# Patient Record
Sex: Male | Born: 1945
Health system: Southern US, Community
[De-identification: ages and names within clinical notes are randomized; demographics above are authoritative.]

## PROBLEM LIST (undated history)

## (undated) DIAGNOSIS — L0232 Furuncle of buttock: Secondary | ICD-10-CM

## (undated) DIAGNOSIS — I10 Essential (primary) hypertension: Secondary | ICD-10-CM

## (undated) DIAGNOSIS — Z22322 Carrier or suspected carrier of Methicillin resistant Staphylococcus aureus: Secondary | ICD-10-CM

## (undated) DIAGNOSIS — E538 Deficiency of other specified B group vitamins: Secondary | ICD-10-CM

## (undated) DIAGNOSIS — G609 Hereditary and idiopathic neuropathy, unspecified: Secondary | ICD-10-CM

## (undated) DIAGNOSIS — M1990 Unspecified osteoarthritis, unspecified site: Secondary | ICD-10-CM

## (undated) DIAGNOSIS — M549 Dorsalgia, unspecified: Secondary | ICD-10-CM

## (undated) DIAGNOSIS — Z0000 Encounter for general adult medical examination without abnormal findings: Secondary | ICD-10-CM

## (undated) DIAGNOSIS — R002 Palpitations: Secondary | ICD-10-CM

## (undated) DIAGNOSIS — G4700 Insomnia, unspecified: Secondary | ICD-10-CM

## (undated) DIAGNOSIS — G709 Myoneural disorder, unspecified: Secondary | ICD-10-CM

## (undated) DIAGNOSIS — H269 Unspecified cataract: Secondary | ICD-10-CM

## (undated) DIAGNOSIS — N400 Enlarged prostate without lower urinary tract symptoms: Secondary | ICD-10-CM

## (undated) DIAGNOSIS — Z86010 Personal history of colonic polyps: Secondary | ICD-10-CM

## (undated) DIAGNOSIS — G4730 Sleep apnea, unspecified: Secondary | ICD-10-CM

## (undated) DIAGNOSIS — I4891 Unspecified atrial fibrillation: Secondary | ICD-10-CM

## (undated) DIAGNOSIS — R109 Unspecified abdominal pain: Secondary | ICD-10-CM

## (undated) DIAGNOSIS — Q851 Tuberous sclerosis: Secondary | ICD-10-CM

## (undated) DIAGNOSIS — R079 Chest pain, unspecified: Secondary | ICD-10-CM

## (undated) DIAGNOSIS — R319 Hematuria, unspecified: Secondary | ICD-10-CM

## (undated) DIAGNOSIS — G4733 Obstructive sleep apnea (adult) (pediatric): Secondary | ICD-10-CM

## (undated) DIAGNOSIS — J309 Allergic rhinitis, unspecified: Secondary | ICD-10-CM

## (undated) DIAGNOSIS — R011 Cardiac murmur, unspecified: Secondary | ICD-10-CM

## (undated) DIAGNOSIS — I341 Nonrheumatic mitral (valve) prolapse: Secondary | ICD-10-CM

## (undated) DIAGNOSIS — E785 Hyperlipidemia, unspecified: Secondary | ICD-10-CM

## (undated) DIAGNOSIS — K222 Esophageal obstruction: Secondary | ICD-10-CM

## (undated) HISTORY — DX: Dorsalgia, unspecified: M54.9

## (undated) HISTORY — DX: Myoneural disorder, unspecified: G70.9

## (undated) HISTORY — DX: Insomnia, unspecified: G47.00

## (undated) HISTORY — PX: TONSILLECTOMY AND ADENOIDECTOMY: SUR1326

## (undated) HISTORY — DX: Unspecified cataract: H26.9

## (undated) HISTORY — DX: Nonrheumatic mitral (valve) prolapse: I34.1

## (undated) HISTORY — DX: Essential (primary) hypertension: I10

## (undated) HISTORY — DX: Furuncle of buttock: L02.32

## (undated) HISTORY — DX: Carrier or suspected carrier of methicillin resistant Staphylococcus aureus: Z22.322

## (undated) HISTORY — PX: POLYPECTOMY: SHX149

## (undated) HISTORY — PX: ATRIAL FIBRILLATION ABLATION: SHX5732

## (undated) HISTORY — PX: COLONOSCOPY: SHX174

## (undated) HISTORY — DX: Hyperlipidemia, unspecified: E78.5

## (undated) HISTORY — DX: Personal history of colonic polyps: Z86.010

## (undated) HISTORY — DX: Unspecified osteoarthritis, unspecified site: M19.90

## (undated) HISTORY — DX: Palpitations: R00.2

## (undated) HISTORY — DX: Cardiac murmur, unspecified: R01.1

## (undated) HISTORY — PX: HAND SURGERY: SHX662

## (undated) HISTORY — DX: Benign prostatic hyperplasia without lower urinary tract symptoms: N40.0

## (undated) HISTORY — DX: Esophageal obstruction: K22.2

## (undated) HISTORY — PX: UPPER GASTROINTESTINAL ENDOSCOPY: SHX188

## (undated) HISTORY — DX: Hereditary and idiopathic neuropathy, unspecified: G60.9

## (undated) HISTORY — DX: Sleep apnea, unspecified: G47.30

## (undated) HISTORY — DX: Allergic rhinitis, unspecified: J30.9

## (undated) HISTORY — DX: Encounter for general adult medical examination without abnormal findings: Z00.00

## (undated) HISTORY — DX: Obstructive sleep apnea (adult) (pediatric): G47.33

## (undated) HISTORY — DX: Unspecified atrial fibrillation: I48.91

## (undated) HISTORY — DX: Tuberous sclerosis: Q85.1

---
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
## 2004-07-30 ENCOUNTER — Encounter (INDEPENDENT_AMBULATORY_CARE_PROVIDER_SITE_OTHER): Payer: Self-pay | Admitting: Specialist

## 2004-07-30 ENCOUNTER — Ambulatory Visit (HOSPITAL_COMMUNITY): Admission: RE | Admit: 2004-07-30 | Discharge: 2004-07-30 | Payer: Self-pay | Admitting: Gastroenterology

## 2004-07-30 ENCOUNTER — Encounter: Payer: Self-pay | Admitting: Gastroenterology

## 2005-04-23 ENCOUNTER — Ambulatory Visit: Payer: Self-pay | Admitting: Family Medicine

## 2005-04-27 ENCOUNTER — Ambulatory Visit: Payer: Self-pay | Admitting: Internal Medicine

## 2006-02-15 ENCOUNTER — Ambulatory Visit: Payer: Self-pay | Admitting: Gastroenterology

## 2006-02-22 ENCOUNTER — Ambulatory Visit: Payer: Self-pay | Admitting: Gastroenterology

## 2006-07-10 ENCOUNTER — Ambulatory Visit: Payer: Self-pay | Admitting: Internal Medicine

## 2006-07-10 LAB — CONVERTED CEMR LAB: PSA: 0.82 ng/mL

## 2006-07-18 ENCOUNTER — Ambulatory Visit: Payer: Self-pay | Admitting: Internal Medicine

## 2006-12-15 ENCOUNTER — Ambulatory Visit: Payer: Self-pay | Admitting: Internal Medicine

## 2007-03-06 DIAGNOSIS — R002 Palpitations: Secondary | ICD-10-CM

## 2007-03-06 HISTORY — DX: Palpitations: R00.2

## 2007-08-17 ENCOUNTER — Ambulatory Visit: Payer: Self-pay | Admitting: Internal Medicine

## 2007-08-18 ENCOUNTER — Encounter: Payer: Self-pay | Admitting: Internal Medicine

## 2007-08-18 DIAGNOSIS — E785 Hyperlipidemia, unspecified: Secondary | ICD-10-CM

## 2007-08-18 DIAGNOSIS — I4891 Unspecified atrial fibrillation: Secondary | ICD-10-CM | POA: Insufficient documentation

## 2007-08-18 HISTORY — DX: Hyperlipidemia, unspecified: E78.5

## 2008-01-17 ENCOUNTER — Encounter: Payer: Self-pay | Admitting: Internal Medicine

## 2008-03-31 ENCOUNTER — Encounter: Payer: Self-pay | Admitting: Internal Medicine

## 2008-05-05 ENCOUNTER — Ambulatory Visit: Payer: Self-pay | Admitting: Internal Medicine

## 2008-05-05 LAB — CONVERTED CEMR LAB
ALT: 37 units/L (ref 0–53)
AST: 33 units/L (ref 0–37)
Albumin: 3.7 g/dL (ref 3.5–5.2)
Alkaline Phosphatase: 56 units/L (ref 39–117)
BUN: 16 mg/dL (ref 6–23)
Basophils Absolute: 0 10*3/uL (ref 0.0–0.1)
Bilirubin, Direct: 0.1 mg/dL (ref 0.0–0.3)
CO2: 30 meq/L (ref 19–32)
Chloride: 109 meq/L (ref 96–112)
Cholesterol: 164 mg/dL (ref 0–200)
GFR calc Af Amer: 88 mL/min
Glucose, Bld: 94 mg/dL (ref 70–99)
HDL: 32.8 mg/dL — ABNORMAL LOW (ref >39.0)
Hemoglobin: 14.5 g/dL (ref 13.0–17.0)
LDL Cholesterol: 115 mg/dL — ABNORMAL HIGH (ref 0–99)
Leukocytes, UA: NEGATIVE
MCV: 92.4 fL (ref 78.0–100.0)
Nitrite: NEGATIVE
Potassium: 4.1 meq/L (ref 3.5–5.1)
RDW: 12.6 % (ref 11.5–14.6)
Sodium: 142 meq/L (ref 135–145)
TSH: 2.11 microintl units/mL (ref 0.35–5.50)
Total Bilirubin: 0.9 mg/dL (ref 0.3–1.2)
Total CHOL/HDL Ratio: 5
Total Protein, Urine: NEGATIVE mg/dL
Total Protein: 6.7 g/dL (ref 6.0–8.3)
Urobilinogen, UA: 0.2 (ref 0.0–1.0)
VLDL: 17 mg/dL (ref 0–40)

## 2008-05-12 ENCOUNTER — Ambulatory Visit: Payer: Self-pay | Admitting: Internal Medicine

## 2008-05-12 DIAGNOSIS — G609 Hereditary and idiopathic neuropathy, unspecified: Secondary | ICD-10-CM

## 2008-05-12 DIAGNOSIS — Z860100 Personal history of colon polyps, unspecified: Secondary | ICD-10-CM | POA: Insufficient documentation

## 2008-05-12 DIAGNOSIS — J309 Allergic rhinitis, unspecified: Secondary | ICD-10-CM

## 2008-05-12 HISTORY — DX: Allergic rhinitis, unspecified: J30.9

## 2008-05-12 HISTORY — DX: Personal history of colonic polyps: Z86.010

## 2008-05-12 HISTORY — DX: Personal history of colon polyps, unspecified: Z86.0100

## 2008-05-12 HISTORY — DX: Hereditary and idiopathic neuropathy, unspecified: G60.9

## 2008-06-17 ENCOUNTER — Ambulatory Visit: Payer: Self-pay | Admitting: Internal Medicine

## 2008-06-17 LAB — CONVERTED CEMR LAB
AST: 33 units/L (ref 0–37)
Bilirubin, Direct: 0.1 mg/dL (ref 0.0–0.3)
Total Bilirubin: 0.9 mg/dL (ref 0.3–1.2)
Total CHOL/HDL Ratio: 4
Total Protein: 6.7 g/dL (ref 6.0–8.3)
Triglycerides: 60 mg/dL (ref 0–149)

## 2008-07-28 ENCOUNTER — Encounter: Payer: Self-pay | Admitting: Internal Medicine

## 2008-07-29 ENCOUNTER — Encounter: Payer: Self-pay | Admitting: Internal Medicine

## 2008-08-18 ENCOUNTER — Encounter: Payer: Self-pay | Admitting: Internal Medicine

## 2008-08-22 ENCOUNTER — Encounter: Payer: Self-pay | Admitting: Internal Medicine

## 2008-09-08 ENCOUNTER — Encounter: Payer: Self-pay | Admitting: Internal Medicine

## 2009-03-24 ENCOUNTER — Ambulatory Visit: Payer: Self-pay | Admitting: Gastroenterology

## 2009-03-24 DIAGNOSIS — K222 Esophageal obstruction: Secondary | ICD-10-CM

## 2009-03-24 DIAGNOSIS — K219 Gastro-esophageal reflux disease without esophagitis: Secondary | ICD-10-CM

## 2009-03-24 HISTORY — DX: Gastro-esophageal reflux disease without esophagitis: K21.9

## 2009-03-24 HISTORY — DX: Esophageal obstruction: K22.2

## 2009-05-12 ENCOUNTER — Ambulatory Visit: Payer: Self-pay | Admitting: Internal Medicine

## 2009-05-12 LAB — CONVERTED CEMR LAB
Albumin: 4 g/dL (ref 3.5–5.2)
Alkaline Phosphatase: 52 units/L (ref 39–117)
Basophils Relative: 0.4 % (ref 0.0–3.0)
Bilirubin Urine: NEGATIVE
Chloride: 112 meq/L (ref 96–112)
Cholesterol: 186 mg/dL (ref 0–200)
Eosinophils Absolute: 0.2 10*3/uL (ref 0.0–0.7)
GFR calc non Af Amer: 72.09 mL/min (ref >60)
HCT: 45.1 % (ref 39.0–52.0)
HDL: 41.1 mg/dL (ref >39.00)
LDL Cholesterol: 132 mg/dL — ABNORMAL HIGH (ref 0–99)
Leukocytes, UA: NEGATIVE
Lymphocytes Relative: 36 % (ref 12.0–46.0)
Lymphs Abs: 1.4 10*3/uL (ref 0.7–4.0)
MCV: 90 fL (ref 78.0–100.0)
Monocytes Absolute: 0.5 10*3/uL (ref 0.1–1.0)
Neutrophils Relative %: 44.8 % (ref 43.0–77.0)
PSA: 0.61 ng/mL (ref 0.10–4.00)
RDW: 13 % (ref 11.5–14.6)
Specific Gravity, Urine: 1.015 (ref 1.000–1.030)
TSH: 1.73 microintl units/mL (ref 0.35–5.50)
Total Bilirubin: 0.9 mg/dL (ref 0.3–1.2)
Total CHOL/HDL Ratio: 5
Total Protein, Urine: NEGATIVE mg/dL
Urine Glucose: NEGATIVE mg/dL
Urobilinogen, UA: 0.2 (ref 0.0–1.0)
pH: 6 (ref 5.0–8.0)

## 2009-05-14 ENCOUNTER — Ambulatory Visit: Payer: Self-pay | Admitting: Internal Medicine

## 2009-05-14 DIAGNOSIS — G4700 Insomnia, unspecified: Secondary | ICD-10-CM | POA: Insufficient documentation

## 2009-05-14 HISTORY — DX: Insomnia, unspecified: G47.00

## 2009-06-01 ENCOUNTER — Encounter: Payer: Self-pay | Admitting: Internal Medicine

## 2009-06-15 ENCOUNTER — Ambulatory Visit: Payer: Self-pay | Admitting: Internal Medicine

## 2009-06-15 LAB — CONVERTED CEMR LAB: Triglycerides: 73 mg/dL (ref 0.0–149.0)

## 2009-07-10 ENCOUNTER — Ambulatory Visit: Payer: Self-pay | Admitting: Internal Medicine

## 2009-08-24 ENCOUNTER — Encounter: Payer: Self-pay | Admitting: Internal Medicine

## 2009-09-01 ENCOUNTER — Encounter: Payer: Self-pay | Admitting: Internal Medicine

## 2009-10-15 ENCOUNTER — Encounter: Payer: Self-pay | Admitting: Internal Medicine

## 2010-02-10 ENCOUNTER — Encounter: Payer: Self-pay | Admitting: Internal Medicine

## 2010-03-10 ENCOUNTER — Encounter: Payer: Self-pay | Admitting: Internal Medicine

## 2010-03-13 ENCOUNTER — Emergency Department (HOSPITAL_COMMUNITY): Admission: EM | Admit: 2010-03-13 | Discharge: 2010-03-13 | Payer: Self-pay | Admitting: Emergency Medicine

## 2010-03-15 ENCOUNTER — Encounter: Payer: Self-pay | Admitting: Internal Medicine

## 2010-03-15 DIAGNOSIS — I4891 Unspecified atrial fibrillation: Secondary | ICD-10-CM

## 2010-03-15 HISTORY — DX: Unspecified atrial fibrillation: I48.91

## 2010-04-12 ENCOUNTER — Encounter: Payer: Self-pay | Admitting: Cardiovascular Disease

## 2010-05-05 ENCOUNTER — Ambulatory Visit: Payer: Self-pay | Admitting: Internal Medicine

## 2010-05-05 LAB — CONVERTED CEMR LAB
ALT: 27 units/L (ref 0–53)
BUN: 19 mg/dL (ref 6–23)
Bilirubin, Direct: 0.1 mg/dL (ref 0.0–0.3)
CO2: 30 meq/L (ref 19–32)
Calcium: 9.4 mg/dL (ref 8.4–10.5)
Chloride: 108 meq/L (ref 96–112)
Creatinine, Ser: 1.1 mg/dL (ref 0.4–1.5)
Eosinophils Absolute: 0.2 10*3/uL (ref 0.0–0.7)
GFR calc non Af Amer: 74.19 mL/min (ref >60)
HCT: 43.7 % (ref 39.0–52.0)
HDL: 39.2 mg/dL (ref >39.00)
LDL Cholesterol: 103 mg/dL — ABNORMAL HIGH (ref 0–99)
Lymphocytes Relative: 39.8 % (ref 12.0–46.0)
Lymphs Abs: 1.7 10*3/uL (ref 0.7–4.0)
MCHC: 34.7 g/dL (ref 30.0–36.0)
MCV: 89.9 fL (ref 78.0–100.0)
Monocytes Relative: 12.8 % — ABNORMAL HIGH (ref 3.0–12.0)
Neutrophils Relative %: 42.8 % — ABNORMAL LOW (ref 43.0–77.0)
PSA: 1.05 ng/mL (ref 0.10–4.00)
Platelets: 240 10*3/uL (ref 150.0–400.0)
Potassium: 4.8 meq/L (ref 3.5–5.1)
Specific Gravity, Urine: 1.015 (ref 1.000–1.030)
Total Bilirubin: 0.8 mg/dL (ref 0.3–1.2)
Total CHOL/HDL Ratio: 4
Total Protein, Urine: NEGATIVE mg/dL

## 2010-05-06 LAB — CONVERTED CEMR LAB: Vit D, 25-Hydroxy: 58 ng/mL (ref 30–89)

## 2010-05-24 ENCOUNTER — Encounter: Payer: Self-pay | Admitting: Cardiovascular Disease

## 2010-06-02 ENCOUNTER — Encounter: Payer: Self-pay | Admitting: Internal Medicine

## 2010-08-13 ENCOUNTER — Telehealth (INDEPENDENT_AMBULATORY_CARE_PROVIDER_SITE_OTHER): Payer: Self-pay | Admitting: *Deleted

## 2011-01-05 NOTE — Letter (Signed)
Summary: [facility]   Imported By: Sherian Rein 02/15/2010 08:35:36  _____________________________________________________________________  External Attachment:    Type:   Image     Comment:   External Document

## 2011-01-05 NOTE — Letter (Signed)
Summary: [facility]  [facility]   Imported By: Lanelle Bal 03/19/2010 11:36:53  _____________________________________________________________________  External Attachment:    Type:   Image     Comment:   External Document

## 2011-01-05 NOTE — Letter (Signed)
Summary: [facility]   Imported By: Sherian Rein 06/16/2010 14:16:13  _____________________________________________________________________  External Attachment:    Type:   Image     Comment:   External Document

## 2011-01-05 NOTE — Progress Notes (Signed)
  Phone Note Other Incoming   Request: Send information Summary of Call: Request for records received from [facility]. Request forwarded to [facility].     

## 2011-01-05 NOTE — Assessment & Plan Note (Signed)
Summary: 1 YR FU  $50   STC   Vital Signs:  Patient profile:   65 year old male Height:      77.5 inches Weight:      242.75 pounds BMI:     28.52 O2 Sat:      95 % on Room air Temp:     96.9 degrees F oral Pulse rate:   48 / minute BP sitting:   124 / 82  (left arm) Cuff size:   large  Vitals Entered ByZella Ball Ewing (May 05, 2010 9:59 AM)  O2 Flow:  Room air  CC: Adult Physical/RE   Primary Care Provider:  Bayard Males  CC:  Adult Physical/RE.  History of Present Illness: here for yearly exam, stopped the lovastatin in favor of the red yeast rice;  overall wt stable;  Pt denies CP, sob, doe, wheezing, orthopnea, pnd, worsening LE edema, palps, dizziness or syncope  Pt denies new neuro symptoms such as headache, facial or extremity weakness    Problems Prior to Update: 1)  Preventive Health Care  (ICD-V70.0) 2)  Insomnia-sleep Disorder-unspec  (ICD-780.52) 3)  Esophageal Stricture  (ICD-530.3) 4)  Gerd  (ICD-530.81) 5)  Personal Hx Colonic Polyps  (ICD-V12.72) 6)  Colonic Polyps, Hx of  (ICD-V12.72) 7)  Allergic Rhinitis  (ICD-477.9) 8)  Peripheral Neuropathy  (ICD-356.9) 9)  Preventive Health Care  (ICD-V70.0) 10)  Hyperlipidemia  (ICD-272.4) 11)  Fibrillation, Atrial  (ICD-427.31)  Medications Prior to Update: 1)  Zolpidem Tartrate 10 Mg Tabs (Zolpidem Tartrate) .Marland Kitchen.. 1 By Mouth At Bedtime As Needed 2)  Ecotrin 325 Mg  Tbec (Aspirin) .Marland Kitchen.. 1 By Mouth Qd 3)  Naprelan 500 Mg Xr24h-Tab (Naproxen Sodium) .Marland Kitchen.. 1 Tablet By Mouth Once Daily 4)  Aciphex 20 Mg Tbec (Rabeprazole Sodium) .... One Tablet By Mouth Once Daily 5)  Lovastatin 20 Mg Tabs (Lovastatin) .Marland Kitchen.. 1po Once Daily  Current Medications (verified): 1)  Zolpidem Tartrate 10 Mg Tabs (Zolpidem Tartrate) .Marland Kitchen.. 1 By Mouth At Bedtime As Needed 2)  Ecotrin 325 Mg  Tbec (Aspirin) .Marland Kitchen.. 1 By Mouth Qd 3)  Naprelan 500 Mg Xr24h-Tab (Naproxen Sodium) .Marland Kitchen.. 1 Tablet By Mouth Once Daily 4)  Aciphex 20 Mg Tbec (Rabeprazole  Sodium) .... One Tablet By Mouth Once Daily 5)  Metoprolol Succinate 50 Mg Xr24h-Tab (Metoprolol Succinate) .Marland Kitchen.. 1 and 1/2 By Mouth Once Daily 6)  Red Yeast Rice 600 Mg Tabs (Red Yeast Rice Extract)  Allergies (verified): 1)  * Pravastatin  Past History:  Past Medical History: Last updated: 05/14/2009 Atrial Fibrillation - paroxysmal - asa only per [facility] EP Hyperlipidemia c-spine disc disease with cervical radicultiis/DJD - ramos Peripheral neuropathy Allergic rhinitis esophageal stricture s/p dilation 2005 symptomatic PAC's mitral valve prolapse with mild MR diastoic dysfunction Adenomatous Colon Polyps 2002 lumbar spondylolisthesis/re recurrent pain - dr gross  Past Surgical History: Last updated: 05/14/2009 Tonsillectomy s/p lasik eye surgury [facility] joint repair Nov 10th 2009 - left hand  Family History: Last updated: 03/24/2009 father with heart disease sister with cancer( Melanoma) grandfather with stroke No FH of Colon Cancer:  Social History: Last updated: 03/24/2009 semi-retired - [employer], former self employed [employer] Married 3 children plus one adopted Never Smoked Alcohol use-yes occ Illicit Drug Use - no  Risk Factors: Smoking Status: never (05/12/2008)  Family History: Reviewed history from 03/24/2009 and no changes required. father with heart disease sister with cancer( Melanoma) grandfather with stroke No FH of Colon Cancer:  Social History: Reviewed  history from 03/24/2009 and no changes required. semi-retired - [employer], former self employed [employer] Married 3 children plus one adopted Never Smoked Alcohol use-yes occ Illicit Drug Use - no  Review of Systems  The patient denies anorexia, fever, weight gain, vision loss, decreased hearing, hoarseness, chest pain, syncope, dyspnea on exertion, peripheral edema, prolonged cough, headaches, hemoptysis, abdominal pain, melena, hematochezia, severe indigestion/heartburn,  hematuria, muscle weakness, suspicious skin lesions, transient blindness, difficulty walking, depression, unusual weight change, abnormal bleeding, enlarged lymph nodes, and angioedema.         all otherwise negative per pt -    Physical Exam  General:  alert and overweight-appearing.   Head:  normocephalic and atraumatic.   Eyes:  vision grossly intact, pupils equal, and pupils round.   Ears:  R ear normal and L ear normal.   Nose:  no external deformity and no nasal discharge.   Mouth:  no gingival abnormalities and pharynx pink and moist.   Neck:  supple and no masses.   Lungs:  normal respiratory effort and normal breath sounds.   Heart:  normal rate and regular rhythm.   Abdomen:  soft, non-tender, and normal bowel sounds.   Msk:  no joint tenderness and no joint swelling.   Extremities:  no edema, no erythema  Neurologic:  cranial nerves II-XII intact and strength normal in all extremities.     Impression & Recommendations:  Problem # 1:  Preventive Health Care (ICD-V70.0)  Overall doing well, age appropriate education and counseling updated and referral for appropriate preventive services done unless declined, immunizations up to date or declined, diet counseling done if overweight, urged to quit smoking if smokes , most recent labs reviewed and current ordered if appropriate, ecg reviewed or declined (interpretation per ECG scanned in the EMR if done); information regarding Medicare Prevention requirements given if appropriate; speciality referrals updated as appropriate   Orders: EKG w/ Interpretation (93000) T-Vitamin D (25-Hydroxy) (60454-09811) TLB-BMP (Basic Metabolic Panel-BMET) (80048-METABOL) TLB-CBC Platelet - w/Differential (85025-CBCD) TLB-Hepatic/Liver Function Pnl (80076-HEPATIC) TLB-Lipid Panel (80061-LIPID) TLB-TSH (Thyroid Stimulating Hormone) (84443-TSH) TLB-PSA (Prostate Specific Antigen) (84153-PSA) TLB-Udip ONLY (81003-UDIP)  Complete Medication  List: 1)  Zolpidem Tartrate 10 Mg Tabs (Zolpidem tartrate) .Marland Kitchen.. 1 by mouth at bedtime as needed 2)  Ecotrin 325 Mg Tbec (Aspirin) .Marland Kitchen.. 1 by mouth qd 3)  Naprelan 500 Mg Xr24h-tab (Naproxen sodium) .Marland Kitchen.. 1 tablet by mouth once daily 4)  Aciphex 20 Mg Tbec (Rabeprazole sodium) .... One tablet by mouth once daily 5)  Metoprolol Succinate 50 Mg Xr24h-tab (Metoprolol succinate) .Marland Kitchen.. 1 and 1/2 by mouth once daily 6)  Red Yeast Rice 600 Mg Tabs (Red yeast rice extract)  Patient Instructions: 1)  please fax your copy of the most recent LIPIDS to [phone] 2)  Please go to the Lab in the basement for your blood and/or urine tests today 3)  Continue all previous medications as before this visit  4)  Please schedule a follow-up appointment in 1 year or sooner if needed Prescriptions: ACIPHEX 20 MG TBEC (RABEPRAZOLE SODIUM) one tablet by mouth once daily  #30 x 11   Entered and Authorized by:   Corwin Levins MD   Signed by:   Corwin Levins MD on 05/05/2010   Method used:   Print then Give to Patient   RxID:   [id number] ZOLPIDEM TARTRATE 10 MG TABS (ZOLPIDEM TARTRATE) 1 by mouth at bedtime as needed  #30 x 5   Entered and Authorized  by:   Corwin Levins MD   Signed by:   Corwin Levins MD on 05/05/2010   Method used:   Print then Give to Patient   RxID:   [id number]

## 2011-01-10 ENCOUNTER — Encounter: Payer: Self-pay | Admitting: Internal Medicine

## 2011-01-17 ENCOUNTER — Other Ambulatory Visit: Payer: Self-pay | Admitting: Internal Medicine

## 2011-01-17 ENCOUNTER — Encounter (INDEPENDENT_AMBULATORY_CARE_PROVIDER_SITE_OTHER): Payer: Self-pay | Admitting: *Deleted

## 2011-01-17 ENCOUNTER — Other Ambulatory Visit: Payer: Self-pay

## 2011-01-17 DIAGNOSIS — I4891 Unspecified atrial fibrillation: Secondary | ICD-10-CM

## 2011-01-17 DIAGNOSIS — Z8249 Family history of ischemic heart disease and other diseases of the circulatory system: Secondary | ICD-10-CM

## 2011-01-17 LAB — COMPREHENSIVE METABOLIC PANEL
ALT: 24 U/L (ref 0–53)
Albumin: 3.9 g/dL (ref 3.5–5.2)
BUN: 21 mg/dL (ref 6–23)
CO2: 27 mEq/L (ref 19–32)
Calcium: 9.1 mg/dL (ref 8.4–10.5)
Creatinine, Ser: 1.1 mg/dL (ref 0.4–1.5)
Glucose, Bld: 87 mg/dL (ref 70–99)
Potassium: 4.3 mEq/L (ref 3.5–5.1)
Total Protein: 6.6 g/dL (ref 6.0–8.3)

## 2011-01-17 LAB — TSH: TSH: 1.67 u[IU]/mL (ref 0.35–5.50)

## 2011-01-17 LAB — CBC WITH DIFFERENTIAL/PLATELET
Basophils Absolute: 0 10*3/uL (ref 0.0–0.1)
Eosinophils Absolute: 0.2 10*3/uL (ref 0.0–0.7)
Eosinophils Relative: 5.1 % — ABNORMAL HIGH (ref 0.0–5.0)
HCT: 43.5 % (ref 39.0–52.0)
Lymphs Abs: 1.6 10*3/uL (ref 0.7–4.0)
MCHC: 34.3 g/dL (ref 30.0–36.0)
MCV: 90.3 fl (ref 78.0–100.0)
Neutro Abs: 1.5 10*3/uL (ref 1.4–7.7)
Neutrophils Relative %: 38.5 % — ABNORMAL LOW (ref 43.0–77.0)
RDW: 14.2 % (ref 11.5–14.6)

## 2011-01-17 LAB — LIPID PANEL
Cholesterol: 130 mg/dL (ref 0–200)
LDL Cholesterol: 77 mg/dL (ref 0–99)
VLDL: 18.4 mg/dL (ref 0.0–40.0)

## 2011-02-01 NOTE — Letter (Signed)
Summary: [facility]  [facility]   Imported By: Lennie Odor 01/26/2011 12:53:14  _____________________________________________________________________  External Attachment:    Type:   Image     Comment:   External Document

## 2011-02-23 LAB — POCT I-STAT, CHEM 8
BUN: 22 mg/dL (ref 6–23)
HCT: 51 % (ref 39.0–52.0)
Potassium: 4.4 mEq/L (ref 3.5–5.1)
Sodium: 141 mEq/L (ref 135–145)
TCO2: 27 mmol/L (ref 0–100)

## 2011-02-24 ENCOUNTER — Encounter: Payer: Self-pay | Admitting: Gastroenterology

## 2011-03-03 NOTE — Letter (Signed)
Summary: Colonoscopy Letter  [facility]  [address] [place], [place] [address]   Phone: [phone]  Fax: [phone]      February 24, 2011 MRN: [id number]   Dustin Berry [address] [place], [place]  [address]   Dear Mr. Ruehl,   According to your medical record, it is time for you to schedule a Colonoscopy. The American Cancer Society recommends this procedure as a method to detect early colon cancer. Patients with a family history of colon cancer, or a personal history of colon polyps or inflammatory bowel disease are at increased risk.  This letter has been generated based on the recommendations made at the time of your procedure. If you feel that in your particular situation this may no longer apply, please contact our office.  Please call our office at [phone] to schedule this appointment or to update your records at your earliest convenience.  Thank you for cooperating with [place] to provide you with the very best care possible.   Sincerely,  Judie Petit T. Russella Dar, M.D.  [facility] [phone]

## 2011-06-16 ENCOUNTER — Telehealth: Payer: Self-pay | Admitting: *Deleted

## 2011-06-16 NOTE — Telephone Encounter (Signed)
[facility] previsit for 06/17/11

## 2011-06-17 ENCOUNTER — Ambulatory Visit (AMBULATORY_SURGERY_CENTER): Payer: Private Health Insurance - Indemnity | Admitting: *Deleted

## 2011-06-17 VITALS — Ht 78.0 in | Wt 245.0 lb

## 2011-06-17 DIAGNOSIS — Z86010 Personal history of colonic polyps: Secondary | ICD-10-CM

## 2011-06-17 MED ORDER — PEG-KCL-NACL-NASULF-NA ASC-C 100 G PO SOLR: ORAL | Status: DC

## 2011-06-24 ENCOUNTER — Encounter: Payer: Self-pay | Admitting: Internal Medicine

## 2011-06-24 ENCOUNTER — Ambulatory Visit (INDEPENDENT_AMBULATORY_CARE_PROVIDER_SITE_OTHER): Payer: Private Health Insurance - Indemnity | Admitting: Internal Medicine

## 2011-06-24 VITALS — BP 98/64 | HR 78 | Temp 98.2°F | Ht 78.0 in | Wt 249.5 lb

## 2011-06-24 DIAGNOSIS — K219 Gastro-esophageal reflux disease without esophagitis: Secondary | ICD-10-CM

## 2011-06-24 DIAGNOSIS — Z0001 Encounter for general adult medical examination with abnormal findings: Secondary | ICD-10-CM | POA: Insufficient documentation

## 2011-06-24 DIAGNOSIS — L0231 Cutaneous abscess of buttock: Secondary | ICD-10-CM | POA: Insufficient documentation

## 2011-06-24 DIAGNOSIS — Z0000 Encounter for general adult medical examination without abnormal findings: Secondary | ICD-10-CM

## 2011-06-24 DIAGNOSIS — L0232 Furuncle of buttock: Secondary | ICD-10-CM

## 2011-06-24 HISTORY — DX: Encounter for general adult medical examination without abnormal findings: Z00.00

## 2011-06-24 HISTORY — DX: Furuncle of buttock: L02.32

## 2011-06-24 MED ORDER — DOXYCYCLINE HYCLATE 100 MG PO TABS: 100.0000 mg | ORAL_TABLET | Freq: Two times a day (BID) | ORAL | Status: AC

## 2011-06-24 NOTE — Patient Instructions (Addendum)
Take all new medications as prescribed Continue all other medications as before Please call if worsens for referral to general surgury on Monday (more pain, red, swelling, drainage, fever)

## 2011-06-24 NOTE — Progress Notes (Signed)
Subjective:    Patient ID: Dustin Berry, male    DOB: 09-Dec-1945, 65 y.o.   MRN: [id number]  HPI  Here for acute visit - c/o 3-4 day grad and radily worse pain, red, sweling to left buttock, with drainage yesterday with manual expression, none today,  No f/c, some improved today overall but still marked discomfort to sit.  Pt denies chest pain, increased sob or doe, wheezing, orthopnea, PND, increased LE swelling, palpitations, dizziness or syncope. Pt denies polydipsia, polyuria,  Past Medical History  Diagnosis Date  . Neuromuscular disorder     peripheral neuropathy  . Atrial fibrillation   . ALLERGIC RHINITIS 05/12/2008  . ESOPHAGEAL STRICTURE 03/24/2009  . HYPERLIPIDEMIA 08/18/2007  . INSOMNIA-SLEEP DISORDER-UNSPEC 05/14/2009  . PERIPHERAL NEUROPATHY 05/12/2008  . Personal history of colonic polyps 05/12/2008  . Preventative health care 06/24/2011   Past Surgical History  Procedure Date  . Hand surgery     Thumb joint repair  . Tonsillectomy and adenoidectomy   . Upper gastrointestinal endoscopy     dilation  . Colonoscopy   . Polypectomy     reports that he has never smoked. He has never used smokeless tobacco. He reports that he drinks about .6 ounces of alcohol per week. His drug history not on file. family history includes Cancer in his sister; Heart disease in his father; and Stroke in his maternal grandfather. No Known Allergies Current Outpatient Prescriptions on File Prior to Visit  Medication Sig Dispense Refill  . Ascorbic Acid (VITAMIN C WITH ROSE HIPS) 500 MG tablet Take 500 mg by mouth daily.        Marland Kitchen aspirin 325 MG tablet Take 325 mg by mouth daily.        . cholecalciferol (VITAMIN D) 1000 UNITS tablet Take 1,000 Units by mouth daily.        Marland Kitchen co-enzyme Q-10 50 MG capsule Take 50 mg by mouth daily.        . metoprolol succinate (TOPROL-XL) 25 MG 24 hr tablet Take 25 mg by mouth daily.        . peg 3350 powder (MOVIPREP) 100 G SOLR MOVI PREP take as directed  1 kit   0  . predniSONE (DELTASONE) 10 MG tablet Take 1 tablet by mouth Daily. As directed for rash tapering dose      . Probiotic Product (PROBIOTIC & ACIDOPHILUS EX ST PO) Take 1 tablet by mouth daily.        . vitamin B-12 (CYANOCOBALAMIN) 100 MCG tablet Take 50 mcg by mouth daily.        . fish oil-omega-3 fatty acids 1000 MG capsule Take 1 g by mouth daily.         Review of Systems Review of Systems  Constitutional: Negative for diaphoresis and unexpected weight change.  HENT: Negative for drooling and tinnitus.   Eyes: Negative for photophobia and visual disturbance.  Respiratory: Negative for choking and stridor.     Objective:   Physical Exam BP 98/64  Pulse 78  Temp(Src) 98.2 F (36.8 C) (Oral)  Ht 6\' 6"  (1.981 m)  Wt 249 lb 8 oz (113.172 kg)  BMI 28.83 kg/m2  SpO2 94% Physical Exam  VS noted Constitutional: Pt appears well-developed and well-nourished.  HENT: Head: Normocephalic.  Right Ear: External ear normal.  Left Ear: External ear normal.  Eyes: Conjunctivae and EOM are normal. Pupils are equal, round, and reactive to light.  Neck: Normal range of motion. Neck supple.  Cardiovascular: Normal  rate and regular rhythm.   Pulmonary/Chest: Effort normal and breath sounds normal.  Abd:  Soft, NT, non-distended, + BS Neurological: Pt is alert. No cranial nerve deficit.  Skin: Skin is warm. No erythema. except left buttock 1 cm area induration, red, tedner, swelling without fluctuance or drainage Psychiatric: Pt behavior is normal. Thought content normal.         Assessment & Plan:

## 2011-06-24 NOTE — Assessment & Plan Note (Signed)
Mild to mod, for antibx course,  to f/u any worsening symptoms or concerns 

## 2011-06-27 ENCOUNTER — Other Ambulatory Visit: Payer: Private Health Insurance - Indemnity

## 2011-06-27 ENCOUNTER — Other Ambulatory Visit: Payer: Self-pay | Admitting: Internal Medicine

## 2011-06-27 DIAGNOSIS — Z1289 Encounter for screening for malignant neoplasm of other sites: Secondary | ICD-10-CM

## 2011-06-27 DIAGNOSIS — Z0000 Encounter for general adult medical examination without abnormal findings: Secondary | ICD-10-CM

## 2011-06-30 ENCOUNTER — Ambulatory Visit (AMBULATORY_SURGERY_CENTER): Payer: Private Health Insurance - Indemnity | Admitting: Gastroenterology

## 2011-06-30 ENCOUNTER — Encounter: Payer: Self-pay | Admitting: Gastroenterology

## 2011-06-30 DIAGNOSIS — Z1211 Encounter for screening for malignant neoplasm of colon: Secondary | ICD-10-CM

## 2011-06-30 DIAGNOSIS — K648 Other hemorrhoids: Secondary | ICD-10-CM

## 2011-06-30 DIAGNOSIS — Z86010 Personal history of colonic polyps: Secondary | ICD-10-CM

## 2011-06-30 MED ORDER — SODIUM CHLORIDE 0.9 % IV SOLN: 500.0000 mL | INTRAVENOUS | Status: DC

## 2011-06-30 NOTE — Patient Instructions (Signed)
NORMAL COLONOSCOPY- REPEAT EXAM IN 5 YEARS- WE WILL MAIL YOU A LETTER TO REMIND YOU OF THIS  INTERNAL HEMORRHOID HAND OUT GIVEN  DISCHARGE INSTRUCTIONS GIVEN PER BLUE AND GREEN SHEETS PER DR Russella Dar

## 2011-07-01 ENCOUNTER — Telehealth: Payer: Self-pay

## 2011-07-01 NOTE — Telephone Encounter (Signed)
Left message on answering machine. 

## 2011-07-04 ENCOUNTER — Other Ambulatory Visit (INDEPENDENT_AMBULATORY_CARE_PROVIDER_SITE_OTHER): Payer: Private Health Insurance - Indemnity

## 2011-07-04 ENCOUNTER — Encounter: Payer: Self-pay | Admitting: Internal Medicine

## 2011-07-04 ENCOUNTER — Ambulatory Visit (INDEPENDENT_AMBULATORY_CARE_PROVIDER_SITE_OTHER): Payer: Private Health Insurance - Indemnity | Admitting: Internal Medicine

## 2011-07-04 VITALS — BP 100/70 | HR 56 | Temp 97.8°F | Ht 78.0 in | Wt 243.5 lb

## 2011-07-04 DIAGNOSIS — Z1289 Encounter for screening for malignant neoplasm of other sites: Secondary | ICD-10-CM

## 2011-07-04 DIAGNOSIS — R5383 Other fatigue: Secondary | ICD-10-CM

## 2011-07-04 DIAGNOSIS — G4733 Obstructive sleep apnea (adult) (pediatric): Secondary | ICD-10-CM

## 2011-07-04 DIAGNOSIS — L0231 Cutaneous abscess of buttock: Secondary | ICD-10-CM

## 2011-07-04 DIAGNOSIS — Z0000 Encounter for general adult medical examination without abnormal findings: Secondary | ICD-10-CM

## 2011-07-04 DIAGNOSIS — I341 Nonrheumatic mitral (valve) prolapse: Secondary | ICD-10-CM | POA: Insufficient documentation

## 2011-07-04 HISTORY — DX: Nonrheumatic mitral (valve) prolapse: I34.1

## 2011-07-04 HISTORY — DX: Obstructive sleep apnea (adult) (pediatric): G47.33

## 2011-07-04 HISTORY — DX: Other fatigue: R53.83

## 2011-07-04 LAB — HEPATIC FUNCTION PANEL
AST: 26 U/L (ref 0–37)
Alkaline Phosphatase: 53 U/L (ref 39–117)
Bilirubin, Direct: 0.1 mg/dL (ref 0.0–0.3)
Total Protein: 6.8 g/dL (ref 6.0–8.3)

## 2011-07-04 LAB — CBC WITH DIFFERENTIAL/PLATELET
Basophils Absolute: 0 10*3/uL (ref 0.0–0.1)
Basophils Relative: 0.7 % (ref 0.0–3.0)
Eosinophils Absolute: 0.3 10*3/uL (ref 0.0–0.7)
MCHC: 33.2 g/dL (ref 30.0–36.0)
MCV: 91.7 fl (ref 78.0–100.0)
Monocytes Absolute: 0.5 10*3/uL (ref 0.1–1.0)
Neutro Abs: 1.6 10*3/uL (ref 1.4–7.7)
Neutrophils Relative %: 42 % — ABNORMAL LOW (ref 43.0–77.0)
RBC: 4.86 Mil/uL (ref 4.22–5.81)
RDW: 14.1 % (ref 11.5–14.6)

## 2011-07-04 LAB — URINALYSIS
Bilirubin Urine: NEGATIVE
Ketones, ur: NEGATIVE
Leukocytes, UA: NEGATIVE
Specific Gravity, Urine: 1.02 (ref 1.000–1.030)
Total Protein, Urine: NEGATIVE
Urine Glucose: NEGATIVE
pH: 6 (ref 5.0–8.0)

## 2011-07-04 LAB — BASIC METABOLIC PANEL
Calcium: 9.2 mg/dL (ref 8.4–10.5)
Creatinine, Ser: 1.1 mg/dL (ref 0.4–1.5)
GFR: 73.13 mL/min (ref >60.00)
Sodium: 143 mEq/L (ref 135–145)

## 2011-07-04 LAB — LIPID PANEL
Cholesterol: 147 mg/dL (ref 0–200)
HDL: 36.9 mg/dL — ABNORMAL LOW (ref >39.00)
Total CHOL/HDL Ratio: 4
Triglycerides: 88 mg/dL (ref 0.0–149.0)

## 2011-07-04 MED ORDER — DOXYCYCLINE HYCLATE 100 MG PO TABS: 100.0000 mg | ORAL_TABLET | Freq: Two times a day (BID) | ORAL | Status: DC

## 2011-07-04 NOTE — Assessment & Plan Note (Signed)
Etiology unclear, Exam otherwise benign, to check labs as documented, follow with expectant management  To check testosterone level

## 2011-07-04 NOTE — Progress Notes (Signed)
Subjective:    Patient ID: Dustin Berry, male    DOB: 1946-03-04, 65 y.o.   MRN: [id number]  HPI  Here for wellness and f/u;  Overall doing ok;  Pt denies CP, worsening SOB, DOE, wheezing, orthopnea, PND, worsening LE edema, palpitations, dizziness or syncope.  Pt denies neurological change such as new Headache, facial or extremity weakness.  Pt denies polydipsia, polyuria, or low sugar symptoms. Pt states overall good compliance with treatment and medications, good tolerability, and trying to follow lower cholesterol diet.  Pt denies worsening depressive symptoms, suicidal ideation or panic. No fever, wt loss, night sweats, loss of appetite, or other constitutional symptoms.  Pt states good ability with ADL's, low fall risk, home safety reviewed and adequate, no significant changes in hearing or vision, and occasionally active with exercise.  Did have predpack for rash per Dr Tim Lair [place] the [place].  Left buttock abscess much improved. Past Medical History  Diagnosis Date  . Neuromuscular disorder     peripheral neuropathy  . Atrial fibrillation   . ALLERGIC RHINITIS 05/12/2008  . ESOPHAGEAL STRICTURE 03/24/2009  . HYPERLIPIDEMIA 08/18/2007  . INSOMNIA-SLEEP DISORDER-UNSPEC 05/14/2009  . PERIPHERAL NEUROPATHY 05/12/2008  . Personal history of colonic polyps 05/12/2008  . Preventative health care 06/24/2011  . Boil of buttock 06/24/11  . OSA (obstructive sleep apnea) 07/04/2011  . Mitral valve prolapse 07/04/2011   Past Surgical History  Procedure Date  . Hand surgery     Thumb joint repair  . Tonsillectomy and adenoidectomy   . Upper gastrointestinal endoscopy     dilation  . Colonoscopy   . Polypectomy     reports that he has never smoked. He has never used smokeless tobacco. He reports that he drinks about .6 ounces of alcohol per week. His drug history not on file. family history includes Cancer in his sister; Heart disease in his father; and Stroke in his maternal  grandfather. No Known Allergies Current Outpatient Prescriptions on File Prior to Visit  Medication Sig Dispense Refill  . Ascorbic Acid (VITAMIN C WITH ROSE HIPS) 500 MG tablet Take 500 mg by mouth daily.        Marland Kitchen aspirin 325 MG tablet Take 325 mg by mouth daily.        . cholecalciferol (VITAMIN D) 1000 UNITS tablet Take 1,000 Units by mouth daily.        Marland Kitchen co-enzyme Q-10 50 MG capsule Take 50 mg by mouth daily.        Marland Kitchen doxycycline (VIBRA-TABS) 100 MG tablet Take 1 tablet (100 mg total) by mouth 2 (two) times daily.  20 tablet  0  . fish oil-omega-3 fatty acids 1000 MG capsule Take 1 g by mouth daily.        . metoprolol succinate (TOPROL-XL) 25 MG 24 hr tablet Take 25 mg by mouth daily.        . Probiotic Product (PROBIOTIC & ACIDOPHILUS EX ST PO) Take 1 tablet by mouth daily.        . vitamin B-12 (CYANOCOBALAMIN) 100 MCG tablet Take 50 mcg by mouth daily.         Current Facility-Administered Medications on File Prior to Visit  Medication Dose Route Frequency Provider Last Rate Last Dose  . 0.9 %  sodium chloride infusion  500 mL Intravenous Continuous Meryl Dare, MD,FACG       Review of Systems Review of Systems  Constitutional: Negative for diaphoresis, activity change, appetite change and unexpected  weight change.  HENT: Negative for hearing loss, ear pain, facial swelling, mouth sores and neck stiffness.   Eyes: Negative for pain, redness and visual disturbance.  Respiratory: Negative for shortness of breath and wheezing.   Cardiovascular: Negative for chest pain and palpitations.  Gastrointestinal: Negative for diarrhea, blood in stool, abdominal distention and rectal pain.  Genitourinary: Negative for hematuria, flank pain and decreased urine volume.  Musculoskeletal: Negative for myalgias and joint swelling.  Skin: Negative for color change and wound.  Neurological: Negative for syncope and numbness.  Hematological: Negative for adenopathy.  Psychiatric/Behavioral:  Negative for hallucinations, self-injury, decreased concentration and agitation.      Objective:   Physical Exam BP 100/70  Pulse 56  Temp(Src) 97.8 F (36.6 C) (Oral)  Ht 6\' 6"  (1.981 m)  Wt 243 lb 8 oz (110.451 kg)  BMI 28.14 kg/m2  SpO2 97% Physical Exam  VS noted Constitutional: Pt is oriented to person, place, and time. Appears well-developed and well-nourished.  HENT:  Head: Normocephalic and atraumatic.  Right Ear: External ear normal.  Left Ear: External ear normal.  Nose: Nose normal.  Mouth/Throat: Oropharynx is clear and moist.  Eyes: Conjunctivae and EOM are normal. Pupils are equal, round, and reactive to light.  Neck: Normal range of motion. Neck supple. No JVD present. No tracheal deviation present.  Cardiovascular: Normal rate, regular rhythm, normal heart sounds and intact distal pulses.   Pulmonary/Chest: Effort normal and breath sounds normal.  Abdominal: Soft. Bowel sounds are normal. There is no tenderness.  Musculoskeletal: Normal range of motion. Exhibits no edema.  Lymphadenopathy:  Has no cervical adenopathy.  Neurological: Pt is alert and oriented to person, place, and time. Pt has normal reflexes. No cranial nerve deficit.  Skin: Skin is warm and dry. No rash noted. left buttock abscess much improved but still 1 cm induration and mild tender,red; no drainage Psychiatric:  Has  normal mood and affect. Behavior is normal.         Assessment & Plan:

## 2011-07-04 NOTE — Assessment & Plan Note (Signed)
PAF, two "attacks" in the past 10 yrs, followed per Dr Tresa Endo, on ASA only

## 2011-07-04 NOTE — Assessment & Plan Note (Signed)

## 2011-07-04 NOTE — Progress Notes (Signed)
Quick Note:  Voice message left on [employer] system - lab is negative, normal or otherwise stable, pt to continue same tx ______ 

## 2011-07-04 NOTE — Patient Instructions (Addendum)
Please go to LAB in the Basement for the blood and/or urine tests to be done today (including the testosterone) Please call the phone number [phone] (the [employer] System) for results of testing in 2-3 days;  When calling, simply dial the number, and when prompted enter the MRN number above (the Medical Record Number) and the # key, then the message should start. Take all new medications as prescribed - the repeat antibiotic Continue all other medications as before Please return in 1 year for your yearly visit, or sooner if needed, with Lab testing done 3-5 days before

## 2011-07-04 NOTE — Assessment & Plan Note (Signed)
Much improved but still with signicant induaration - for repeat antibx course,  to f/u any worsening symptoms or concerns

## 2011-07-05 LAB — TESTOSTERONE, FREE, TOTAL, SHBG: Testosterone: 351.18 ng/dL (ref 250–890)

## 2011-11-15 ENCOUNTER — Other Ambulatory Visit: Payer: Self-pay

## 2011-11-15 MED ORDER — DOXYCYCLINE HYCLATE 100 MG PO TABS: 100.0000 mg | ORAL_TABLET | Freq: Two times a day (BID) | ORAL | Status: AC

## 2011-11-15 NOTE — Telephone Encounter (Signed)
Patient is requesting a refill on antibiotic as has infection on his temple as before.

## 2011-11-15 NOTE — Telephone Encounter (Signed)
Done per emr 

## 2011-11-16 NOTE — Telephone Encounter (Signed)
Called left message prescription requested has been sent to pharmacy.

## 2012-03-09 ENCOUNTER — Telehealth: Payer: Self-pay

## 2012-03-09 DIAGNOSIS — Z0000 Encounter for general adult medical examination without abnormal findings: Secondary | ICD-10-CM

## 2012-03-09 NOTE — Telephone Encounter (Signed)
Put lab order in. 

## 2012-07-02 ENCOUNTER — Other Ambulatory Visit (INDEPENDENT_AMBULATORY_CARE_PROVIDER_SITE_OTHER): Payer: Private Health Insurance - Indemnity

## 2012-07-02 DIAGNOSIS — Z0000 Encounter for general adult medical examination without abnormal findings: Secondary | ICD-10-CM

## 2012-07-02 LAB — URINALYSIS, ROUTINE W REFLEX MICROSCOPIC
Ketones, ur: NEGATIVE
Leukocytes, UA: NEGATIVE
Nitrite: NEGATIVE
Specific Gravity, Urine: 1.015 (ref 1.000–1.030)
pH: 7.5 (ref 5.0–8.0)

## 2012-07-02 LAB — CBC WITH DIFFERENTIAL/PLATELET
Basophils Relative: 0.5 % (ref 0.0–3.0)
Eosinophils Absolute: 0.2 10*3/uL (ref 0.0–0.7)
HCT: 46.2 % (ref 39.0–52.0)
Hemoglobin: 15.4 g/dL (ref 13.0–17.0)
Monocytes Absolute: 0.6 10*3/uL (ref 0.1–1.0)
WBC: 5.3 10*3/uL (ref 4.5–10.5)

## 2012-07-02 LAB — BASIC METABOLIC PANEL
BUN: 17 mg/dL (ref 6–23)
GFR: 71.37 mL/min (ref >60.00)
Potassium: 4.2 mEq/L (ref 3.5–5.1)

## 2012-07-02 LAB — TSH: TSH: 2.27 u[IU]/mL (ref 0.35–5.50)

## 2012-07-02 LAB — HEPATIC FUNCTION PANEL
AST: 24 U/L (ref 0–37)
Total Bilirubin: 0.7 mg/dL (ref 0.3–1.2)

## 2012-07-02 LAB — LIPID PANEL: Cholesterol: 168 mg/dL (ref 0–200)

## 2012-07-09 ENCOUNTER — Encounter: Payer: Private Health Insurance - Indemnity | Admitting: Internal Medicine

## 2012-07-09 DIAGNOSIS — Z0289 Encounter for other administrative examinations: Secondary | ICD-10-CM

## 2012-09-04 ENCOUNTER — Other Ambulatory Visit: Payer: Self-pay | Admitting: Internal Medicine

## 2012-09-05 ENCOUNTER — Ambulatory Visit (INDEPENDENT_AMBULATORY_CARE_PROVIDER_SITE_OTHER): Payer: Managed Care, Other (non HMO) | Admitting: Internal Medicine

## 2012-09-05 ENCOUNTER — Encounter: Payer: Self-pay | Admitting: Internal Medicine

## 2012-09-05 VITALS — BP 108/78 | HR 60 | Temp 97.6°F | Ht 78.0 in | Wt 246.0 lb

## 2012-09-05 DIAGNOSIS — I4891 Unspecified atrial fibrillation: Secondary | ICD-10-CM

## 2012-09-05 DIAGNOSIS — L0390 Cellulitis, unspecified: Secondary | ICD-10-CM

## 2012-09-05 DIAGNOSIS — H9319 Tinnitus, unspecified ear: Secondary | ICD-10-CM

## 2012-09-05 DIAGNOSIS — N400 Enlarged prostate without lower urinary tract symptoms: Secondary | ICD-10-CM

## 2012-09-05 DIAGNOSIS — Z0000 Encounter for general adult medical examination without abnormal findings: Secondary | ICD-10-CM

## 2012-09-05 DIAGNOSIS — Z23 Encounter for immunization: Secondary | ICD-10-CM

## 2012-09-05 DIAGNOSIS — L0291 Cutaneous abscess, unspecified: Secondary | ICD-10-CM | POA: Insufficient documentation

## 2012-09-05 HISTORY — DX: Tinnitus, unspecified ear: H93.19

## 2012-09-05 HISTORY — DX: Benign prostatic hyperplasia without lower urinary tract symptoms: N40.0

## 2012-09-05 MED ORDER — TAMSULOSIN HCL 0.4 MG PO CAPS: 0.4000 mg | ORAL_CAPSULE | Freq: Every day | ORAL | Status: DC

## 2012-09-05 MED ORDER — PREDNISONE 10 MG PO TABS: 10.0000 mg | ORAL_TABLET | Freq: Every day | ORAL | Status: DC

## 2012-09-05 MED ORDER — MUPIROCIN 2 % EX OINT: TOPICAL_OINTMENT | CUTANEOUS | Status: DC

## 2012-09-05 MED ORDER — APIXABAN 5 MG PO TABS: 5.0000 mg | ORAL_TABLET | Freq: Two times a day (BID) | ORAL | Status: DC

## 2012-09-05 MED ORDER — DOXYCYCLINE HYCLATE 100 MG PO TABS: 100.0000 mg | ORAL_TABLET | Freq: Two times a day (BID) | ORAL | Status: DC

## 2012-09-05 MED ORDER — SODIUM CHLORIDE 0.9 % IJ SOLN: 3.0000 mL | INTRAMUSCULAR | Status: DC | PRN

## 2012-09-05 NOTE — Progress Notes (Signed)
Subjective:    Patient ID: Dustin Berry, male    DOB: [birth year], 66 y.o.   MRN: [id number]  HPI  Here for wellness and f/u;  Overall doing ok;  Pt denies CP, worsening SOB, DOE, wheezing, orthopnea, PND, worsening LE edema, palpitations, dizziness or syncope.  Pt denies neurological change such as new Headache, facial or extremity weakness.  Pt denies polydipsia, polyuria, or low sugar symptoms. Pt states overall good compliance with treatment and medications, good tolerability, and trying to follow lower cholesterol diet.  Pt denies worsening depressive symptoms, suicidal ideation or panic. No fever, wt loss, night sweats, loss of appetite, or other constitutional symptoms.  Pt states good ability with ADL's, low fall risk, home safety reviewed and adequate, no significant changes in hearing or vision, and occasionally active with exercise and No longer plyaing bball very much.  Has cervical and lumbar disease s/p several ESI.  Does also have recurrent skin abscess, presumed MRSA, today with small left axillary abscess today without fever or drainage.  Also with gradually worsening urinary slower flow and freq related to unable to urinate completely. Past Medical History  Diagnosis Date  . Neuromuscular disorder     peripheral neuropathy  . Atrial fibrillation   . ALLERGIC RHINITIS 05/12/2008  . ESOPHAGEAL STRICTURE 03/24/2009  . HYPERLIPIDEMIA 08/18/2007  . INSOMNIA-SLEEP DISORDER-UNSPEC 05/14/2009  . PERIPHERAL NEUROPATHY 05/12/2008  . Personal history of colonic polyps 05/12/2008  . Preventative health care 06/24/2011  . Boil of buttock 06/24/11  . OSA (obstructive sleep apnea) 07/04/2011  . Mitral valve prolapse 07/04/2011  . BPH (benign prostatic hypertrophy) 09/05/2012   Past Surgical History  Procedure Date  . Hand surgery     Thumb joint repair  . Tonsillectomy and adenoidectomy   . Upper gastrointestinal endoscopy     dilation  . Colonoscopy   . Polypectomy     reports that he has  never smoked. He has never used smokeless tobacco. He reports that he drinks about .6 ounces of alcohol per week. His drug history not on file. family history includes Cancer in his sister; Heart disease in his father; and Stroke in his maternal grandfather. No Known Allergies Current Outpatient Prescriptions on File Prior to Visit  Medication Sig Dispense Refill  . Ascorbic Acid (VITAMIN C WITH ROSE HIPS) 500 MG tablet Take 500 mg by mouth daily.        . cholecalciferol (VITAMIN D) 1000 UNITS tablet Take 1,000 Units by mouth daily.        Marland Kitchen co-enzyme Q-10 50 MG capsule Take 50 mg by mouth daily.        . fish oil-omega-3 fatty acids 1000 MG capsule Take 1 g by mouth daily.        . metoprolol succinate (TOPROL-XL) 25 MG 24 hr tablet Take 25 mg by mouth daily.        . Probiotic Product (PROBIOTIC & ACIDOPHILUS EX ST PO) Take 1 tablet by mouth daily.        . vitamin B-12 (CYANOCOBALAMIN) 100 MCG tablet Take 50 mcg by mouth daily.        Marland Kitchen apixaban (ELIQUIS) 5 MG TABS tablet Take 1 tablet (5 mg total) by mouth 2 (two) times daily.  180 tablet  3   Current Facility-Administered Medications on File Prior to Visit  Medication Dose Route Frequency Provider Last Rate Last Dose  . DISCONTD: 0.9 %  sodium chloride infusion  500 mL Intravenous Continuous Meryl Dare, MD,FACG      .  DISCONTD: sodium chloride 0.9 % injection 3 mL  3 mL Intravenous PRN Chrystie Nose, MD       Review of Systems Review of Systems  Constitutional: Negative for diaphoresis, activity change, appetite change and unexpected weight change.  HENT: Negative for hearing loss, ear pain, facial swelling, mouth sores and neck stiffness.  but has ongoing tinnitus for several months Eyes: Negative for pain, redness and visual disturbance.  Respiratory: Negative for shortness of breath and wheezing.   Cardiovascular: Negative for chest pain and palpitations.  Gastrointestinal: Negative for diarrhea, blood in stool, abdominal  distention and rectal pain.  Genitourinary: Negative for hematuria, flank pain and decreased urine volume.  Musculoskeletal: Negative for myalgias and joint swelling.  Skin: Negative for color change and wound.  Neurological: Negative for syncope and numbness.  Hematological: Negative for adenopathy.  Psychiatric/Behavioral: Negative for hallucinations, self-injury, decreased concentration and agitation.     Objective:   Physical Exam BP 108/78  Pulse 60  Temp 97.6 F (36.4 C) (Oral)  Ht 6\' 6"  (1.981 m)  Wt 246 lb (111.585 kg)  BMI 28.43 kg/m2  SpO2 93% Physical Exam  VS noted Constitutional: Pt is oriented to person, place, and time. Appears well-developed and well-nourished.  HENT:  Head: Normocephalic and atraumatic.  Right Ear: External ear normal.  Left Ear: External ear normal.  Nose: Nose normal.  Mouth/Throat: Oropharynx is clear and moist.  Eyes: Conjunctivae and EOM are normal. Pupils are equal, round, and reactive to light.  Neck: Normal range of motion. Neck supple. No JVD present. No tracheal deviation present.  Cardiovascular: Normal rate, regular rhythm, normal heart sounds and intact distal pulses.   Pulmonary/Chest: Effort normal and breath sounds normal.  Abdominal: Soft. Bowel sounds are normal. There is no tenderness.  Musculoskeletal: Normal range of motion. Exhibits no edema.  Lymphadenopathy:  Has no cervical adenopathy.  Neurological: Pt is alert and oriented to person, place, and time. Pt has normal reflexes. No cranial nerve deficit.  Skin: Skin is warm and dry. No rash noted. small tender < 1 cm superficial abscess noted, nondrainage left axillary Psychiatric:  Has  normal mood and affect. Behavior is normal.     Assessment & Plan:

## 2012-09-05 NOTE — Patient Instructions (Addendum)
You had flu shot today No B12 shots today! You had the pneumonia shot today Take all new medications as prescribed - the mupirocin for nasal treatment, and the course of doxycycline, as well as the generic flomax to help with the prostate Continue all other medications as before Please have the pharmacy call with any refills you may need Please continue your efforts at being more active, low cholesterol diet, and weight control. You are otherwise up to date with prevention measures Please keep your appointments with your specialists as you have planned - Dr Tresa Endo, your dermatologist, and surgeon You will be contacted regarding the referral for: ENT to look into the Tinnitus Please return in 1 year for your yearly visit, or sooner if needed, with Lab testing done 3-5 days before

## 2012-09-05 NOTE — Assessment & Plan Note (Signed)

## 2012-09-05 NOTE — Assessment & Plan Note (Signed)
Now on eliquis for 30 days per Dr Kelly/card with recent episode over a weekend,  to f/u any worsening symptoms or concerns

## 2012-09-05 NOTE — Assessment & Plan Note (Signed)
With mild outflow/retention symptoms - for flomax trial

## 2012-09-05 NOTE — Assessment & Plan Note (Signed)
?   Etiology - for ENT referral

## 2012-09-05 NOTE — Assessment & Plan Note (Signed)
Recurrant, now small to left axilla, presumed mrsa - for mupirocin bid x 5 days, and doxy course

## 2012-09-13 ENCOUNTER — Ambulatory Visit (HOSPITAL_COMMUNITY)
Admission: RE | Admit: 2012-09-13 | Payer: Managed Care, Other (non HMO) | Source: Ambulatory Visit | Admitting: Internal Medicine

## 2012-09-13 ENCOUNTER — Encounter (HOSPITAL_COMMUNITY): Admission: RE | Payer: Self-pay | Source: Ambulatory Visit

## 2012-09-13 SURGERY — CARDIOVERSION
Anesthesia: Monitor Anesthesia Care

## 2012-09-27 ENCOUNTER — Other Ambulatory Visit: Payer: Self-pay | Admitting: Dermatology

## 2012-10-01 ENCOUNTER — Ambulatory Visit: Payer: Managed Care, Other (non HMO) | Attending: Otolaryngology | Admitting: Audiology

## 2012-10-01 DIAGNOSIS — H903 Sensorineural hearing loss, bilateral: Secondary | ICD-10-CM | POA: Insufficient documentation

## 2012-10-15 ENCOUNTER — Encounter: Payer: Self-pay | Admitting: Internal Medicine

## 2012-10-21 DIAGNOSIS — I4891 Unspecified atrial fibrillation: Secondary | ICD-10-CM

## 2012-10-21 HISTORY — DX: Unspecified atrial fibrillation: I48.91

## 2012-10-22 ENCOUNTER — Other Ambulatory Visit: Payer: Self-pay | Admitting: Internal Medicine

## 2012-10-23 ENCOUNTER — Other Ambulatory Visit: Payer: Self-pay | Admitting: Internal Medicine

## 2013-02-12 ENCOUNTER — Ambulatory Visit (INDEPENDENT_AMBULATORY_CARE_PROVIDER_SITE_OTHER): Payer: Managed Care, Other (non HMO) | Admitting: Internal Medicine

## 2013-02-12 ENCOUNTER — Encounter: Payer: Self-pay | Admitting: Internal Medicine

## 2013-02-12 VITALS — BP 138/86 | HR 85 | Temp 97.9°F | Ht 78.0 in | Wt 249.0 lb

## 2013-02-12 DIAGNOSIS — L0291 Cutaneous abscess, unspecified: Secondary | ICD-10-CM

## 2013-02-12 DIAGNOSIS — N400 Enlarged prostate without lower urinary tract symptoms: Secondary | ICD-10-CM

## 2013-02-12 MED ORDER — CEFTRIAXONE SODIUM 1 G IJ SOLR
1.0000 g | Freq: Once | INTRAMUSCULAR | Status: AC
  Administered 2013-02-12: 1 g via INTRAMUSCULAR

## 2013-02-12 MED ORDER — METHYLPREDNISOLONE ACETATE 80 MG/ML IJ SUSP
80.0000 mg | Freq: Once | INTRAMUSCULAR | Status: AC
  Administered 2013-02-12: 80 mg via INTRAMUSCULAR

## 2013-02-12 MED ORDER — DOXYCYCLINE HYCLATE 100 MG PO TABS: 100.0000 mg | ORAL_TABLET | Freq: Two times a day (BID) | ORAL | Status: DC

## 2013-02-12 MED ORDER — TAMSULOSIN HCL 0.4 MG PO CAPS: 0.4000 mg | ORAL_CAPSULE | Freq: Every day | ORAL | Status: DC

## 2013-02-12 NOTE — Patient Instructions (Signed)
Cellulitis Cellulitis is an infection of the skin and the tissue beneath it. The infected area is usually red and tender. Cellulitis occurs most often in the arms and lower legs.   CAUSES   Cellulitis is caused by bacteria that enter the skin through cracks or cuts in the skin. The most common types of bacteria that cause cellulitis are Staphylococcus and Streptococcus. SYMPTOMS    Redness and warmth.   Swelling.   Tenderness or pain.   Fever.  DIAGNOSIS  Your caregiver can usually determine what is wrong based on a physical exam. Blood tests may also be done. TREATMENT   Treatment usually involves taking an antibiotic medicine. HOME CARE INSTRUCTIONS    Take your antibiotics as directed. Finish them even if you start to feel better.   Keep the infected arm or leg elevated to reduce swelling.   Apply a warm cloth to the affected area up to 4 times per day to relieve pain.   Only take over-the-counter or prescription medicines for pain, discomfort, or fever as directed by your caregiver.   Keep all follow-up appointments as directed by your caregiver.  SEEK MEDICAL CARE IF:    You notice red streaks coming from the infected area.   Your red area gets larger or turns dark in color.   Your bone or joint underneath the infected area becomes painful after the skin has healed.   Your infection returns in the same area or another area.   You notice a swollen bump in the infected area.   You develop new symptoms.  SEEK IMMEDIATE MEDICAL CARE IF:    You have a fever.   You feel very sleepy.   You develop vomiting or diarrhea.   You have a general ill feeling (malaise) with muscle aches and pains.  MAKE SURE YOU:    Understand these instructions.   Will watch your condition.   Will get help right away if you are not doing well or get worse.  Document Released: 08/31/2005 Document Revised: 05/22/2012 Document Reviewed: 02/06/2012 [employer] Patient Information 2013  [employer]    

## 2013-02-12 NOTE — Progress Notes (Signed)
Subjective:    Patient ID: Dustin Berry, male    DOB: [birth year], 67 y.o.   MRN: [id number]  HPI  Pt presents to the clinic today with c/o of redness and swelling of the right pointer finger. He was camping out this past weekend for a boy scouts retreat. He killed quit a few spiders in his tent. He is unsure of wether or not he may have been bit by a spider in the middle of the night. On Saturday, he noticed the swelling and redness start to increase. He went to an Urgent Care clinic on Sunday where they diagnosed him with cellulitis and started him on doxycycline. He has noticed now that the redness is starting to move up his arm. The area is tender. He has not taken anything for the pain. He has never had anything like this before. Additionally today, he would like a refill of his flomax. He has been doing well on the medication and tolerating it well without side effects.  Review of Systems      Past Medical History  Diagnosis Date  . Neuromuscular disorder     peripheral neuropathy  . Atrial fibrillation   . ALLERGIC RHINITIS 05/12/2008  . ESOPHAGEAL STRICTURE 03/24/2009  . HYPERLIPIDEMIA 08/18/2007  . INSOMNIA-SLEEP DISORDER-UNSPEC 05/14/2009  . PERIPHERAL NEUROPATHY 05/12/2008  . Personal history of colonic polyps 05/12/2008  . Preventative health care 06/24/2011  . Boil of buttock 06/24/11  . OSA (obstructive sleep apnea) 07/04/2011  . Mitral valve prolapse 07/04/2011  . BPH (benign prostatic hypertrophy) 09/05/2012    Current Outpatient Prescriptions  Medication Sig Dispense Refill  . doxycycline (VIBRA-TABS) 100 MG tablet Take 1 tablet (100 mg total) by mouth 2 (two) times daily.  20 tablet  0  . tamsulosin (FLOMAX) 0.4 MG CAPS Take 1 capsule (0.4 mg total) by mouth daily.  90 capsule  3  . Ascorbic Acid (VITAMIN C WITH ROSE HIPS) 500 MG tablet Take 500 mg by mouth daily.        . cholecalciferol (VITAMIN D) 1000 UNITS tablet Take 1,000 Units by mouth daily.        Marland Kitchen co-enzyme  Q-10 50 MG capsule Take 50 mg by mouth daily.        . fish oil-omega-3 fatty acids 1000 MG capsule Take 1 g by mouth daily.        . metoprolol succinate (TOPROL-XL) 25 MG 24 hr tablet Take 25 mg by mouth daily.        . mupirocin ointment (BACTROBAN) 2 % Use twice per day to both nares for 5 days  22 g  0  . Probiotic Product (PROBIOTIC & ACIDOPHILUS EX ST PO) Take 1 tablet by mouth daily.        . vitamin B-12 (CYANOCOBALAMIN) 100 MCG tablet Take 50 mcg by mouth daily.         No current facility-administered medications for this visit.    No Known Allergies  Family History  Problem Relation Age of Onset  . Heart disease Father   . Cancer Sister     Melanoma  . Stroke Maternal Grandfather     History   Social History  . Marital Status: Married    Spouse Name: N/A    Number of Children: N/A  . Years of Education: N/A   Occupational History  . semi-retired realestate, former self employed tech co support    Social History Main Topics  . Smoking status: Never Smoker   .  Smokeless tobacco: Never Used  . Alcohol Use: 0.6 oz/week    1 Glasses of wine per week  . Drug Use: Not on file  . Sexually Active: Not on file   Other Topics Concern  . Not on file   Social History Narrative  . No narrative on file     Constitutional: Denies fever, malaise, fatigue, headache or abrupt weight changes. . Musculoskeletal: Pt reports pain in right pointer finger. Denies decrease in range of motion, difficulty with gait, muscle pain or joint swelling.  Skin:  Area of redness and swelling on right pointer finger. Denies rashes, lesions or ulcercations.     No other specific complaints in a complete review of systems (except as listed in HPI above).  Objective:   Physical Exam  BP 138/86  Pulse 85  Temp(Src) 97.9 F (36.6 C) (Oral)  Ht 6\' 6"  (1.981 m)  Wt 249 lb (112.946 kg)  BMI 28.78 kg/m2  SpO2 95% Wt Readings from Last 3 Encounters:  02/12/13 249 lb (112.946 kg)   09/05/12 246 lb (111.585 kg)  07/04/11 243 lb 8 oz (110.451 kg)    General: Appears his stated age, well developed, well nourished in NAD. Skin: 4 x 4 cm area of redness and swelling noted on the right pointer finger, with streaking up to the wrist. No rashes, lesions or ulcerations noted. Cardiovascular: Normal rate and rhythm. S1,S2 noted.  No murmur, rubs or gallops noted. No JVD or BLE edema. No carotid bruits noted. Pulmonary/Chest: Normal effort and positive vesicular breath sounds. No respiratory distress. No wheezes, rales or ronchi noted.  Musculoskeletal: Normal range of motion. No signs of joint swelling. No difficulty with gait.         Assessment & Plan:   Cellulitis of the pointer finger on the right hand:  Continue doxycycline as prescribed 80 mg Depo IM now 1 g Rocephin IM now  BPH:  Well controlled on current therapy Refilled flomax today  RTC if you notice that the swelling spreads or extends further up the arm, if the area becomes painful or the redness extends up the arm

## 2013-04-01 ENCOUNTER — Encounter (HOSPITAL_COMMUNITY): Payer: Self-pay | Admitting: Cardiology

## 2013-04-01 ENCOUNTER — Inpatient Hospital Stay (HOSPITAL_COMMUNITY): Payer: Managed Care, Other (non HMO)

## 2013-04-01 ENCOUNTER — Inpatient Hospital Stay (HOSPITAL_COMMUNITY)
Admission: AD | Admit: 2013-04-01 | Discharge: 2013-04-04 | DRG: 310 | Disposition: A | Discharge status: Home / Self Care | Payer: Managed Care, Other (non HMO) | Source: Ambulatory Visit | Attending: Cardiovascular Disease | Admitting: Cardiovascular Disease

## 2013-04-01 DIAGNOSIS — Z860100 Personal history of colon polyps, unspecified: Secondary | ICD-10-CM

## 2013-04-01 DIAGNOSIS — I059 Rheumatic mitral valve disease, unspecified: Secondary | ICD-10-CM | POA: Diagnosis present

## 2013-04-01 DIAGNOSIS — I4891 Unspecified atrial fibrillation: Principal | ICD-10-CM

## 2013-04-01 DIAGNOSIS — I341 Nonrheumatic mitral (valve) prolapse: Secondary | ICD-10-CM | POA: Diagnosis present

## 2013-04-01 DIAGNOSIS — R109 Unspecified abdominal pain: Secondary | ICD-10-CM | POA: Diagnosis present

## 2013-04-01 DIAGNOSIS — G4733 Obstructive sleep apnea (adult) (pediatric): Secondary | ICD-10-CM | POA: Diagnosis present

## 2013-04-01 DIAGNOSIS — R079 Chest pain, unspecified: Secondary | ICD-10-CM | POA: Diagnosis present

## 2013-04-01 DIAGNOSIS — Z79899 Other long term (current) drug therapy: Secondary | ICD-10-CM

## 2013-04-01 DIAGNOSIS — Z7982 Long term (current) use of aspirin: Secondary | ICD-10-CM

## 2013-04-01 DIAGNOSIS — E785 Hyperlipidemia, unspecified: Secondary | ICD-10-CM | POA: Diagnosis present

## 2013-04-01 DIAGNOSIS — G609 Hereditary and idiopathic neuropathy, unspecified: Secondary | ICD-10-CM | POA: Diagnosis present

## 2013-04-01 DIAGNOSIS — R319 Hematuria, unspecified: Secondary | ICD-10-CM | POA: Diagnosis present

## 2013-04-01 HISTORY — DX: Hematuria, unspecified: R31.9

## 2013-04-01 HISTORY — DX: Unspecified abdominal pain: R10.9

## 2013-04-01 HISTORY — DX: Chest pain, unspecified: R07.9

## 2013-04-01 HISTORY — DX: Unspecified atrial fibrillation: I48.91

## 2013-04-01 LAB — CBC WITH DIFFERENTIAL/PLATELET
Eosinophils Absolute: 0.1 10*3/uL (ref 0.0–0.7)
Eosinophils Relative: 2 % (ref 0–5)
Lymphs Abs: 2.1 10*3/uL (ref 0.7–4.0)
MCH: 29.1 pg (ref 26.0–34.0)
MCV: 84 fL (ref 78.0–100.0)
Monocytes Absolute: 0.7 10*3/uL (ref 0.1–1.0)
Monocytes Relative: 12 % (ref 3–12)
Platelets: 206 10*3/uL (ref 150–400)
RBC: 4.7 MIL/uL (ref 4.22–5.81)

## 2013-04-01 LAB — URINALYSIS, ROUTINE W REFLEX MICROSCOPIC
Ketones, ur: NEGATIVE mg/dL
Leukocytes, UA: NEGATIVE
Nitrite: NEGATIVE
Protein, ur: NEGATIVE mg/dL
Urobilinogen, UA: 0.2 mg/dL (ref 0.0–1.0)

## 2013-04-01 LAB — PRO B NATRIURETIC PEPTIDE: Pro B Natriuretic peptide (BNP): 1933 pg/mL — ABNORMAL HIGH (ref 0–125)

## 2013-04-01 LAB — COMPREHENSIVE METABOLIC PANEL
ALT: 26 U/L (ref 0–53)
AST: 20 U/L (ref 0–37)
CO2: 25 mEq/L (ref 19–32)
Calcium: 8.6 mg/dL (ref 8.4–10.5)
Chloride: 108 mEq/L (ref 96–112)
GFR calc non Af Amer: 54 mL/min — ABNORMAL LOW (ref >90)
Sodium: 140 mEq/L (ref 135–145)

## 2013-04-01 LAB — TROPONIN I
Troponin I: <0.3 ng/mL (ref <0.30)
Troponin I: <0.3 ng/mL (ref <0.30)

## 2013-04-01 LAB — URINE MICROSCOPIC-ADD ON

## 2013-04-01 LAB — APTT: aPTT: 41 seconds — ABNORMAL HIGH (ref 24–37)

## 2013-04-01 MED ORDER — SODIUM CHLORIDE 0.9 % IJ SOLN
3.0000 mL | Freq: Two times a day (BID) | INTRAMUSCULAR | Status: DC
  Administered 2013-04-02 – 2013-04-04 (×3): 3 mL via INTRAVENOUS

## 2013-04-01 MED ORDER — DILTIAZEM HCL 100 MG IV SOLR
10.0000 mg/h | INTRAVENOUS | Status: DC
  Administered 2013-04-01 – 2013-04-04 (×5): 10 mg/h via INTRAVENOUS
  Filled 2013-04-01 (×7): qty 100

## 2013-04-01 MED ORDER — VITAMIN C 500 MG PO TABS
500.0000 mg | ORAL_TABLET | Freq: Every day | ORAL | Status: DC
  Administered 2013-04-01 – 2013-04-04 (×4): 500 mg via ORAL
  Filled 2013-04-01 (×4): qty 1

## 2013-04-01 MED ORDER — METOPROLOL SUCCINATE ER 50 MG PO TB24
50.0000 mg | ORAL_TABLET | Freq: Every day | ORAL | Status: DC
  Administered 2013-04-01 – 2013-04-04 (×3): 50 mg via ORAL
  Filled 2013-04-01 (×4): qty 1

## 2013-04-01 MED ORDER — ACETAMINOPHEN 325 MG PO TABS: 650.0000 mg | ORAL_TABLET | Freq: Four times a day (QID) | ORAL | Status: DC | PRN

## 2013-04-01 MED ORDER — DRONEDARONE HCL 400 MG PO TABS
400.0000 mg | ORAL_TABLET | Freq: Two times a day (BID) | ORAL | Status: DC
  Administered 2013-04-01 – 2013-04-04 (×7): 400 mg via ORAL
  Filled 2013-04-01 (×8): qty 1

## 2013-04-01 MED ORDER — ASPIRIN EC 81 MG PO TBEC
81.0000 mg | DELAYED_RELEASE_TABLET | Freq: Every day | ORAL | Status: DC
  Administered 2013-04-02 – 2013-04-04 (×2): 81 mg via ORAL
  Filled 2013-04-01 (×4): qty 1

## 2013-04-01 MED ORDER — CO-ENZYME Q-10 50 MG PO CAPS: 50.0000 mg | ORAL_CAPSULE | Freq: Every day | ORAL | Status: DC

## 2013-04-01 MED ORDER — APIXABAN 5 MG PO TABS
5.0000 mg | ORAL_TABLET | Freq: Two times a day (BID) | ORAL | Status: DC
  Administered 2013-04-01 – 2013-04-04 (×6): 5 mg via ORAL
  Filled 2013-04-01 (×7): qty 1

## 2013-04-01 MED ORDER — TAMSULOSIN HCL 0.4 MG PO CAPS
0.4000 mg | ORAL_CAPSULE | Freq: Every day | ORAL | Status: DC
  Administered 2013-04-02 – 2013-04-04 (×3): 0.4 mg via ORAL
  Filled 2013-04-01 (×3): qty 1

## 2013-04-01 MED ORDER — DILTIAZEM HCL 100 MG IV SOLR: 10.0000 mg/h | INTRAVENOUS | Status: DC

## 2013-04-01 MED ORDER — ALUM & MAG HYDROXIDE-SIMETH 200-200-20 MG/5ML PO SUSP
30.0000 mL | Freq: Four times a day (QID) | ORAL | Status: DC | PRN
Start: 2013-04-01 — End: 2013-04-04

## 2013-04-01 MED ORDER — ZOLPIDEM TARTRATE 5 MG PO TABS: 5.0000 mg | ORAL_TABLET | Freq: Every evening | ORAL | Status: DC | PRN

## 2013-04-01 MED ORDER — SODIUM CHLORIDE 0.9 % IJ SOLN: 3.0000 mL | INTRAMUSCULAR | Status: DC | PRN

## 2013-04-01 MED ORDER — ACETAMINOPHEN 650 MG RE SUPP: 650.0000 mg | Freq: Four times a day (QID) | RECTAL | Status: DC | PRN

## 2013-04-01 MED ORDER — ONDANSETRON HCL 4 MG PO TABS: 4.0000 mg | ORAL_TABLET | Freq: Four times a day (QID) | ORAL | Status: DC | PRN

## 2013-04-01 MED ORDER — PANTOPRAZOLE SODIUM 40 MG PO TBEC
40.0000 mg | DELAYED_RELEASE_TABLET | Freq: Two times a day (BID) | ORAL | Status: DC
  Administered 2013-04-02 – 2013-04-04 (×5): 40 mg via ORAL
  Filled 2013-04-01 (×5): qty 1

## 2013-04-01 MED ORDER — VITAMIN D3 25 MCG (1000 UNIT) PO TABS
1000.0000 [IU] | ORAL_TABLET | Freq: Every day | ORAL | Status: DC
  Administered 2013-04-01 – 2013-04-04 (×4): 1000 [IU] via ORAL
  Filled 2013-04-01 (×4): qty 1

## 2013-04-01 MED ORDER — SODIUM CHLORIDE 0.9 % IJ SOLN
3.0000 mL | Freq: Two times a day (BID) | INTRAMUSCULAR | Status: DC
  Administered 2013-04-02 – 2013-04-03 (×3): 3 mL via INTRAVENOUS

## 2013-04-01 MED ORDER — DOCUSATE SODIUM 100 MG PO CAPS
100.0000 mg | ORAL_CAPSULE | Freq: Two times a day (BID) | ORAL | Status: DC
  Administered 2013-04-01 – 2013-04-04 (×6): 100 mg via ORAL
  Filled 2013-04-01 (×8): qty 1

## 2013-04-01 MED ORDER — SODIUM CHLORIDE 0.9 % IV SOLN: 250.0000 mL | INTRAVENOUS | Status: DC | PRN

## 2013-04-01 MED ORDER — ONDANSETRON HCL 4 MG/2ML IJ SOLN: 4.0000 mg | Freq: Four times a day (QID) | INTRAMUSCULAR | Status: DC | PRN

## 2013-04-01 NOTE — H&P (Signed)
Dustin Berry is an 67 y.o. male.    Primary Cardiologist:Dr. Tresa Endo PCP: Oliver Barre, MD  Chief Complaint: weak, tired, increased SOB with exertion and now chest pressure/burning with exertion  HPI: 40 YOWMM with HX of PAF and has been on flecainide and Rythmol   in the past was seen on the 24th in our office with recurrent PAF with rate in the 120-130s.  He was given 1.5 liter of NS due to BP in the 90s and IV metoprolol 2.5 mg x 3, Eliquis 5 mg and then 25 mg of Lopressor po given with slowing of his HR to 114 and BP 110/70. He also took extra BB at home that night.  (Pt had self discontinued  his BB in last few months).    Pt called into today feeling poorly.  He came to the office, He has been SOB at rest at night and with exertion SOB and chest pressure/burning.  He did state he has had this burning with freq. PACs in the past.  No nausea or diaphoresis.  In the office EKG with A fib with rate 144 after resting on table for 10-15 min.  Discussed with Dr. Allyson Sabal and it was felt to admit to tele for further management.    He also complains of possible hematuria and abd. discomfort.       Last Nuc in 2008, without ischemia.    Past Medical History  Diagnosis Date  . Neuromuscular disorder     peripheral neuropathy  . Atrial fibrillation   . ALLERGIC RHINITIS 05/12/2008  . ESOPHAGEAL STRICTURE 03/24/2009  . HYPERLIPIDEMIA 08/18/2007  . INSOMNIA-SLEEP DISORDER-UNSPEC 05/14/2009  . PERIPHERAL NEUROPATHY 05/12/2008  . Personal history of colonic polyps 05/12/2008  . Preventative health care 06/24/2011  . Boil of buttock 06/24/11  . OSA (obstructive sleep apnea) 07/04/2011  . Mitral valve prolapse 07/04/2011  . BPH (benign prostatic hypertrophy) 09/05/2012  . Atrial fibrillation with RVR 04/01/2013  . Chest pain with exertion presumed to be tachycardia related along with SOB 04/01/2013    Past Surgical History  Procedure Laterality Date  . Hand surgery      Thumb joint repair  . Tonsillectomy  and adenoidectomy    . Upper gastrointestinal endoscopy      dilation  . Colonoscopy    . Polypectomy      Family History  Problem Relation Age of Onset  . Heart disease Father 43    died with MI  . Cancer Sister     Melanoma  . Stroke Maternal Grandfather   . Dementia Mother    Social History:  reports that he has never smoked. He has never used smokeless tobacco. He reports that he drinks about 0.6 ounces of alcohol per week. His drug history is not on file. no drug use.  Married  Allergies: No Known Allergies  Medications Prior to Admission  Medication Sig Dispense Refill  . apixaban (ELIQUIS) 5 MG TABS tablet Take 5 mg by mouth 2 (two) times daily.      . cholecalciferol (VITAMIN D) 1000 UNITS tablet Take 1,000 Units by mouth daily.        Marland Kitchen co-enzyme Q-10 50 MG capsule Take 50 mg by mouth daily.       . CYANOCOBALAMIN PO Take 1 tablet by mouth at bedtime.      . Ginkgo Biloba (GINKOBA PO) Take 1 capsule by mouth at bedtime.      Marland Kitchen ibuprofen (ADVIL,MOTRIN) 200 MG tablet Take 400-600  mg by mouth every 6 (six) hours as needed for headache.      . metoprolol succinate (TOPROL-XL) 50 MG 24 hr tablet Take 50 mg by mouth every evening. Take with or immediately following a meal.      . Omega-3 Fatty Acids (FISH OIL PO) Take 1 capsule by mouth at bedtime.      . Probiotic Product (PROBIOTIC DAILY) CAPS Take 1 capsule by mouth at bedtime.      . Red Yeast Rice Extract (RED YEAST RICE PO) Take 2 capsules by mouth at bedtime.      . tamsulosin (FLOMAX) 0.4 MG CAPS Take 1 capsule (0.4 mg total) by mouth daily.  90 capsule  3    Results for orders placed during the hospital encounter of 04/01/13 (from the past 48 hour(s))  CBC WITH DIFFERENTIAL     Status: None   Collection Time    04/01/13  4:07 PM      Result Value Range   WBC 5.3  4.0 - 10.5 K/uL   RBC 4.70  4.22 - 5.81 MIL/uL   Hemoglobin 13.7  13.0 - 17.0 g/dL   HCT 74.2  59.5 - 63.8 %   MCV 84.0  78.0 - 100.0 fL   MCH 29.1   26.0 - 34.0 pg   MCHC 34.7  30.0 - 36.0 g/dL   RDW 75.6  43.3 - 29.5 %   Platelets 206  150 - 400 K/uL   Neutrophils Relative 44  43 - 77 %   Neutro Abs 2.3  1.7 - 7.7 K/uL   Lymphocytes Relative 41  12 - 46 %   Lymphs Abs 2.1  0.7 - 4.0 K/uL   Monocytes Relative 12  3 - 12 %   Monocytes Absolute 0.7  0.1 - 1.0 K/uL   Eosinophils Relative 2  0 - 5 %   Eosinophils Absolute 0.1  0.0 - 0.7 K/uL   Basophils Relative 0  0 - 1 %   Basophils Absolute 0.0  0.0 - 0.1 K/uL  APTT     Status: Abnormal   Collection Time    04/01/13  4:07 PM      Result Value Range   aPTT 41 (*) 24 - 37 seconds   Comment:            IF BASELINE aPTT IS ELEVATED,     SUGGEST PATIENT RISK ASSESSMENT     BE USED TO DETERMINE APPROPRIATE     ANTICOAGULANT THERAPY.  PROTIME-INR     Status: None   Collection Time    04/01/13  4:07 PM      Result Value Range   Prothrombin Time 14.9  11.6 - 15.2 seconds   INR 1.19  0.00 - 1.49   Portable Chest 1 View  04/01/2013  *RADIOLOGY REPORT*  Clinical Data: Chest pain.  PORTABLE CHEST - 1 VIEW  Comparison: None.  Findings: Heart is borderline in size.  Lungs are clear.  No effusions.  No acute bony abnormality.  IMPRESSION: No active cardiopulmonary disease.   Original Report Authenticated By: Charlett Nose, M.D.     ROS: General:no colds or fevers, no weight changes, just feeling bad Skin:no rashes or ulcers HEENT:no blurred vision, no congestion CV:see HPI PUL:see HPI GI:no diarrhea constipation or melena, no indigestion, Abd discomfort GU:? hematuria, no dysuria MS:no joint pain, no claudication Neuro:no syncope, no lightheadedness Endo:no diabetes, no thyroid disease   Blood pressure 113/83, pulse 171, temperature 97.7 F (36.5 C),  temperature source Oral, resp. rate 16, height 6\' 6"  (1.981 m), weight 244 lb 0.8 oz (110.7 kg), SpO2 96.00%. PE: General:alert and oriented, pleasant affect, no acute distress Skin:warm and dry, brisk capillary  refill HEENT:normocephlaic sclera clear, mucus membranes moist Neck:supple, no JVD, no bruits Heart:irreg irreg, no obvious murmurs Lungs:clear without rales, rhonchi or wheezes ZOX:WRUE diffuse tenderness, + BS, soft Ext:no edema, 2+ radial and pedal pulses bil. Neuro:alert and oriented, MAE, follows commands   EKG A Fib with RVR rate 144, lat ST depression though unchanged.  Assessment/Plan Principal Problem:   Atrial fibrillation with RVR Active Problems:   HYPERLIPIDEMIA   OSA (obstructive sleep apnea)   Mitral valve prolapse   Chest pain with exertion presumed to be tachycardia related along with SOB ABD Pain Hematuria?  PLAN:  Admit to tele, MD to see for further plan, possible antiarrhythmic. Continue eliquis, check urine for blood and infection.  Possible abd pain secondary to NSAIDS along with eliquis.  ASA stopped.  Check troponin to insure no MI, most likely chest pressure related to tachycardia.  Check echo, nuc study in future unless troponin positive, then cath.      [facility] R Nurse Practitioner Certified [facility] and Vascular Pager [phone] 04/01/2013, 5:04 PM   Patient seen and examined. Agree with assessment and plan. Pt is well known to me. He has a history of PAF and remotely had been on flecanide and rhythmol. His last episofe of AF was last year when he was started on eloquis and converted to sinus prior to undergoing TEE with cardizem.  Apparently, several months ago, he stopped his Topral XL and had also stopped his Eloquis. He was seen by me in the office 4 days ago and had developed recurrent AF that morning while sleeping. In office, he was started on IV fluids since BP was 92 systolic, and he received IV lopressor 2.5 mg x3 with HR slowing to ,100. He was given 25 mg metoprolol tatrate and was told to resume his Toprol XL later that day. Today he again felt an increased ventricular rate with rates up to 140's. Will give Cardizem 20 bolus, start  drip at 10 mg/hr and initiate multaq 400 mg bid. If he does not convert then consider TEE cardioversion possibly on Wednesday. (Note that he was started on eloquis last Thursday after new onset of recurrent AF of several hours duration.) Will recheck echo to re-asses  LV function and chamber dimensions.  Consider possible AF ablation.  Lennette Bihari, MD, [facility] 04/01/2013 5:29 PM

## 2013-04-01 NOTE — Progress Notes (Signed)
PHARMACIST - PHYSICIAN ORDER COMMUNICATION  CONCERNING: P&T Medication Policy on Herbal Medications  DESCRIPTION:  This patient's order for:  Co-enzyme Q10 has been noted.  This product(s) is classified as an "herbal" or natural product. Due to a lack of definitive safety studies or FDA approval, nonstandard manufacturing practices, plus the potential risk of unknown drug-drug interactions while on inpatient medications, the Pharmacy and Therapeutics Committee does not permit the use of "herbal" or natural products of this type within [facility].   ACTION TAKEN: The pharmacy department is unable to verify this order at this time and your patient has been informed of this safety policy. Please reevaluate patient's clinical condition at discharge and address if the herbal or natural product(s) should be resumed at that time.   

## 2013-04-02 LAB — CBC
Hemoglobin: 13.7 g/dL (ref 13.0–17.0)
MCH: 29.2 pg (ref 26.0–34.0)
MCV: 82.9 fL (ref 78.0–100.0)
RBC: 4.69 MIL/uL (ref 4.22–5.81)
WBC: 7.4 10*3/uL (ref 4.0–10.5)

## 2013-04-02 LAB — BASIC METABOLIC PANEL
BUN: 14 mg/dL (ref 6–23)
CO2: 25 mEq/L (ref 19–32)
CO2: 22 mEq/L (ref 19–32)
Calcium: 8.7 mg/dL (ref 8.4–10.5)
Creatinine, Ser: 1.22 mg/dL (ref 0.50–1.35)
Glucose, Bld: 101 mg/dL — ABNORMAL HIGH (ref 70–99)
Glucose, Bld: 154 mg/dL — ABNORMAL HIGH (ref 70–99)
Potassium: 3.8 mEq/L (ref 3.5–5.1)
Sodium: 139 mEq/L (ref 135–145)

## 2013-04-02 LAB — TROPONIN I: Troponin I: <0.3 ng/mL (ref <0.30)

## 2013-04-02 MED ORDER — SODIUM CHLORIDE 0.9 % IV BOLUS (SEPSIS)
250.0000 mL | Freq: Once | INTRAVENOUS | Status: AC
  Administered 2013-04-02: 250 mL via INTRAVENOUS

## 2013-04-02 MED ORDER — SODIUM CHLORIDE 0.9 % IV SOLN: INTRAVENOUS | Status: DC

## 2013-04-02 NOTE — Progress Notes (Signed)
Utilization Review Completed.Dustin Berry T4/29/2014  

## 2013-04-02 NOTE — Care Management Note (Unsigned)
    Page 1 of 1   04/02/2013     4:16:40 PM   CARE MANAGEMENT NOTE 04/02/2013  Patient:  Dustin Berry, Dustin Berry   Account Number:  [id number]  Date Initiated:  04/02/2013  Documentation initiated by:  Dardan Shelton  Subjective/Objective Assessment:   PT ADM ON 4/28 WITH AFIB WITH RVR.  PTA, PT INDEPENDENT OF ADLS.     Action/Plan:   WILL FOLLOW FOR HOME NEEDS AS PT PROGRESSES.   Anticipated DC Date:  04/03/2013   Anticipated DC Plan:  HOME/SELF CARE      DC Planning Services  CM consult      Choice offered to / List presented to:             Status of service:  In process, will continue to follow Medicare Important Message given?   (If response is "NO", the following Medicare IM given date fields will be blank) Date Medicare IM given:   Date Additional Medicare IM given:    Discharge Disposition:    Per UR Regulation:  Reviewed for med. necessity/level of care/duration of stay  If discussed at Long Length of Stay Meetings, dates discussed:    Comments:

## 2013-04-02 NOTE — Progress Notes (Signed)
Placed pt. On CPAP auto tirtate (pt. Is unaware of home settings) via nasal mask. Pt. Is tolerating CPAP well at this time. Pt. Was made aware to call RT if he needed assistance with CPAP during the night.

## 2013-04-02 NOTE — Progress Notes (Signed)
Paged Dustin Berry informed her 2003 Dustin Berry's BP was 88/56 she said " hold the toprol and give a 250 bolus

## 2013-04-02 NOTE — Progress Notes (Signed)
Subjective: No complaints, still somewhat short of breath with mildchest tightness.   Objective: Vital signs in last 24 hours: Temp:  [97.4 F (36.3 C)-97.8 F (36.6 C)] 97.8 F (36.6 C) (04/29 0447) Pulse Rate:  [50-171] 67 (04/29 0447) Resp:  [16-20] 16 (04/29 0447) BP: (93-113)/(58-84) 102/64 mmHg (04/29 0447) SpO2:  [96 %-98 %] 96 % (04/29 0447) Weight:  [244 lb 0.8 oz (110.7 kg)] 244 lb 0.8 oz (110.7 kg) (04/28 1539) Weight change:  Last BM Date: 04/01/13 Intake/Output from previous day: +140 04/28 0701 - 04/29 0700 In: 240 [P.O.:240] Out: 100 [Urine:100] Intake/Output this shift:    PE: General:alert and oriented, pleasant affect, disappointed that he is still in a. fib  Heart:irreg irreg, no obvious murmur Lungs:diminished breath sounds, no wheezes Abd:+ BS, soft, non tender Ext:no edema   Lab Results:  Recent Labs  04/01/13 1607 04/02/13 0410  WBC 5.3 7.4  HGB 13.7 13.7  HCT 39.5 38.9*  PLT 206 213   BMET  Recent Labs  04/01/13 1607 04/02/13 0410  NA 140 139  K 4.0 3.8  CL 108 107  CO2 25 25  GLUCOSE 89 101*  BUN 20 17  CREATININE 1.34 1.22  CALCIUM 8.6 8.7    Recent Labs  04/01/13 2204 04/02/13 0410  TROPONINI <0.30 <0.30    Lab Results  Component Value Date   CHOL 168 07/02/2012   HDL 35.70* 07/02/2012   LDLCALC 108* 07/02/2012   TRIG 121.0 07/02/2012   CHOLHDL 5 07/02/2012   No results found for this basename: HGBA1C     Lab Results  Component Value Date   TSH 2.605 04/01/2013    Hepatic Function Panel  Recent Labs  04/01/13 1607  PROT 6.7  ALBUMIN 3.5  AST 20  ALT 26  ALKPHOS 57  BILITOT 0.5     Studies/Results: Portable Chest 1 View  04/01/2013  *RADIOLOGY REPORT*  Clinical Data: Chest pain.  PORTABLE CHEST - 1 VIEW  Comparison: None.  Findings: Heart is borderline in size.  Lungs are clear.  No effusions.  No acute bony abnormality.  IMPRESSION: No active cardiopulmonary disease.   Original Report Authenticated By:  Charlett Nose, M.D.     Medications: I have reviewed the patient's current medications. Marland Kitchen apixaban  5 mg Oral BID  . aspirin EC  81 mg Oral Daily  . cholecalciferol  1,000 Units Oral Daily  . docusate sodium  100 mg Oral BID  . dronedarone  400 mg Oral BID WC  . metoprolol succinate  50 mg Oral Daily  . pantoprazole  40 mg Oral BID AC  . sodium chloride  3 mL Intravenous Q12H  . sodium chloride  3 mL Intravenous Q12H  . tamsulosin  0.4 mg Oral Daily  . vitamin C with rose hips  500 mg Oral Daily   Assessment/Plan: Principal Problem:   Atrial fibrillation with RVR Active Problems:   HYPERLIPIDEMIA   OSA (obstructive sleep apnea)   Mitral valve prolapse   Chest pain with exertion presumed to be tachycardia related along with SOB   Abdominal pain   Hematuria  PLAN:continues a. Fib though rate control, on IV cardizem at 10.  Negative MI, now on Multaq and eliquis continues.  Plan TEE DCCV in AM?  Neg. MI will stop ASA as pt anticoagulated on Eliquis and no known CAD.  Mod. Hematuria on u/a. Culture pending.  This began after Eliquis started- GU consult as outpatient.  Pt with occ small pause.  Continue to monitor.  LOS: 1 day   Time spent with pt. :20 minutes. [facility] R  Nurse Practitioner Certified Pager [phone] 04/02/2013, 7:45 AM   I have seen and evaluated the patient this AM along with Nada Boozer, NPA. I agree with her findings, examination as well as impression recommendations.  Remains in Afib with ~improved rate control, but borderline hypotension with BB & IV CCB & Multaq that is new from last wk. No gross hematuria on Eliquis -- plan is fof TEE Guided DCCV tomorrow.  We also discussed consideration of referral for Afib Ablation if DCCV is unsuccessful or if AF recurs.  He has previously met with Dr. Jean Rosenthal -- EP Cardiologist @ [facility] -- would likely prefer to go there if ablation is determined appropriate.  Marykay Lex, M.D., M.S. [facility] [address] [place], [place]  [address]  [phone] Pager # [phone] 04/02/2013 1:47 PM

## 2013-04-03 ENCOUNTER — Encounter (HOSPITAL_COMMUNITY): Payer: Self-pay | Admitting: *Deleted

## 2013-04-03 ENCOUNTER — Inpatient Hospital Stay (HOSPITAL_COMMUNITY): Payer: Managed Care, Other (non HMO) | Admitting: Anesthesiology

## 2013-04-03 ENCOUNTER — Encounter (HOSPITAL_COMMUNITY)
Admission: AD | Disposition: A | Discharge status: Home / Self Care | Payer: Self-pay | Source: Ambulatory Visit | Attending: Cardiovascular Disease

## 2013-04-03 ENCOUNTER — Encounter (HOSPITAL_COMMUNITY): Payer: Self-pay | Admitting: Anesthesiology

## 2013-04-03 HISTORY — PX: TEE WITHOUT CARDIOVERSION: SHX5443

## 2013-04-03 HISTORY — PX: CARDIOVERSION: SHX1299

## 2013-04-03 LAB — URINE CULTURE: Colony Count: 40000

## 2013-04-03 LAB — BASIC METABOLIC PANEL
BUN: 15 mg/dL (ref 6–23)
CO2: 22 mEq/L (ref 19–32)
Calcium: 8.6 mg/dL (ref 8.4–10.5)
Chloride: 108 mEq/L (ref 96–112)
Creatinine, Ser: 1.29 mg/dL (ref 0.50–1.35)
Glucose, Bld: 94 mg/dL (ref 70–99)

## 2013-04-03 SURGERY — ECHOCARDIOGRAM, TRANSESOPHAGEAL
Anesthesia: General

## 2013-04-03 MED ORDER — FENTANYL CITRATE 0.05 MG/ML IJ SOLN
INTRAMUSCULAR | Status: DC | PRN
  Administered 2013-04-03 (×2): 25 ug via INTRAVENOUS

## 2013-04-03 MED ORDER — LIDOCAINE VISCOUS 2 % MT SOLN
OROMUCOSAL | Status: DC | PRN
  Administered 2013-04-03: 20 mL via OROMUCOSAL

## 2013-04-03 MED ORDER — LIDOCAINE VISCOUS 2 % MT SOLN
OROMUCOSAL | Status: AC
  Filled 2013-04-03: qty 15

## 2013-04-03 MED ORDER — PROPOFOL 10 MG/ML IV BOLUS
INTRAVENOUS | Status: DC | PRN
  Administered 2013-04-03: 30 mg via INTRAVENOUS

## 2013-04-03 MED ORDER — MIDAZOLAM HCL 5 MG/ML IJ SOLN
INTRAMUSCULAR | Status: AC
  Filled 2013-04-03: qty 2

## 2013-04-03 MED ORDER — FENTANYL CITRATE 0.05 MG/ML IJ SOLN
INTRAMUSCULAR | Status: AC
  Filled 2013-04-03: qty 2

## 2013-04-03 MED ORDER — MIDAZOLAM HCL 10 MG/2ML IJ SOLN
INTRAMUSCULAR | Status: DC | PRN
  Administered 2013-04-03: 2 mg via INTRAVENOUS
  Administered 2013-04-03: 1 mg via INTRAVENOUS
  Administered 2013-04-03: 2 mg via INTRAVENOUS

## 2013-04-03 MED ORDER — BUTAMBEN-TETRACAINE-BENZOCAINE 2-2-14 % EX AERO
INHALATION_SPRAY | CUTANEOUS | Status: DC | PRN
  Administered 2013-04-03: 1 via TOPICAL

## 2013-04-03 MED ORDER — LIDOCAINE HCL (CARDIAC) 20 MG/ML IV SOLN
INTRAVENOUS | Status: DC | PRN
  Administered 2013-04-03: 40 mg via INTRAVENOUS

## 2013-04-03 NOTE — Progress Notes (Signed)
Pt. Refused cpap for tonight. 

## 2013-04-03 NOTE — Progress Notes (Signed)
Pt. Seen and examined. Agree with the NP/PA-C note as written.  Discussed risk and benefit of TEE guided cardioversion, including possible difficulty given his history of stricture at the [facility], which was dilated several years ago and has not given him problems.  He has provided informed consent for the procedure. He has been established on Multaq and Eliquis and is nearly at steady state. Will proceed today.  He has expressed some interest in a-fib ablation if he should fail to cardiovert or re-occur on Multaq. He has requested Dr. Jean Rosenthal at [facility], who he has seen in the past.  Chrystie Nose, MD, [facility] Attending Cardiologist [facility]

## 2013-04-03 NOTE — Anesthesia Postprocedure Evaluation (Signed)
  Anesthesia Post-op Note  Patient: Dustin Berry  Procedure(s) Performed: Procedure(s): TRANSESOPHAGEAL ECHOCARDIOGRAM (TEE) (N/A) CARDIOVERSION (N/A)  Patient Location: Endoscopy Unit  Anesthesia Type:MAC  Level of Consciousness: awake, alert  and oriented  Airway and Oxygen Therapy: Patient Spontanous Breathing and Patient connected to nasal cannula oxygen  Post-op Pain: none  Post-op Assessment: Post-op Vital signs reviewed, Patient's Cardiovascular Status Stable, Respiratory Function Stable, Patent Airway, No signs of Nausea or vomiting and Pain level controlled  Post-op Vital Signs: Reviewed and stable  Complications: No apparent anesthesia complications

## 2013-04-03 NOTE — CV Procedure (Signed)
[facility]  TEE/CARDIOVERSION NOTE   TRANSESOPHAGEAL ECHOCARDIOGRAM (TEE):  Indictation: Atrial Fibrillation  Consent:   Informed consent was obtained prior to the procedure. The risks, benefits and alternatives for the procedure were discussed and the patient comprehended these risks.  Risks include, but are not limited to, cough, sore throat, vomiting, nausea, somnolence, esophageal and stomach trauma or perforation, bleeding, low blood pressure, aspiration, pneumonia, infection, trauma to the teeth and death.    Time Out: Verified patient identification, verified procedure, site/side was marked, verified correct patient position, special equipment/implants available, medications/allergies/relevent history reviewed, required imaging and test results available. Performed  Procedure:  After a procedural time-out, the patient was given 5 mg versed and 50 mcg fentanyl for moderate sedation.  The oropharynx was anesthetized 10 cc of topical 1% viscous lidocaine and 1 spray of cetacaine.  The transesophageal probe was inserted in the esophagus and stomach without difficulty and multiple views were obtained.  The patient was kept under observation until the patient left the procedure room.  The patient left the procedure room in stable condition.   Agitated microbubble saline contrast was administered.  Complications:    Complications: None Patient did tolerate procedure well.  Findings:  1. LEFT VENTRICLE: The left ventricular wall thickness is normal.  The left ventricular cavity is normal in size. Wall motion is globally reduced.  LVEF is 40-45%, with beat to beat variation.  2. RIGHT VENTRICLE:  The right ventricle is normal in structure and function without any thrombus or masses.    3. LEFT ATRIUM:  The left atrium is dilated in size without any thrombus or masses.  There is not spontaneous echo contrast ("smoke") in the left atrium consistent with a  low flow state.  4. LEFT ATRIAL APPENDAGE:  The left atrial appendage is free of any thrombus or masses. There is coarse trabeculation and a prominent "coumadin" ridge. The appendage has single lobes. Pulse doppler indicates moderate flow in the appendage with fibrillatory waves.  Color doppler opacifies the entire appendage.  5. ATRIAL SEPTUM:  The atrial septum appears intact and is free of thrombus and/or masses and bows from left to right.  There is no evidence for interatrial shunting by color doppler and saline microbubble.  6. RIGHT ATRIUM:  The right atrium is normal in size and function without any thrombus or masses.  The right atrial appendage is free of masses.  7. MITRAL VALVE:  The mitral valve is normal in structure and function with trace to mild regurgitation.  There were no vegetations or stenosis.  8. AORTIC VALVE:  The aortic valve is normal in structure and function with no regurgitation.  There were no vegetations or stenosis  9. TRICUSPID VALVE:  The tricuspid valve is normal in structure and function with Mild regurgitation. RVSP is 16 + RAP. There were no vegetations or stenosis.  10.  PULMONIC VALVE:  The tricuspid valve is normal in structure and function with trace to mild regurgitation.  There were no vegetations or stenosis.   11. AORTIC ARCH, ASCENDING AND DESCENDING AORTA:  There was no Myrtis Ser et. Al, 1992) atherosclerosis of the ascending aorta, aortic arch, or proximal descending aorta.  CARDIOVERSION:     Second Time Out: Verified patient identification, verified procedure, site/side was marked, verified correct patient position, special equipment/implants available, medications/allergies/relevent history reviewed, required imaging and test results available.  Performed  Procedure:  1. Patient placed on cardiac monitor, pulse oximetry, supplemental oxygen as necessary.  2. Sedation administered per anesthesia 3. Pacer pads placed anterior and posterior  chest. 4. Cardioverted 2 time(s).  5. Cardioverted at 150J biphasic and 200 J biphasic.  Complications:  Complications: None Patient did tolerate procedure well.  Impression:  1. No LAA thrombus. 2. Mild to moderate global hypokinesis 3. Mild TR, trace to mild MR, trace to mild PR, no AI/AS. 4. Successful cardioversion after 2 shocks to sinus rhythm.  Recommendations:  1. Continue Multaq 400 mg BID. 2. Continue Eliquis 5 mg po BID. 3.   Will evaluate and modify HF medications accordingly. 4.   May need additional diuresis.   Time Spent Directly with the Patient:  45 minutes   Chrystie Nose, MD, [facility] Attending Cardiologist [facility]  04/03/2013, 2:40 PM

## 2013-04-03 NOTE — Anesthesia Preprocedure Evaluation (Signed)
Anesthesia Evaluation  Patient identified by MRN, date of birth, ID band Patient awake    Reviewed: Allergy & Precautions, H&P , NPO status , Patient's Chart, lab work & pertinent test results  Airway Mallampati: I TM Distance: >3 FB Neck ROM: full    Dental   Pulmonary sleep apnea ,          Cardiovascular + dysrhythmias Atrial Fibrillation Rhythm:irregular Rate:Normal     Neuro/Psych  Neuromuscular disease    GI/Hepatic GERD-  ,  Endo/Other    Renal/GU      Musculoskeletal   Abdominal   Peds  Hematology   Anesthesia Other Findings   Reproductive/Obstetrics                           Anesthesia Physical Anesthesia Plan  ASA: III  Anesthesia Plan: General   Post-op Pain Management:    Induction: Intravenous  Airway Management Planned: Mask  Additional Equipment:   Intra-op Plan:   Post-operative Plan:   Informed Consent:   Plan Discussed with: CRNA, Anesthesiologist and Surgeon  Anesthesia Plan Comments:         Anesthesia Quick Evaluation

## 2013-04-03 NOTE — Progress Notes (Signed)
  Echocardiogram Echocardiogram Transesophageal has been performed.  Dustin Berry 04/03/2013, 3:00 PM

## 2013-04-03 NOTE — Progress Notes (Signed)
[facility]  Subjective: No dizziness or SOB.  Objective: Vital signs in last 24 hours: Temp:  [97.8 F (36.6 C)-98.6 F (37 C)] 97.8 F (36.6 C) (04/30 0457) Pulse Rate:  [89-94] 89 (04/30 0457) Resp:  [16-20] 20 (04/30 0457) BP: (98-105)/(69-73) 105/72 mmHg (04/30 0457) SpO2:  [97 %-98 %] 97 % (04/30 0457) Weight:  [242 lb 12.8 oz (110.133 kg)] 242 lb 12.8 oz (110.133 kg) (04/30 0457) Last BM Date: 04/02/13  Intake/Output from previous day: 04/29 0701 - 04/30 0700 In: 720 [P.O.:720] Out: 1200 [Urine:1200] Intake/Output this shift:    Medications Current Facility-Administered Medications  Medication Dose Route Frequency Provider Last Rate Last Dose  . 0.9 %  sodium chloride infusion  250 mL Intravenous PRN Nada Boozer, NP      . 0.9 %  sodium chloride infusion   Intravenous Continuous Nada Boozer, NP      . acetaminophen (TYLENOL) tablet 650 mg  650 mg Oral Q6H PRN Nada Boozer, NP       Or  . acetaminophen (TYLENOL) suppository 650 mg  650 mg Rectal Q6H PRN Nada Boozer, NP      . alum & mag hydroxide-simeth (MAALOX/MYLANTA) 200-200-20 MG/5ML suspension 30 mL  30 mL Oral Q6H PRN Nada Boozer, NP      . apixaban Everlene Balls) tablet 5 mg  5 mg Oral BID Nada Boozer, NP   5 mg at 04/02/13 2137  . aspirin EC tablet 81 mg  81 mg Oral Daily Nada Boozer, NP   81 mg at 04/02/13 0946  . cholecalciferol (VITAMIN D) tablet 1,000 Units  1,000 Units Oral Daily Nada Boozer, NP   1,000 Units at 04/03/13 [phone]  . diltiazem (CARDIZEM) 100 mg in dextrose 5 % 100 mL infusion  10 mg/hr Intravenous Titrated Lennette Bihari, MD 10 mL/hr at 04/03/13 0900 10 mg/hr at 04/03/13 0900  . docusate sodium (COLACE) capsule 100 mg  100 mg Oral BID Nada Boozer, NP   100 mg at 04/03/13 0935  . dronedarone (MULTAQ) tablet 400 mg  400 mg Oral BID WC Lennette Bihari, MD   400 mg at 04/03/13 9604  . metoprolol succinate (TOPROL-XL) 24 hr tablet 50 mg  50 mg Oral Daily Nada Boozer,  NP   50 mg at 04/03/13 0936  . ondansetron (ZOFRAN) tablet 4 mg  4 mg Oral Q6H PRN Nada Boozer, NP       Or  . ondansetron [facility]) injection 4 mg  4 mg Intravenous Q6H PRN Nada Boozer, NP      . pantoprazole (PROTONIX) EC tablet 40 mg  40 mg Oral BID AC Nada Boozer, NP   40 mg at 04/03/13 5409  . sodium chloride 0.9 % injection 3 mL  3 mL Intravenous Q12H Nada Boozer, NP   3 mL at 04/03/13 1000  . sodium chloride 0.9 % injection 3 mL  3 mL Intravenous Q12H Nada Boozer, NP   3 mL at 04/02/13 1000  . sodium chloride 0.9 % injection 3 mL  3 mL Intravenous PRN Nada Boozer, NP      . tamsulosin [facility]) capsule 0.4 mg  0.4 mg Oral Daily Nada Boozer, NP   0.4 mg at 04/03/13 0936  . vitamin C (ASCORBIC ACID) tablet 500 mg  500 mg Oral Daily Nada Boozer, NP   500 mg at 04/03/13 0935  . zolpidem (AMBIEN) tablet 5 mg  5 mg Oral QHS PRN Nada Boozer, NP  PE: General appearance: alert, cooperative and no distress Lungs: clear to auscultation bilaterally Heart: irregularly irregular rhythm and No MM Extremities: No LEE Pulses: Radials 2+ and symmetric, 1+ DPs Neurologic: Grossly normal  Lab Results:   Recent Labs  04/01/13 1607 04/02/13 0410  WBC 5.3 7.4  HGB 13.7 13.7  HCT 39.5 38.9*  PLT 206 213   BMET  Recent Labs  04/02/13 0410 04/02/13 1806 04/03/13 0425  NA 139 139 140  K 3.8 3.7 3.6  CL 107 108 108  CO2 25 22 22   GLUCOSE 101* 154* 94  BUN 17 14 15   CREATININE 1.22 1.22 1.29  CALCIUM 8.7 8.7 8.6   PT/INR  Recent Labs  04/01/13 1607  LABPROT 14.9  INR 1.19    Assessment/Plan   Principal Problem:   Atrial fibrillation with RVR Active Problems:   HYPERLIPIDEMIA   OSA (obstructive sleep apnea)   Mitral valve prolapse   Chest pain with exertion presumed to be tachycardia related along with SOB   Abdominal pain   Hematuria  Plan:  He remains in Afib RVR rate 120-130's.  Asymptomatic.  BP soft.  Unable to titrate cardizem further. TEE/DCCV  scheduled for 1300hrs.     LOS: 2 days    Dustin Berry 04/03/2013 10:31 AM

## 2013-04-03 NOTE — H&P (Signed)
     [facility]          INTERVAL PROCEDURE H&P   History and Physical Interval Note:  04/03/2013 1:04 PM  Dustin Berry has presented today for their planned procedure. The various methods of treatment have been discussed with the patient and family. After consideration of risks, benefits and other options for treatment, the patient has consented to the procedure.  The patients' outpatient history has been reviewed, patient examined, and no change in status from most recent office note within the past 30 days. I have reviewed the patients' chart and labs and will proceed as planned. Questions were answered to the patient's satisfaction.   Chrystie Nose, MD, [facility] Attending Cardiologist [facility]  Dustin Berry 04/03/2013, 1:04 PM

## 2013-04-03 NOTE — Preoperative (Signed)
Beta Blockers   Reason not to administer Beta Blockers:Not Applicable 

## 2013-04-03 NOTE — Progress Notes (Signed)
Placed pt. On CPAP auto titrate (Min: 8, Max: 20) via nasal mask. Pt. Is tolerating CPAP well at this time. Pt. Was made aware to call RT if he had any complications with CPAP during the night.

## 2013-04-03 NOTE — Transfer of Care (Signed)
Immediate Anesthesia Transfer of Care Note  Patient: Dustin Berry  Procedure(s) Performed: Procedure(s): TRANSESOPHAGEAL ECHOCARDIOGRAM (TEE) (N/A) CARDIOVERSION (N/A)  Patient Location: Endoscopy Unit  Anesthesia Type:MAC  Level of Consciousness: awake, alert  and oriented  Airway & Oxygen Therapy: Patient Spontanous Breathing and Patient connected to nasal cannula oxygen  Post-op Assessment: Report given to PACU RN  Post vital signs: Reviewed and stable  Complications: No apparent anesthesia complications

## 2013-04-04 ENCOUNTER — Encounter (HOSPITAL_COMMUNITY): Payer: Self-pay | Admitting: Internal Medicine

## 2013-04-04 MED ORDER — PANTOPRAZOLE SODIUM 40 MG PO TBEC: 40.0000 mg | DELAYED_RELEASE_TABLET | Freq: Two times a day (BID) | ORAL | Status: DC

## 2013-04-04 MED ORDER — ASPIRIN 81 MG PO TBEC: 81.0000 mg | DELAYED_RELEASE_TABLET | Freq: Every day | ORAL | Status: DC

## 2013-04-04 MED ORDER — DILTIAZEM HCL ER COATED BEADS 240 MG PO CP24
240.0000 mg | ORAL_CAPSULE | Freq: Every day | ORAL | Status: DC
  Administered 2013-04-04: 240 mg via ORAL
  Filled 2013-04-04: qty 1

## 2013-04-04 MED ORDER — DRONEDARONE HCL 400 MG PO TABS: 400.0000 mg | ORAL_TABLET | Freq: Two times a day (BID) | ORAL | Status: DC

## 2013-04-04 MED ORDER — DILTIAZEM HCL ER COATED BEADS 240 MG PO CP24: 240.0000 mg | ORAL_CAPSULE | Freq: Every day | ORAL | Status: DC

## 2013-04-04 NOTE — Progress Notes (Signed)
I have seen and evaluated the patient this AM along with Boyce Medici, PA. I agree with her findings, examination as well as impression recommendations.  Remaining in NSR post DCCV -- unfortunately was not converted to PO Diltiazem.  Will order for this AM @ comparable 24 hr dose.   Will need to monitor until mid-late afternoon to ensure rate controlled.  Exam is essentially normal - nor M/R/G noted, Abd: soft/nt/nd/nabs, no edema  No c/o SOB or CP once cardioverted, no active SSx of CHF.  I suspect mild drop in EF noted on TEE was related to prolonged Afib.  Would expect some "autodiuresis" now that he is back in NSR.   Plan to d/c this afternoon.  Marykay Lex, M.D., M.S. [facility] [address] [place], [place]  [address]  [phone] Pager # [phone] 04/04/2013 9:49 AM

## 2013-04-04 NOTE — Progress Notes (Signed)
Pt discharge instructions and patient education completed. IV site d/c. Site WNL. No further questions. D/C home. Dion Saucier

## 2013-04-04 NOTE — Discharge Summary (Signed)
Physician Discharge Summary  Patient ID: Dustin Berry MRN: [id number] DOB/AGE: [birth year] 67 y.o.  Admit date: 04/01/2013 Discharge date: 04/04/2013  Admission Diagnoses: Atrial Fibrillation with RVR.   Discharge Diagnoses:  Principal Problem:   Atrial fibrillation with RVR Active Problems:   HYPERLIPIDEMIA   OSA (obstructive sleep apnea)   Mitral valve prolapse   Chest pain with exertion presumed to be tachycardia related along with SOB   Abdominal pain   Hematuria   Discharged Condition: stable  Hospital Course: The patient is a 67 y/o male, followed at [facility] by Dr. Tresa Endo, with known PAF. On 04/01/13, the patient called [facility] with complaints of generalized malaise and increased shortness of breath. He was evaluated the same day in the office. An EKG demonstrated atrial fibrillation with a rate of 144 bpm, after resting on the table for 10-15 minutes. The patient was seen and examined in the office by Dr. Allyson Sabal, who discussed the situation with Dr. Tresa Endo. It was decided to admit the patient to [facility] for treatment of atrial fibrillation with RVR. Once admitted, he was place on IV Diltiazem and placed on PO Multaq. He was continued on Eliquis for anticoagulation. Rate control was achieved, however he continued in atrial fibrillation. It was decided to have the patient undergo a TEE DCCV. The procedure was performed by Dr. Rennis Golden. The TEE revealed no evidence of a left atrial thrombus. Dr. Rennis Golden proceeded with the cardioversion. After 2 shocks, the patient was successfully converted back into normal sinus rhythm. The patient left the endoscopy lab in stable condition. There were no postprocedural complications. The patient was transitioned from IV Diltiazem back to PO, at 240 mg daily. He remained in normal sinus rhythm for the remainder of his hospitalization. He denied any further symptoms. He was last seen and examined by Dr. Herbie Baltimore, who determined that he was stable for discharge home. He was  prescribed 240 mg PO Diltiazem daily, as well as 400 mg of Multaq BID. He was ordered to continue Eliquis, 5 mg BID, for anticoagulation. He is scheduled to follow-up at [facility] with Nada Boozer, NP on 04/17/13.  Consults: None  Significant Diagnostic Studies:   TEE/DCCV Procedure:  1. Patient placed on cardiac monitor, pulse oximetry, supplemental oxygen as necessary.  2. Sedation administered per anesthesia 3. Pacer pads placed anterior and posterior chest. 4. Cardioverted 2 time(s).  5. Cardioverted at 150J biphasic and 200 J biphasic. Complications:  Complications: None  Patient did tolerate procedure well.  Impression:  1. No LAA thrombus. 2. Mild to moderate global hypokinesis 3. Mild TR, trace to mild MR, trace to mild PR, no AI/AS. 4. Successful cardioversion after 2 shocks to sinus rhythm.   Treatments: See Hospital Course  Discharge Exam: Blood pressure 115/78, pulse 64, temperature 97.9 F (36.6 C), temperature source Oral, resp. rate 18, height 6\' 6"  (1.981 m), weight 242 lb 12.8 oz (110.133 kg), SpO2 98.00%.   Disposition: 01-Home or Self Care      Discharge Orders   Future Appointments Provider Department Dept Phone   04/17/2013 11:20 AM Nada Boozer, NP [facility] [place] [phone]   Future Orders Complete By Expires     Diet - low sodium heart healthy  As directed     Increase activity slowly  As directed         Medication List    TAKE these medications       aspirin 81 MG EC tablet  Take 1 tablet (81 mg total)  by mouth daily.     cholecalciferol 1000 UNITS tablet  Commonly known as:  VITAMIN D  Take 1,000 Units by mouth daily.     co-enzyme Q-10 50 MG capsule  Take 50 mg by mouth daily.     CYANOCOBALAMIN PO  Take 1 tablet by mouth at bedtime.     diltiazem 240 MG 24 hr capsule  Commonly known as:  CARDIZEM CD  Take 1 capsule (240 mg total) by mouth daily.     dronedarone 400 MG tablet  Commonly known as:   MULTAQ  Take 1 tablet (400 mg total) by mouth 2 (two) times daily with a meal.     ELIQUIS 5 MG Tabs tablet  Generic drug:  apixaban  Take 5 mg by mouth 2 (two) times daily.     FISH OIL PO  Take 1 capsule by mouth at bedtime.     GINKOBA PO  Take 1 capsule by mouth at bedtime.     ibuprofen 200 MG tablet  Commonly known as:  ADVIL,MOTRIN  Take 400-600 mg by mouth every 6 (six) hours as needed for headache.     metoprolol succinate 50 MG 24 hr tablet  Commonly known as:  TOPROL-XL  Take 50 mg by mouth every evening. Take with or immediately following a meal.     pantoprazole 40 MG tablet  Commonly known as:  PROTONIX  Take 1 tablet (40 mg total) by mouth 2 (two) times daily before a meal.     PROBIOTIC DAILY Caps  Take 1 capsule by mouth at bedtime.     RED YEAST RICE PO  Take 2 capsules by mouth at bedtime.     tamsulosin 0.4 MG Caps  Commonly known as:  FLOMAX  Take 1 capsule (0.4 mg total) by mouth daily.       Follow-up Information   Follow up with [facility] R, NP On 04/17/2013. (11:20 am)    Contact information:   [address] [place] [address] [phone]      TIME SPENT ON DISCHARGE, INCLUDING PHYSICIAN TIME: > 30 MINUTES  Signed: Allayne Butcher, PA-C 04/04/2013, 6:46 PM

## 2013-04-04 NOTE — Progress Notes (Signed)
[facility]  Subjective: Day 1 S/P TEE/Cardioversion. No complaints. Feels well.  Objective: Vital signs in last 24 hours: Temp:  [97.8 F (36.6 C)-98.5 F (36.9 C)] 98.2 F (36.8 C) (05/01 0506) Pulse Rate:  [70-78] 74 (05/01 0506) Resp:  [16-27] 18 (05/01 0506) BP: (101-133)/(55-84) 101/67 mmHg (05/01 0506) SpO2:  [94 %-97 %] 96 % (05/01 0506) Last BM Date: 04/03/13  Intake/Output from previous day: 04/30 0701 - 05/01 0700 In: 240 [P.O.:240] Out: 400 [Urine:400] Intake/Output this shift:    Medications Current Facility-Administered Medications  Medication Dose Route Frequency Provider Last Rate Last Dose  . 0.9 %  sodium chloride infusion  250 mL Intravenous PRN Nada Boozer, NP      . 0.9 %  sodium chloride infusion   Intravenous Continuous Nada Boozer, NP      . acetaminophen (TYLENOL) tablet 650 mg  650 mg Oral Q6H PRN Nada Boozer, NP       Or  . acetaminophen (TYLENOL) suppository 650 mg  650 mg Rectal Q6H PRN Nada Boozer, NP      . alum & mag hydroxide-simeth (MAALOX/MYLANTA) 200-200-20 MG/5ML suspension 30 mL  30 mL Oral Q6H PRN Nada Boozer, NP      . apixaban Everlene Balls) tablet 5 mg  5 mg Oral BID Nada Boozer, NP   5 mg at 04/03/13 2135  . aspirin EC tablet 81 mg  81 mg Oral Daily Nada Boozer, NP   81 mg at 04/02/13 0946  . cholecalciferol (VITAMIN D) tablet 1,000 Units  1,000 Units Oral Daily Nada Boozer, NP   1,000 Units at 04/03/13 [phone]  . diltiazem (CARDIZEM) 100 mg in dextrose 5 % 100 mL infusion  10 mg/hr Intravenous Titrated Lennette Bihari, MD 10 mL/hr at 04/04/13 0601 10 mg/hr at 04/04/13 0601  . docusate sodium (COLACE) capsule 100 mg  100 mg Oral BID Nada Boozer, NP   100 mg at 04/03/13 2135  . dronedarone (MULTAQ) tablet 400 mg  400 mg Oral BID WC Lennette Bihari, MD   400 mg at 04/04/13 0818  . metoprolol succinate (TOPROL-XL) 24 hr tablet 50 mg  50 mg Oral Daily Nada Boozer, NP   50 mg at 04/03/13 0936  . ondansetron  (ZOFRAN) tablet 4 mg  4 mg Oral Q6H PRN Nada Boozer, NP       Or  . ondansetron [facility]) injection 4 mg  4 mg Intravenous Q6H PRN Nada Boozer, NP      . pantoprazole (PROTONIX) EC tablet 40 mg  40 mg Oral BID AC Nada Boozer, NP   40 mg at 04/04/13 0602  . sodium chloride 0.9 % injection 3 mL  3 mL Intravenous Q12H Nada Boozer, NP   3 mL at 04/03/13 2136  . sodium chloride 0.9 % injection 3 mL  3 mL Intravenous Q12H Nada Boozer, NP   3 mL at 04/03/13 2136  . sodium chloride 0.9 % injection 3 mL  3 mL Intravenous PRN Nada Boozer, NP      . tamsulosin [facility]) capsule 0.4 mg  0.4 mg Oral Daily Nada Boozer, NP   0.4 mg at 04/03/13 0936  . vitamin C (ASCORBIC ACID) tablet 500 mg  500 mg Oral Daily Nada Boozer, NP   500 mg at 04/03/13 0935  . zolpidem (AMBIEN) tablet 5 mg  5 mg Oral QHS PRN Nada Boozer, NP        PE: General appearance: alert, cooperative and no distress Lungs:  clear to auscultation bilaterally Heart: regular rate and rhythm Extremities: no LEE Pulses: 2+ and symmetric Skin: warm and dry Neurologic: Grossly normal  Lab Results:   Recent Labs  04/01/13 1607 04/02/13 0410  WBC 5.3 7.4  HGB 13.7 13.7  HCT 39.5 38.9*  PLT 206 213   BMET  Recent Labs  04/02/13 0410 04/02/13 1806 04/03/13 0425  NA 139 139 140  K 3.8 3.7 3.6  CL 107 108 108  CO2 25 22 22   GLUCOSE 101* 154* 94  BUN 17 14 15   CREATININE 1.22 1.22 1.29  CALCIUM 8.7 8.7 8.6   PT/INR  Recent Labs  04/01/13 1607  LABPROT 14.9  INR 1.19   Studies/Results:  TEE/Cardioversion 04/03/13 Impression:  1. No LAA thrombus. 2. Mild to moderate global hypokinesis 3. Mild TR, trace to mild MR, trace to mild PR, no AI/AS. 4. Successful cardioversion after 2 shocks to sinus rhythm.   Assessment/Plan  Principal Problem:   Atrial fibrillation with RVR Active Problems:   HYPERLIPIDEMIA   OSA (obstructive sleep apnea)   Mitral valve prolapse   Chest pain with exertion presumed to be  tachycardia related along with SOB   Abdominal pain   Hematuria  Plan: Day 1 S/P successful TEE/Cardioversion for atrial fibrillation.  Maintaining NSR. HR in the 70s. Pt voices no symptoms/complaints. BP stable. Plan for discharge home today on Multaq 400 mg BID and Eliquis 5 mg BID. Will arrange f/u with either Dr. Tresa Endo or Nada Boozer, NP.     LOS: 3 days    Roxy Horseman. Sharol Harness, PA-C 04/04/2013 9:12 AM

## 2013-04-04 NOTE — Discharge Summary (Signed)
Admitted with symptomatic Afib -- rate controlled with Diltiazem gtt & BB + Multaq.  On oral anticoagulation.  TEE DCCV yesterday.  Better today.  Ready for d/c.  Marykay Lex, M.D., M.S. [facility] [address] [place], [place]  [address]  [phone] Pager # [phone] 04/04/2013 8:54 PM

## 2013-04-15 ENCOUNTER — Encounter: Payer: Self-pay | Admitting: *Deleted

## 2013-04-16 ENCOUNTER — Encounter: Payer: Self-pay | Admitting: Pharmacist Clinician (PhC)/ Clinical Pharmacy Specialist

## 2013-04-17 ENCOUNTER — Encounter: Payer: Self-pay | Admitting: Cardiology

## 2013-04-17 ENCOUNTER — Encounter: Payer: Self-pay | Admitting: Cardiovascular Disease

## 2013-04-17 ENCOUNTER — Ambulatory Visit (INDEPENDENT_AMBULATORY_CARE_PROVIDER_SITE_OTHER): Payer: Managed Care, Other (non HMO) | Admitting: Cardiology

## 2013-04-17 VITALS — BP 100/72 | HR 46 | Ht 78.0 in | Wt 246.0 lb

## 2013-04-17 DIAGNOSIS — I498 Other specified cardiac arrhythmias: Secondary | ICD-10-CM

## 2013-04-17 DIAGNOSIS — R001 Bradycardia, unspecified: Secondary | ICD-10-CM

## 2013-04-17 DIAGNOSIS — R319 Hematuria, unspecified: Secondary | ICD-10-CM

## 2013-04-17 DIAGNOSIS — R079 Chest pain, unspecified: Secondary | ICD-10-CM

## 2013-04-17 DIAGNOSIS — I4891 Unspecified atrial fibrillation: Secondary | ICD-10-CM

## 2013-04-17 DIAGNOSIS — G4700 Insomnia, unspecified: Secondary | ICD-10-CM

## 2013-04-17 HISTORY — DX: Bradycardia, unspecified: R00.1

## 2013-04-17 MED ORDER — DILTIAZEM HCL ER COATED BEADS 120 MG PO CP24: 120.0000 mg | ORAL_CAPSULE | Freq: Every day | ORAL | Status: DC

## 2013-04-17 NOTE — Assessment & Plan Note (Signed)
Recent afib with RVR, admitted to [facility] and IV cardizem and continued anticoagulation with Eliquis, echo stable and underwent DCCV with 2 shocks to SR.  Had also been started Multaq.  Now to see Dr. Jean Rosenthal at [facility] tomorrow for EP.

## 2013-04-17 NOTE — Assessment & Plan Note (Addendum)
Sinus brady with HR in the 40's, mildly symptomatic.  Will decrease cardizem to 120 mg daily, new prescription sent.  He will hold the dose today.  If no improvement in heart rate will stop completely.  BP borderline which will improve with decrease of cardizem.

## 2013-04-17 NOTE — Progress Notes (Signed)
04/17/2013  JYN:WGNFA John, MD    Chief Complaint  Patient presents with  . POST CARDIOVERSION    patient complains of SOB when climbing stairs. Patient has appointment with Dr. Sedalia Muta tommorrow.    Primary Cardiologist: Dr. Bishop Limbo   HPI:  17 YOWMM with HX of PAF and has been on flecainide and Rythmol in the past was seen on the 24th in our office with recurrent PAF with rate in the 120-130s. He was given 1.5 liter of NS due to BP in the 90s and IV metoprolol 2.5 mg x 3, Eliquis 5 mg and then 25 mg of Lopressor po given with slowing of his HR to 114 and BP 110/70. He also took extra BB at home that night. (Pt had self discontinued his BB in last few months).  Pt was seen in the office for SOB at rest at night and with exertion SOB and chest pressure/burning. He did state he has had this burning with freq. PACs in the past. No nausea or diaphoresis. In the office EKG with A fib with rate 144 after resting on table for 10-15 min.  He also complained of possible hematuria and abd. discomfort. Last Nuc in 2008, without ischemia.  Pt was cardioverted after TEE 04/03/13.  EF 40-45%. No thrombus on TEE.  Pt has done well since that time except for some fatigue and DOE but only with walking up stairs.  Lightheaded only with sneeze today. No chest pain.  To see Dr. Jean Rosenthal at [facility].    No Known Allergies  Current Outpatient Prescriptions  Medication Sig Dispense Refill  . apixaban (ELIQUIS) 5 MG TABS tablet Take 5 mg by mouth 2 (two) times daily.      Marland Kitchen aspirin EC 81 MG EC tablet Take 1 tablet (81 mg total) by mouth daily.      Marland Kitchen diltiazem (CARDIZEM CD) 120 MG 24 hr capsule Take 1 capsule (120 mg total) by mouth daily.  30 capsule  5  . dronedarone (MULTAQ) 400 MG tablet Take 1 tablet (400 mg total) by mouth 2 (two) times daily with a meal.  60 tablet  5  . metoprolol succinate (TOPROL-XL) 50 MG 24 hr tablet Take 50 mg by mouth every evening. Take with or immediately following a meal.       . tamsulosin (FLOMAX) 0.4 MG CAPS Take 1 capsule (0.4 mg total) by mouth daily.  90 capsule  3  . cholecalciferol (VITAMIN D) 1000 UNITS tablet Take 1,000 Units by mouth daily.        Marland Kitchen co-enzyme Q-10 50 MG capsule Take 50 mg by mouth daily.       . CYANOCOBALAMIN PO Take 1 tablet by mouth at bedtime.      . Ginkgo Biloba (GINKOBA PO) Take 1 capsule by mouth at bedtime.      Marland Kitchen ibuprofen (ADVIL,MOTRIN) 200 MG tablet Take 400-600 mg by mouth every 6 (six) hours as needed for headache.      . Omega-3 Fatty Acids (FISH OIL PO) Take 1 capsule by mouth at bedtime.      . pantoprazole (PROTONIX) 40 MG tablet Take 1 tablet (40 mg total) by mouth 2 (two) times daily before a meal.  60 tablet  5  . Probiotic Product (PROBIOTIC DAILY) CAPS Take 1 capsule by mouth at bedtime.      . Red Yeast Rice Extract (RED YEAST RICE PO) Take 2 capsules by mouth at bedtime.  No current facility-administered medications for this visit.    Past Medical History  Diagnosis Date  . ALLERGIC RHINITIS 05/12/2008  . ESOPHAGEAL STRICTURE 03/24/2009  . HYPERLIPIDEMIA 08/18/2007  . INSOMNIA-SLEEP DISORDER-UNSPEC 05/14/2009  . Personal history of colonic polyps 05/12/2008  . Preventative health care 06/24/2011  . Boil of buttock 06/24/11  . Mitral valve prolapse 07/04/2011  . BPH (benign prostatic hypertrophy) 09/05/2012  . Chest pain with exertion presumed to be tachycardia related along with SOB 04/01/2013  . Abdominal pain 04/01/2013  . Hematuria, possible 04/01/2013  . OSA (obstructive sleep apnea) 07/04/2011    sleep study - average AHI 2.5  . Atrial fibrillation with RVR 10/21/2012    Echo- EF 50-55%; mild concentric LVH; flow pattern suggestive of impaired LV relaxation; mild mitral valve prolapse, trace mitral regurgitation  . Neuromuscular disorder     peripheral neuropathy  . PERIPHERAL NEUROPATHY 05/12/2008  . Palpitations 03/06/2007    R/P MV - mild perfusion defect in basal inferoseptal, basal inferior, mid  inferoseptal, and mid inferior regions, consistent w/ infarct/scar; no scintigraphic evidence of inducible myocardial ischemia; prior non transmural infarct cannot be completely excluded; EF 48%; no significant change from previous study  . Atrial fibrillation 03/15/2010    14 day event monitor - 1 episode of sinus bradycardia    Past Surgical History  Procedure Laterality Date  . Hand surgery      Thumb joint repair  . Tonsillectomy and adenoidectomy    . Upper gastrointestinal endoscopy      dilation  . Colonoscopy    . Polypectomy    . Tee without cardioversion N/A 04/03/2013    Procedure: TRANSESOPHAGEAL ECHOCARDIOGRAM (TEE);  Surgeon: Chrystie Nose, MD;  Location: [facility] ENDOSCOPY;  Service: Cardiovascular;  Laterality: N/A;  . Cardioversion N/A 04/03/2013    Procedure: CARDIOVERSION;  Surgeon: Chrystie Nose, MD;  Location: [facility] ENDOSCOPY;  Service: Cardiovascular;  Laterality: N/A;    ZOX:WRUEAVW:UJ colds or fevers, no weight changes, fatigue Skin:no rashes or ulcers HEENT:no blurred vision, no congestion CV:see HPI PUL:see HPI GI:no diarrhea constipation or melena, no indigestion GU:no hematuria, no dysuria, complains of Dry ejaculation, this is new since discharge from the hospital MS:no joint pain, no claudication Neuro:no syncope, no lightheadedness, except this AM Endo:no diabetes, no thyroid disease  PHYSICAL EXAM BP 100/72  Pulse 46  Ht 6\' 6"  (1.981 m)  Wt 246 lb (111.585 kg)  BMI 28.43 kg/m2 General:alert and oriented, pleasant affect Skin:warm and dry, brisk capillary refill HEENT:normocephalic, sclera clear Neck:supple, no JVD Heart:S1S2 RRR, no murmur gallup rub or click Lungs:clear without rales, rhonchi or wheezes Abd:+ BS, soft, non tender Ext:no edema pedal pulses 2+ Neuro:alert and oriented   WJX:BJYNW Huston Foley with rate of 46 with 1st degree AV block PR 208 ms.  No acute changes except rate, from previous SR EKGs.  ASSESSMENT AND  PLAN:  FIBRILLATION, ATRIAL Recent afib with RVR, admitted to [facility] and IV cardizem and continued anticoagulation with Eliquis, echo stable and underwent DCCV with 2 shocks to SR.  Had also been started Multaq.  Now to see Dr. Jean Rosenthal at [facility] tomorrow for EP.  INSOMNIA-SLEEP DISORDER-UNSPEC Stable   Bradycardia Sinus brady with HR in the 40's, mildly symptomatic.  Will decrease cardizem to 120 mg daily, new prescription sent.  He will hold the dose today.  If no improvement in heart rate will stop completely.  BP borderline which will improve with decrease of cardizem.   Hematuria Occurred with initiation of Eliquis, now  resolved, but now with dry orgasm.  Will evaluate his current meds. To see if this is a side effect.  If not then needs to see urology.   Chest pain with exertion presumed to be tachycardia related along with SOB Would repeat stress myoview once HR stable.

## 2013-04-17 NOTE — Assessment & Plan Note (Signed)
Stable

## 2013-04-17 NOTE — Assessment & Plan Note (Signed)
Would repeat stress myoview once HR stable.

## 2013-04-17 NOTE — Assessment & Plan Note (Signed)
Occurred with initiation of Eliquis, now resolved, but now with dry orgasm.  Will evaluate his current meds. To see if this is a side effect.  If not then needs to see urology.

## 2013-04-17 NOTE — Patient Instructions (Addendum)
Decrease your cardizem (diltiazem) to 120 mg daily but hold today's dose.  We will check with pharmacy concerning side effects of Multaq.  We will have you follow up with Dr. Tresa Endo in 3-4 weeks.

## 2013-05-06 ENCOUNTER — Ambulatory Visit (INDEPENDENT_AMBULATORY_CARE_PROVIDER_SITE_OTHER): Payer: Managed Care, Other (non HMO) | Admitting: Cardiovascular Disease

## 2013-05-06 VITALS — BP 104/94 | HR 56 | Ht 78.0 in | Wt 243.0 lb

## 2013-05-06 DIAGNOSIS — G4733 Obstructive sleep apnea (adult) (pediatric): Secondary | ICD-10-CM

## 2013-05-06 DIAGNOSIS — R5381 Other malaise: Secondary | ICD-10-CM

## 2013-05-06 DIAGNOSIS — I4891 Unspecified atrial fibrillation: Secondary | ICD-10-CM

## 2013-05-06 DIAGNOSIS — R5383 Other fatigue: Secondary | ICD-10-CM

## 2013-05-28 ENCOUNTER — Encounter: Payer: Self-pay | Admitting: Cardiovascular Disease

## 2013-05-28 NOTE — Progress Notes (Signed)
Patient ID: Dustin Berry, male   DOB: Dec 12, 1945, 67 y.o.   MRN: [id number]     HPI: Dustin Berry, is a 67 y.o. male who presents to the office today in followup of his paroxysmal atrial fibrillation.  [name] is history of paroxysmal atrial fibrillation, mitral valve prolapse, as well as obstructive sleep apnea currently on CPAP therapy. Initially, in 2000 he was evaluated at [facility] and was treated with flecainide following EP evaluation. Subsequently, he had done well with Rythmol therapy secondary to breakthrough arrhythmia. He had been doing well with beta blocker plus Rythmol in September was found to be back in recurrent atrial fibrillation at that time he did see Dr. Annie Paras and he was on L. and was started on a request for consideration for TEE guided cardioversion ultimately he converted to sinus rhythm on his own and did not undergo cardioversion or TEE he had done well until 03/28/2013 when he presented to the office and was in atrial fibrillation with rapid ventricular response. At that time, he spent several hours with him was treated with IV beta blocker therapy in addition to venous fluid or Foley his weight was controlled. He was given a course patient for reinitiation of anticoagulation. Apparently, days later he was back in the office with again rapid atrial fibrillation and was hospitalized. During that hospitalization he ultimately did undergo TEE cardioversion with restoration of sinus rhythm. Essentially, he did undergo evaluation at [facility] and is scheduled on  6/6/ 2014 to undergo an atrial fibrillation ablation by Dr. Sedalia Muta. Of note, when he was most recently hospitalized he was started on [facility] maintaining sinus rhythm the prior to his atrial fibrillation ablation he will discontinue the multiecho this week he was instructed to discontinue additional medications per Dr. Jean Rosenthal presently he feels well. He is unaware of any recurrent breakthrough atrial  fibrillation.  Past Medical History  Diagnosis Date  . ALLERGIC RHINITIS 05/12/2008  . ESOPHAGEAL STRICTURE 03/24/2009  . HYPERLIPIDEMIA 08/18/2007  . INSOMNIA-SLEEP DISORDER-UNSPEC 05/14/2009  . Personal history of colonic polyps 05/12/2008  . Preventative health care 06/24/2011  . Boil of buttock 06/24/11  . Mitral valve prolapse 07/04/2011  . BPH (benign prostatic hypertrophy) 09/05/2012  . Chest pain with exertion presumed to be tachycardia related along with SOB 04/01/2013  . Abdominal pain 04/01/2013  . Hematuria, possible 04/01/2013  . OSA (obstructive sleep apnea) 07/04/2011    sleep study - average AHI 2.5  . Atrial fibrillation with RVR 10/21/2012    Echo- EF 50-55%; mild concentric LVH; flow pattern suggestive of impaired LV relaxation; mild mitral valve prolapse, trace mitral regurgitation  . Neuromuscular disorder     peripheral neuropathy  . PERIPHERAL NEUROPATHY 05/12/2008  . Palpitations 03/06/2007    R/P MV - mild perfusion defect in basal inferoseptal, basal inferior, mid inferoseptal, and mid inferior regions, consistent w/ infarct/scar; no scintigraphic evidence of inducible myocardial ischemia; prior non transmural infarct cannot be completely excluded; EF 48%; no significant change from previous study  . Atrial fibrillation 03/15/2010    14 day event monitor - 1 episode of sinus bradycardia    Past Surgical History  Procedure Laterality Date  . Hand surgery      Thumb joint repair  . Tonsillectomy and adenoidectomy    . Upper gastrointestinal endoscopy      dilation  . Colonoscopy    . Polypectomy    . Tee without cardioversion N/A 04/03/2013    Procedure: TRANSESOPHAGEAL ECHOCARDIOGRAM (TEE);  Surgeon: Chrystie Nose, MD;  Location: [facility] ENDOSCOPY;  Service: Cardiovascular;  Laterality: N/A;  . Cardioversion N/A 04/03/2013    Procedure: CARDIOVERSION;  Surgeon: Chrystie Nose, MD;  Location: [facility] ENDOSCOPY;  Service: Cardiovascular;  Laterality: N/A;    No Known  Allergies  Current Outpatient Prescriptions  Medication Sig Dispense Refill  . apixaban (ELIQUIS) 5 MG TABS tablet Take 5 mg by mouth 2 (two) times daily.      Marland Kitchen dronedarone (MULTAQ) 400 MG tablet Take 1 tablet (400 mg total) by mouth 2 (two) times daily with a meal.  60 tablet  5  . metoprolol succinate (TOPROL-XL) 50 MG 24 hr tablet Take 50 mg by mouth every evening. Take with or immediately following a meal.      . pantoprazole (PROTONIX) 40 MG tablet Take 1 tablet (40 mg total) by mouth 2 (two) times daily before a meal.  60 tablet  5  . aspirin EC 81 MG EC tablet Take 1 tablet (81 mg total) by mouth daily.      . cholecalciferol (VITAMIN D) 1000 UNITS tablet Take 1,000 Units by mouth daily.        Marland Kitchen co-enzyme Q-10 50 MG capsule Take 50 mg by mouth daily.       . CYANOCOBALAMIN PO Take 1 tablet by mouth at bedtime.      . Ginkgo Biloba (GINKOBA PO) Take 1 capsule by mouth at bedtime.      Marland Kitchen ibuprofen (ADVIL,MOTRIN) 200 MG tablet Take 400-600 mg by mouth every 6 (six) hours as needed for headache.      . Omega-3 Fatty Acids (FISH OIL PO) Take 1 capsule by mouth at bedtime.      . Probiotic Product (PROBIOTIC DAILY) CAPS Take 1 capsule by mouth at bedtime.      . Red Yeast Rice Extract (RED YEAST RICE PO) Take 2 capsules by mouth at bedtime.       No current facility-administered medications for this visit.    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 Since his cardioversion on multiecho denies recurrent breakthrough atrial fibrillation. He denies chest pressure. He denies PND or orthopnea. He admits to 100% compliance with the CPAP therapy. He denies residual daytime sleepiness. Other system review is negative  PE BP 104/94  Pulse 56  Ht 6\' 6"  (1.981 m)  Wt 243 lb (110.224 kg)  BMI 28.09 kg/m2  General: Alert, oriented, no distress.  Skin: normal  turgor, no rashes HEENT: Normocephalic, atraumatic. Pupils round and reactive; sclera anicteric;no lid lag, Nose without nasal septal hypertrophy Mouth/Parynx benign; Mallinpatti scale** Neck: No JVD, no carotid briuts Lungs: clear to ausculatation and percussion; no wheezing or rales Heart: RRR, s1 s2 normal no S3 gallop  Abdomen: soft, nontender; no hepatosplenomehaly, BS+; abdominal aorta nontender and not dilated by palpation. Pulses 2+ Extremities: no clubbing cyanosis or edema, Homan's sign negative  Neurologic: grossly nonfocal  ECG sinus rhythm at 56 beats per minute with nonspecific ST abnormality. QTc interval 414 ms per  LABS:  BMET    Component Value Date/Time   NA 140 04/03/2013 0425   K 3.6 04/03/2013 0425   CL 108 04/03/2013 0425   CO2 22 04/03/2013 0425   GLUCOSE 94 04/03/2013 0425   BUN 15 04/03/2013 0425   CREATININE 1.29 04/03/2013 0425   CALCIUM 8.6  04/03/2013 0425   GFRNONAA 57* 04/03/2013 0425   GFRAA 66* 04/03/2013 0425     Hepatic Function Panel     Component Value Date/Time   PROT 6.7 04/01/2013 1607   ALBUMIN 3.5 04/01/2013 1607   AST 20 04/01/2013 1607   ALT 26 04/01/2013 1607   ALKPHOS 57 04/01/2013 1607   BILITOT 0.5 04/01/2013 1607   BILIDIR 0.1 07/02/2012 1027     CBC    Component Value Date/Time   WBC 7.4 04/02/2013 0410   RBC 4.69 04/02/2013 0410   HGB 13.7 04/02/2013 0410   HCT 38.9* 04/02/2013 0410   PLT 213 04/02/2013 0410   MCV 82.9 04/02/2013 0410   MCH 29.2 04/02/2013 0410   MCHC 35.2 04/02/2013 0410   RDW 14.2 04/02/2013 0410   LYMPHSABS 2.1 04/01/2013 1607   MONOABS 0.7 04/01/2013 1607   EOSABS 0.1 04/01/2013 1607   BASOSABS 0.0 04/01/2013 1607     BNP    Component Value Date/Time   PROBNP 1933.0* 04/01/2013 1608    Lipid Panel     Component Value Date/Time   CHOL 168 07/02/2012 1027   TRIG 121.0 07/02/2012 1027   HDL 35.70* 07/02/2012 1027   CHOLHDL 5 07/02/2012 1027   VLDL 24.2 07/02/2012 1027   LDLCALC 108* 07/02/2012 1027      RADIOLOGY: No results found.    ASSESSMENT AND PLAN: Mr. Ronaldo Miyamoto has a history of paroxysmal atrial fibrillation dating back to 2 without. Since his most recent cardioversion he has been maintaining sinus rhythm on multiecho. He'll be seeing Dr. Sedalia Muta on Friday, 05/10/2013 will undergo atrial fibrillation ablation procedure at [facility]. He has been instructed to continue several of his medications prior to that value aeration is only he is stable without chest pain or CHF symptoms. I'll see him back in the office in several months for followup evaluation.     Lennette Bihari, MD, [facility]  05/28/2013 10:49 PM

## 2013-09-24 ENCOUNTER — Ambulatory Visit (INDEPENDENT_AMBULATORY_CARE_PROVIDER_SITE_OTHER): Payer: Managed Care, Other (non HMO) | Admitting: Cardiovascular Disease

## 2013-09-24 VITALS — BP 130/100 | HR 62 | Ht 78.0 in | Wt 249.7 lb

## 2013-09-24 DIAGNOSIS — R319 Hematuria, unspecified: Secondary | ICD-10-CM

## 2013-09-24 DIAGNOSIS — G4733 Obstructive sleep apnea (adult) (pediatric): Secondary | ICD-10-CM

## 2013-09-24 DIAGNOSIS — I4891 Unspecified atrial fibrillation: Secondary | ICD-10-CM

## 2013-09-24 MED ORDER — LISINOPRIL 5 MG PO TABS: 5.0000 mg | ORAL_TABLET | Freq: Every day | ORAL | Status: DC

## 2013-09-24 NOTE — Patient Instructions (Signed)
Your physician has requested that you have an echocardiogram. Echocardiography is a painless test that uses sound waves to create images of your heart. It provides your doctor with information about the size and shape of your heart and how well your heart's chambers and valves are working. This procedure takes approximately one hour. There are no restrictions for this procedure. This will be done in 2-3 months.  Your physician has recommended you make the following change in your medication: start new prescription for lisinopril. This has already been sent to the pharmacy.  Your physician recommends that you schedule a follow-up appointment in: December.

## 2013-10-05 ENCOUNTER — Encounter: Payer: Self-pay | Admitting: Cardiovascular Disease

## 2013-10-05 NOTE — Progress Notes (Signed)
Patient ID: Dustin Berry, male   DOB: 1946/06/18, 67 y.o.   MRN: [id number]     HPI: Dustin Berry is a 67 y.o. male who presents for followup evaluation following his recent atrial fibrillation ablation done by Dr. Sedalia Muta at [facility] on 05/10/2013.  Dustin Berry has a long-standing history of paroxysmal atrial fibrillation. Initially in 2000 Dustin Berry was evaluated at [facility] and was treated at that time with flecainide. Dustin Berry subsequently developed breakthrough arrhythmia's and had done well with Rythmol SR and can, beta blocker. Remotely, Dustin Berry had seen Dr. Sharol Harness at [facility] as well as Dr. Jean Rosenthal at [facility] for consideration of atrial fibrillation ablation in 2011 at that time a decision not to have therapy. Dustin Berry had done well until recently but developed recurrent problems with recurrent atrial fibrillation despite medical therapy. Dustin Berry also has been diagnosed with obstructive sleep apnea and has been utilizing CPAP therapy with 100% compliance. Dustin Berry ultimately went back to [facility] and on June 6,2014 underwent pulmonary vein isolation of all pulmonary veins and radiofrequency ablation of CTI with bidirectional block noted with RA and CS pacing by Dr. Jean Rosenthal. Dustin Berry has been on eloquence 5 mg twice a day for anticoagulation. At that time Dustin Berry was taken off his multipack and metoprolol.  Recently, Dustin Berry has noticed that his blood pressure has started to increase. Dustin Berry is unaware of any recurrent atrial fibrillation. Dustin Berry has been using his CPAP therapy. Dustin Berry presents for evaluation.  Past Medical History  Diagnosis Date  . ALLERGIC RHINITIS 05/12/2008  . ESOPHAGEAL STRICTURE 03/24/2009  . HYPERLIPIDEMIA 08/18/2007  . INSOMNIA-SLEEP DISORDER-UNSPEC 05/14/2009  . Personal history of colonic polyps 05/12/2008  . Preventative health care 06/24/2011  . Boil of buttock 06/24/11  . Mitral valve prolapse 07/04/2011  . BPH (benign prostatic hypertrophy) 09/05/2012  . Chest pain with exertion presumed to be tachycardia  related along with SOB 04/01/2013  . Abdominal pain 04/01/2013  . Hematuria, possible 04/01/2013  . OSA (obstructive sleep apnea) 07/04/2011    sleep study - average AHI 2.5  . Atrial fibrillation with RVR 10/21/2012    Echo- EF 50-55%; mild concentric LVH; flow pattern suggestive of impaired LV relaxation; mild mitral valve prolapse, trace mitral regurgitation  . Neuromuscular disorder     peripheral neuropathy  . PERIPHERAL NEUROPATHY 05/12/2008  . Palpitations 03/06/2007    R/P MV - mild perfusion defect in basal inferoseptal, basal inferior, mid inferoseptal, and mid inferior regions, consistent w/ infarct/scar; no scintigraphic evidence of inducible myocardial ischemia; prior non transmural infarct cannot be completely excluded; EF 48%; no significant change from previous study  . Atrial fibrillation 03/15/2010    14 day event monitor - 1 episode of sinus bradycardia    Past Surgical History  Procedure Laterality Date  . Hand surgery      Thumb joint repair  . Tonsillectomy and adenoidectomy    . Upper gastrointestinal endoscopy      dilation  . Colonoscopy    . Polypectomy    . Tee without cardioversion N/A 04/03/2013    Procedure: TRANSESOPHAGEAL ECHOCARDIOGRAM (TEE);  Surgeon: Chrystie Nose, MD;  Location: [facility] ENDOSCOPY;  Service: Cardiovascular;  Laterality: N/A;  . Cardioversion N/A 04/03/2013    Procedure: CARDIOVERSION;  Surgeon: Chrystie Nose, MD;  Location: [facility] ENDOSCOPY;  Service: Cardiovascular;  Laterality: N/A;    No Known Allergies  Current Outpatient Prescriptions  Medication Sig Dispense Refill  . aspirin EC 81 MG EC tablet Take 1 tablet (81 mg  total) by mouth daily.      . Red Yeast Rice Extract (RED YEAST RICE PO) Take 2 capsules by mouth at bedtime.      Marland Kitchen lisinopril (PRINIVIL,ZESTRIL) 5 MG tablet Take 1 tablet (5 mg total) by mouth daily.  30 tablet  6   No current facility-administered medications for this visit.    History   Social History  . Marital  Status: Married    Spouse Name: N/A    Number of Children: N/A  . Years of Education: N/A   Occupational History  . semi-retired realestate, former self employed tech co support    Social History Main Topics  . Smoking status: Never Smoker   . Smokeless tobacco: Never Used  . Alcohol Use: 0.6 oz/week    1 Glasses of wine per week  . Drug Use: No  . Sexual Activity: Not on file   Other Topics Concern  . Not on file   Social History Narrative  . No narrative on file    Family History  Problem Relation Age of Onset  . Heart disease Father 60    died with MI  . Cancer Sister     Melanoma  . Stroke Maternal Grandfather   . Dementia Mother    Social history is notable in that Dustin Berry is married has 4 children. Dustin Berry never smoked cigarettes. Dustin Berry does try to exercise. Previously had played basketball. 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  ROS is negative for fevers, chills or night sweats. Dustin Berry denies rash. Dustin Berry denies bleeding. Dustin Berry denies visual changes. Dustin Berry denies shortness of breath. Dustin Berry denies recurrent palpitations. Dustin Berry denies chest pressure. Dustin Berry denies change in bowel or bladder habits. Dustin Berry denies GU symptoms. Dustin Berry denies blood in his stool or urine. Dustin Berry denies claudication. Dustin Berry denies myalgias. There is a remote history of hand surgery. There is no diabetes. There is no history of other endocrine problems.   Other comprehensive 12 point system review is negative.  PE BP 130/100  Pulse 62  Ht 6\' 6"  (1.981 m)  Wt 249 lb 11.2 oz (113.263 kg)  BMI 28.86 kg/m2  Repeat blood pressure by me was 130/92. General: Alert, oriented, no distress.  Skin: normal turgor, no rashes HEENT: Normocephalic, atraumatic. Pupils round and reactive; sclera anicteric;no lid lag.  Nose without nasal septal hypertrophy Mouth/Parynx benign; Mallinpatti scale 3 Neck: No JVD, no carotid briuts Lungs: clear to ausculatation and percussion; no wheezing or rales Heart: RRR, s1 s2 normal no S3 or S4 gallop.  Faint 1/6 systolic murmur. Abdomen: soft, nontender; no hepatosplenomehaly, BS+; abdominal aorta nontender and not dilated by palpation. Pulses 2+ Extremities: no clubbing cyanosis or edema, Homan's sign negative  Neurologic: grossly nonfocal Psychologic: normal affect and mood.  ECG: Normal sinus rhythm at 62 beats per minute. Nonspecific ST-T changes.  LABS:  BMET    Component Value Date/Time   NA 140 04/03/2013 0425   K 3.6 04/03/2013 0425   CL 108 04/03/2013 0425   CO2 22 04/03/2013 0425   GLUCOSE 94 04/03/2013 0425   BUN 15 04/03/2013 0425   CREATININE 1.29 04/03/2013 0425   CALCIUM 8.6 04/03/2013 0425   GFRNONAA 57* 04/03/2013 0425   GFRAA 66* 04/03/2013 0425     Hepatic Function Panel     Component Value Date/Time   PROT 6.7 04/01/2013 1607   ALBUMIN 3.5 04/01/2013 1607   AST 20 04/01/2013 1607   ALT 26 04/01/2013 1607   ALKPHOS 57 04/01/2013 1607  BILITOT 0.5 04/01/2013 1607   BILIDIR 0.1 07/02/2012 1027     CBC    Component Value Date/Time   WBC 7.4 04/02/2013 0410   RBC 4.69 04/02/2013 0410   HGB 13.7 04/02/2013 0410   HCT 38.9* 04/02/2013 0410   PLT 213 04/02/2013 0410   MCV 82.9 04/02/2013 0410   MCH 29.2 04/02/2013 0410   MCHC 35.2 04/02/2013 0410   RDW 14.2 04/02/2013 0410   LYMPHSABS 2.1 04/01/2013 1607   MONOABS 0.7 04/01/2013 1607   EOSABS 0.1 04/01/2013 1607   BASOSABS 0.0 04/01/2013 1607     BNP    Component Value Date/Time   PROBNP 1933.0* 04/01/2013 1608    Lipid Panel     Component Value Date/Time   CHOL 168 07/02/2012 1027   TRIG 121.0 07/02/2012 1027   HDL 35.70* 07/02/2012 1027   CHOLHDL 5 07/02/2012 1027   VLDL 24.2 07/02/2012 1027   LDLCALC 108* 07/02/2012 1027     RADIOLOGY: No results found.    ASSESSMENT AND PLAN: Dustin Berry underwent successful radiofrequency ablation and palma remain isolation atrial fibrillation ablation in June 2014 by Dr. Sedalia Muta at [facility]. Dustin Berry is maintaining sinus rhythm. His blood pressure today is slightly elevated  diastolically. In the past Dustin Berry had been on medication which did keep his blood pressure lower. At this time I am electing to start low-dose lisinopril at 5 mg for a sedation. We will followup a Bmet to make certain Dustin Berry is tolerating this. In November/December Dustin Berry will undergo a followup echo Doppler study and I will see him back in the office in December for followup evaluation.     Lennette Bihari, MD, [facility]  10/05/2013 1:58 PM

## 2013-11-21 ENCOUNTER — Ambulatory Visit (HOSPITAL_COMMUNITY)
Admission: RE | Admit: 2013-11-21 | Discharge: 2013-11-21 | Disposition: A | Discharge status: Home / Self Care | Payer: Managed Care, Other (non HMO) | Source: Ambulatory Visit | Attending: Cardiovascular Disease | Admitting: Cardiovascular Disease

## 2013-11-21 DIAGNOSIS — I4891 Unspecified atrial fibrillation: Secondary | ICD-10-CM

## 2013-11-21 DIAGNOSIS — G4730 Sleep apnea, unspecified: Secondary | ICD-10-CM | POA: Insufficient documentation

## 2013-11-21 NOTE — Progress Notes (Signed)
2D Echo Performed 11/21/2013    Sophiagrace Benbrook, RCS  

## 2013-12-02 ENCOUNTER — Encounter: Payer: Self-pay | Admitting: Cardiovascular Disease

## 2013-12-02 ENCOUNTER — Ambulatory Visit (INDEPENDENT_AMBULATORY_CARE_PROVIDER_SITE_OTHER): Payer: Managed Care, Other (non HMO) | Admitting: Cardiovascular Disease

## 2013-12-02 VITALS — BP 116/88 | HR 66 | Ht 78.0 in | Wt 252.6 lb

## 2013-12-02 DIAGNOSIS — R5383 Other fatigue: Secondary | ICD-10-CM

## 2013-12-02 DIAGNOSIS — R5381 Other malaise: Secondary | ICD-10-CM

## 2013-12-02 DIAGNOSIS — I4891 Unspecified atrial fibrillation: Secondary | ICD-10-CM

## 2013-12-02 DIAGNOSIS — Z79899 Other long term (current) drug therapy: Secondary | ICD-10-CM

## 2013-12-02 DIAGNOSIS — I059 Rheumatic mitral valve disease, unspecified: Secondary | ICD-10-CM

## 2013-12-02 DIAGNOSIS — E785 Hyperlipidemia, unspecified: Secondary | ICD-10-CM

## 2013-12-02 DIAGNOSIS — G4733 Obstructive sleep apnea (adult) (pediatric): Secondary | ICD-10-CM

## 2013-12-02 DIAGNOSIS — I480 Paroxysmal atrial fibrillation: Secondary | ICD-10-CM

## 2013-12-02 DIAGNOSIS — I341 Nonrheumatic mitral (valve) prolapse: Secondary | ICD-10-CM

## 2013-12-02 DIAGNOSIS — R002 Palpitations: Secondary | ICD-10-CM

## 2013-12-02 DIAGNOSIS — I10 Essential (primary) hypertension: Secondary | ICD-10-CM

## 2013-12-02 DIAGNOSIS — E782 Mixed hyperlipidemia: Secondary | ICD-10-CM

## 2013-12-02 LAB — LIPID PANEL
Cholesterol: 191 mg/dL (ref 0–200)
HDL: 41 mg/dL (ref >39)
LDL Cholesterol: 129 mg/dL — ABNORMAL HIGH (ref 0–99)
Triglycerides: 106 mg/dL (ref <150)
VLDL: 21 mg/dL (ref 0–40)

## 2013-12-02 LAB — COMPREHENSIVE METABOLIC PANEL
ALT: 25 U/L (ref 0–53)
AST: 24 U/L (ref 0–37)
BUN: 18 mg/dL (ref 6–23)
CO2: 23 mEq/L (ref 19–32)
Calcium: 9.2 mg/dL (ref 8.4–10.5)
Chloride: 108 mEq/L (ref 96–112)
Creat: 1.11 mg/dL (ref 0.50–1.35)
Total Bilirubin: 0.8 mg/dL (ref 0.3–1.2)

## 2013-12-02 LAB — CBC
HCT: 45.7 % (ref 39.0–52.0)
Hemoglobin: 15.7 g/dL (ref 13.0–17.0)
MCV: 87.9 fL (ref 78.0–100.0)
RDW: 14.6 % (ref 11.5–15.5)
WBC: 4.8 10*3/uL (ref 4.0–10.5)

## 2013-12-02 NOTE — Patient Instructions (Signed)
Your physician recommends that you return for lab work in: 2-3 weeks FASTING at [facility].  Your physician recommends that you schedule a follow-up appointment in: 6 MONTHS.

## 2013-12-03 ENCOUNTER — Encounter: Payer: Self-pay | Admitting: *Deleted

## 2013-12-04 ENCOUNTER — Encounter: Payer: Self-pay | Admitting: Cardiovascular Disease

## 2013-12-04 DIAGNOSIS — I480 Paroxysmal atrial fibrillation: Secondary | ICD-10-CM | POA: Insufficient documentation

## 2013-12-04 DIAGNOSIS — I10 Essential (primary) hypertension: Secondary | ICD-10-CM

## 2013-12-04 HISTORY — DX: Essential (primary) hypertension: I10

## 2013-12-04 NOTE — Progress Notes (Signed)
Patient ID: Dustin Berry, male   DOB: [birth year], 67 y.o.   MRN: [id number]     HPI: Dustin Berry is a 67 y.o. male who presents for followup cardiology evaluation.   Dustin Berry has a long-standing history of paroxysmal atrial fibrillation. Initially in 2000 he was evaluated at [facility] and was treated at that time with flecainide. He subsequently developed breakthrough arrhythmia's and had done well with Rythmol SR and can, beta blocker. Remotely, he had seen Dr. Sharol Harness at [facility] as well as Dr. Jean Rosenthal at [facility] for consideration of atrial fibrillation ablation in 2011 at that time a decision not to have therapy. He had done well until recently but developed recurrent problems with recurrent atrial fibrillation despite medical therapy. He also has been diagnosed with obstructive sleep apnea and has been utilizing CPAP therapy with 100% compliance. He ultimately went back to [facility] and on June 6,2014 underwent pulmonary vein isolation of all pulmonary veins and radiofrequency ablation of CTI with bidirectional block noted with RA and CS pacing by Dr. Jean Rosenthal. He has been on eliquis 5 mg twice a day for anticoagulation. At that time he was taken off his multaq and metoprolol.  Had seen him last approximately 2 months ago at which time he was started to notice his blood pressure starting to  increase. He was unaware of any recurrent atrial fibrillation. He has been using his CPAP therapy. At that time, his blood pressure was 130/100. I elected to add low-dose lisinopril at 5 mg per day socially underwent a 2-D echo Doppler study on 11/21/2013. This showed an ejection fraction in the 45-50% range with grade 1 diastolic dysfunction. No definitive wall motion abnormalities were detected although the possibility was raised concerning possible mid inferior hypocontractility. He did have systolic bowing without definitive prolapse of his mitral valve with mild MR. He presents to the office  today for evaluation. He's been unaware of breakthrough arrhythmia. He states on the lisinopril his blood pressure has improved.  Past Medical History  Diagnosis Date  . ALLERGIC RHINITIS 05/12/2008  . ESOPHAGEAL STRICTURE 03/24/2009  . HYPERLIPIDEMIA 08/18/2007  . INSOMNIA-SLEEP DISORDER-UNSPEC 05/14/2009  . Personal history of colonic polyps 05/12/2008  . Preventative health care 06/24/2011  . Boil of buttock 06/24/11  . Mitral valve prolapse 07/04/2011  . BPH (benign prostatic hypertrophy) 09/05/2012  . Chest pain with exertion presumed to be tachycardia related along with SOB 04/01/2013  . Abdominal pain 04/01/2013  . Hematuria, possible 04/01/2013  . OSA (obstructive sleep apnea) 07/04/2011    sleep study - average AHI 2.5  . Atrial fibrillation with RVR 10/21/2012    Echo- EF 50-55%; mild concentric LVH; flow pattern suggestive of impaired LV relaxation; mild mitral valve prolapse, trace mitral regurgitation  . Neuromuscular disorder     peripheral neuropathy  . PERIPHERAL NEUROPATHY 05/12/2008  . Palpitations 03/06/2007    R/P MV - mild perfusion defect in basal inferoseptal, basal inferior, mid inferoseptal, and mid inferior regions, consistent w/ infarct/scar; no scintigraphic evidence of inducible myocardial ischemia; prior non transmural infarct cannot be completely excluded; EF 48%; no significant change from previous study  . Atrial fibrillation 03/15/2010    14 day event monitor - 1 episode of sinus bradycardia    Past Surgical History  Procedure Laterality Date  . Hand surgery      Thumb joint repair  . Tonsillectomy and adenoidectomy    . Upper gastrointestinal endoscopy      dilation  .  Colonoscopy    . Polypectomy    . Tee without cardioversion N/A 04/03/2013    Procedure: TRANSESOPHAGEAL ECHOCARDIOGRAM (TEE);  Surgeon: Chrystie Nose, MD;  Location: [facility] ENDOSCOPY;  Service: Cardiovascular;  Laterality: N/A;  . Cardioversion N/A 04/03/2013    Procedure: CARDIOVERSION;  Surgeon:  Chrystie Nose, MD;  Location: [facility] ENDOSCOPY;  Service: Cardiovascular;  Laterality: N/A;    No Known Allergies  Current Outpatient Prescriptions  Medication Sig Dispense Refill  . aspirin EC 81 MG EC tablet Take 1 tablet (81 mg total) by mouth daily.      Marland Kitchen lisinopril (PRINIVIL,ZESTRIL) 5 MG tablet Take 1 tablet (5 mg total) by mouth daily.  30 tablet  6  . Red Yeast Rice Extract (RED YEAST RICE PO) Take 2 capsules by mouth at bedtime.       No current facility-administered medications for this visit.    History   Social History  . Marital Status: Married    Spouse Name: N/A    Number of Children: N/A  . Years of Education: N/A   Occupational History  . semi-retired realestate, former self employed tech co support    Social History Main Topics  . Smoking status: Never Smoker   . Smokeless tobacco: Never Used  . Alcohol Use: 0.6 oz/week    1 Glasses of wine per week  . Drug Use: No  . Sexual Activity: Not on file   Other Topics Concern  . Not on file   Social History Narrative  . No narrative on file    Family History  Problem Relation Age of Onset  . Heart disease Father 97    died with MI  . Cancer Sister     Melanoma  . Stroke Maternal Grandfather   . Dementia Mother    Social history is notable in that he is married has 4 children. He never smoked cigarettes. He does try to exercise. Previously had played basketball. 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  ROS is negative for fevers, chills or night sweats. He denies rash. He denies bleeding. He denies visual changes. There is no change in hearing. There is no adenopathy He denies shortness of breath. He denies cough, wheezing, or purulent sputum. He denies recurrent palpitations. He denies chest pressure. He denies change in bowel or bladder habits. He denies GU symptoms. He denies blood in his stool or urine. He denies claudication. He denies myalgias. There is a remote history of hand surgery.  There is no diabetes. There is no history of other endocrine problems.   He admits to 100% compliance with CPAP therapy, and is unaware of breakthrough snoring . Other comprehensive 14 point system review is negative.  PE BP 116/88  Pulse 66  Ht 6\' 6"  (1.981 m)  Wt 252 lb 9.6 oz (114.579 kg)  BMI 29.20 kg/m2  Repeat blood pressure by me was 120/82 General: Alert, oriented, no distress.  Skin: normal turgor, no rashes HEENT: Normocephalic, atraumatic. Pupils round and reactive; sclera anicteric;no lid lag.  Nose without nasal septal hypertrophy Mouth/Parynx benign; Mallinpatti scale 3 Neck: No JVD, no carotid briuts Lungs: clear to ausculatation and percussion; no wheezing or rales Heart: RRR, s1 s2 normal no S3 or S4 gallop. Faint 1/6 systolic murmur. Abdomen: soft, nontender; no hepatosplenomehaly, BS+; abdominal aorta nontender and not dilated by palpation. Pulses 2+ Extremities: no clubbing cyanosis or edema, Homan's sign negative  Neurologic: grossly nonfocal Psychologic: normal affect and mood.  ECG: Normal  sinus rhythm at 66 beats per minute. Nonspecific ST-T changes. QTc interval 429 ms. LABS:  BMET    Component Value Date/Time   NA 140 12/02/2013 0947   K 4.4 12/02/2013 0947   CL 108 12/02/2013 0947   CO2 23 12/02/2013 0947   GLUCOSE 98 12/02/2013 0947   BUN 18 12/02/2013 0947   CREATININE 1.11 12/02/2013 0947   CREATININE 1.29 04/03/2013 0425   CALCIUM 9.2 12/02/2013 0947   GFRNONAA 57* 04/03/2013 0425   GFRAA 66* 04/03/2013 0425     Hepatic Function Panel     Component Value Date/Time   PROT 7.1 12/02/2013 0947   ALBUMIN 4.3 12/02/2013 0947   AST 24 12/02/2013 0947   ALT 25 12/02/2013 0947   ALKPHOS 56 12/02/2013 0947   BILITOT 0.8 12/02/2013 0947   BILIDIR 0.1 07/02/2012 1027     CBC    Component Value Date/Time   WBC 4.8 12/02/2013 0947   RBC 5.20 12/02/2013 0947   HGB 15.7 12/02/2013 0947   HCT 45.7 12/02/2013 0947   PLT 238 12/02/2013 0947    MCV 87.9 12/02/2013 0947   MCH 30.2 12/02/2013 0947   MCHC 34.4 12/02/2013 0947   RDW 14.6 12/02/2013 0947   LYMPHSABS 2.1 04/01/2013 1607   MONOABS 0.7 04/01/2013 1607   EOSABS 0.1 04/01/2013 1607   BASOSABS 0.0 04/01/2013 1607     BNP    Component Value Date/Time   PROBNP 1933.0* 04/01/2013 1608    Lipid Panel     Component Value Date/Time   CHOL 191 12/02/2013 0947   TRIG 106 12/02/2013 0947   HDL 41 12/02/2013 0947   CHOLHDL 4.7 12/02/2013 0947   VLDL 21 12/02/2013 0947   LDLCALC 129* 12/02/2013 0947     RADIOLOGY: No results found.    ASSESSMENT AND PLAN: Mr. Stein underwent successful radiofrequency AF ablation with pulmonary vein isolation  in June 2014 by Dr. Sedalia Muta at [facility] He is maintaining sinus rhythm. He has tolerated the institution of lisinopril from his last office visit and is at a dose of just 5 mg daily with blood pressure now normal. He is using CPAP therapy with 100% compliance he denies having any chest pain. A repeat his echo Doppler study in detail with him which showed an ejection fraction of 45-50%. He is asymptomatic without chest pain. However, the echo Doppler study there was a concern raised of the possibility of midinferior hypokinesis. On his last echo study in November 2013 there was no mention of potential wall motion abnormality at that time ejection fraction was 50-55%. Presently, he will continue his current medical regimen. He tells me he'll be seeing Dr. Jean Rosenthal next month. I have recommended that he undergo a followup nuclear perfusion study since his last study was done in 2008. I will schedule this in 6 months to make certain there is no potential for ischemia and was seen in the office for followup evaluation. I will contact him regarding his laboratory results and adjustments will be made if necessary.  Time spent: 25 minutes  Dustin Bihari, MD, [facility]  12/04/2013 10:19 AM

## 2013-12-17 ENCOUNTER — Encounter: Payer: Self-pay | Admitting: Cardiovascular Disease

## 2013-12-24 ENCOUNTER — Telehealth: Payer: Self-pay | Admitting: *Deleted

## 2013-12-24 ENCOUNTER — Other Ambulatory Visit: Payer: Self-pay | Admitting: *Deleted

## 2013-12-24 DIAGNOSIS — E785 Hyperlipidemia, unspecified: Secondary | ICD-10-CM

## 2013-12-24 NOTE — Telephone Encounter (Signed)
Message copied by Lauralee Evener on Tue Dec 24, 2013 10:30 AM ------      Message from: Shelva Majestic A      Created: Sun Dec 15, 2013  9:52 PM       Labs good; [place] with NMR in 4-6 mo ------

## 2013-12-24 NOTE — Telephone Encounter (Signed)
Left message labs look good. LDL increased from last time. No changes at this time. Repeat NMR 4-6 months.

## 2014-04-29 ENCOUNTER — Encounter: Payer: Self-pay | Admitting: *Deleted

## 2014-04-29 ENCOUNTER — Other Ambulatory Visit: Payer: Self-pay | Admitting: *Deleted

## 2014-04-29 ENCOUNTER — Ambulatory Visit (INDEPENDENT_AMBULATORY_CARE_PROVIDER_SITE_OTHER): Payer: Managed Care, Other (non HMO) | Admitting: Internal Medicine

## 2014-04-29 ENCOUNTER — Encounter: Payer: Self-pay | Admitting: Internal Medicine

## 2014-04-29 VITALS — BP 108/78 | HR 69 | Temp 98.1°F | Ht 78.0 in | Wt 235.0 lb

## 2014-04-29 DIAGNOSIS — Z23 Encounter for immunization: Secondary | ICD-10-CM

## 2014-04-29 DIAGNOSIS — E785 Hyperlipidemia, unspecified: Secondary | ICD-10-CM

## 2014-04-29 DIAGNOSIS — Z0000 Encounter for general adult medical examination without abnormal findings: Secondary | ICD-10-CM

## 2014-04-29 MED ORDER — LISINOPRIL 5 MG PO TABS: 5.0000 mg | ORAL_TABLET | Freq: Every day | ORAL | Status: DC

## 2014-04-29 NOTE — Patient Instructions (Addendum)
You had the new Prevnar pneumonia shot today  Please continue all other medications as before, and refills have been done if requested - the lisinopril 5 mg per day  Please have the pharmacy call with any other refills you may need.  Please continue your efforts at being more active, low cholesterol diet, and weight control.  You are otherwise up to date with prevention measures today.  Please keep your appointments with your specialists as you have planned  Please go to the LAB in the Basement (turn left off the elevator) for the tests to be done today  You will be contacted by phone if any changes need to be made immediately.  Otherwise, you will receive a letter about your results with an explanation, but please check with MyChart first.  Please remember to sign up for MyChart if you have not done so, as this will be important to you in the future with finding out test results, communicating by private email, and scheduling acute appointments online when needed.  Please return in 1 year for your yearly visit, or sooner if needed, with Lab testing done 3-5 days before

## 2014-04-29 NOTE — Progress Notes (Signed)
Subjective:    Patient ID: Dustin Berry, male    DOB: 1946-10-14, 68 y.o.   MRN: [id number]  HPI  Here for wellness and f/u;  Overall doing ok;  Pt denies CP, worsening SOB, DOE, wheezing, orthopnea, PND, worsening LE edema, palpitations, dizziness or syncope.  Pt denies neurological change such as new headache, facial or extremity weakness.  Pt denies polydipsia, polyuria, or low sugar symptoms. Pt states overall good compliance with treatment and medications, good tolerability, and has been trying to follow lower cholesterol diet.  Pt denies worsening depressive symptoms, suicidal ideation or panic. No fever, night sweats, wt loss, loss of appetite, or other constitutional symptoms.  Pt states good ability with ADL's, has low fall risk, home safety reviewed and adequate, no other significant changes in hearing or vision, and only occasionally active with exercise.  Has lost 18 lbs with better diet.  S/p ablation at [facility] June 2014., off most meds, only on asa 81 mg qd. Not taking the lisinopril 5 lately, last EF slightly low dec 2014. Trying to work on lower chol diet Past Medical History  Diagnosis Date  . ALLERGIC RHINITIS 05/12/2008  . ESOPHAGEAL STRICTURE 03/24/2009  . HYPERLIPIDEMIA 08/18/2007  . INSOMNIA-SLEEP DISORDER-UNSPEC 05/14/2009  . Personal history of colonic polyps 05/12/2008  . Preventative health care 06/24/2011  . Boil of buttock 06/24/11  . Mitral valve prolapse 07/04/2011  . BPH (benign prostatic hypertrophy) 09/05/2012  . Chest pain with exertion presumed to be tachycardia related along with SOB 04/01/2013  . Abdominal pain 04/01/2013  . Hematuria, possible 04/01/2013  . OSA (obstructive sleep apnea) 07/04/2011    sleep study - average AHI 2.5  . Atrial fibrillation with RVR 10/21/2012    Echo- EF 50-55%; mild concentric LVH; flow pattern suggestive of impaired LV relaxation; mild mitral valve prolapse, trace mitral regurgitation  . Neuromuscular disorder     peripheral  neuropathy  . PERIPHERAL NEUROPATHY 05/12/2008  . Palpitations 03/06/2007    R/P MV - mild perfusion defect in basal inferoseptal, basal inferior, mid inferoseptal, and mid inferior regions, consistent w/ infarct/scar; no scintigraphic evidence of inducible myocardial ischemia; prior non transmural infarct cannot be completely excluded; EF 48%; no significant change from previous study  . Atrial fibrillation 03/15/2010    14 day event monitor - 1 episode of sinus bradycardia  . HTN (hypertension) 12/04/2013   Past Surgical History  Procedure Laterality Date  . Hand surgery      Thumb joint repair  . Tonsillectomy and adenoidectomy    . Upper gastrointestinal endoscopy      dilation  . Colonoscopy    . Polypectomy    . Tee without cardioversion N/A 04/03/2013    Procedure: TRANSESOPHAGEAL ECHOCARDIOGRAM (TEE);  Surgeon: Pixie Casino, MD;  Location: [facility] ENDOSCOPY;  Service: Cardiovascular;  Laterality: N/A;  . Cardioversion N/A 04/03/2013    Procedure: CARDIOVERSION;  Surgeon: Pixie Casino, MD;  Location: [facility] ENDOSCOPY;  Service: Cardiovascular;  Laterality: N/A;    reports that he has never smoked. He has never used smokeless tobacco. He reports that he drinks about .6 ounces of alcohol per week. He reports that he does not use illicit drugs. family history includes Cancer in his sister; Dementia in his mother; Heart disease (age of onset: 71) in his father; Stroke in his maternal grandfather. No Known Allergies Current Outpatient Prescriptions on File Prior to Visit  Medication Sig Dispense Refill  . aspirin EC 81 MG EC tablet Take 1  tablet (81 mg total) by mouth daily.      . Red Yeast Rice Extract (RED YEAST RICE PO) Take 2 capsules by mouth at bedtime.       No current facility-administered medications on file prior to visit.    Review of Systems Constitutional: Negative for increased diaphoresis, other activity, appetite or other siginficant weight change  HENT: Negative for  worsening hearing loss, ear pain, facial swelling, mouth sores and neck stiffness.   Eyes: Negative for other worsening pain, redness or visual disturbance.  Respiratory: Negative for shortness of breath and wheezing. Cardiovascular: Negative for chest pain and palpitations.  Gastrointestinal: Negative for diarrhea, blood in stool, abdominal distention or other pain Genitourinary: Negative for hematuria, flank pain or change in urine volume.  Musculoskeletal: Negative for myalgias or other joint complaints.  Skin: Negative for color change and wound.  Neurological: Negative for syncope and numbness. other than noted Hematological: Negative for adenopathy. or other swelling Psychiatric/Behavioral: Negative for hallucinations, self-injury, decreased concentration or other worsening agitation.     Objective:   Physical Exam BP 108/78  Pulse 69  Temp(Src) 98.1 F (36.7 C) (Oral)  Ht 6\' 6"  (1.981 m)  Wt 235 lb (106.595 kg)  BMI 27.16 kg/m2  SpO2 97% VS noted,  Constitutional: Pt is oriented to person, place, and time. Appears well-developed and well-nourished.  Head: Normocephalic and atraumatic.  Right Ear: External ear normal.  Left Ear: External ear normal.  Nose: Nose normal.  Mouth/Throat: Oropharynx is clear and moist.  Eyes: Conjunctivae and EOM are normal. Pupils are equal, round, and reactive to light.  Neck: Normal range of motion. Neck supple. No JVD present. No tracheal deviation present.  Cardiovascular: Normal rate, regular rhythm, normal heart sounds and intact distal pulses.   Pulmonary/Chest: Effort normal and breath sounds without rales or wheezing  Abdominal: Soft. Bowel sounds are normal. NT. No HSM  Musculoskeletal: Normal range of motion. Exhibits no edema.  Lymphadenopathy:  Has no cervical adenopathy.  Neurological: Pt is alert and oriented to person, place, and time. Pt has normal reflexes. No cranial nerve deficit. Motor grossly intact Skin: Skin is warm and  dry. No rash noted.  Psychiatric:  Has normal mood and affect. Behavior is normal.     Assessment & Plan:

## 2014-04-29 NOTE — Addendum Note (Signed)
Addended by: Sharon Seller B on: 04/29/2014 11:47 AM   Modules accepted: Orders

## 2014-04-30 ENCOUNTER — Other Ambulatory Visit (INDEPENDENT_AMBULATORY_CARE_PROVIDER_SITE_OTHER): Payer: Managed Care, Other (non HMO)

## 2014-04-30 DIAGNOSIS — Z0000 Encounter for general adult medical examination without abnormal findings: Secondary | ICD-10-CM

## 2014-04-30 LAB — URINALYSIS, ROUTINE W REFLEX MICROSCOPIC
Bilirubin Urine: NEGATIVE
Hgb urine dipstick: NEGATIVE
Leukocytes, UA: NEGATIVE
NITRITE: NEGATIVE
PH: 6.5 (ref 5.0–8.0)
TOTAL PROTEIN, URINE-UPE24: NEGATIVE
Urine Glucose: NEGATIVE
Urobilinogen, UA: 0.2 (ref 0.0–1.0)

## 2014-04-30 LAB — HEPATIC FUNCTION PANEL
ALBUMIN: 4 g/dL (ref 3.5–5.2)
ALK PHOS: 50 U/L (ref 39–117)
ALT: 31 U/L (ref 0–53)
AST: 30 U/L (ref 0–37)
Bilirubin, Direct: 0.1 mg/dL (ref 0.0–0.3)
TOTAL PROTEIN: 6.8 g/dL (ref 6.0–8.3)
Total Bilirubin: 1 mg/dL (ref 0.2–1.2)

## 2014-04-30 LAB — CBC WITH DIFFERENTIAL/PLATELET
BASOS PCT: 0.4 % (ref 0.0–3.0)
Basophils Absolute: 0 10*3/uL (ref 0.0–0.1)
EOS PCT: 4.1 % (ref 0.0–5.0)
Eosinophils Absolute: 0.2 10*3/uL (ref 0.0–0.7)
HCT: 46.1 % (ref 39.0–52.0)
Hemoglobin: 15.4 g/dL (ref 13.0–17.0)
Lymphocytes Relative: 36.2 % (ref 12.0–46.0)
Lymphs Abs: 1.5 10*3/uL (ref 0.7–4.0)
MCHC: 33.5 g/dL (ref 30.0–36.0)
MCV: 89.6 fl (ref 78.0–100.0)
MONO ABS: 0.5 10*3/uL (ref 0.1–1.0)
Monocytes Relative: 12.7 % — ABNORMAL HIGH (ref 3.0–12.0)
NEUTROS PCT: 46.6 % (ref 43.0–77.0)
Neutro Abs: 1.9 10*3/uL (ref 1.4–7.7)
Platelets: 239 10*3/uL (ref 150.0–400.0)
RBC: 5.15 Mil/uL (ref 4.22–5.81)
RDW: 14.6 % (ref 11.5–15.5)
WBC: 4.2 10*3/uL (ref 4.0–10.5)

## 2014-04-30 LAB — BASIC METABOLIC PANEL
BUN: 14 mg/dL (ref 6–23)
CHLORIDE: 107 meq/L (ref 96–112)
CO2: 26 meq/L (ref 19–32)
Calcium: 9.5 mg/dL (ref 8.4–10.5)
Creatinine, Ser: 1.2 mg/dL (ref 0.4–1.5)
GFR: 64.19 mL/min (ref >60.00)
GLUCOSE: 80 mg/dL (ref 70–99)
POTASSIUM: 4.6 meq/L (ref 3.5–5.1)
SODIUM: 138 meq/L (ref 135–145)

## 2014-04-30 LAB — PSA: PSA: 0.91 ng/mL (ref 0.10–4.00)

## 2014-04-30 LAB — LIPID PANEL
Cholesterol: 166 mg/dL (ref 0–200)
HDL: 36.8 mg/dL — AB (ref >39.00)
LDL Cholesterol: 119 mg/dL — ABNORMAL HIGH (ref 0–99)
Total CHOL/HDL Ratio: 5
Triglycerides: 53 mg/dL (ref 0.0–149.0)
VLDL: 10.6 mg/dL (ref 0.0–40.0)

## 2014-04-30 LAB — TSH: TSH: 2.19 u[IU]/mL (ref 0.35–4.50)

## 2014-05-01 ENCOUNTER — Encounter: Payer: Self-pay | Admitting: Internal Medicine

## 2014-05-01 ENCOUNTER — Other Ambulatory Visit: Payer: Self-pay | Admitting: Internal Medicine

## 2014-05-01 MED ORDER — ATORVASTATIN CALCIUM 10 MG PO TABS: 10.0000 mg | ORAL_TABLET | Freq: Every day | ORAL | Status: DC

## 2014-05-08 ENCOUNTER — Telehealth: Payer: Self-pay | Admitting: Cardiovascular Disease

## 2014-05-08 NOTE — Telephone Encounter (Signed)
Please call,concerning his lab work.Pt had some lab work at his primary doctor last week,wants to know what he can use from that lab work,

## 2014-05-08 NOTE — Telephone Encounter (Signed)
Pt. Informed that we can use the lab results

## 2015-05-03 ENCOUNTER — Other Ambulatory Visit: Payer: Self-pay | Admitting: Internal Medicine

## 2015-05-05 MED ORDER — ATORVASTATIN CALCIUM 10 MG PO TABS: ORAL_TABLET | ORAL | Status: DC

## 2015-05-05 NOTE — Addendum Note (Signed)
Addended by: Earnstine Regal on: 05/05/2015 09:27 AM   Modules accepted: Orders

## 2015-05-14 ENCOUNTER — Telehealth: Payer: Self-pay | Admitting: Internal Medicine

## 2015-05-14 NOTE — Telephone Encounter (Signed)
Patient has a physical on 7/1, however, his future lab orders expire on 6/30. Can you reorder those for him.

## 2015-05-14 NOTE — Telephone Encounter (Signed)
Patient informed. 

## 2015-05-14 NOTE — Telephone Encounter (Signed)
Labs extended to 06/12/2015

## 2015-05-29 ENCOUNTER — Telehealth: Payer: Self-pay

## 2015-05-29 ENCOUNTER — Other Ambulatory Visit (INDEPENDENT_AMBULATORY_CARE_PROVIDER_SITE_OTHER): Payer: Managed Care, Other (non HMO)

## 2015-05-29 DIAGNOSIS — Z0000 Encounter for general adult medical examination without abnormal findings: Secondary | ICD-10-CM

## 2015-05-29 LAB — LIPID PANEL
CHOL/HDL RATIO: 5
Cholesterol: 167 mg/dL (ref 0–200)
HDL: 36.8 mg/dL — AB (ref >39.00)
LDL CALC: 114 mg/dL — AB (ref 0–99)
NONHDL: 130.2
Triglycerides: 80 mg/dL (ref 0.0–149.0)
VLDL: 16 mg/dL (ref 0.0–40.0)

## 2015-05-29 LAB — HEPATIC FUNCTION PANEL
ALK PHOS: 60 U/L (ref 39–117)
ALT: 17 U/L (ref 0–53)
AST: 16 U/L (ref 0–37)
Albumin: 3.9 g/dL (ref 3.5–5.2)
BILIRUBIN DIRECT: 0.1 mg/dL (ref 0.0–0.3)
BILIRUBIN TOTAL: 0.5 mg/dL (ref 0.2–1.2)
TOTAL PROTEIN: 6.6 g/dL (ref 6.0–8.3)

## 2015-05-29 LAB — URINALYSIS, ROUTINE W REFLEX MICROSCOPIC
Bilirubin Urine: NEGATIVE
Hgb urine dipstick: NEGATIVE
Ketones, ur: NEGATIVE
LEUKOCYTES UA: NEGATIVE
NITRITE: NEGATIVE
PH: 6 (ref 5.0–8.0)
SPECIFIC GRAVITY, URINE: 1.02 (ref 1.000–1.030)
Total Protein, Urine: NEGATIVE
UROBILINOGEN UA: 0.2 (ref 0.0–1.0)
Urine Glucose: NEGATIVE

## 2015-05-29 LAB — CBC WITH DIFFERENTIAL/PLATELET
BASOS ABS: 0 10*3/uL (ref 0.0–0.1)
Basophils Relative: 0.5 % (ref 0.0–3.0)
EOS ABS: 0.2 10*3/uL (ref 0.0–0.7)
Eosinophils Relative: 4.9 % (ref 0.0–5.0)
HCT: 45.4 % (ref 39.0–52.0)
Hemoglobin: 15.4 g/dL (ref 13.0–17.0)
Lymphocytes Relative: 33.6 % (ref 12.0–46.0)
Lymphs Abs: 1.6 10*3/uL (ref 0.7–4.0)
MCHC: 33.9 g/dL (ref 30.0–36.0)
MCV: 89 fl (ref 78.0–100.0)
MONO ABS: 0.6 10*3/uL (ref 0.1–1.0)
Monocytes Relative: 13.1 % — ABNORMAL HIGH (ref 3.0–12.0)
NEUTROS PCT: 47.9 % (ref 43.0–77.0)
Neutro Abs: 2.3 10*3/uL (ref 1.4–7.7)
Platelets: 263 10*3/uL (ref 150.0–400.0)
RBC: 5.1 Mil/uL (ref 4.22–5.81)
RDW: 14.3 % (ref 11.5–15.5)
WBC: 4.7 10*3/uL (ref 4.0–10.5)

## 2015-05-29 LAB — BASIC METABOLIC PANEL
BUN: 15 mg/dL (ref 6–23)
CHLORIDE: 108 meq/L (ref 96–112)
CO2: 27 mEq/L (ref 19–32)
CREATININE: 1.03 mg/dL (ref 0.40–1.50)
Calcium: 9.2 mg/dL (ref 8.4–10.5)
GFR: 76.32 mL/min (ref >60.00)
Glucose, Bld: 97 mg/dL (ref 70–99)
Potassium: 4.4 mEq/L (ref 3.5–5.1)
Sodium: 141 mEq/L (ref 135–145)

## 2015-05-29 LAB — PSA: PSA: 0.62 ng/mL (ref 0.10–4.00)

## 2015-05-29 LAB — TSH: TSH: 2.56 u[IU]/mL (ref 0.35–4.50)

## 2015-05-29 NOTE — Telephone Encounter (Signed)
Patient called to educate on Medicare Wellness apt. LVM for the patient to call back to educate and schedule for wellness visit.   

## 2015-06-01 NOTE — Telephone Encounter (Signed)
Call to discuss AWV; stated he had job and did not know if he can get off work early. Will call back if he can schedule for apt AWV

## 2015-06-05 ENCOUNTER — Encounter: Payer: Self-pay | Admitting: Internal Medicine

## 2015-06-05 ENCOUNTER — Ambulatory Visit (INDEPENDENT_AMBULATORY_CARE_PROVIDER_SITE_OTHER): Payer: Managed Care, Other (non HMO) | Admitting: Internal Medicine

## 2015-06-05 VITALS — BP 124/90 | HR 62 | Temp 97.9°F | Ht 78.0 in | Wt 239.0 lb

## 2015-06-05 DIAGNOSIS — Z0000 Encounter for general adult medical examination without abnormal findings: Secondary | ICD-10-CM | POA: Diagnosis not present

## 2015-06-05 MED ORDER — LISINOPRIL 5 MG PO TABS: 5.0000 mg | ORAL_TABLET | Freq: Every day | ORAL | Status: DC

## 2015-06-05 MED ORDER — ATORVASTATIN CALCIUM 20 MG PO TABS: 20.0000 mg | ORAL_TABLET | Freq: Every day | ORAL | Status: DC

## 2015-06-05 NOTE — Progress Notes (Signed)
Subjective:    Patient ID: Dustin Berry, male    DOB: Jun 04, 1946, 69 y.o.   MRN: [id number]  HPI  Here for wellness and f/u;  Overall doing ok;  Pt denies Chest pain, worsening SOB, DOE, wheezing, orthopnea, PND, worsening LE edema, palpitations, dizziness or syncope.  Pt denies neurological change such as new headache, facial or extremity weakness.  Pt denies polydipsia, polyuria, or low sugar symptoms. Pt states overall good compliance with treatment and medications, good tolerability, and has been trying to follow appropriate diet.  Pt denies worsening depressive symptoms, suicidal ideation or panic. No fever, night sweats, wt loss, loss of appetite, or other constitutional symptoms.  Pt states good ability with ADL's, has low fall risk, home safety reviewed and adequate, no other significant changes in hearing or vision, and only occasionally active with exercise. Got good exam with EP cardiology, no furhter f/u needed, to f/u with Dr kelly/card ever 2 yrs.  Has cysts to renal ? Genetic per urology, for genetic testing soon.   Past Medical History  Diagnosis Date  . ALLERGIC RHINITIS 05/12/2008  . ESOPHAGEAL STRICTURE 03/24/2009  . HYPERLIPIDEMIA 08/18/2007  . INSOMNIA-SLEEP DISORDER-UNSPEC 05/14/2009  . Personal history of colonic polyps 05/12/2008  . Preventative health care 06/24/2011  . Boil of buttock 06/24/11  . Mitral valve prolapse 07/04/2011  . BPH (benign prostatic hypertrophy) 09/05/2012  . Chest pain with exertion presumed to be tachycardia related along with SOB 04/01/2013  . Abdominal pain 04/01/2013  . Hematuria, possible 04/01/2013  . OSA (obstructive sleep apnea) 07/04/2011    sleep study - average AHI 2.5  . Atrial fibrillation with RVR 10/21/2012    Echo- EF 50-55%; mild concentric LVH; flow pattern suggestive of impaired LV relaxation; mild mitral valve prolapse, trace mitral regurgitation  . Neuromuscular disorder     peripheral neuropathy  . PERIPHERAL NEUROPATHY 05/12/2008    . Palpitations 03/06/2007    R/P MV - mild perfusion defect in basal inferoseptal, basal inferior, mid inferoseptal, and mid inferior regions, consistent w/ infarct/scar; no scintigraphic evidence of inducible myocardial ischemia; prior non transmural infarct cannot be completely excluded; EF 48%; no significant change from previous study  . Atrial fibrillation 03/15/2010    14 day event monitor - 1 episode of sinus bradycardia  . HTN (hypertension) 12/04/2013   Past Surgical History  Procedure Laterality Date  . Hand surgery      Thumb joint repair  . Tonsillectomy and adenoidectomy    . Upper gastrointestinal endoscopy      dilation  . Colonoscopy    . Polypectomy    . Tee without cardioversion N/A 04/03/2013    Procedure: TRANSESOPHAGEAL ECHOCARDIOGRAM (TEE);  Surgeon: Pixie Casino, MD;  Location: [facility] ENDOSCOPY;  Service: Cardiovascular;  Laterality: N/A;  . Cardioversion N/A 04/03/2013    Procedure: CARDIOVERSION;  Surgeon: Pixie Casino, MD;  Location: [facility] ENDOSCOPY;  Service: Cardiovascular;  Laterality: N/A;    reports that he has never smoked. He has never used smokeless tobacco. He reports that he drinks about 0.6 oz of alcohol per week. He reports that he does not use illicit drugs. family history includes Cancer in his sister; Dementia in his mother; Heart disease (age of onset: 44) in his father; Stroke in his maternal grandfather. No Known Allergies Current Outpatient Prescriptions on File Prior to Visit  Medication Sig Dispense Refill  . aspirin EC 81 MG EC tablet Take 1 tablet (81 mg total) by mouth daily.    Marland Kitchen  Red Yeast Rice Extract (RED YEAST RICE PO) Take 2 capsules by mouth at bedtime.     No current facility-administered medications on file prior to visit.   Review of Systems Constitutional: Negative for increased diaphoresis, other activity, appetite or siginficant weight change other than noted HENT: Negative for worsening hearing loss, ear pain, facial  swelling, mouth sores and neck stiffness.   Eyes: Negative for other worsening pain, redness or visual disturbance.  Respiratory: Negative for shortness of breath and wheezing  Cardiovascular: Negative for chest pain and palpitations.  Gastrointestinal: Negative for diarrhea, blood in stool, abdominal distention or other pain Genitourinary: Negative for hematuria, flank pain or change in urine volume.  Musculoskeletal: Negative for myalgias or other joint complaints.  Skin: Negative for color change and wound or drainage.  Neurological: Negative for syncope and numbness. other than noted Hematological: Negative for adenopathy. or other swelling Psychiatric/Behavioral: Negative for hallucinations, SI, self-injury, decreased concentration or other worsening agitation.      Objective:   Physical Exam BP 124/90 mmHg  Pulse 62  Temp(Src) 97.9 F (36.6 C) (Oral)  Ht 6\' 6"  (1.981 m)  Wt 239 lb (108.41 kg)  BMI 27.62 kg/m2  SpO2 96% VS noted,  Constitutional: Pt is oriented to person, place, and time. Appears well-developed and well-nourished, in no significant distress Head: Normocephalic and atraumatic.  Right Ear: External ear normal.  Left Ear: External ear normal.  Nose: Nose normal.  Mouth/Throat: Oropharynx is clear and moist.  Eyes: Conjunctivae and EOM are normal. Pupils are equal, round, and reactive to light.  Neck: Normal range of motion. Neck supple. No JVD present. No tracheal deviation present or significant neck LA or mass Cardiovascular: Normal rate, regular rhythm, normal heart sounds and intact distal pulses.   Pulmonary/Chest: Effort normal and breath sounds without rales or wheezing  Abdominal: Soft. Bowel sounds are normal. NT. No HSM  Musculoskeletal: Normal range of motion. Exhibits no edema.  Lymphadenopathy:  Has no cervical adenopathy.  Neurological: Pt is alert and oriented to person, place, and time. Pt has normal reflexes. No cranial nerve deficit. Motor  grossly intact Skin: Skin is warm and dry. No rash noted.  Psychiatric:  Has normal mood and affect. Behavior is normal.     Assessment & Plan:

## 2015-06-05 NOTE — Assessment & Plan Note (Signed)

## 2015-06-05 NOTE — Patient Instructions (Addendum)
OK to increase the lipitor to 20 mg per day  Please continue all other medications as before, and refills have been done if requested.  Please have the pharmacy call with any other refills you may need.  Please continue your efforts at being more active, low cholesterol diet, and weight control.  You are otherwise up to date with prevention measures today.  Please keep your appointments with your specialists as you may have planned  Please return in 1 year for your yearly visit, or sooner if needed, with Lab testing done 3-5 days before  [name] , Thank you for taking time to come for your Medicare Wellness Visit. I appreciate your ongoing commitment to your health goals. Please review the following plan we discussed and let me know if I can assist you in the future.   These are the goals we discussed: Goals    . will exercise;      Refer cardiology; On his feet during the day; Discussed core exercises;        This is a list of the screening recommended for you and due dates:  Health Maintenance  Topic Date Due  . Flu Shot  07/06/2015  . Colon Cancer Screening  06/29/2016  . Tetanus Vaccine  05/15/2019  . Shingles Vaccine  Completed  . Pneumonia vaccines  Completed   Up to date on screens;

## 2015-06-05 NOTE — Progress Notes (Signed)
Subjective:   Dustin Berry is a 69 y.o. male who presents for Medicare Annual/Subsequent preventive examination.  Review of Systems:  HRA assessment completed during visit;  Problem list review for risk for stroke; atrial fib; MVP; HTN; hyperlipidemia all under medical treatment;   Lipids Chol 167; HDL 36; LDL 114; Trig 80   Diet; eats 3 times a day; breakfast; eggs; breakfast Runs spice store and likes  Exercise; discussed back exercises; strengthening needed; will try to start exercising again   Discussed Goal to improve health based on risk  Screenings overdue Ophthalmology exam; had this year Immunizations; completed Shingles: completed Colonoscopy; 02/22/2006 EKG 12/02/13  Hearing: had hearing aids but does not wear them  Gave information on safety to take home;   Current Care Team reviewed and updated        Objective:    Vitals: BP 124/90 mmHg  Pulse 62  Temp(Src) 97.9 F (36.6 C) (Oral)  Ht 6\' 6"  (1.981 m)  Wt 239 lb (108.41 kg)  BMI 27.62 kg/m2  SpO2 96%  Tobacco History  Smoking status  . Never Smoker   Smokeless tobacco  . Never Used     Counseling given: Yes   Past Medical History  Diagnosis Date  . ALLERGIC RHINITIS 05/12/2008  . ESOPHAGEAL STRICTURE 03/24/2009  . HYPERLIPIDEMIA 08/18/2007  . INSOMNIA-SLEEP DISORDER-UNSPEC 05/14/2009  . Personal history of colonic polyps 05/12/2008  . Preventative health care 06/24/2011  . Boil of buttock 06/24/11  . Mitral valve prolapse 07/04/2011  . BPH (benign prostatic hypertrophy) 09/05/2012  . Chest pain with exertion presumed to be tachycardia related along with SOB 04/01/2013  . Abdominal pain 04/01/2013  . Hematuria, possible 04/01/2013  . OSA (obstructive sleep apnea) 07/04/2011    sleep study - average AHI 2.5  . Atrial fibrillation with RVR 10/21/2012    Echo- EF 50-55%; mild concentric LVH; flow pattern suggestive of impaired LV relaxation; mild mitral valve prolapse, trace mitral  regurgitation  . Neuromuscular disorder     peripheral neuropathy  . PERIPHERAL NEUROPATHY 05/12/2008  . Palpitations 03/06/2007    R/P MV - mild perfusion defect in basal inferoseptal, basal inferior, mid inferoseptal, and mid inferior regions, consistent w/ infarct/scar; no scintigraphic evidence of inducible myocardial ischemia; prior non transmural infarct cannot be completely excluded; EF 48%; no significant change from previous study  . Atrial fibrillation 03/15/2010    14 day event monitor - 1 episode of sinus bradycardia  . HTN (hypertension) 12/04/2013   Past Surgical History  Procedure Laterality Date  . Hand surgery      Thumb joint repair  . Tonsillectomy and adenoidectomy    . Upper gastrointestinal endoscopy      dilation  . Colonoscopy    . Polypectomy    . Tee without cardioversion N/A 04/03/2013    Procedure: TRANSESOPHAGEAL ECHOCARDIOGRAM (TEE);  Surgeon: Pixie Casino, MD;  Location: [facility] ENDOSCOPY;  Service: Cardiovascular;  Laterality: N/A;  . Cardioversion N/A 04/03/2013    Procedure: CARDIOVERSION;  Surgeon: Pixie Casino, MD;  Location: [facility] ENDOSCOPY;  Service: Cardiovascular;  Laterality: N/A;   Family History  Problem Relation Age of Onset  . Heart disease Father 20    died with MI  . Cancer Sister     Melanoma  . Stroke Maternal Grandfather   . Dementia Mother    History  Sexual Activity  . Sexual Activity: Not on file    Outpatient Encounter Prescriptions as of 06/05/2015  Medication Sig  .  aspirin EC 81 MG EC tablet Take 1 tablet (81 mg total) by mouth daily.  Marland Kitchen atorvastatin (LIPITOR) 10 MG tablet TAKE 1 TABLET (10 MG TOTAL) BY MOUTH DAILY.  Marland Kitchen lisinopril (PRINIVIL,ZESTRIL) 5 MG tablet Take 1 tablet (5 mg total) by mouth daily.  . magnesium oxide (MAG-OX) 400 MG tablet Take 400 mg by mouth daily.  . Red Yeast Rice Extract (RED YEAST RICE PO) Take 2 capsules by mouth at bedtime.   No facility-administered encounter medications on file as of 06/05/2015.     Activities of Daily Living In your present state of health, do you have any difficulty performing the following activities: 06/05/2015  Hearing? N  Vision? N  Difficulty concentrating or making decisions? N  Walking or climbing stairs? N  Dressing or bathing? N  Doing errands, shopping? N    Patient Care Team: Biagio Borg, MD as PCP - General Troy Sine, MD as Consulting Physician (Cardiology) Marvia Pickles (Cardiology) Suella Broad, MD as Consulting Physician (Physical Medicine and Rehabilitation)   Assessment:    Objective:   The goal of the wellness visit is to assist the patient how to close the gaps in care and create a preventative care plan for the patient.  Personalized Education was given regarding:   Pt determined a personalized goal; see patient goals;  Assessment included:  Taking meds without issues; no barriers identified Labs were and fup visit noted with MD if labs are due to be re-drawn. Stress: Recommendations for managing stress if assessed as a factor;  No Risk for hepatitis or high risk social behavior identified via hepatitis screen/ deferred due to time  Educated on Vaccines;  Safety issues reviewed; Cognition assessed by AD8; Score no loss of function    Exercise Activities and Dietary recommendations    Goals    . will exercise;      Refer cardiology; On his feet during the day; Discussed core exercises;       Fall Risk Fall Risk  06/05/2015 04/29/2014  Falls in the past year? No No   Depression Screen PHQ 2/9 Scores 06/05/2015 04/29/2014  PHQ - 2 Score 0 0    Cognitive Testing No flowsheet data found.  Immunization History  Administered Date(s) Administered  . Influenza Split 09/05/2012  . Pneumococcal Conjugate-13 04/29/2014  . Pneumococcal Polysaccharide-23 09/05/2012  . Td 05/14/2009  . Zoster 07/10/2009   Screening Tests Health Maintenance  Topic Date Due  . INFLUENZA VACCINE  07/06/2015  . COLONOSCOPY   06/29/2016  . TETANUS/TDAP  05/15/2019  . ZOSTAVAX  Completed  . PNA vac Low Risk Adult  Completed      Plan:     Plan     Advanced directive: Will complete but will get an attorney   During the course of the visit the patient was educated and counseled about the following appropriate screening and preventive services:   Vaccines to include Pneumoccal, Influenza, Hepatitis B, Td, Zostavax, HCV/ up to date  Electrocardiogram deferred to cardiology  Cardiovascular Disease/ reviewed HDL and exercise; according to back issues; refer to back doctor and cardiology  Colorectal cancer screening/ not due until 2017  Diabetes screening/ n/a  Prostate Cancer Screening / completed  Glaucoma screening/ completed this year  Nutrition counseling     Patient Instructions (the written plan) was given to the patient.    Wynetta Fines, RN  06/05/2015

## 2015-06-05 NOTE — Progress Notes (Signed)
Pre visit review using our clinic review tool, if applicable. No additional management support is needed unless otherwise documented below in the visit note. 

## 2015-06-26 ENCOUNTER — Encounter: Payer: Self-pay | Admitting: Gastroenterology

## 2016-04-11 ENCOUNTER — Encounter: Payer: Self-pay | Admitting: Gastroenterology

## 2016-04-12 ENCOUNTER — Encounter: Payer: Self-pay | Admitting: Internal Medicine

## 2016-04-12 ENCOUNTER — Ambulatory Visit (INDEPENDENT_AMBULATORY_CARE_PROVIDER_SITE_OTHER): Payer: Managed Care, Other (non HMO) | Admitting: Internal Medicine

## 2016-04-12 VITALS — BP 140/80 | HR 63 | Resp 20 | Wt 239.0 lb

## 2016-04-12 DIAGNOSIS — I10 Essential (primary) hypertension: Secondary | ICD-10-CM | POA: Diagnosis not present

## 2016-04-12 DIAGNOSIS — L03116 Cellulitis of left lower limb: Secondary | ICD-10-CM

## 2016-04-12 DIAGNOSIS — Z0000 Encounter for general adult medical examination without abnormal findings: Secondary | ICD-10-CM

## 2016-04-12 DIAGNOSIS — I480 Paroxysmal atrial fibrillation: Secondary | ICD-10-CM

## 2016-04-12 HISTORY — DX: Cellulitis of left lower limb: L03.116

## 2016-04-12 MED ORDER — SULFAMETHOXAZOLE-TRIMETHOPRIM 800-160 MG PO TABS: 1.0000 | ORAL_TABLET | Freq: Two times a day (BID) | ORAL | Status: DC

## 2016-04-12 MED ORDER — DOXYCYCLINE HYCLATE 100 MG PO TABS: 100.0000 mg | ORAL_TABLET | Freq: Two times a day (BID) | ORAL | Status: DC

## 2016-04-12 NOTE — Progress Notes (Signed)
Subjective:    Patient ID: Dustin Berry, male    DOB: [birth year], 70 y.o.   MRN: [id number]  HPI  Here with hx of MRSA cellulitis to right axilla last tx about 2 yrs ago, with c/o new infection to left knee area.  Spends some time on the floor at work on his knees, and did notice any cracks in the skin, but now with 2 days of relatively quicklly advancing red streak to medial left mid thigh starting from initial area of cellultiis to the left distal medial knee; no drainage, fever but with the red streak figured he should be treated.  Did well with doxy course last time  Pt denies chest pain, increased sob or doe, wheezing, orthopnea, PND, increased LE swelling, palpitations, dizziness or syncope.  Pt denies new neurological symptoms such as new headache, or facial or extremity weakness or numbness  No hx of DM Past Medical History  Diagnosis Date  . ALLERGIC RHINITIS 05/12/2008  . ESOPHAGEAL STRICTURE 03/24/2009  . HYPERLIPIDEMIA 08/18/2007  . INSOMNIA-SLEEP DISORDER-UNSPEC 05/14/2009  . Personal history of colonic polyps 05/12/2008  . Preventative health care 06/24/2011  . Boil of buttock 06/24/11  . Mitral valve prolapse 07/04/2011  . BPH (benign prostatic hypertrophy) 09/05/2012  . Chest pain with exertion presumed to be tachycardia related along with SOB 04/01/2013  . Abdominal pain 04/01/2013  . Hematuria, possible 04/01/2013  . OSA (obstructive sleep apnea) 07/04/2011    sleep study - average AHI 2.5  . Atrial fibrillation with RVR ([place]) 10/21/2012    Echo- EF 50-55%; mild concentric LVH; flow pattern suggestive of impaired LV relaxation; mild mitral valve prolapse, trace mitral regurgitation  . Neuromuscular disorder ([place])     peripheral neuropathy  . PERIPHERAL NEUROPATHY 05/12/2008  . Palpitations 03/06/2007    R/P MV - mild perfusion defect in basal inferoseptal, basal inferior, mid inferoseptal, and mid inferior regions, consistent w/ infarct/scar; no scintigraphic evidence of inducible  myocardial ischemia; prior non transmural infarct cannot be completely excluded; EF 48%; no significant change from previous study  . Atrial fibrillation ([place]) 03/15/2010    14 day event monitor - 1 episode of sinus bradycardia  . HTN (hypertension) 12/04/2013   Past Surgical History  Procedure Laterality Date  . Hand surgery      Thumb joint repair  . Tonsillectomy and adenoidectomy    . Upper gastrointestinal endoscopy      dilation  . Colonoscopy    . Polypectomy    . Tee without cardioversion N/A 04/03/2013    Procedure: TRANSESOPHAGEAL ECHOCARDIOGRAM (TEE);  Surgeon: Pixie Casino, MD;  Location: [facility] ENDOSCOPY;  Service: Cardiovascular;  Laterality: N/A;  . Cardioversion N/A 04/03/2013    Procedure: CARDIOVERSION;  Surgeon: Pixie Casino, MD;  Location: [facility] ENDOSCOPY;  Service: Cardiovascular;  Laterality: N/A;    reports that he has never smoked. He has never used smokeless tobacco. He reports that he drinks about 0.6 oz of alcohol per week. He reports that he does not use illicit drugs. family history includes Cancer in his sister; Dementia in his mother; Heart disease (age of onset: 83) in his father; Stroke in his maternal grandfather. No Known Allergies Current Outpatient Prescriptions on File Prior to Visit  Medication Sig Dispense Refill  . aspirin EC 81 MG EC tablet Take 1 tablet (81 mg total) by mouth daily.    Marland Kitchen atorvastatin (LIPITOR) 20 MG tablet Take 1 tablet (20 mg total) by mouth daily. 90 tablet 3  .  lisinopril (PRINIVIL,ZESTRIL) 5 MG tablet Take 1 tablet (5 mg total) by mouth daily. 90 tablet 3  . magnesium oxide (MAG-OX) 400 MG tablet Take 400 mg by mouth daily.    . Red Yeast Rice Extract (RED YEAST RICE PO) Take 2 capsules by mouth at bedtime.     No current facility-administered medications on file prior to visit.   Review of Systems  Constitutional: Negative for unusual diaphoresis or night sweats HENT: Negative for ear swelling or discharge Eyes:  Negative for worsening visual haziness  Respiratory: Negative for choking and stridor.   Gastrointestinal: Negative for distension or worsening eructation Genitourinary: Negative for retention or change in urine volume.  Musculoskeletal: Negative for other MSK pain or swelling Skin: Negative for color change and worsening wound Neurological: Negative for tremors and numbness other than noted  Psychiatric/Behavioral: Negative for decreased concentration or agitation other than above       Objective:   Physical Exam BP 140/80 mmHg  Pulse 63  Resp 20  Wt 239 lb (108.41 kg)  SpO2 97% VS noted, not ill appearing Constitutional: Pt appears in no apparent distress HENT: Head: NCAT.  Right Ear: External ear normal.  Left Ear: External ear normal.  Eyes: . Pupils are equal, round, and reactive to light. Conjunctivae and EOM are normal Neck: Normal range of motion. Neck supple.  Cardiovascular: Normal rate and regular rhythm.   Pulmonary/Chest: Effort normal and breath sounds without rales or wheezing.  Neurological: Pt is alert. Not confused , motor grossly intact Skin: Skin is warm. No rash, no LE edema but has left medial knee area 2 cm erythema/swelling but nonfluctuant and non draining, assoc with red streak and lesser swelling linearly to the left medial thigh to mid thigh only Psychiatric: Pt behavior is normal. No agitation.     Assessment & Plan:

## 2016-04-12 NOTE — Assessment & Plan Note (Signed)
Stable rate and volume, cont same tx,  to f/u any worsening symptoms or concerns  

## 2016-04-12 NOTE — Patient Instructions (Signed)
Please take all new medication as prescribed - the antibiotics  Please continue all other medications as before, and refills have been done if requested.  Please have the pharmacy call with any other refills you may need.  Please keep your appointments with your specialists as you may have planned  Please return in July 2017, or sooner if needed, with Lab testing done 3-5 days before

## 2016-04-12 NOTE — Assessment & Plan Note (Signed)
stable overall by history and exam, recent data reviewed with pt, and pt to continue medical treatment as before,  to f/u any worsening symptoms or concerns BP Readings from Last 3 Encounters:  04/12/16 140/80  06/05/15 124/90  04/29/14 108/78

## 2016-04-12 NOTE — Assessment & Plan Note (Signed)
Mild to mod, for antibx course doxy and septra to avoid any worsening abscess and knee joint involvement,  to f/u any worsening symptoms or concerns

## 2016-04-12 NOTE — Progress Notes (Signed)
Pre visit review using our clinic review tool, if applicable. No additional management support is needed unless otherwise documented below in the visit note. 

## 2016-06-01 ENCOUNTER — Other Ambulatory Visit (INDEPENDENT_AMBULATORY_CARE_PROVIDER_SITE_OTHER): Payer: Managed Care, Other (non HMO)

## 2016-06-01 DIAGNOSIS — Z0000 Encounter for general adult medical examination without abnormal findings: Secondary | ICD-10-CM

## 2016-06-01 LAB — PSA: PSA: 0.8 ng/mL (ref 0.10–4.00)

## 2016-06-01 LAB — CBC WITH DIFFERENTIAL/PLATELET
BASOS ABS: 0 10*3/uL (ref 0.0–0.1)
Basophils Relative: 0.5 % (ref 0.0–3.0)
EOS PCT: 3.7 % (ref 0.0–5.0)
Eosinophils Absolute: 0.2 10*3/uL (ref 0.0–0.7)
HCT: 46.2 % (ref 39.0–52.0)
HEMOGLOBIN: 15.5 g/dL (ref 13.0–17.0)
LYMPHS PCT: 34.2 % (ref 12.0–46.0)
Lymphs Abs: 1.7 10*3/uL (ref 0.7–4.0)
MCHC: 33.7 g/dL (ref 30.0–36.0)
MCV: 88.4 fl (ref 78.0–100.0)
MONOS PCT: 10.8 % (ref 3.0–12.0)
Monocytes Absolute: 0.5 10*3/uL (ref 0.1–1.0)
NEUTROS PCT: 50.8 % (ref 43.0–77.0)
Neutro Abs: 2.5 10*3/uL (ref 1.4–7.7)
Platelets: 270 10*3/uL (ref 150.0–400.0)
RBC: 5.22 Mil/uL (ref 4.22–5.81)
RDW: 14.7 % (ref 11.5–15.5)
WBC: 4.9 10*3/uL (ref 4.0–10.5)

## 2016-06-01 LAB — BASIC METABOLIC PANEL
BUN: 12 mg/dL (ref 6–23)
CALCIUM: 9.5 mg/dL (ref 8.4–10.5)
CO2: 28 meq/L (ref 19–32)
CREATININE: 1.03 mg/dL (ref 0.40–1.50)
Chloride: 108 mEq/L (ref 96–112)
GFR: 76.09 mL/min (ref >60.00)
GLUCOSE: 100 mg/dL — AB (ref 70–99)
Potassium: 4.6 mEq/L (ref 3.5–5.1)
SODIUM: 141 meq/L (ref 135–145)

## 2016-06-01 LAB — URINALYSIS, ROUTINE W REFLEX MICROSCOPIC
Bilirubin Urine: NEGATIVE
HGB URINE DIPSTICK: NEGATIVE
Ketones, ur: NEGATIVE
LEUKOCYTES UA: NEGATIVE
NITRITE: NEGATIVE
RBC / HPF: NONE SEEN (ref 0–?)
SPECIFIC GRAVITY, URINE: 1.01 (ref 1.000–1.030)
TOTAL PROTEIN, URINE-UPE24: NEGATIVE
UROBILINOGEN UA: 0.2 (ref 0.0–1.0)
Urine Glucose: NEGATIVE
WBC UA: NONE SEEN (ref 0–?)
pH: 6 (ref 5.0–8.0)

## 2016-06-01 LAB — HEPATIC FUNCTION PANEL
ALBUMIN: 4.3 g/dL (ref 3.5–5.2)
ALT: 18 U/L (ref 0–53)
AST: 19 U/L (ref 0–37)
Alkaline Phosphatase: 57 U/L (ref 39–117)
Bilirubin, Direct: 0.1 mg/dL (ref 0.0–0.3)
TOTAL PROTEIN: 6.9 g/dL (ref 6.0–8.3)
Total Bilirubin: 0.8 mg/dL (ref 0.2–1.2)

## 2016-06-01 LAB — TSH: TSH: 1.91 u[IU]/mL (ref 0.35–4.50)

## 2016-06-01 LAB — LIPID PANEL
CHOLESTEROL: 148 mg/dL (ref 0–200)
HDL: 43.8 mg/dL (ref >39.00)
LDL CALC: 83 mg/dL (ref 0–99)
NonHDL: 104.34
Total CHOL/HDL Ratio: 3
Triglycerides: 105 mg/dL (ref 0.0–149.0)
VLDL: 21 mg/dL (ref 0.0–40.0)

## 2016-06-08 ENCOUNTER — Ambulatory Visit (INDEPENDENT_AMBULATORY_CARE_PROVIDER_SITE_OTHER): Payer: Managed Care, Other (non HMO) | Admitting: Internal Medicine

## 2016-06-08 ENCOUNTER — Encounter: Payer: Self-pay | Admitting: Internal Medicine

## 2016-06-08 VITALS — BP 122/72 | HR 82 | Temp 98.5°F | Resp 20 | Wt 238.0 lb

## 2016-06-08 DIAGNOSIS — Z0000 Encounter for general adult medical examination without abnormal findings: Secondary | ICD-10-CM

## 2016-06-08 DIAGNOSIS — G4700 Insomnia, unspecified: Secondary | ICD-10-CM | POA: Diagnosis not present

## 2016-06-08 DIAGNOSIS — Z1159 Encounter for screening for other viral diseases: Secondary | ICD-10-CM | POA: Diagnosis not present

## 2016-06-08 DIAGNOSIS — Z86010 Personal history of colonic polyps: Secondary | ICD-10-CM | POA: Diagnosis not present

## 2016-06-08 DIAGNOSIS — Z0001 Encounter for general adult medical examination with abnormal findings: Secondary | ICD-10-CM

## 2016-06-08 DIAGNOSIS — I10 Essential (primary) hypertension: Secondary | ICD-10-CM

## 2016-06-08 DIAGNOSIS — Q851 Tuberous sclerosis: Secondary | ICD-10-CM

## 2016-06-08 DIAGNOSIS — R6889 Other general symptoms and signs: Secondary | ICD-10-CM

## 2016-06-08 HISTORY — DX: Tuberous sclerosis: Q85.1

## 2016-06-08 HISTORY — DX: Insomnia, unspecified: G47.00

## 2016-06-08 MED ORDER — ZOLPIDEM TARTRATE 5 MG PO TABS: 5.0000 mg | ORAL_TABLET | Freq: Every evening | ORAL | Status: DC | PRN

## 2016-06-08 NOTE — Progress Notes (Signed)
Pre visit review using our clinic review tool, if applicable. No additional management support is needed unless otherwise documented below in the visit note. 

## 2016-06-08 NOTE — Progress Notes (Signed)
Subjective:    Patient ID: Dustin Ferries., male    DOB: [birth year], 70 y.o.   MRN: [id number]  HPI   Here for wellness and f/u;  Overall doing ok;  Pt denies Chest pain, worsening SOB, DOE, wheezing, orthopnea, PND, worsening LE edema, palpitations, dizziness or syncope.  Pt denies neurological change such as new headache, facial or extremity weakness.  Pt denies polydipsia, polyuria, or low sugar symptoms. Pt states overall good compliance with treatment and medications, good tolerability, and has been trying to follow appropriate diet.  Pt denies worsening depressive symptoms, suicidal ideation or panic. No fever, night sweats, wt loss, loss of appetite, or other constitutional symptoms.  Pt states good ability with ADL's, has low fall risk, home safety reviewed and adequate, no other significant changes in hearing or vision, and only occasionally active with exercise. Due for f/u CT neck wk to reeval renal- tuberous sclerosis  . Does have some transient left lower back and left leg pain first thing in the am, seems to work itself out in less than1 hr, worse a month ago, then none for last 2 wks.  Has ongoing insomnia, asks for amben refill. Past Medical History  Diagnosis Date  . ALLERGIC RHINITIS 05/12/2008  . ESOPHAGEAL STRICTURE 03/24/2009  . HYPERLIPIDEMIA 08/18/2007  . INSOMNIA-SLEEP DISORDER-UNSPEC 05/14/2009  . Personal history of colonic polyps 05/12/2008  . Preventative health care 06/24/2011  . Boil of buttock 06/24/11  . Mitral valve prolapse 07/04/2011  . BPH (benign prostatic hypertrophy) 09/05/2012  . Chest pain with exertion presumed to be tachycardia related along with SOB 04/01/2013  . Abdominal pain 04/01/2013  . Hematuria, possible 04/01/2013  . OSA (obstructive sleep apnea) 07/04/2011    sleep study - average AHI 2.5  . Atrial fibrillation with RVR ([place]) 10/21/2012    Echo- EF 50-55%; mild concentric LVH; flow pattern suggestive of impaired LV relaxation; mild mitral valve  prolapse, trace mitral regurgitation  . Neuromuscular disorder ([place])     peripheral neuropathy  . PERIPHERAL NEUROPATHY 05/12/2008  . Palpitations 03/06/2007    R/P MV - mild perfusion defect in basal inferoseptal, basal inferior, mid inferoseptal, and mid inferior regions, consistent w/ infarct/scar; no scintigraphic evidence of inducible myocardial ischemia; prior non transmural infarct cannot be completely excluded; EF 48%; no significant change from previous study  . Atrial fibrillation ([place]) 03/15/2010    14 day event monitor - 1 episode of sinus bradycardia  . HTN (hypertension) 12/04/2013  . 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 Insomnia 06/08/2016   Past Surgical History  Procedure Laterality Date  . Hand surgery      Thumb joint repair  . Tonsillectomy and adenoidectomy    . Upper gastrointestinal endoscopy      dilation  . Colonoscopy    . Polypectomy    . Tee without cardioversion N/A 04/03/2013    Procedure: TRANSESOPHAGEAL ECHOCARDIOGRAM (TEE);  Surgeon: Pixie Casino, MD;  Location: [facility] ENDOSCOPY;  Service: Cardiovascular;  Laterality: N/A;  . Cardioversion N/A 04/03/2013    Procedure: CARDIOVERSION;  Surgeon: Pixie Casino, MD;  Location: [facility] ENDOSCOPY;  Service: Cardiovascular;  Laterality: N/A;    reports that he has never smoked. He has never used smokeless tobacco. He reports that he drinks about 0.6 oz of alcohol per week. He reports that he does not use illicit drugs. family history includes Cancer in his sister; Dementia in his mother; Heart disease (age of onset: 33) in his father; Stroke in his maternal  grandfather. No Known Allergies Current Outpatient Prescriptions on File Prior to Visit  Medication Sig Dispense Refill  . aspirin EC 81 MG EC tablet Take 1 tablet (81 mg total) by mouth daily.    Marland Kitchen atorvastatin (LIPITOR) 20 MG tablet Take 1 tablet (20 mg total) by mouth daily. 90 tablet 3  . lisinopril (PRINIVIL,ZESTRIL) 5 MG tablet Take 1 tablet (5 mg total) by  mouth daily. 90 tablet 3  . magnesium oxide (MAG-OX) 400 MG tablet Take 400 mg by mouth daily.    . Red Yeast Rice Extract (RED YEAST RICE PO) Take 2 capsules by mouth at bedtime.     No current facility-administered medications on file prior to visit.     Review of Systems Constitutional: Negative for increased diaphoresis, or other activity, appetite or siginficant weight change other than noted HENT: Negative for worsening hearing loss, ear pain, facial swelling, mouth sores and neck stiffness.   Eyes: Negative for other worsening pain, redness or visual disturbance.  Respiratory: Negative for choking or stridor Cardiovascular: Negative for other chest pain and palpitations.  Gastrointestinal: Negative for worsening diarrhea, blood in stool, or abdominal distention Genitourinary: Negative for hematuria, flank pain or change in urine volume.  Musculoskeletal: Negative for myalgias or other joint complaints.  Skin: Negative for other color change and wound or drainage.  Neurological: Negative for syncope and numbness. other than noted Hematological: Negative for adenopathy. or other swelling Psychiatric/Behavioral: Negative for hallucinations, SI, self-injury, decreased concentration or other worsening agitation.      Objective:   Physical Exam BP 122/72 mmHg  Pulse 82  Temp(Src) 98.5 F (36.9 C) (Oral)  Resp 20  Wt 238 lb (107.956 kg)  SpO2 97% VS noted,  Constitutional: Pt is oriented to person, place, and time. Appears well-developed and well-nourished, in no significant distress Head: Normocephalic and atraumatic  Eyes: Conjunctivae and EOM are normal. Pupils are equal, round, and reactive to light Right Ear: External ear normal.  Left Ear: External ear normal Nose: Nose normal.  Mouth/Throat: Oropharynx is clear and moist  Neck: Normal range of motion. Neck supple. No JVD present. No tracheal deviation present or significant neck LA or mass Cardiovascular: Normal rate,  regular rhythm, normal heart sounds and intact distal pulses.   Pulmonary/Chest: Effort normal and breath sounds without rales or wheezing  Abdominal: Soft. Bowel sounds are normal. NT. No HSM  Musculoskeletal: Normal range of motion. Exhibits no edema Lymphadenopathy: Has no cervical adenopathy.  Neurological: Pt is alert and oriented to person, place, and time. Pt has normal reflexes. No cranial nerve deficit. Motor grossly intact Skin: Skin is warm and dry. No rash noted or new ulcers Psychiatric:  Has normal mood and affect. Behavior is normal.   Lab Results  Component Value Date   WBC 4.9 06/01/2016   HGB 15.5 06/01/2016   HCT 46.2 06/01/2016   PLT 270.0 06/01/2016   GLUCOSE 100* 06/01/2016   CHOL 148 06/01/2016   TRIG 105.0 06/01/2016   HDL 43.80 06/01/2016   LDLCALC 83 06/01/2016   ALT 18 06/01/2016   AST 19 06/01/2016   NA 141 06/01/2016   K 4.6 06/01/2016   CL 108 06/01/2016   CREATININE 1.03 06/01/2016   BUN 12 06/01/2016   CO2 28 06/01/2016   TSH 1.91 06/01/2016   PSA 0.80 06/01/2016   INR 1.19 04/01/2013   Most recent echo summary: *[facility], [address]  [place], [place] [address]            [phone]  ------------------------------------------------------------ Transthoracic Echocardiography  Patient:  Dustin Berry, Dustin Berry MR #:    [id number] Study Date: 11/21/2013 Gender:   M Age:    69 Height:   198.1cm Weight:   112.9kg BSA:    2.70m^2 Pt. Status: Room:  ORDERING   Maxie Better ATTENDING  Quay Burow, MD REFERRING  Biagio Borg SONOGRAPHER Marygrace Drought, RCS PERFORMING  [facility] cc:  ------------------------------------------------------------ LV EF: 45% -  50%  ------------------------------------------------------------ Indications:   427.31 Atrial  Fibrillation.  ------------------------------------------------------------ History:  PMH: Sleep apnea  ------------------------------------------------------------ Study Conclusions  - Left ventricle: The cavity size was mildly dilated. Systolic function was mildly reduced. The estimated ejection fraction was in the range of 45% to 50%. Doppler parameters are consistent with abnormal left ventricular relaxation (grade 1 diastolic dysfunction). - Regional wall motion abnormality: Possible mild hypokinesis of the mid inferior myocardium. - Mitral valve: Elongation of the anterior leaflet. Systolic bowing without prolapse. Mild regurgitation. Valve area by pressure half-time: 2.37cm^2. - Atrial septum: No defect or patent foramen ovale was identified.    Assessment & Plan:

## 2016-06-08 NOTE — Patient Instructions (Addendum)
Please continue all other medications as before, and refills have been done if requested.  Please have the pharmacy call with any other refills you may need.  Please continue your efforts at being more active, low cholesterol diet, and weight control.  You are otherwise up to date with prevention measures today.  Please keep your appointments with your specialists as you may have planned  You will be contacted regarding the referral for: colonoscopy  Please return in 1 year for your yearly visit, or sooner if needed, with Lab testing done 3-5 days before  

## 2016-06-13 NOTE — Assessment & Plan Note (Signed)
stable overall by history and exam, recent data reviewed with pt, and pt to continue medical treatment as before,  to f/u any worsening symptoms or concerns BP Readings from Last 3 Encounters:  06/08/16 122/72  04/12/16 140/80  06/05/15 124/90

## 2016-06-13 NOTE — Assessment & Plan Note (Signed)

## 2016-06-13 NOTE — Assessment & Plan Note (Signed)
Ok for ambien 5 qhs prn,  to f/u any worsening symptoms or concerns 

## 2016-06-13 NOTE — Assessment & Plan Note (Signed)
Due for colonscopy f/u - will help arrange

## 2016-07-19 ENCOUNTER — Encounter: Payer: Self-pay | Admitting: Gastroenterology

## 2016-09-27 ENCOUNTER — Encounter: Payer: Managed Care, Other (non HMO) | Admitting: Gastroenterology

## 2016-10-07 ENCOUNTER — Telehealth: Payer: Self-pay | Admitting: *Deleted

## 2016-10-07 MED ORDER — DOXYCYCLINE HYCLATE 100 MG PO TABS
100.0000 mg | ORAL_TABLET | Freq: Two times a day (BID) | ORAL | 0 refills | Status: DC
Start: 2016-10-07 — End: 2017-06-15

## 2016-10-07 NOTE — Telephone Encounter (Signed)
Ok - done erx  [employer] or staff to inform pt

## 2016-10-07 NOTE — Telephone Encounter (Signed)
Rec'd call pt states the boil has came back would like MD to call in rx for doxycycline...Dustin Berry

## 2016-10-07 NOTE — Telephone Encounter (Signed)
Patient is aware 

## 2016-12-05 HISTORY — PX: COLONOSCOPY: SHX174

## 2016-12-17 ENCOUNTER — Other Ambulatory Visit: Payer: Self-pay | Admitting: Internal Medicine

## 2017-01-03 ENCOUNTER — Ambulatory Visit (INDEPENDENT_AMBULATORY_CARE_PROVIDER_SITE_OTHER): Payer: Managed Care, Other (non HMO) | Admitting: Cardiovascular Disease

## 2017-01-03 ENCOUNTER — Encounter: Payer: Self-pay | Admitting: Cardiovascular Disease

## 2017-01-03 VITALS — BP 130/86 | HR 73 | Ht 77.0 in | Wt 244.0 lb

## 2017-01-03 DIAGNOSIS — I10 Essential (primary) hypertension: Secondary | ICD-10-CM

## 2017-01-03 DIAGNOSIS — E785 Hyperlipidemia, unspecified: Secondary | ICD-10-CM | POA: Diagnosis not present

## 2017-01-03 DIAGNOSIS — I480 Paroxysmal atrial fibrillation: Secondary | ICD-10-CM | POA: Diagnosis not present

## 2017-01-03 DIAGNOSIS — I341 Nonrheumatic mitral (valve) prolapse: Secondary | ICD-10-CM | POA: Diagnosis not present

## 2017-01-03 DIAGNOSIS — G4733 Obstructive sleep apnea (adult) (pediatric): Secondary | ICD-10-CM

## 2017-01-03 NOTE — Progress Notes (Signed)
Patient ID: Dustin Berry., male   DOB: [birth year], 71 y.o.   MRN: [id number]     HPI: Dustin Berry. is a 71 y.o. male who presents for three-year followup cardiology evaluation.  I Last saw him in December 2014.   Dustin Berry has a long-standing history of paroxysmal atrial fibrillation. Initially in 2000 he was evaluated at [facility] and was treated at that time with flecainide. He subsequently developed breakthrough arrhythmia's and had done well with Rythmol SR and can, beta blocker. Remotely, he had seen Dr. Rosita Fire at [facility] as well as Dr. Glennon Mac at [facility] for consideration of atrial fibrillation ablation in 2011 at that time a decision not to have therapy. He had done well until recently but developed recurrent problems with recurrent atrial fibrillation despite medical therapy. He also has been diagnosed with obstructive sleep apnea and has been utilizing CPAP therapy with 100% compliance. He ultimately went back to [facility] and on June 6,2014 underwent pulmonary vein isolation of all pulmonary veins and radiofrequency ablation of CTI with bidirectional block noted with RA and CS pacing by Dr. Glennon Mac. He has been on eliquis 5 mg twice a day for anticoagulation. At that time he was taken off his multaq and metoprolol.  When I saw him in 2014 he started to notice white blood pressure elevation. He was unaware of any recurrent atrial fibrillation. He had been using his CPAP therapy. At that time, his blood pressure was 130/100. I elected to add low-dose lisinopril at 5 mg per day socially underwent a 2-D echo Doppler study on 11/21/2013. This showed an ejection fraction in the 45-50% range with grade 1 diastolic dysfunction. No definitive wall motion abnormalities were detected although the possibility was raised concerning possible mid inferior hypocontractility. He did have systolic bowing without definitive prolapse of his mitral valve with mild MR.   As I last saw him, in  June 2015.  He underwent a one-year follow-up evaluation at [facility] for his atrial fibrillation ablation.  He was maintaining sinus rhythm.  At that time, his anticoagulation was discontinued and he was resumed on baby aspirin for antiplatelet therapy.  He apparently has stopped using CPAP therapy over the past 3 years.  He denies any issues with his sleep.  He is unaware of any significant recurrent palpitations.  He has been monitoring his blood pressure at home and he states this is typically been running approximately 7:48 systolically.  He denies chest pain or palpitations.  He has back issues which has limited his exercise such that he predominantly just walks.  He no longer plays basketball.  He continues to be on lisinopril 5 mg for hypertension, atorvastatin 20, hyperlipidemia, and since his ablation has been on supplemental magnesium.  Magnesium 40 mg of Mag-Ox daily.  Past Medical History:  Diagnosis Date  . Abdominal pain 04/01/2013  . ALLERGIC RHINITIS 05/12/2008  . Atrial fibrillation ([place]) 03/15/2010   14 day event monitor - 1 episode of sinus bradycardia  . Atrial fibrillation with RVR ([place]) 10/21/2012   Echo- EF 50-55%; mild concentric LVH; flow pattern suggestive of impaired LV relaxation; mild mitral valve prolapse, trace mitral regurgitation  . Boil of buttock 06/24/11  . BPH (benign prostatic hypertrophy) 09/05/2012  . Chest pain with exertion presumed to be tachycardia related along with SOB 04/01/2013  . ESOPHAGEAL STRICTURE 03/24/2009  . Hematuria, possible 04/01/2013  . HTN (hypertension) 12/04/2013  . HYPERLIPIDEMIA 08/18/2007  . Insomnia 06/08/2016  . INSOMNIA-SLEEP  [place] 05/14/2009  . Mitral valve prolapse 07/04/2011  . Neuromuscular disorder ([place])    peripheral neuropathy  . OSA (obstructive sleep apnea) 07/04/2011   sleep study - average AHI 2.5  . Palpitations 03/06/2007   R/P MV - mild perfusion defect in basal inferoseptal, basal inferior, mid inferoseptal, and mid  inferior regions, consistent w/ infarct/scar; no scintigraphic evidence of inducible myocardial ischemia; prior non transmural infarct cannot be completely excluded; EF 48%; no significant change from previous study  . PERIPHERAL NEUROPATHY 05/12/2008  . Personal history of colonic polyps 05/12/2008  . Preventative health care 06/24/2011  . 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 CARDIOVERSION N/A 04/03/2013   Procedure: CARDIOVERSION;  Surgeon: Pixie Casino, MD;  Location: [facility] ENDOSCOPY;  Service: Cardiovascular;  Laterality: N/A;  . COLONOSCOPY    . HAND SURGERY     Thumb joint repair  . POLYPECTOMY    . TEE WITHOUT CARDIOVERSION N/A 04/03/2013   Procedure: TRANSESOPHAGEAL ECHOCARDIOGRAM (TEE);  Surgeon: Pixie Casino, MD;  Location: [facility] ENDOSCOPY;  Service: Cardiovascular;  Laterality: N/A;  . TONSILLECTOMY AND ADENOIDECTOMY    . UPPER GASTROINTESTINAL ENDOSCOPY     dilation    No Known Allergies  Current Outpatient Prescriptions  Medication Sig Dispense Refill  . aspirin EC 81 MG EC tablet Take 1 tablet (81 mg total) by mouth daily.    Marland Kitchen atorvastatin (LIPITOR) 20 MG tablet Take 1 tablet (20 mg total) by mouth daily. 90 tablet 3  . doxycycline (VIBRA-TABS) 100 MG tablet Take 1 tablet (100 mg total) by mouth 2 (two) times daily. 20 tablet 0  . lisinopril (PRINIVIL,ZESTRIL) 5 MG tablet TAKE 1 TABLET (5 MG TOTAL) BY MOUTH DAILY. 90 tablet 1  . magnesium oxide (MAG-OX) 400 MG tablet Take 400 mg by mouth daily.    . Red Yeast Rice Extract (RED YEAST RICE PO) Take 2 capsules by mouth at bedtime.    Marland Kitchen zolpidem (AMBIEN) 5 MG tablet Take 1 tablet (5 mg total) by mouth at bedtime as needed for sleep. 90 tablet 1   No current facility-administered medications for this visit.     Social History   Social History  . Marital status: Married    Spouse name: N/A  . Number of children: N/A  . Years of education: N/A   Occupational History    . semi-retired realestate, former self employed tech co support    Social History Main Topics  . Smoking status: Never Smoker  . Smokeless tobacco: Never Used  . Alcohol use 0.6 oz/week    1 Glasses of wine per week  . Drug use: No  . Sexual activity: Not on file   Other Topics Concern  . Not on file   Social History Narrative  . No narrative on file    Family History  Problem Relation Age of Onset  . Heart disease Father 48    died with MI  . Cancer Sister     Melanoma  . Dementia Mother   . Stroke Maternal Grandfather    Social history is notable in that he is married has 4 children. He never smoked cigarettes. He does try to exercise. Previously had played basketball. 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    ROS General: Negative; No fevers, chills, or night sweats;  HEENT: Negative; No changes in vision or hearing, sinus congestion, difficulty swallowing Pulmonary: Negative; No cough,  wheezing, shortness of breath, hemoptysis Cardiovascular: See history of present illness GI: Negative; No nausea, vomiting, diarrhea, or abdominal pain GU: Negative; No dysuria, hematuria, or difficulty voiding Musculoskeletal: Negative; no myalgias, joint pain, or weakness Hematologic/Oncology: Negative; no easy bruising, bleeding Endocrine: Negative; no heat/cold intolerance; no diabetes Neuro: Negative; no changes in balance, headaches Skin: Negative; No rashes or skin lesions Psychiatric: Negative; No behavioral problems, depression Sleep: Positive for obstructive sleep apnea.  He plans to reinstitute therapy and had been on CPAP therapy in the past.  No snoring, daytime sleepiness, hypersomnolence, bruxism, restless legs, hypnogognic hallucinations, no cataplexy Other comprehensive 14 point system review is negative.   PE BP 130/86   Pulse 73   Ht _0  (1.956 m)   Wt 244 lb (110.7 kg)   BMI 28.93 kg/m   Repeat blood pressure by me was  122/72  Wt Readings from Last 3 Encounters:  01/03/17 244 lb (110.7 kg)  06/08/16 238 lb (108 kg)  04/12/16 239 lb (108.4 kg)   General: Alert, oriented, no distress.  Skin: normal turgor, no rashes HEENT: Normocephalic, atraumatic. Pupils round and reactive; sclera anicteric;no lid lag.  Nose without nasal septal hypertrophy Mouth/Parynx benign; Mallinpatti scale 3 Neck: No JVD, no carotid briuts Lungs: clear to ausculatation and percussion; no wheezing or rales Heart: RRR, s1 s2 normal no S3 or S4 gallop. 1/6 systolic murmur. No audible systolic click Abdomen: soft, nontender; no hepatosplenomehaly, BS+; abdominal aorta nontender and not dilated by palpation. Pulses 2+ Extremities: no clubbing cyanosis or edema, Homan's sign negative  Neurologic: grossly nonfocal Psychologic: normal affect and mood.  ECG (independently read by me): Normal sinus rhythm at 73 bpm.  PR interval 170 ms, QTc interval 423 ms.  Nonspecific ST-T changes.  December 2014 ECG: Normal sinus rhythm at 66 beats per minute. Nonspecific ST-T changes. QTc interval 429 ms.  LABS:  BMP Latest Ref Rng & Units 06/01/2016 05/29/2015 04/30/2014  Glucose 70 - 99 mg/dL 100(H) 97 80  BUN 6 - 23 mg/dL _1 Creatinine 0.40 - 1.50 mg/dL 1.03 1.03 1.2  Sodium 135 - 145 mEq/L 141 141 138  Potassium 3.5 - 5.1 mEq/L 4.6 4.4 4.6  Chloride 96 - 112 mEq/L 108 108 107  CO2 19 - 32 mEq/L _2 Calcium 8.4 - 10.5 mg/dL 9.5 9.2 9.5   Hepatic Function Latest Ref Rng & Units 06/01/2016 05/29/2015 04/30/2014  Total Protein 6.0 - 8.3 g/dL 6.9 6.6 6.8  Albumin 3.5 - 5.2 g/dL 4.3 3.9 4.0  AST 0 - 37 U/L _3 ALT 0 - 53 U/L _4 Alk Phosphatase 39 - 117 U/L 57 60 50  Total Bilirubin 0.2 - 1.2 mg/dL 0.8 0.5 1.0  Bilirubin, Direct 0.0 - 0.3 mg/dL 0.1 0.1 0.1   CBC Latest Ref Rng & Units 06/01/2016 05/29/2015 04/30/2014  WBC 4.0 - 10.5 K/uL 4.9 4.7 4.2  Hemoglobin 13.0 - 17.0 g/dL 15.5 15.4 15.4  Hematocrit 39.0 - 52.0 %  46.2 45.4 46.1  Platelets 150.0 - 400.0 K/uL 270.0 263.0 239.0   Lab Results  Component Value Date   MCV 88.4 06/01/2016   MCV 89.0 05/29/2015   MCV 89.6 04/30/2014   Lab Results  Component Value Date   TSH 1.91 06/01/2016   Lipid Panel     Component Value Date/Time   CHOL 148 06/01/2016 0936   TRIG 105.0 06/01/2016 0936   HDL 43.80 06/01/2016 0936   CHOLHDL 3  06/01/2016 0936   VLDL 21.0 06/01/2016 0936   LDLCALC 83 06/01/2016 0936    RADIOLOGY: No results found.  IMPRESSION:  1. Mitral valve prolapse   2. PAF (paroxysmal atrial fibrillation) ([place])   3. Essential hypertension   4. Hyperlipidemia, unspecified hyperlipidemia type   5. OSA (obstructive sleep apnea)     ASSESSMENT AND PLAN: Dustin Berry Is a 71 year old Caucasian male who is a history of paroxysmal atrial fibrillation dating back to 2000 when he was initiated on therapy at [facility].  He underwent successful radiofrequency AF ablation with pulmonary vein isolation  n June 2014 by Dr. Norm Salt at [facility]. He is maintaining sinus rhythm.  He had been on eliquis anticoagulation and this was discontinued by his Dana and now he is just on aspirin 81 mg.  Has a history of mild hypertension and has been maintained on lisinopril 5 mg.  His blood pressure today is stable.  I discussed with him the new Heart Association pressure guidelines.  He has tolerated the institution of lisinopril from his last office visit and is at a dose of just 5 mg daily with blood pressure now normal. His last echo Doppler study in several 2014 showed an ejection fraction of 45-50%. He is asymptomatic without chest pain. However, the echo Doppler study raised of the possibility of midinferior hypokinesis. On his prio echo study in November 2013 there was no mention of potential wall motion abnormality at that time ejection fraction was 50-55%.  The past 3 years, he has not been consistently using CPAP and has not used  essentially over the past several years.  He is in need for new supplies.  I discussed with him the importance of continued CPAP use, particularly with his history of atrial fibrillation and mild hypertension.  I am checking a complete set of laboratory consisting of a comprehensive metabolic panel, magnesium level, CBC, TSH, and lipid studies.  I discussed increased exercise regimen as his back will tolerate.  Hopefully he will be able to walk at least 5 days per week for minimum of 30 minutes.  Since his been over 3 years since his last echo Doppler study.  I will refer him for follow-up evaluation to reassess systolic and diastolic function, his mitral valve prolapse, and wall motion.  I will contact him regarding the results.  Try to arrange for him to get new CPAP supplies.  I will see him in one year for reevaluation.  Time spent: 25 minutes  Troy Sine, MD, [facility]  01/03/2017 10:25 AM

## 2017-01-03 NOTE — Patient Instructions (Signed)
Medication Instructions:   NO CHANGES  Labwork:  CBC, MAGNESIUM, LIPID, CMET, TSH  Testing/Procedures:  Your physician has requested that you have an echocardiogram. Echocardiography is a painless test that uses sound waves to create images of your heart. It provides your doctor with information about the size and shape of your heart and how well your heart's chambers and valves are working. This procedure takes approximately one hour. There are no restrictions for this procedure.    Follow-Up:  1 YEAR  Any Other Special Instructions Will Be Listed Below (If Applicable).

## 2017-01-12 LAB — COMPREHENSIVE METABOLIC PANEL
ALBUMIN: 4 g/dL (ref 3.6–5.1)
ALK PHOS: 59 U/L (ref 40–115)
ALT: 18 U/L (ref 9–46)
AST: 18 U/L (ref 10–35)
BUN: 16 mg/dL (ref 7–25)
CO2: 26 mmol/L (ref 20–31)
CREATININE: 1.09 mg/dL (ref 0.70–1.25)
Calcium: 9.1 mg/dL (ref 8.6–10.3)
Chloride: 109 mmol/L (ref 98–110)
Glucose, Bld: 94 mg/dL (ref 65–99)
POTASSIUM: 4.7 mmol/L (ref 3.5–5.3)
Sodium: 141 mmol/L (ref 135–146)
TOTAL PROTEIN: 6.9 g/dL (ref 6.1–8.1)
Total Bilirubin: 0.5 mg/dL (ref 0.2–1.2)

## 2017-01-12 LAB — CBC
HCT: 46.5 % (ref 38.5–50.0)
HEMOGLOBIN: 15.5 g/dL (ref 13.2–17.1)
MCH: 29.6 pg (ref 27.0–33.0)
MCHC: 33.3 g/dL (ref 32.0–36.0)
MCV: 88.9 fL (ref 80.0–100.0)
MPV: 8.5 fL (ref 7.5–12.5)
PLATELETS: 203 10*3/uL (ref 140–400)
RBC: 5.23 MIL/uL (ref 4.20–5.80)
RDW: 14.4 % (ref 11.0–15.0)
WBC: 4.4 10*3/uL (ref 3.8–10.8)

## 2017-01-12 LAB — LIPID PANEL
CHOL/HDL RATIO: 2.9 ratio (ref <5.0)
CHOLESTEROL: 135 mg/dL (ref <200)
HDL: 46 mg/dL (ref >40)
LDL Cholesterol: 78 mg/dL (ref <100)
Triglycerides: 54 mg/dL (ref <150)
VLDL: 11 mg/dL (ref <30)

## 2017-01-12 LAB — TSH: TSH: 2.72 m[IU]/L (ref 0.40–4.50)

## 2017-01-12 LAB — MAGNESIUM: Magnesium: 2 mg/dL (ref 1.5–2.5)

## 2017-01-16 ENCOUNTER — Encounter: Payer: Self-pay | Admitting: *Deleted

## 2017-01-16 ENCOUNTER — Telehealth: Payer: Self-pay | Admitting: *Deleted

## 2017-01-16 NOTE — Telephone Encounter (Signed)
Patient notified of lab results. He requested for a copy to be mailed to him.

## 2017-01-16 NOTE — Telephone Encounter (Signed)
-----   Message from Troy Sine, MD sent at 01/16/2017  7:44 AM EST ----- labs are excellent.

## 2017-01-17 ENCOUNTER — Ambulatory Visit (HOSPITAL_COMMUNITY): Payer: 59 | Attending: Cardiology

## 2017-01-17 ENCOUNTER — Other Ambulatory Visit: Payer: Self-pay

## 2017-01-17 DIAGNOSIS — E785 Hyperlipidemia, unspecified: Secondary | ICD-10-CM | POA: Diagnosis not present

## 2017-01-17 DIAGNOSIS — I4891 Unspecified atrial fibrillation: Secondary | ICD-10-CM | POA: Diagnosis not present

## 2017-01-17 DIAGNOSIS — I341 Nonrheumatic mitral (valve) prolapse: Secondary | ICD-10-CM | POA: Diagnosis present

## 2017-01-17 DIAGNOSIS — I10 Essential (primary) hypertension: Secondary | ICD-10-CM | POA: Insufficient documentation

## 2017-01-17 DIAGNOSIS — I081 Rheumatic disorders of both mitral and tricuspid valves: Secondary | ICD-10-CM | POA: Diagnosis not present

## 2017-01-23 ENCOUNTER — Telehealth: Payer: Self-pay | Admitting: *Deleted

## 2017-01-23 NOTE — Telephone Encounter (Signed)
-----   Message from Troy Sine, MD sent at 01/21/2017 10:05 AM EST ----- EF low nl at 50 -55%; Mitral Valve Prolapse with mild MR; mild atrial dilation;; Aortic rootis mildy dilated at 4.5 cm;  Will re-asses AO with CT angio

## 2017-01-23 NOTE — Telephone Encounter (Signed)
Left message for pt to call.

## 2017-01-31 NOTE — Telephone Encounter (Signed)
Left message to return a call. 

## 2017-02-09 ENCOUNTER — Telehealth: Payer: Self-pay | Admitting: Cardiovascular Disease

## 2017-02-09 NOTE — Telephone Encounter (Signed)
New Message    Please call he is returning your call about results

## 2017-02-17 NOTE — Telephone Encounter (Signed)
Patient notified of echo results and recommendations. 

## 2017-02-21 ENCOUNTER — Other Ambulatory Visit: Payer: Self-pay | Admitting: *Deleted

## 2017-02-21 DIAGNOSIS — I712 Thoracic aortic aneurysm, without rupture: Secondary | ICD-10-CM

## 2017-02-21 DIAGNOSIS — I7121 Aneurysm of the ascending aorta, without rupture: Secondary | ICD-10-CM

## 2017-02-21 NOTE — Telephone Encounter (Signed)
CT angio chest aorta ordered. Message sent to CVD/admin to schedule and call patient with appointment details.

## 2017-02-23 ENCOUNTER — Other Ambulatory Visit: Payer: Self-pay | Admitting: *Deleted

## 2017-02-23 DIAGNOSIS — Z79899 Other long term (current) drug therapy: Secondary | ICD-10-CM

## 2017-03-01 ENCOUNTER — Telehealth: Payer: Self-pay | Admitting: *Deleted

## 2017-03-01 DIAGNOSIS — Z01818 Encounter for other preprocedural examination: Secondary | ICD-10-CM

## 2017-03-01 DIAGNOSIS — Z79899 Other long term (current) drug therapy: Secondary | ICD-10-CM

## 2017-03-01 LAB — BASIC METABOLIC PANEL
BUN: 18 mg/dL (ref 7–25)
CALCIUM: 9.3 mg/dL (ref 8.6–10.3)
CO2: 23 mmol/L (ref 20–31)
CREATININE: 1.13 mg/dL (ref 0.70–1.25)
Chloride: 111 mmol/L — ABNORMAL HIGH (ref 98–110)
Glucose, Bld: 93 mg/dL (ref 65–99)
Potassium: 4.6 mmol/L (ref 3.5–5.3)
Sodium: 141 mmol/L (ref 135–146)

## 2017-03-01 NOTE — Telephone Encounter (Signed)
Lab result are in the patient chart for test tomorrow

## 2017-03-01 NOTE — Telephone Encounter (Signed)
Need to change BMP  Priority to STAT- PATIENT UNABLE TO HAVE LABS DONE UNTIL THE END OF THE DAY AND THE RESULT ARE NEED FOR FIRST THING IN THE MORNING  ORDER PLACED

## 2017-03-02 ENCOUNTER — Ambulatory Visit (INDEPENDENT_AMBULATORY_CARE_PROVIDER_SITE_OTHER)
Admission: RE | Admit: 2017-03-02 | Discharge: 2017-03-02 | Disposition: A | Discharge status: Home / Self Care | Payer: 59 | Source: Ambulatory Visit | Attending: Cardiovascular Disease | Admitting: Cardiovascular Disease

## 2017-03-02 DIAGNOSIS — I712 Thoracic aortic aneurysm, without rupture: Secondary | ICD-10-CM

## 2017-03-02 DIAGNOSIS — I7121 Aneurysm of the ascending aorta, without rupture: Secondary | ICD-10-CM

## 2017-03-02 MED ORDER — IOPAMIDOL (ISOVUE-370) INJECTION 76%
100.0000 mL | Freq: Once | INTRAVENOUS | Status: AC | PRN
  Administered 2017-03-02: 100 mL via INTRAVENOUS

## 2017-03-06 ENCOUNTER — Other Ambulatory Visit: Payer: Self-pay | Admitting: *Deleted

## 2017-03-06 DIAGNOSIS — I712 Thoracic aortic aneurysm, without rupture, unspecified: Secondary | ICD-10-CM

## 2017-05-22 ENCOUNTER — Encounter: Payer: Self-pay | Admitting: Gastroenterology

## 2017-05-30 ENCOUNTER — Other Ambulatory Visit: Payer: Self-pay | Admitting: Internal Medicine

## 2017-06-15 ENCOUNTER — Encounter: Payer: Self-pay | Admitting: Internal Medicine

## 2017-06-15 ENCOUNTER — Ambulatory Visit (INDEPENDENT_AMBULATORY_CARE_PROVIDER_SITE_OTHER): Payer: 59 | Admitting: Internal Medicine

## 2017-06-15 VITALS — BP 128/88 | HR 68 | Ht 77.0 in | Wt 239.0 lb

## 2017-06-15 DIAGNOSIS — Z1159 Encounter for screening for other viral diseases: Secondary | ICD-10-CM

## 2017-06-15 DIAGNOSIS — Z0000 Encounter for general adult medical examination without abnormal findings: Secondary | ICD-10-CM | POA: Diagnosis not present

## 2017-06-15 MED ORDER — ATORVASTATIN CALCIUM 20 MG PO TABS: 20.0000 mg | ORAL_TABLET | Freq: Every day | ORAL | 3 refills | Status: DC

## 2017-06-15 MED ORDER — ZOSTER VAC RECOMB ADJUVANTED 50 MCG/0.5ML IM SUSR: 0.5000 mL | Freq: Once | INTRAMUSCULAR | 1 refills | Status: AC

## 2017-06-15 MED ORDER — LISINOPRIL 5 MG PO TABS: 5.0000 mg | ORAL_TABLET | Freq: Every day | ORAL | 3 refills | Status: DC

## 2017-06-15 NOTE — Patient Instructions (Signed)

## 2017-06-15 NOTE — Progress Notes (Signed)
Subjective:    Patient ID: Dustin Ferries., male    DOB: [birth year], 71 y.o.   MRN: [id number]  HPI  Here for wellness and f/u;  Overall doing ok;  Pt denies Chest pain, worsening SOB, DOE, wheezing, orthopnea, PND, worsening LE edema, palpitations, dizziness or syncope.  Pt denies neurological change such as new headache, facial or extremity weakness.  Pt denies polydipsia, polyuria, or low sugar symptoms. Pt states overall good compliance with treatment and medications, good tolerability, and has been trying to follow appropriate diet.  Pt denies worsening depressive symptoms, suicidal ideation or panic. No fever, night sweats, wt loss, loss of appetite, or other constitutional symptoms.  Pt states good ability with ADL's, has low fall risk, home safety reviewed and adequate, no other significant changes in hearing or vision, and only occasionally active with exercise. Sees [facility] for ESI to neck and LBP.  Has colonoscopy scheduled for Aug 2018.  Past Medical History:  Diagnosis Date  . Abdominal pain 04/01/2013  . ALLERGIC RHINITIS 05/12/2008  . Atrial fibrillation ([place]) 03/15/2010   14 day event monitor - 1 episode of sinus bradycardia  . Atrial fibrillation with RVR ([place]) 10/21/2012   Echo- EF 50-55%; mild concentric LVH; flow pattern suggestive of impaired LV relaxation; mild mitral valve prolapse, trace mitral regurgitation  . Boil of buttock 06/24/11  . BPH (benign prostatic hypertrophy) 09/05/2012  . Chest pain with exertion presumed to be tachycardia related along with SOB 04/01/2013  . ESOPHAGEAL STRICTURE 03/24/2009  . Hematuria, possible 04/01/2013  . HTN (hypertension) 12/04/2013  . HYPERLIPIDEMIA 08/18/2007  . Insomnia 06/08/2016  . INSOMNIA-SLEEP DISORDER-UNSPEC 05/14/2009  . Mitral valve prolapse 07/04/2011  . Neuromuscular disorder ([place])    peripheral neuropathy  . OSA (obstructive sleep apnea) 07/04/2011   sleep study - average AHI 2.5  . Palpitations 03/06/2007   R/P MV -  mild perfusion defect in basal inferoseptal, basal inferior, mid inferoseptal, and mid inferior regions, consistent w/ infarct/scar; no scintigraphic evidence of inducible myocardial ischemia; prior non transmural infarct cannot be completely excluded; EF 48%; no significant change from previous study  . PERIPHERAL NEUROPATHY 05/12/2008  . Personal history of colonic polyps 05/12/2008  . Preventative health care 06/24/2011  . 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 CARDIOVERSION N/A 04/03/2013   Procedure: CARDIOVERSION;  Surgeon: Pixie Casino, MD;  Location: [facility] ENDOSCOPY;  Service: Cardiovascular;  Laterality: N/A;  . COLONOSCOPY    . HAND SURGERY     Thumb joint repair  . POLYPECTOMY    . TEE WITHOUT CARDIOVERSION N/A 04/03/2013   Procedure: TRANSESOPHAGEAL ECHOCARDIOGRAM (TEE);  Surgeon: Pixie Casino, MD;  Location: [facility] ENDOSCOPY;  Service: Cardiovascular;  Laterality: N/A;  . TONSILLECTOMY AND ADENOIDECTOMY    . UPPER GASTROINTESTINAL ENDOSCOPY     dilation    reports that he has never smoked. He has never used smokeless tobacco. He reports that he drinks about 0.6 oz of alcohol per week . He reports that he does not use drugs. family history includes Cancer in his sister; Dementia in his mother; Heart disease (age of onset: 50) in his father; Stroke in his maternal grandfather. No Known Allergies Current Outpatient Prescriptions on File Prior to Visit  Medication Sig Dispense Refill  . aspirin EC 81 MG EC tablet Take 1 tablet (81 mg total) by mouth daily.    Marland Kitchen zolpidem (AMBIEN) 5 MG tablet Take 1 tablet (5 mg total)  by mouth at bedtime as needed for sleep. 90 tablet 1   No current facility-administered medications on file prior to visit.    Review of Systems Constitutional: Negative for other unusual diaphoresis, sweats, appetite or weight changes HENT: Negative for other worsening hearing loss, ear pain, facial swelling, mouth  sores or neck stiffness.   Eyes: Negative for other worsening pain, redness or other visual disturbance.  Respiratory: Negative for other stridor or swelling Cardiovascular: Negative for other palpitations or other chest pain  Gastrointestinal: Negative for worsening diarrhea or loose stools, blood in stool, distention or other pain Genitourinary: Negative for hematuria, flank pain or other change in urine volume.  Musculoskeletal: Negative for myalgias or other joint swelling.  Skin: Negative for other color change, or other wound or worsening drainage.  Neurological: Negative for other syncope or numbness. Hematological: Negative for other adenopathy or swelling Psychiatric/Behavioral: Negative for hallucinations, other worsening agitation, SI, self-injury, or new decreased concentration All other system neg per pt    Objective:   Physical Exam BP 128/88   Pulse 68   Ht 6\' 5"  (1.956 m)   Wt 239 lb (108.4 kg)   SpO2 98%   BMI 28.34 kg/m  VS noted,  Constitutional: Pt is oriented to person, place, and time. Appears well-developed and well-nourished, in no significant distress and comfortable Head: Normocephalic and atraumatic  Eyes: Conjunctivae and EOM are normal. Pupils are equal, round, and reactive to light Right Ear: External ear normal without discharge Left Ear: External ear normal without discharge Nose: Nose without discharge or deformity Mouth/Throat: Oropharynx is without other ulcerations and moist  Neck: Normal range of motion. Neck supple. No JVD present. No tracheal deviation present or significant neck LA or mass Cardiovascular: Normal rate, regular rhythm, normal heart sounds and intact distal pulses.   Pulmonary/Chest: WOB normal and breath sounds without rales or wheezing  Abdominal: Soft. Bowel sounds are normal. NT. No HSM  Musculoskeletal: Normal range of motion. Exhibits no edema Lymphadenopathy: Has no other cervical adenopathy.  Neurological: Pt is alert  and oriented to person, place, and time. Pt has normal reflexes. No cranial nerve deficit. Motor grossly intact, Gait intact Skin: Skin is warm and dry. No rash noted or new ulcerations Psychiatric:  Has normal mood and affect. Behavior is normal without agitation No other exam findings Lab Results  Component Value Date   WBC 4.4 01/12/2017   HGB 15.5 01/12/2017   HCT 46.5 01/12/2017   PLT 203 01/12/2017   GLUCOSE 93 03/01/2017   CHOL 135 01/12/2017   TRIG 54 01/12/2017   HDL 46 01/12/2017   LDLCALC 78 01/12/2017   ALT 18 01/12/2017   AST 18 01/12/2017   NA 141 03/01/2017   K 4.6 03/01/2017   CL 111 (H) 03/01/2017   CREATININE 1.13 03/01/2017   BUN 18 03/01/2017   CO2 23 03/01/2017   TSH 2.72 01/12/2017   PSA 0.80 06/01/2016   INR 1.19 04/01/2013       Assessment & Plan:

## 2017-06-17 NOTE — Assessment & Plan Note (Signed)

## 2017-06-20 ENCOUNTER — Encounter: Payer: Self-pay | Admitting: Gastroenterology

## 2017-06-20 ENCOUNTER — Ambulatory Visit (AMBULATORY_SURGERY_CENTER): Payer: Self-pay

## 2017-06-20 VITALS — Ht 78.0 in | Wt 237.2 lb

## 2017-06-20 DIAGNOSIS — Z86010 Personal history of colonic polyps: Secondary | ICD-10-CM

## 2017-06-20 MED ORDER — NA SULFATE-K SULFATE-MG SULF 17.5-3.13-1.6 GM/177ML PO SOLN: 1.0000 | Freq: Once | ORAL | 0 refills | Status: AC

## 2017-06-20 NOTE — Progress Notes (Signed)
Denies allergies to eggs or soy products. Denies complication of anesthesia or sedation. Denies use of weight loss medication. Denies use of O2.   Emmi instructions given for colonoscopy.  

## 2017-07-04 ENCOUNTER — Ambulatory Visit (AMBULATORY_SURGERY_CENTER): Payer: 59 | Admitting: Gastroenterology

## 2017-07-04 ENCOUNTER — Encounter: Payer: Self-pay | Admitting: Gastroenterology

## 2017-07-04 VITALS — BP 106/68 | HR 63 | Temp 97.5°F | Resp 12 | Ht 78.0 in | Wt 237.0 lb

## 2017-07-04 DIAGNOSIS — K635 Polyp of colon: Secondary | ICD-10-CM

## 2017-07-04 DIAGNOSIS — D124 Benign neoplasm of descending colon: Secondary | ICD-10-CM

## 2017-07-04 DIAGNOSIS — Z86010 Personal history of colonic polyps: Secondary | ICD-10-CM

## 2017-07-04 DIAGNOSIS — D126 Benign neoplasm of colon, unspecified: Secondary | ICD-10-CM | POA: Diagnosis not present

## 2017-07-04 DIAGNOSIS — D123 Benign neoplasm of transverse colon: Secondary | ICD-10-CM

## 2017-07-04 DIAGNOSIS — D122 Benign neoplasm of ascending colon: Secondary | ICD-10-CM

## 2017-07-04 MED ORDER — SODIUM CHLORIDE 0.9 % IV SOLN: 500.0000 mL | INTRAVENOUS | Status: AC

## 2017-07-04 NOTE — Patient Instructions (Signed)
   YOU HAD AN ENDOSCOPIC PROCEDURE TODAY AT [facility]:   Refer to the procedure report that was given to you for any specific questions about what was found during the examination.  If the procedure report does not answer your questions, please call your gastroenterologist to clarify.  If you requested that your care partner not be given the details of your procedure findings, then the procedure report has been included in a sealed envelope for you to review at your convenience later.  YOU SHOULD EXPECT: Some feelings of bloating in the abdomen. Passage of more gas than usual.  Walking can help get rid of the air that was put into your GI tract during the procedure and reduce the bloating. If you had a lower endoscopy (such as a colonoscopy or flexible sigmoidoscopy) you may notice spotting of blood in your stool or on the toilet paper. If you underwent a bowel prep for your procedure, you may not have a normal bowel movement for a few days.  Please Note:  You might notice some irritation and congestion in your nose or some drainage.  This is from the oxygen used during your procedure.  There is no need for concern and it should clear up in a day or so.  SYMPTOMS TO REPORT IMMEDIATELY:   Following lower endoscopy (colonoscopy or flexible sigmoidoscopy):  Excessive amounts of blood in the stool  Significant tenderness or worsening of abdominal pains  Swelling of the abdomen that is new, acute  Fever of 100F or higher    For urgent or emergent issues, a gastroenterologist can be reached at any hour by calling [phone].   DIET:  We do recommend a small meal at first, but then you may proceed to your regular diet.  Drink plenty of fluids but you should avoid alcoholic beverages for 24 hours.  ACTIVITY:  You should plan to take it easy for the rest of today and you should NOT DRIVE or use heavy machinery until tomorrow (because of the sedation medicines used during the  test).    FOLLOW UP: Our staff will call the number listed on your records the next business day following your procedure to check on you and address any questions or concerns that you may have regarding the information given to you following your procedure. If we do not reach you, we will leave a message.  However, if you are feeling well and you are not experiencing any problems, there is no need to return our call.  We will assume that you have returned to your regular daily activities without incident.  If any biopsies were taken you will be contacted by phone or by letter within the next 1-3 weeks.  Please call us at [phone] if you have not heard about the biopsies in 3 weeks.    SIGNATURES/CONFIDENTIALITY: You and/or your care partner have signed paperwork which will be entered into your electronic medical record.  These signatures attest to the fact that that the information above on your After Visit Summary has been reviewed and is understood.  Full responsibility of the confidentiality of this discharge information lies with you and/or your care-partner.   INFORMATION ON POLYPS AND HEMORRHOIDS GIVEN TO YOU TODAY  RESUME PREVIOUS DIET AND PREVIOUS MEDICATIONS  REPEAT COLONOSCOPY IN 3 YEARS

## 2017-07-04 NOTE — Op Note (Signed)
[place] Patient Name: Dustin Berry Procedure Date: 07/04/2017 11:32 AM MRN: [id number] Endoscopist: Ladene Artist , MD Age: 71 Referring MD:  Date of Birth: 07/15/1946 Gender: Male Account #: [id number] Procedure:                Colonoscopy Indications:              Surveillance: Personal history of adenomatous                            polyps on last colonoscopy > 5 years ago Medicines:                Monitored Anesthesia Care Procedure:                Pre-Anesthesia Assessment:                           - Prior to the procedure, a History and Physical                            was performed, and patient medications and                            allergies were reviewed. The patient's tolerance of                            previous anesthesia was also reviewed. The risks                            and benefits of the procedure and the sedation                            options and risks were discussed with the patient.                            All questions were answered, and informed consent                            was obtained. Prior Anticoagulants: The patient has                            taken no previous anticoagulant or antiplatelet                            agents. ASA Grade Assessment: II - A patient with                            mild systemic disease. After reviewing the risks                            and benefits, the patient was deemed in                            satisfactory condition to undergo the procedure.  After obtaining informed consent, the colonoscope                            was passed under direct vision. Throughout the                            procedure, the patient's blood pressure, pulse, and                            oxygen saturations were monitored continuously. The                            Colonoscope was introduced through the anus and                            advanced to the the cecum,  identified by                            appendiceal orifice and ileocecal valve. The                            ileocecal valve, appendiceal orifice, and rectum                            were photographed. The quality of the bowel                            preparation was excellent. The colonoscopy was                            performed without difficulty. The patient tolerated                            the procedure well. Scope In: 11:41:32 AM Scope Out: 11:53:35 AM Scope Withdrawal Time: 0 hours 10 minutes 41 seconds  Total Procedure Duration: 0 hours 12 minutes 3 seconds  Findings:                 The perianal and digital rectal examinations were                            normal.                           Six sessile polyps were found in the descending                            colon, transverse colon, hepatic flexure and                            ascending colon. The polyps were 5 to 8 mm in size.                            These polyps were removed with a cold snare.  Resection and retrieval were complete.                           Internal hemorrhoids were found during                            retroflexion. The hemorrhoids were medium-sized and                            Grade I (internal hemorrhoids that do not prolapse).                           The exam was otherwise without abnormality on                            direct and retroflexion views. Complications:            No immediate complications. Estimated blood loss:                            None. Estimated Blood Loss:     Estimated blood loss: none. Impression:               - Six 5 to 8 mm polyps in the descending colon, in                            the transverse colon, at the hepatic flexure and in                            the ascending colon, removed with a cold snare.                            Resected and retrieved.                           - Internal hemorrhoids.                            - The examination was otherwise normal on direct                            and retroflexion views. Recommendation:           - Repeat colonoscopy in 3 years for surveillance.                           - Patient has a contact number available for                            emergencies. The signs and symptoms of potential                            delayed complications were discussed with the                            patient. Return to normal activities tomorrow.  Written discharge instructions were provided to the                            patient.                           - Resume previous diet.                           - Continue present medications.                           - Await pathology results. Ladene Artist, MD 07/04/2017 11:55:45 AM This report has been signed electronically.

## 2017-07-04 NOTE — Progress Notes (Signed)
Called to room to assist during endoscopic procedure.  Patient ID and intended procedure confirmed with present staff. Received instructions for my participation in the procedure from the performing physician.Called to room to assist during endoscopic procedure.  Patient ID and intended procedure confirmed with present staff. Received instructions for my participation in the procedure from the performing physician. 

## 2017-07-04 NOTE — Progress Notes (Signed)
Spontaneous respirations throughout. VSS. Resting comfortably. To PACU on room air. Report to  Penny RN.  

## 2017-07-05 ENCOUNTER — Telehealth: Payer: Self-pay | Admitting: *Deleted

## 2017-07-05 NOTE — Telephone Encounter (Signed)
Message left

## 2017-07-05 NOTE — Telephone Encounter (Signed)
  Follow up Call-  Call back number 07/04/2017  Post procedure Call Back phone  # [phone]  Permission to leave phone message Yes  Some recent data might be hidden     Patient questions:  Do you have a fever, pain , or abdominal swelling? No. Pain Score  0 *  Have you tolerated food without any problems? Yes.    Have you been able to return to your normal activities? Yes.    Do you have any questions about your discharge instructions: Diet   No. Medications  No. Follow up visit  No.  Do you have questions or concerns about your Care? No.  Actions: * If pain score is 4 or above: No action needed, pain <4.

## 2017-07-16 ENCOUNTER — Encounter: Payer: Self-pay | Admitting: Gastroenterology

## 2018-01-29 ENCOUNTER — Encounter: Payer: Self-pay | Admitting: Cardiovascular Disease

## 2018-01-29 ENCOUNTER — Ambulatory Visit (INDEPENDENT_AMBULATORY_CARE_PROVIDER_SITE_OTHER): Payer: 59 | Admitting: Cardiovascular Disease

## 2018-01-29 VITALS — BP 120/78 | HR 70 | Ht 77.0 in | Wt 252.0 lb

## 2018-01-29 DIAGNOSIS — E785 Hyperlipidemia, unspecified: Secondary | ICD-10-CM

## 2018-01-29 DIAGNOSIS — I480 Paroxysmal atrial fibrillation: Secondary | ICD-10-CM

## 2018-01-29 DIAGNOSIS — I10 Essential (primary) hypertension: Secondary | ICD-10-CM

## 2018-01-29 DIAGNOSIS — Z79899 Other long term (current) drug therapy: Secondary | ICD-10-CM

## 2018-01-29 DIAGNOSIS — R002 Palpitations: Secondary | ICD-10-CM

## 2018-01-29 DIAGNOSIS — G4733 Obstructive sleep apnea (adult) (pediatric): Secondary | ICD-10-CM | POA: Diagnosis not present

## 2018-01-29 DIAGNOSIS — I341 Nonrheumatic mitral (valve) prolapse: Secondary | ICD-10-CM | POA: Diagnosis not present

## 2018-01-29 MED ORDER — METOPROLOL SUCCINATE ER 25 MG PO TB24: 25.0000 mg | ORAL_TABLET | Freq: Every day | ORAL | 3 refills | Status: DC

## 2018-01-29 NOTE — Progress Notes (Signed)
Patient ID: Dustin Berry., male   DOB: 1946/02/14, 72 y.o.   MRN: [id number]     HPI: Field Dustin Berry. is a 72 y.o. male who presents for a one year followup cardiology evaluation.     [name] has a long-standing history of paroxysmal atrial fibrillation. Initially in 2000 he was evaluated at [facility] and was treated at that time with flecainide. He subsequently developed breakthrough arrhythmia's and had done well with Rythmol SR and can, beta blocker. Remotely, he had seen Dr. Rosita Fire at [facility] as well as Dr. Glennon Mac at [facility] for consideration of atrial fibrillation ablation in 2011 at that time a decision not to have therapy. He had done well until recently but developed recurrent problems with recurrent atrial fibrillation despite medical therapy. He also has been diagnosed with obstructive sleep apnea and has been utilizing CPAP therapy with 100% compliance. He ultimately went back to [facility] and on June 6,2014 underwent pulmonary vein isolation of all pulmonary veins and radiofrequency ablation of CTI with bidirectional block noted with RA and CS pacing by Dr. Glennon Mac. He has been on eliquis 5 mg twice a day for anticoagulation. At that time he was taken off his multaq and metoprolol.  When I saw him in 2014 he started to notice white blood pressure elevation. He was unaware of any recurrent atrial fibrillation. He had been using his CPAP therapy. At that time, his blood pressure was 130/100. I elected to add low-dose lisinopril at 5 mg per day socially underwent a 2-D echo Doppler study on 11/21/2013. This showed an ejection fraction in the 45-50% range with grade 1 diastolic dysfunction. No definitive wall motion abnormalities were detected although the possibility was raised concerning possible mid inferior hypocontractility. He did have systolic bowing without definitive prolapse of his mitral valve with mild MR.    He underwent a one-year follow-up evaluation at [facility]  for his atrial fibrillation ablation.  He was maintaining sinus rhythm.  At that time, his anticoagulation was discontinued and he was resumed on baby aspirin for antiplatelet therapy.  He apparently has stopped using CPAP therapy over the past 3 years.  He denies any issues with his sleep.  He is unaware of any significant recurrent palpitations.  He has been monitoring his blood pressure at home and he states this is typically been running approximately 132 systolically.  He denies chest pain or palpitations.  He has back issues which has limited his exercise such that he predominantly just walks.  He no longer plays basketball.  Since I last saw him one year ago, he is is in the process of selling his house, and he is building a new house.  In the interim.  He tells me he has not been using his CPAP and that this is in a box preparing for his move.  He also has moved his [employer] center [place] of [place] the [place].  He is unaware of any recurrent atrial fibrillation.  Occasionally he admits to palpitations which he senses particularly when he lies on his side.  He underwent a follow-up echo Doppler study in February 2018 which showed an EF of 50-55% with grade 1 diastolic dysfunction.  His aortic root was mildly dilated and measured 4.5 cm.  There was mitral valve prolapse involving the anterior leaflet and posterior leaflet with mild MR.  There was mild biatrial enlargement.  In follow-up of his aortic root increase.  He underwent CT  angios of his chest and aorta which showed a 4.3 cm a sending thoracic aortic aneurysm on 03/02/2017.  He has not had recent laboratory.  He continues to be on lisinopril 5 mg for hypertension, atorvastatin 20 for hyperlipidemia.  He takes zolpidem as needed for sleep.  Past Medical History:  Diagnosis Date  . Abdominal pain 04/01/2013  . ALLERGIC RHINITIS 05/12/2008  . Arthritis   . Atrial fibrillation ([place]) 03/15/2010   14 day event monitor - 1 episode  of sinus bradycardia  . Atrial fibrillation with RVR ([place]) 10/21/2012   Echo- EF 50-55%; mild concentric LVH; flow pattern suggestive of impaired LV relaxation; mild mitral valve prolapse, trace mitral regurgitation  . Back pain    receiving PT  . Boil of buttock 06/24/11  . BPH (benign prostatic hypertrophy) 09/05/2012  . Cataract   . Chest pain with exertion presumed to be tachycardia related along with SOB 04/01/2013  . ESOPHAGEAL STRICTURE 03/24/2009  . Heart murmur   . Hematuria, possible 04/01/2013  . HTN (hypertension) 12/04/2013  . HYPERLIPIDEMIA 08/18/2007  . Insomnia 06/08/2016  . INSOMNIA-SLEEP DISORDER-UNSPEC 05/14/2009  . Mitral valve prolapse 07/04/2011  . MRSA (methicillin resistant staph aureus) culture positive 5+ years ago  . Neuromuscular disorder ([place])    peripheral neuropathy  . OSA (obstructive sleep apnea) 07/04/2011   sleep study - average AHI 2.5  . Palpitations 03/06/2007   R/P MV - mild perfusion defect in basal inferoseptal, basal inferior, mid inferoseptal, and mid inferior regions, consistent w/ infarct/scar; no scintigraphic evidence of inducible myocardial ischemia; prior non transmural infarct cannot be completely excluded; EF 48%; no significant change from previous study  . PERIPHERAL NEUROPATHY 05/12/2008  . Personal history of colonic polyps 05/12/2008  . Preventative health care 06/24/2011  . Sleep apnea   . 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 CARDIOVERSION N/A 04/03/2013   Procedure: CARDIOVERSION;  Surgeon: Pixie Casino, MD;  Location: [facility] ENDOSCOPY;  Service: Cardiovascular;  Laterality: N/A;  . COLONOSCOPY    . HAND SURGERY     Thumb joint repair  . POLYPECTOMY    . TEE WITHOUT CARDIOVERSION N/A 04/03/2013   Procedure: TRANSESOPHAGEAL ECHOCARDIOGRAM (TEE);  Surgeon: Pixie Casino, MD;  Location: [facility] ENDOSCOPY;  Service: Cardiovascular;  Laterality: N/A;  . TONSILLECTOMY AND ADENOIDECTOMY    .  UPPER GASTROINTESTINAL ENDOSCOPY     dilation    No Known Allergies  Current Outpatient Medications  Medication Sig Dispense Refill  . aspirin EC 81 MG EC tablet Take 1 tablet (81 mg total) by mouth daily.    Marland Kitchen atorvastatin (LIPITOR) 20 MG tablet Take 1 tablet (20 mg total) by mouth daily. 90 tablet 3  . lisinopril (PRINIVIL,ZESTRIL) 5 MG tablet Take 1 tablet (5 mg total) by mouth daily. 90 tablet 3  . OVER THE COUNTER MEDICATION Magnesium Citrate tablets 200 mg. Daily.    Marland Kitchen zolpidem (AMBIEN) 5 MG tablet Take 1 tablet (5 mg total) by mouth at bedtime as needed for sleep. 90 tablet 1  . metoprolol succinate (TOPROL XL) 25 MG 24 hr tablet Take 1 tablet (25 mg total) by mouth daily. 90 tablet 3   Current Facility-Administered Medications  Medication Dose Route Frequency Provider Last Rate Last Dose  . 0.9 %  sodium chloride infusion  500 mL Intravenous Continuous Ladene Artist, MD        Social History   Socioeconomic History  . Marital status: Married  Spouse name: Not on file  . Number of children: Not on file  . Years of education: Not on file  . Highest education level: Not on file  Social Needs  . Financial resource strain: Not on file  . Food insecurity - worry: Not on file  . Food insecurity - inability: Not on file  . Transportation needs - medical: Not on file  . Transportation needs - non-medical: Not on file  Occupational History  . 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 Smoking status: Never Smoker  . Smokeless tobacco: Never Used  Substance and Sexual Activity  . Alcohol use: Yes    Alcohol/week: 0.6 oz    Types: 1 Glasses of wine per week  . Drug use: No  . Sexual activity: Not on file  Other Topics Concern  . Not on file  Social History Narrative  . Not on file    Family History  Problem Relation Age of Onset  . Heart disease Father 65       died with MI  . Cancer Sister        Melanoma  .  Dementia Mother   . Stroke Maternal Grandfather   . Breast cancer Maternal Grandmother   . Colon cancer Neg Hx   . Esophageal cancer Neg Hx   . Stomach cancer Neg Hx   . Rectal cancer Neg Hx    Social history is notable in that he is married has 4 children. He never smoked cigarettes. He does try to exercise. Previously had played basketball. 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    ROS General: Negative; No fevers, chills, or night sweats;  HEENT: Negative; No changes in vision or hearing, sinus congestion, difficulty swallowing Pulmonary: Negative; No cough, wheezing, shortness of breath, hemoptysis Cardiovascular: See history of present illness GI: Negative; No nausea, vomiting, diarrhea, or abdominal pain GU: Negative; No dysuria, hematuria, or difficulty voiding Musculoskeletal: Negative; no myalgias, joint pain, or weakness Hematologic/Oncology: Negative; no easy bruising, bleeding Endocrine: Negative; no heat/cold intolerance; no diabetes Neuro: Negative; no changes in balance, headaches Skin: Negative; No rashes or skin lesions Psychiatric: Negative; No behavioral problems, depression Sleep: Positive for obstructive sleep apnea.  He plans to reinstitute therapy and had been on CPAP therapy in the past.  No snoring, daytime sleepiness, hypersomnolence, bruxism, restless legs, hypnogognic hallucinations, no cataplexy Other comprehensive 14 point system review is negative.   PE BP 120/78   Pulse 70   Ht _0  (1.956 m)   Wt 252 lb (114.3 kg)   BMI 29.88 kg/m    Repeat blood pressure by me was 130/78  Wt Readings from Last 3 Encounters:  01/29/18 252 lb (114.3 kg)  07/04/17 237 lb (107.5 kg)  06/20/17 237 lb 3.2 oz (107.6 kg)   General: Alert, oriented, no distress.  Skin: normal turgor, no rashes, warm and dry HEENT: Normocephalic, atraumatic. Pupils equal round and reactive to light; sclera anicteric; extraocular muscles intact;   Nose without nasal septal hypertrophy Mouth/Parynx benign; Mallinpatti scale 3 Neck: No JVD, no carotid bruits; normal carotid upstroke Lungs: clear to ausculatation and percussion; no wheezing or rales Chest wall: without tenderness to palpitation Heart: PMI not displaced, RRR, s1 s2 normal, 1/6 systolic murmur, no audible click no diastolic murmur, no rubs, gallops, thrills, or heaves Abdomen: soft, nontender; no hepatosplenomehaly, BS+; abdominal aorta nontender and not dilated by palpation. Back: no CVA tenderness  Pulses 2+ Musculoskeletal: full range of motion, normal strength, no joint deformities Extremities: no clubbing cyanosis or edema, Homan's sign negative  Neurologic: grossly nonfocal; Cranial nerves grossly wnl Psychologic: Normal mood and affect   ECG (independently read by me): Normal sinus rhythm at 70 bpm.  Isolated PVC.  Nonspecific ST changes.  QTc interval 425 ms.  January 2018 ECG (independently read by me): Normal sinus rhythm at 73 bpm.  PR interval 170 ms, QTc interval 423 ms.  Nonspecific ST-T changes.  December 2014 ECG: Normal sinus rhythm at 66 beats per minute. Nonspecific ST-T changes. QTc interval 429 ms.  LABS:  BMP Latest Ref Rng & Units 03/01/2017 01/12/2017 06/01/2016  Glucose 65 - 99 mg/dL 93 94 100(H)  BUN 7 - 25 mg/dL _0 Creatinine 0.70 - 1.25 mg/dL 1.13 1.09 1.03  Sodium 135 - 146 mmol/L 141 141 141  Potassium 3.5 - 5.3 mmol/L 4.6 4.7 4.6  Chloride 98 - 110 mmol/L 111(H) 109 108  CO2 20 - 31 mmol/L _1 Calcium 8.6 - 10.3 mg/dL 9.3 9.1 9.5   Hepatic Function Latest Ref Rng & Units 01/12/2017 06/01/2016 05/29/2015  Total Protein 6.1 - 8.1 g/dL 6.9 6.9 6.6  Albumin 3.6 - 5.1 g/dL 4.0 4.3 3.9  AST 10 - 35 U/L _2 ALT 9 - 46 U/L _3 Alk Phosphatase 40 - 115 U/L 59 57 60  Total Bilirubin 0.2 - 1.2 mg/dL 0.5 0.8 0.5  Bilirubin, Direct 0.0 - 0.3 mg/dL - 0.1 0.1   CBC Latest Ref Rng & Units 01/12/2017 06/01/2016 05/29/2015  WBC  3.8 - 10.8 K/uL 4.4 4.9 4.7  Hemoglobin 13.2 - 17.1 g/dL 15.5 15.5 15.4  Hematocrit 38.5 - 50.0 % 46.5 46.2 45.4  Platelets 140 - 400 K/uL 203 270.0 263.0   Lab Results  Component Value Date   MCV 88.9 01/12/2017   MCV 88.4 06/01/2016   MCV 89.0 05/29/2015   Lab Results  Component Value Date   TSH 2.72 01/12/2017   Lipid Panel     Component Value Date/Time   CHOL 135 01/12/2017 0916   TRIG 54 01/12/2017 0916   HDL 46 01/12/2017 0916   CHOLHDL 2.9 01/12/2017 0916   VLDL 11 01/12/2017 0916   LDLCALC 78 01/12/2017 0916    RADIOLOGY: No results found.  IMPRESSION:  1. Mitral valve prolapse   2. PAF (paroxysmal atrial fibrillation) (HCC)   3. Palpitations   4. Essential hypertension   5. Hyperlipidemia, unspecified hyperlipidemia type   6. OSA (obstructive sleep apnea)   7. Medication management     ASSESSMENT AND PLAN: Mr. Stegman is a 72 year-old Caucasian male who is a history of paroxysmal atrial fibrillation dating back to 2000 when he was initiated on therapy at [facility].  He underwent successful radiofrequency AF ablation with pulmonary vein isolation in June 2014 by Dr. Norm Salt at [facility]. He is maintaining sinus rhythm.  He had been on eliquis anticoagulation and this was discontinued by his [place] and now he is just on aspirin 81 mg.  Has a history of mild hypertension and has been maintained on lisinopril 5 mg.  His echo Doppler study in several 2014 showed an ejection fraction of 45-50%.  I reviewed with him in detail.  The last echo of February 2018 which showed an EF of 11-57%, grade 1 diastolic dysfunction, mitral valve prolapse involving both leaflets with mild MR, mild biatrial enlargement,  and mild dilation of his a sending aorta at 4.5 cm.  I also reviewed his CT angios of his chest, which showed a 4.3 cm descending thoracic aortic aneurysm.  He is remains asymptomatic without chest pain.  He notes an occasional episode of palpitation  which seem to be isolated, but at times when he lies on his side.  He may experience several beats more frequently.  I have again recommended reinstitution of CPAP therapy for his obstructive sleep apnea.  I discussed with him the increased potential risk for recurrent atrial fibrillation.  If his sleep apnea is untreated.  He will also need to be set up with a new DME company and I will [facility] to [facility].  With his occasional palpitations, I have suggested initiation of low-dose Toprol-XL 25 mg.  Also be helpful to reduce potential wall stress with his aortic aneurysm.  Fasting laboratory will be obtained.  In 6 months, he will undergo an 18 month follow-up echo Doppler study, and I will see him in the office for follow-up evaluation.    Time spent: 25 minutes  Troy Sine, MD, [facility]  01/29/2018 7:20 PM

## 2018-01-29 NOTE — Patient Instructions (Signed)
Medication Instructions:  START metoprolol succinate (Toprol XL) 25 mg daily  Labwork: Please return for FASTING labs (CMET, CBC, Lipid, TSH)  Our in office lab hours are Monday-Friday 8:00-4:30, closed for lunch 12:45-1:45 pm.  No appointment needed.   Testing/Procedures: Your physician has requested that you have an echocardiogram in 6 MONTHS. Echocardiography is a painless test that uses sound waves to create images of your heart. It provides your doctor with information about the size and shape of your heart and how well your heart's chambers and valves are working. This procedure takes approximately one hour. There are no restrictions for this procedure.  This will be done at our [facility] location:  [place] wants you to follow-up in: 6 months with Dr. Claiborne Billings. You will receive a reminder letter in the mail two months in advance. If you don't receive a letter, please call our office to schedule the follow-up appointment.   Any Other Special Instructions Will Be Listed Below (If Applicable).     If you need a refill on your cardiac medications before your next appointment, please call your pharmacy.

## 2018-01-30 ENCOUNTER — Telehealth: Payer: Self-pay | Admitting: *Deleted

## 2018-01-30 ENCOUNTER — Other Ambulatory Visit: Payer: Self-pay | Admitting: Cardiovascular Disease

## 2018-01-30 DIAGNOSIS — G4733 Obstructive sleep apnea (adult) (pediatric): Secondary | ICD-10-CM

## 2018-01-30 DIAGNOSIS — I4891 Unspecified atrial fibrillation: Secondary | ICD-10-CM

## 2018-01-30 DIAGNOSIS — I10 Essential (primary) hypertension: Secondary | ICD-10-CM

## 2018-01-30 NOTE — Telephone Encounter (Signed)
Phone call to patient to ask how long it has been since he has used his CPAP machine. He informed me that he has not used it in 5 years. I informed him that since it has been such a long time his insurance is going to require him to have another sleep study. Patient agrees. Split night study will be ordered.

## 2018-02-02 ENCOUNTER — Telehealth: Payer: Self-pay | Admitting: *Deleted

## 2018-02-02 LAB — COMPREHENSIVE METABOLIC PANEL
ALBUMIN: 4.3 g/dL (ref 3.5–4.8)
ALT: 22 IU/L (ref 0–44)
AST: 19 IU/L (ref 0–40)
Albumin/Globulin Ratio: 1.7 (ref 1.2–2.2)
Alkaline Phosphatase: 63 IU/L (ref 39–117)
BILIRUBIN TOTAL: 0.4 mg/dL (ref 0.0–1.2)
BUN / CREAT RATIO: 16 (ref 10–24)
BUN: 17 mg/dL (ref 8–27)
CHLORIDE: 108 mmol/L — AB (ref 96–106)
CO2: 23 mmol/L (ref 20–29)
Calcium: 9.2 mg/dL (ref 8.6–10.2)
Creatinine, Ser: 1.08 mg/dL (ref 0.76–1.27)
GFR, EST AFRICAN AMERICAN: 80 mL/min/{1.73_m2} (ref >59)
GFR, EST NON AFRICAN AMERICAN: 69 mL/min/{1.73_m2} (ref >59)
GLUCOSE: 104 mg/dL — AB (ref 65–99)
Globulin, Total: 2.6 g/dL (ref 1.5–4.5)
Potassium: 5 mmol/L (ref 3.5–5.2)
Sodium: 143 mmol/L (ref 134–144)
TOTAL PROTEIN: 6.9 g/dL (ref 6.0–8.5)

## 2018-02-02 LAB — CBC
Hematocrit: 45.2 % (ref 37.5–51.0)
Hemoglobin: 15.4 g/dL (ref 13.0–17.7)
MCH: 30.3 pg (ref 26.6–33.0)
MCHC: 34.1 g/dL (ref 31.5–35.7)
MCV: 89 fL (ref 79–97)
Platelets: 227 10*3/uL (ref 150–379)
RBC: 5.09 x10E6/uL (ref 4.14–5.80)
RDW: 14.4 % (ref 12.3–15.4)
WBC: 3.8 10*3/uL (ref 3.4–10.8)

## 2018-02-02 LAB — LIPID PANEL
CHOL/HDL RATIO: 4.5 ratio (ref 0.0–5.0)
Cholesterol, Total: 170 mg/dL (ref 100–199)
HDL: 38 mg/dL — ABNORMAL LOW (ref >39)
LDL Calculated: 115 mg/dL — ABNORMAL HIGH (ref 0–99)
Triglycerides: 83 mg/dL (ref 0–149)
VLDL Cholesterol Cal: 17 mg/dL (ref 5–40)

## 2018-02-02 LAB — TSH: TSH: 2.75 u[IU]/mL (ref 0.450–4.500)

## 2018-02-02 NOTE — Telephone Encounter (Signed)
Patient notified of sleep study appointment scheduled @[facility] on 02/19/18. Contact information given to him if he needs to reschedule.

## 2018-02-19 ENCOUNTER — Ambulatory Visit (HOSPITAL_BASED_OUTPATIENT_CLINIC_OR_DEPARTMENT_OTHER): Payer: 59 | Attending: Cardiovascular Disease | Admitting: Cardiovascular Disease

## 2018-02-19 VITALS — Ht 77.0 in | Wt 252.0 lb

## 2018-02-19 DIAGNOSIS — R0683 Snoring: Secondary | ICD-10-CM | POA: Diagnosis not present

## 2018-02-19 DIAGNOSIS — G4736 Sleep related hypoventilation in conditions classified elsewhere: Secondary | ICD-10-CM | POA: Diagnosis not present

## 2018-02-19 DIAGNOSIS — G4730 Sleep apnea, unspecified: Secondary | ICD-10-CM | POA: Diagnosis not present

## 2018-02-19 DIAGNOSIS — Z7982 Long term (current) use of aspirin: Secondary | ICD-10-CM | POA: Insufficient documentation

## 2018-02-19 DIAGNOSIS — G478 Other sleep disorders: Secondary | ICD-10-CM

## 2018-02-19 DIAGNOSIS — Z79899 Other long term (current) drug therapy: Secondary | ICD-10-CM | POA: Diagnosis not present

## 2018-02-19 DIAGNOSIS — I10 Essential (primary) hypertension: Secondary | ICD-10-CM | POA: Diagnosis not present

## 2018-02-19 DIAGNOSIS — G4733 Obstructive sleep apnea (adult) (pediatric): Secondary | ICD-10-CM | POA: Diagnosis not present

## 2018-02-19 DIAGNOSIS — I493 Ventricular premature depolarization: Secondary | ICD-10-CM | POA: Diagnosis not present

## 2018-02-19 DIAGNOSIS — I4891 Unspecified atrial fibrillation: Secondary | ICD-10-CM

## 2018-03-06 ENCOUNTER — Encounter (HOSPITAL_BASED_OUTPATIENT_CLINIC_OR_DEPARTMENT_OTHER): Payer: Self-pay | Admitting: Cardiovascular Disease

## 2018-03-06 NOTE — Procedures (Signed)
Patient Name: Dustin Berry, Dustin Berry Date: 02/19/2018 Gender: Male D.O.B: 1946/01/11 Age (years): 31 Referring Provider: Shelva Majestic MD, ABSM Height (inches): 77 Interpreting Physician: Shelva Majestic MD, ABSM Weight (lbs): 235 RPSGT: Jonna Coup BMI: 28 MRN: [id number] Neck Size: 16.50  CLINICAL INFORMATION Sleep Study Type: NPSG  Indication for sleep study: Hypertension, OSA  Epworth Sleepiness Score: 5  SLEEP STUDY TECHNIQUE As per the AASM Manual for the Scoring of Sleep and Associated Events v2.3 (April 2016) with a hypopnea requiring 4% desaturations.  The channels recorded and monitored were frontal, central and occipital EEG, electrooculogram (EOG), submentalis EMG (chin), nasal and oral airflow, thoracic and abdominal wall motion, anterior tibialis EMG, snore microphone, electrocardiogram, and pulse oximetry.  MEDICATIONS     aspirin EC 81 MG EC tablet         atorvastatin (LIPITOR) 20 MG tablet         lisinopril (PRINIVIL,ZESTRIL) 5 MG tablet         metoprolol succinate (TOPROL XL) 25 MG 24 hr tablet         OVER THE COUNTER MEDICATION         zolpidem (AMBIEN) 5 MG tablet      Medications self-administered by patient taken the night of the study : N/A  SLEEP ARCHITECTURE The study was initiated at 10:31:34 PM and ended at 4:36:21 AM.  Sleep onset time was 16.1 minutes and the sleep efficiency was 81.6%%. The total sleep time was 297.5 minutes.  Stage REM latency was 70.5 minutes.  The patient spent 3.2%% of the night in stage N1 sleep, 74.1%% in stage N2 sleep, 0.0%% in stage N3 and 22.69% in REM.  Alpha intrusion was absent.  Supine sleep was 22.15%.  RESPIRATORY PARAMETERS The overall apnea/hypopnea index (AHI) was 2.6 per hour. the respiratory disturbance index (RDI) was 3.6 per hour. There were 12 total apneas, including 11 obstructive, 1 central and 0 mixed apneas. There were 1 hypopneas and 5 RERAs.  The AHI during Stage REM sleep was 4.4  per hour.  AHI while supine was 10.9 per hour.  The mean oxygen saturation was 94.1%. The minimum SpO2 during sleep was 91.0%.  Soft snoring was noted during this study.  CARDIAC DATA The 2 lead EKG demonstrated sinus rhythm. The mean heart rate was 58.9 beats per minute. Other EKG findings include: PVCs.  LEG MOVEMENT DATA The total PLMS were 0 with a resulting PLMS index of 0.0. Associated arousal with leg movement index was 3.2 .  IMPRESSIONS - Increased upper airway resistance syndrome (UARS) without significant sleep apnea overall.  However, the patient had mild sleep apnea with supine position with an AHI of 10.9 per hour. - No significant central sleep apnea occurred during this study (CAI = 0.2/h). - The patient had minimal or no oxygen desaturation during the study (Min O2 = 91.0%) - The patient snored with soft snoring volume. - EKG findings include PVCs. - Clinically significant periodic limb movements did not occur during sleep. No significant associated arousals.  DIAGNOSIS - Increased upper resistance syndrome (UARS);  sleep apnea, unspecified type G47.30 - Nocturnal Hypoxemia (327.26 [G47.36 ICD-10])  RECOMMENDATIONS - At present, there is no indication for CPAP therapy. - Efforts should be made to optimize nasal and oral pharyngeal patency - With mild sleep apnea with supine posture, consider positional therapy avoiding supine position during sleep. - Avoid alcohol, sedatives and other CNS depressants that may worsen sleep apnea and disrupt normal sleep architecture. -  Sleep hygiene should be reviewed to assess factors that may improve sleep quality. - Weight management and regular exercise should be initiated or continued if appropriate.  [Electronically signed] 03/06/2018 11:39 AM  Shelva Majestic MD, [facility], [place], American Board of Sleep Medicine   NPI: [id number] [place] PH: [phone]   FX: [phone] [place]

## 2018-03-07 ENCOUNTER — Encounter: Payer: Self-pay | Admitting: Internal Medicine

## 2018-03-07 ENCOUNTER — Ambulatory Visit (INDEPENDENT_AMBULATORY_CARE_PROVIDER_SITE_OTHER): Payer: 59 | Admitting: Internal Medicine

## 2018-03-07 VITALS — BP 124/88 | HR 82 | Temp 98.4°F | Resp 16 | Wt 246.0 lb

## 2018-03-07 DIAGNOSIS — H6993 Unspecified Eustachian tube disorder, bilateral: Secondary | ICD-10-CM

## 2018-03-07 DIAGNOSIS — J209 Acute bronchitis, unspecified: Secondary | ICD-10-CM

## 2018-03-07 DIAGNOSIS — H6983 Other specified disorders of Eustachian tube, bilateral: Secondary | ICD-10-CM

## 2018-03-07 HISTORY — DX: Acute bronchitis, unspecified: J20.9

## 2018-03-07 HISTORY — DX: Unspecified eustachian tube disorder, bilateral: H69.93

## 2018-03-07 MED ORDER — CEFDINIR 300 MG PO CAPS: 300.0000 mg | ORAL_CAPSULE | Freq: Two times a day (BID) | ORAL | 0 refills | Status: DC

## 2018-03-07 MED ORDER — PREDNISONE 20 MG PO TABS: 40.0000 mg | ORAL_TABLET | Freq: Every day | ORAL | 0 refills | Status: DC

## 2018-03-07 NOTE — Patient Instructions (Signed)
Take the antibiotic as prescribed.  Take the prednisone daily for 5 days - take with food.    Continue over the counter cold medications.  Rest, fluids.     Call if no improvement

## 2018-03-07 NOTE — Assessment & Plan Note (Signed)
Likely bacterial  Start Omnicef twice daily times 10 days Continue NyQuil, can try Mucinex Discontinue Sudafed-likely causing PVCs Rest, fluid Call if no improvement

## 2018-03-07 NOTE — Assessment & Plan Note (Signed)
Your exam is normal-no infection or excessive cerumen No obvious sinus infection His hearing has gotten worse and he is  experiencing increased pressure Prednisone 40 mg daily for 5 days Can try Flonase nasal spray

## 2018-03-07 NOTE — Progress Notes (Signed)
Subjective:    Patient ID: Dustin Berry., male    DOB: 06/04/1946, 72 y.o.   MRN: [id number]  HPI The patient is here for an acute visit.  He is here for an acute visit for cold symptoms.  His symptoms started 10 days ago.    He is experiencing his tinitus is louder, worsening of hearing.  His ears do feel plugged.  He states chills, decreased appetite, mild nosebleeds for the past day, sore throat, cough that is starting to become more productive of gray sputum and lightheadedness.  He denies any fever, no significant nasal congestion, ear pain, sinus pressure, shortness of breath and wheezing.  He denies any nausea, diarrhea or headaches.  He does feel dehydrated.  He has tried taking 4 days of sudafed.  nyquil at  night  His heart rate has been more irregular - maybe some PVC's.  He had Afib and had an ablation.  He can get PVCs with caffeiene.   Medications and allergies reviewed with patient and updated if appropriate.  Patient Active Problem List   Diagnosis Date Noted  . 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 Insomnia 06/08/2016  . Cellulitis of knee, left 04/12/2016  . PAF (paroxysmal atrial fibrillation) ([place]) 12/04/2013  . HTN (hypertension) 12/04/2013  . Bradycardia 04/17/2013  . Atrial fibrillation with RVR ([place]) 04/01/2013  . Chest pain with exertion presumed to be tachycardia related along with SOB 04/01/2013  . Hematuria 04/01/2013  . BPH (benign prostatic hypertrophy) 09/05/2012  . Tinnitus 09/05/2012  . OSA (obstructive sleep apnea) 07/04/2011  . Mitral valve prolapse 07/04/2011  . Fatigue 07/04/2011  . Preventative health care 06/24/2011  . INSOMNIA-SLEEP DISORDER-UNSPEC 05/14/2009  . ESOPHAGEAL STRICTURE 03/24/2009  . GERD 03/24/2009  . PERIPHERAL NEUROPATHY 05/12/2008  . ALLERGIC RHINITIS 05/12/2008  . Personal history of colonic polyps 05/12/2008  . HYPERLIPIDEMIA 08/18/2007  . FIBRILLATION, ATRIAL 08/18/2007    Current Outpatient  Medications on File Prior to Visit  Medication Sig Dispense Refill  . aspirin EC 81 MG EC tablet Take 1 tablet (81 mg total) by mouth daily.    Marland Kitchen atorvastatin (LIPITOR) 20 MG tablet Take 1 tablet (20 mg total) by mouth daily. 90 tablet 3  . lisinopril (PRINIVIL,ZESTRIL) 5 MG tablet Take 1 tablet (5 mg total) by mouth daily. 90 tablet 3  . metoprolol succinate (TOPROL XL) 25 MG 24 hr tablet Take 1 tablet (25 mg total) by mouth daily. 90 tablet 3  . OVER THE COUNTER MEDICATION Magnesium Citrate tablets 200 mg. Daily.    Marland Kitchen zolpidem (AMBIEN) 5 MG tablet Take 1 tablet (5 mg total) by mouth at bedtime as needed for sleep. 90 tablet 1   Current Facility-Administered Medications on File Prior to Visit  Medication Dose Route Frequency Provider Last Rate Last Dose  . 0.9 %  sodium chloride infusion  500 mL Intravenous Continuous Ladene Artist, MD        Past Medical History:  Diagnosis Date  . Abdominal pain 04/01/2013  . ALLERGIC RHINITIS 05/12/2008  . Arthritis   . Atrial fibrillation ([place]) 03/15/2010   14 day event monitor - 1 episode of sinus bradycardia  . Atrial fibrillation with RVR ([place]) 10/21/2012   Echo- EF 50-55%; mild concentric LVH; flow pattern suggestive of impaired LV relaxation; mild mitral valve prolapse, trace mitral regurgitation  . Back pain    receiving PT  . Boil of buttock 06/24/11  . BPH (benign prostatic hypertrophy) 09/05/2012  . Cataract   .  Chest pain with exertion presumed to be tachycardia related along with SOB 04/01/2013  . ESOPHAGEAL STRICTURE 03/24/2009  . Heart murmur   . Hematuria, possible 04/01/2013  . HTN (hypertension) 12/04/2013  . HYPERLIPIDEMIA 08/18/2007  . Insomnia 06/08/2016  . INSOMNIA-SLEEP DISORDER-UNSPEC 05/14/2009  . Mitral valve prolapse 07/04/2011  . MRSA (methicillin resistant staph aureus) culture positive 5+ years ago  . Neuromuscular disorder ([place])    peripheral neuropathy  . OSA (obstructive sleep apnea) 07/04/2011   sleep study - average  AHI 2.5  . Palpitations 03/06/2007   R/P MV - mild perfusion defect in basal inferoseptal, basal inferior, mid inferoseptal, and mid inferior regions, consistent w/ infarct/scar; no scintigraphic evidence of inducible myocardial ischemia; prior non transmural infarct cannot be completely excluded; EF 48%; no significant change from previous study  . PERIPHERAL NEUROPATHY 05/12/2008  . Personal history of colonic polyps 05/12/2008  . Preventative health care 06/24/2011  . Sleep apnea   . 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 CARDIOVERSION N/A 04/03/2013   Procedure: CARDIOVERSION;  Surgeon: Pixie Casino, MD;  Location: [facility] ENDOSCOPY;  Service: Cardiovascular;  Laterality: N/A;  . COLONOSCOPY    . HAND SURGERY     Thumb joint repair  . POLYPECTOMY    . TEE WITHOUT CARDIOVERSION N/A 04/03/2013   Procedure: TRANSESOPHAGEAL ECHOCARDIOGRAM (TEE);  Surgeon: Pixie Casino, MD;  Location: [facility] ENDOSCOPY;  Service: Cardiovascular;  Laterality: N/A;  . TONSILLECTOMY AND ADENOIDECTOMY    . UPPER GASTROINTESTINAL ENDOSCOPY     dilation    Social History   Socioeconomic History  . Marital status: Married    Spouse name: Not on file  . Number of children: Not on file  . Years of education: Not on file  . Highest education level: Not on file  Occupational History  . 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 Financial resource strain: Not on file  . Food insecurity:    Worry: Not on file    Inability: Not on file  . Transportation needs:    Medical: Not on file    Non-medical: Not on file  Tobacco Use  . Smoking status: Never Smoker  . Smokeless tobacco: Never Used  Substance and Sexual Activity  . Alcohol use: Yes    Alcohol/week: 0.6 oz    Types: 1 Glasses of wine per week  . Drug use: No  . Sexual activity: Not on file  Lifestyle  . Physical activity:    Days per week: Not on file      Minutes per session: Not on file  . Stress: Not on file  Relationships  . Social connections:    Talks on phone: Not on file    Gets together: Not on file    Attends religious service: Not on file    Active member of club or organization: Not on file    Attends meetings of clubs or organizations: Not on file    Relationship status: Not on file  Other Topics Concern  . Not on file  Social History Narrative  . Not on file    Family History  Problem Relation Age of Onset  . Heart disease Father 90       died with MI  . Cancer Sister        Melanoma  . Dementia Mother   . Stroke Maternal Grandfather   . Breast cancer Maternal Grandmother   .  Colon cancer Neg Hx   . Esophageal cancer Neg Hx   . Stomach cancer Neg Hx   . Rectal cancer Neg Hx     Review of Systems  Constitutional: Positive for appetite change (decreased) and chills. Negative for fever.  HENT: Positive for hearing loss, nosebleeds (x 1 day), sore throat and tinnitus (worse than usual). Negative for congestion, ear pain, sinus pressure and sinus pain.        Jaw bone sore this morning  Respiratory: Positive for cough (dry, some sputum at times - gray). Negative for chest tightness, shortness of breath and wheezing.   Cardiovascular: Positive for palpitations (recently).  Gastrointestinal: Negative for diarrhea and nausea.  Musculoskeletal: Negative for myalgias.  Neurological: Positive for light-headedness. Negative for dizziness and headaches.       Objective:   Vitals:   03/07/18 1101  BP: 124/88  Pulse: 82  Resp: 16  Temp: 98.4 F (36.9 C)  SpO2: 97%   BP Readings from Last 3 Encounters:  03/07/18 124/88  01/29/18 120/78  07/04/17 106/68   Wt Readings from Last 3 Encounters:  03/07/18 246 lb (111.6 kg)  02/19/18 252 lb (114.3 kg)  01/29/18 252 lb (114.3 kg)   Body mass index is 29.17 kg/m.   Physical Exam    GENERAL APPEARANCE: Appears stated age, well appearing, NAD EYES:  conjunctiva clear, no icterus HEENT: bilateral tympanic membranes and ear canals normal, oropharynx with mild erythema, no thyromegaly, trachea midline, no cervical or supraclavicular lymphadenopathy LUNGS: Clear to auscultation without wheeze or crackles, unlabored breathing, good air entry bilaterally CARDIOVASCULAR: Normal S1,S2 without murmurs, no edema SKIN: Warm, dry      Assessment & Plan:    See Problem List for Assessment and Plan of chronic medical problems.

## 2018-03-14 ENCOUNTER — Telehealth: Payer: Self-pay | Admitting: *Deleted

## 2018-03-14 NOTE — Telephone Encounter (Signed)
-----   Message from Troy Sine, MD sent at 03/06/2018 11:44 AM EDT ----- ,Mariann Laster, please notify the patient the results of the sleep study.  At present, no indication for CPAP therapy.

## 2018-03-14 NOTE — Telephone Encounter (Signed)
Patient notified of sleep study results. 

## 2018-03-15 ENCOUNTER — Encounter: Payer: Self-pay | Admitting: *Deleted

## 2018-03-21 ENCOUNTER — Telehealth: Payer: Self-pay | Admitting: *Deleted

## 2018-03-26 NOTE — Telephone Encounter (Signed)
Spoke with patient to discuss sleep study results and recommendations.

## 2018-04-05 ENCOUNTER — Ambulatory Visit: Payer: Self-pay | Admitting: *Deleted

## 2018-04-05 NOTE — Telephone Encounter (Signed)
Patient is calling and states he had a cough over the weekend so he went to a clinic on Saturday 03/28/18 and states he was prescribed Amoxicillin and Benzonatate pearls. He states he now has a rash all over his body and read online that is a side effect from Benzonatate. Patient would like to know what can help with this rash or if something needs to be called in. Patient states it does itch from time to time. Please advise and contact pt.

## 2018-04-05 NOTE — Telephone Encounter (Signed)
Call returned to pt. Reported onset of generalized small fine red rash yesterday.  Reported the rash is on face, ears, chest back, arms, groin and legs.  Reported that his existing skin moles are slightly raised, from the rash.  Denied any lip, tongue, or throat swelling.  Reported he read that the [place], which he started on Saturday, can cause a rash and headache.  Stated the rash is dry at this time.  Reported he has "mild" itching; rated the need to itch at about 5/10.  Stated he had shortness of breath and cough, that he was evaluated at another clinic on Saturday; now cough and SOB has improved.  Pt. stopped the Benzonatate yesterday, when he discovered the rash. Reported had a headache, that has resolved since stopping the Benzonatate.   Pt. denied any dizziness, sore throat, or joint pain.  Appt. Scheduled for 04/06/18 with PCP.  Care advice given per protocol.  Verb. Understanding. Agrees w/ plan.          Reason for Disposition . Mild widespread rash  Answer Assessment - Initial Assessment Questions 1. APPEARANCE of RASH: "Describe the rash." (e.g., spots, blisters, raised areas, skin peeling, scaly)     Fine red rash; noted existing skin moles raised 2. SIZE: "How big are the spots?" (e.g., tip of pen, eraser, coin; inches, centimeters)     very small spots, able to feel with touching  3. LOCATION: "Where is the rash located?"     Face,chest, back, arms, legs, ears, groin  4. COLOR: "What color is the rash?" (Note: It is difficult to assess rash color in people with darker-colored skin. When this situation occurs, simply ask the caller to describe what they see.)     Rash 5. ONSET: "When did the rash begin?"     Yesterday AM; started noted appearance of rash 6. FEVER: "Do you have a fever?" If so, ask: "What is your temperature, how was it measured, and when did it start?"     No  7. ITCHING: "Does the rash itch?" If so, ask: "How bad is the itch?" (Scale 1-10; or mild,  moderate, severe)     Groin itches; in general is rubbing it; 5/10  8. CAUSE: "What do you think is causing the rash?"    Benzonatate 9. MEDICATION FACTORS: "Have you started any new medications within the last 2 weeks?" (e.g., antibiotics)      Amoxicillin and Benzonatate 10. OTHER SYMPTOMS: "Do you have any other symptoms?" (e.g., dizziness, headache, sore throat, joint pain)       Headache; denied dizziness, sore throat, joint pain  11. PREGNANCY: "Is there any chance you are pregnant?" "When was your last menstrual period?"       n/a  Protocols used: RASH OR REDNESS - [facility]  Message from Conception Chancy, NT sent at 04/05/2018 8:57 AM EDT   Patient is calling and states he had a cough over the weekend so he went to a clinic on Saturday 03/28/18 and states he was prescribed Amoxicillin and Benzonatate pearls. He states he now has a rash all over his body and read online that is a side effect from Benzonatate. Patient would like to know what can help with this rash or if something needs to be called in. Patient states it does itch from time to time. Please advise and contact pt.

## 2018-04-06 ENCOUNTER — Encounter: Payer: Self-pay | Admitting: Internal Medicine

## 2018-04-06 ENCOUNTER — Ambulatory Visit: Payer: Self-pay | Admitting: Internal Medicine

## 2018-04-06 VITALS — BP 122/86 | HR 71 | Temp 97.8°F | Ht 77.0 in | Wt 241.0 lb

## 2018-04-06 DIAGNOSIS — G4700 Insomnia, unspecified: Secondary | ICD-10-CM

## 2018-04-06 DIAGNOSIS — E785 Hyperlipidemia, unspecified: Secondary | ICD-10-CM

## 2018-04-06 DIAGNOSIS — I10 Essential (primary) hypertension: Secondary | ICD-10-CM

## 2018-04-06 DIAGNOSIS — R21 Rash and other nonspecific skin eruption: Secondary | ICD-10-CM

## 2018-04-06 MED ORDER — PREDNISONE 10 MG PO TABS: ORAL_TABLET | ORAL | 0 refills | Status: DC

## 2018-04-06 MED ORDER — METHYLPREDNISOLONE ACETATE 80 MG/ML IJ SUSP
80.0000 mg | Freq: Once | INTRAMUSCULAR | Status: AC
  Administered 2018-04-06: 80 mg via INTRAMUSCULAR

## 2018-04-06 MED ORDER — ATORVASTATIN CALCIUM 40 MG PO TABS: 40.0000 mg | ORAL_TABLET | Freq: Every day | ORAL | 3 refills | Status: DC

## 2018-04-06 MED ORDER — ZOLPIDEM TARTRATE 5 MG PO TABS: 5.0000 mg | ORAL_TABLET | Freq: Every evening | ORAL | 1 refills | Status: DC | PRN

## 2018-04-06 NOTE — Progress Notes (Signed)
Subjective:    Patient ID: Dustin Berry., male    DOB: 1946-06-19, 72 y.o.   MRN: [id number]  HPI   Here with acute onset diffuse rash consisting of very fine slightly raised red bumps with marked itching to torso and extremities without significant lip swelling, other angioedema, and Pt denies chest pain, increased sob or doe, wheezing, orthopnea, PND, increased LE swelling, palpitations, dizziness or syncope. Has been PCN exposed recently with amoxil per UC.   Pt denies polydipsia, polyuria. .  No known trigger or contacts   Pt denies fever, wt loss, night sweats, loss of appetite, or other constitutional symptoms    Also needs tx for persistent insomnia.  No other new complaints Past Medical History:  Diagnosis Date  . Abdominal pain 04/01/2013  . ALLERGIC RHINITIS 05/12/2008  . Arthritis   . Atrial fibrillation ([place]) 03/15/2010   14 day event monitor - 1 episode of sinus bradycardia  . Atrial fibrillation with RVR ([place]) 10/21/2012   Echo- EF 50-55%; mild concentric LVH; flow pattern suggestive of impaired LV relaxation; mild mitral valve prolapse, trace mitral regurgitation  . Back pain    receiving PT  . Boil of buttock 06/24/11  . BPH (benign prostatic hypertrophy) 09/05/2012  . Cataract   . Chest pain with exertion presumed to be tachycardia related along with SOB 04/01/2013  . ESOPHAGEAL STRICTURE 03/24/2009  . Heart murmur   . Hematuria, possible 04/01/2013  . HTN (hypertension) 12/04/2013  . HYPERLIPIDEMIA 08/18/2007  . Insomnia 06/08/2016  . INSOMNIA-SLEEP DISORDER-UNSPEC 05/14/2009  . Mitral valve prolapse 07/04/2011  . MRSA (methicillin resistant staph aureus) culture positive 5+ years ago  . Neuromuscular disorder ([place])    peripheral neuropathy  . OSA (obstructive sleep apnea) 07/04/2011   sleep study - average AHI 2.5  . Palpitations 03/06/2007   R/P MV - mild perfusion defect in basal inferoseptal, basal inferior, mid inferoseptal, and mid inferior regions, consistent w/  infarct/scar; no scintigraphic evidence of inducible myocardial ischemia; prior non transmural infarct cannot be completely excluded; EF 48%; no significant change from previous study  . PERIPHERAL NEUROPATHY 05/12/2008  . Personal history of colonic polyps 05/12/2008  . Preventative health care 06/24/2011  . Sleep apnea   . 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 CARDIOVERSION N/A 04/03/2013   Procedure: CARDIOVERSION;  Surgeon: Pixie Casino, MD;  Location: [facility] ENDOSCOPY;  Service: Cardiovascular;  Laterality: N/A;  . COLONOSCOPY    . HAND SURGERY     Thumb joint repair  . POLYPECTOMY    . TEE WITHOUT CARDIOVERSION N/A 04/03/2013   Procedure: TRANSESOPHAGEAL ECHOCARDIOGRAM (TEE);  Surgeon: Pixie Casino, MD;  Location: [facility] ENDOSCOPY;  Service: Cardiovascular;  Laterality: N/A;  . TONSILLECTOMY AND ADENOIDECTOMY    . UPPER GASTROINTESTINAL ENDOSCOPY     dilation    reports that he has never smoked. He has never used smokeless tobacco. He reports that he drinks about 0.6 oz of alcohol per week. He reports that he does not use drugs. family history includes Breast cancer in his maternal grandmother; Cancer in his sister; Dementia in his mother; Heart disease (age of onset: 52) in his father; Stroke in his maternal grandfather. No Known Allergies Current Outpatient Medications on File Prior to Visit  Medication Sig Dispense Refill  . aspirin EC 81 MG EC tablet Take 1 tablet (81 mg total) by mouth daily.    . cefdinir (OMNICEF) 300 MG capsule Take  1 capsule (300 mg total) by mouth 2 (two) times daily. 20 capsule 0  . lisinopril (PRINIVIL,ZESTRIL) 5 MG tablet Take 1 tablet (5 mg total) by mouth daily. 90 tablet 3  . metoprolol succinate (TOPROL XL) 25 MG 24 hr tablet Take 1 tablet (25 mg total) by mouth daily. 90 tablet 3  . OVER THE COUNTER MEDICATION Magnesium Citrate tablets 200 mg. Daily.     Current Facility-Administered Medications on  File Prior to Visit  Medication Dose Route Frequency Provider Last Rate Last Dose  . 0.9 %  sodium chloride infusion  500 mL Intravenous Continuous Ladene Artist, MD       Review of Systems All otherwise neg per pt    Objective:   Physical Exam BP 122/86   Pulse 71   Temp 97.8 F (36.6 C) (Oral)   Ht 6\' 5"  (1.956 m)   Wt 241 lb (109.3 kg)   SpO2 97%   BMI 28.58 kg/m  VS noted,  Constitutional: Pt appears in NAD HENT: Head: NCAT.  Right Ear: External ear normal.  Left Ear: External ear normal.  Eyes: . Pupils are equal, round, and reactive to light. Conjunctivae and EOM are normal Nose: without d/c or deformity Neck: Neck supple. Gross normal ROM Cardiovascular: Normal rate and regular rhythm.   Pulmonary/Chest: Effort normal and breath sounds without rales or wheezing.  Neurological: Pt is alert. At baseline orientation, motor grossly intact Skin: Skin is warm. No other new lesions, no LE edema but has fine erythem itchy rash to torso and extremities without other swelling Psychiatric: Pt behavior is normal without agitation  No other exam findings     Assessment & Plan:

## 2018-04-06 NOTE — Patient Instructions (Addendum)
Ok to increase the lipitor to 40 mg per day  You had the steroid shot today  Please take all new medication as prescribed -the prednisone  Please continue all other medications as before, and refills have been done if requested - the ambien  Please have the pharmacy call with any other refills you may need.  Please continue your efforts at being more active, low cholesterol diet, and weight control.  Please keep your appointments with your specialists as you may have planned

## 2018-04-07 DIAGNOSIS — R21 Rash and other nonspecific skin eruption: Secondary | ICD-10-CM | POA: Insufficient documentation

## 2018-04-07 NOTE — Assessment & Plan Note (Signed)
Ok for med refill,  to f/u any worsening symptoms or concerns  

## 2018-04-07 NOTE — Assessment & Plan Note (Addendum)
Mild to mod, likely related to Amoxil exposure, to be place on allergy list, also for depomedrol IM 80, predpac asd, benadryl prn,  to f/u any worsening symptoms or concerns

## 2018-04-07 NOTE — Assessment & Plan Note (Signed)
Lab Results  Component Value Date   LDLCALC 115 (H) 02/02/2018  goal < 70, for increased lipitor 40 qd, lower chol diet

## 2018-04-07 NOTE — Assessment & Plan Note (Signed)
stable overall by history and exam, recent data reviewed with pt, and pt to continue medical treatment as before,  to f/u any worsening symptoms or concerns BP Readings from Last 3 Encounters:  04/06/18 122/86  03/07/18 124/88  01/29/18 120/78

## 2018-08-01 ENCOUNTER — Ambulatory Visit (HOSPITAL_COMMUNITY): Payer: Medicare Other | Attending: Cardiovascular Disease

## 2018-08-11 ENCOUNTER — Other Ambulatory Visit: Payer: Self-pay | Admitting: Internal Medicine

## 2018-08-16 ENCOUNTER — Encounter (HOSPITAL_COMMUNITY): Payer: Self-pay | Admitting: Radiology

## 2018-12-13 ENCOUNTER — Ambulatory Visit (INDEPENDENT_AMBULATORY_CARE_PROVIDER_SITE_OTHER): Payer: 59 | Admitting: Internal Medicine

## 2018-12-13 ENCOUNTER — Encounter: Payer: Self-pay | Admitting: Internal Medicine

## 2018-12-13 VITALS — BP 118/80 | HR 73 | Temp 98.4°F | Ht 77.0 in | Wt 230.0 lb

## 2018-12-13 DIAGNOSIS — I7121 Aneurysm of the ascending aorta, without rupture: Secondary | ICD-10-CM

## 2018-12-13 DIAGNOSIS — R634 Abnormal weight loss: Secondary | ICD-10-CM

## 2018-12-13 DIAGNOSIS — Z0001 Encounter for general adult medical examination with abnormal findings: Secondary | ICD-10-CM

## 2018-12-13 DIAGNOSIS — I712 Thoracic aortic aneurysm, without rupture, unspecified: Secondary | ICD-10-CM

## 2018-12-13 DIAGNOSIS — M25562 Pain in left knee: Secondary | ICD-10-CM | POA: Diagnosis not present

## 2018-12-13 DIAGNOSIS — E538 Deficiency of other specified B group vitamins: Secondary | ICD-10-CM | POA: Diagnosis not present

## 2018-12-13 DIAGNOSIS — G8929 Other chronic pain: Secondary | ICD-10-CM

## 2018-12-13 DIAGNOSIS — E559 Vitamin D deficiency, unspecified: Secondary | ICD-10-CM | POA: Diagnosis not present

## 2018-12-13 DIAGNOSIS — I10 Essential (primary) hypertension: Secondary | ICD-10-CM

## 2018-12-13 HISTORY — DX: Pain in left knee: M25.562

## 2018-12-13 HISTORY — DX: Aneurysm of the ascending aorta, without rupture: I71.21

## 2018-12-13 NOTE — Assessment & Plan Note (Signed)
stable overall by history and exam, recent data reviewed with pt, and pt to continue medical treatment as before,  to f/u any worsening symptoms or concerns  

## 2018-12-13 NOTE — Progress Notes (Signed)
Subjective:    Patient ID: Dustin Ferries., male    DOB: September 30, 1946, 73 y.o.   MRN: [id number]  HPI  Here for wellness and f/u;  Overall doing ok;  Pt denies Chest pain, worsening SOB, DOE, wheezing, orthopnea, PND, worsening LE edema, palpitations, dizziness or syncope.  Pt denies neurological change such as new headache, facial or extremity weakness.  Pt denies polydipsia, polyuria, or low sugar symptoms. Pt states overall good compliance with treatment and medications, good tolerability, and has been trying to follow appropriate diet.  Pt denies worsening depressive symptoms, suicidal ideation or panic. No fever, night sweats,  Pt states good ability with ADL's, has low fall risk, home safety reviewed and adequate, no other significant changes in hearing or vision, and only occasionally active with exercise.  Has lost appettite since last mid November with wt loss.  No diet change.  Wife very concerned Wt Readings from Last 3 Encounters:  12/13/18 230 lb (104.3 kg)  04/06/18 241 lb (109.3 kg)  03/07/18 246 lb (111.6 kg)  No OSA by most recent testing, not having to use the cpap.  No other new complaints except having ongoing gradually worsening left knee pain and stiffness with intermittent swelling but no giveaways or falls Past Medical History:  Diagnosis Date  . Abdominal pain 04/01/2013  . ALLERGIC RHINITIS 05/12/2008  . Arthritis   . Atrial fibrillation ([place]) 03/15/2010   14 day event monitor - 1 episode of sinus bradycardia  . Atrial fibrillation with RVR ([place]) 10/21/2012   Echo- EF 50-55%; mild concentric LVH; flow pattern suggestive of impaired LV relaxation; mild mitral valve prolapse, trace mitral regurgitation  . Back pain    receiving PT  . Boil of buttock 06/24/11  . BPH (benign prostatic hypertrophy) 09/05/2012  . Cataract   . Chest pain with exertion presumed to be tachycardia related along with SOB 04/01/2013  . ESOPHAGEAL STRICTURE 03/24/2009  . Heart murmur   .  Hematuria, possible 04/01/2013  . HTN (hypertension) 12/04/2013  . HYPERLIPIDEMIA 08/18/2007  . Insomnia 06/08/2016  . INSOMNIA-SLEEP DISORDER-UNSPEC 05/14/2009  . Mitral valve prolapse 07/04/2011  . MRSA (methicillin resistant staph aureus) culture positive 5+ years ago  . Neuromuscular disorder ([place])    peripheral neuropathy  . OSA (obstructive sleep apnea) 07/04/2011   sleep study - average AHI 2.5  . Palpitations 03/06/2007   R/P MV - mild perfusion defect in basal inferoseptal, basal inferior, mid inferoseptal, and mid inferior regions, consistent w/ infarct/scar; no scintigraphic evidence of inducible myocardial ischemia; prior non transmural infarct cannot be completely excluded; EF 48%; no significant change from previous study  . PERIPHERAL NEUROPATHY 05/12/2008  . Personal history of colonic polyps 05/12/2008  . Preventative health care 06/24/2011  . Sleep apnea   . 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 CARDIOVERSION N/A 04/03/2013   Procedure: CARDIOVERSION;  Surgeon: Pixie Casino, MD;  Location: [facility] ENDOSCOPY;  Service: Cardiovascular;  Laterality: N/A;  . COLONOSCOPY    . HAND SURGERY     Thumb joint repair  . POLYPECTOMY    . TEE WITHOUT CARDIOVERSION N/A 04/03/2013   Procedure: TRANSESOPHAGEAL ECHOCARDIOGRAM (TEE);  Surgeon: Pixie Casino, MD;  Location[facility] ENDOSCOPY;  Service: Cardiovascular;  Laterality: N/A;  . TONSILLECTOMY AND ADENOIDECTOMY    . UPPER GASTROINTESTINAL ENDOSCOPY     dilation    reports that he has never smoked. He has never used smokeless tobacco. He reports  current alcohol use of about 1.0 standard drinks of alcohol per week. He reports that he does not use drugs. family history includes Breast cancer in his maternal grandmother; Cancer in his sister; Dementia in his mother; Heart disease (age of onset: 6) in his father; Stroke in his maternal grandfather. Allergies  Allergen Reactions  . Amoxil  [Amoxicillin] Rash   Current Outpatient Medications on File Prior to Visit  Medication Sig Dispense Refill  . atorvastatin (LIPITOR) 40 MG tablet Take 1 tablet (40 mg total) by mouth daily. 90 tablet 3  . lisinopril (PRINIVIL,ZESTRIL) 5 MG tablet TAKE 1 TABLET BY MOUTH EVERY DAY 90 tablet 2  . metoprolol succinate (TOPROL XL) 25 MG 24 hr tablet Take 1 tablet (25 mg total) by mouth daily. 90 tablet 3  . OVER THE COUNTER MEDICATION Magnesium Citrate tablets 200 mg. Daily.    Marland Kitchen zolpidem (AMBIEN) 5 MG tablet Take 1 tablet (5 mg total) by mouth at bedtime as needed for sleep. 90 tablet 1   No current facility-administered medications on file prior to visit.    Review of Systems Constitutional: Negative for other unusual diaphoresis, sweats, appetite or weight changes HENT: Negative for other worsening hearing loss, ear pain, facial swelling, mouth sores or neck stiffness.   Eyes: Negative for other worsening pain, redness or other visual disturbance.  Respiratory: Negative for other stridor or swelling Cardiovascular: Negative for other palpitations or other chest pain  Gastrointestinal: Negative for worsening diarrhea or loose stools, blood in stool, distention or other pain Genitourinary: Negative for hematuria, flank pain or other change in urine volume.  Musculoskeletal: Negative for myalgias or other joint swelling.  Skin: Negative for other color change, or other wound or worsening drainage.  Neurological: Negative for other syncope or numbness. Hematological: Negative for other adenopathy or swelling Psychiatric/Behavioral: Negative for hallucinations, other worsening agitation, SI, self-injury, or new decreased concentration ALl other system neg per pt    Objective:   Physical Exam BP 118/80   Pulse 73   Temp 98.4 F (36.9 C) (Oral)   Ht 6\' 5"  (1.956 m)   Wt 230 lb (104.3 kg)   SpO2 94%   BMI 27.27 kg/m  VS noted, normal wt but has visibly lost Constitutional: Pt is oriented  to person, place, and time. Appears well-developed and well-nourished, in no significant distress and comfortable Head: Normocephalic and atraumatic  Eyes: Conjunctivae and EOM are normal. Pupils are equal, round, and reactive to light Right Ear: External ear normal without discharge Left Ear: External ear normal without discharge Nose: Nose without discharge or deformity Mouth/Throat: Oropharynx is without other ulcerations and moist  Neck: Normal range of motion. Neck supple. No JVD present. No tracheal deviation present or significant neck LA or mass Cardiovascular: Normal rate, regular rhythm, normal heart sounds and intact distal pulses.   Pulmonary/Chest: WOB normal and breath sounds without rales or wheezing  Abdominal: Soft. Bowel sounds are normal. NT. No HSM  Musculoskeletal: Normal range of motion. Exhibits no edema Lymphadenopathy: Has no other cervical adenopathy.  Neurological: Pt is alert and oriented to person, place, and time. Pt has normal reflexes. No cranial nerve deficit. Motor grossly intact, Gait intact Skin: Skin is warm and dry. No rash noted or new ulcerations Psychiatric:  Has normal mood and affect. Behavior is normal without agitation No other exam findings Lab Results  Component Value Date   WBC 3.8 02/02/2018   HGB 15.4 02/02/2018   HCT 45.2 02/02/2018   PLT 227  02/02/2018   GLUCOSE 104 (H) 02/02/2018   CHOL 170 02/02/2018   TRIG 83 02/02/2018   HDL 38 (L) 02/02/2018   LDLCALC 115 (H) 02/02/2018   ALT 22 02/02/2018   AST 19 02/02/2018   NA 143 02/02/2018   K 5.0 02/02/2018   CL 108 (H) 02/02/2018   CREATININE 1.08 02/02/2018   BUN 17 02/02/2018   CO2 23 02/02/2018   TSH 2.750 02/02/2018   PSA 0.80 06/01/2016   INR 1.19 04/01/2013       Assessment & Plan:

## 2018-12-13 NOTE — Patient Instructions (Addendum)
Please continue all other medications as before, and refills have been done if requested.  Please have the pharmacy call with any other refills you may need.  Please continue your efforts at being more active, low cholesterol diet, and weight control.  You are otherwise up to date with prevention measures today.  Please keep your appointments with your specialists as you may have planned  You will be contacted regarding the referral for: CTA of the Chest, and CTA abd and pelvis (see [place] now)  Please go to the LAB in the Basement (turn left off the elevator) for the tests to be done today  You will be contacted by phone if any changes need to be made immediately.  Otherwise, you will receive a letter about your results with an explanation, but please check with MyChart first.  Please remember to sign up for MyChart if you have not done so, as this will be important to you in the future with finding out test results, communicating by private email, and scheduling acute appointments online when needed.  Please return in 6 months, or sooner if needed

## 2018-12-13 NOTE — Assessment & Plan Note (Signed)

## 2018-12-13 NOTE — Assessment & Plan Note (Signed)
With stiffness and pain, for f/u sports medicine

## 2018-12-13 NOTE — Assessment & Plan Note (Addendum)
Etiology unclear, ok for CT abd/pelvis and CTA as above  In addition to the time spent performing CPE, I spent an additional 25 minutes face to face,in which greater than 50% of this time was spent in counseling and coordination of care for patient's acute illness as documented, including the differential dx, treatment, further evaluation and other management of wt loss, left knee pain, ascendin aortic aneurysm, HTN

## 2018-12-13 NOTE — Assessment & Plan Note (Signed)
Asympt, for f/u cta chest also r/o malignancy

## 2018-12-25 ENCOUNTER — Inpatient Hospital Stay: Admission: RE | Admit: 2018-12-25 | Payer: 59 | Source: Ambulatory Visit

## 2018-12-25 ENCOUNTER — Other Ambulatory Visit: Payer: 59

## 2019-01-09 ENCOUNTER — Encounter: Payer: Self-pay | Admitting: Internal Medicine

## 2019-01-09 ENCOUNTER — Other Ambulatory Visit (INDEPENDENT_AMBULATORY_CARE_PROVIDER_SITE_OTHER): Payer: 59

## 2019-01-09 DIAGNOSIS — L820 Inflamed seborrheic keratosis: Secondary | ICD-10-CM | POA: Diagnosis not present

## 2019-01-09 DIAGNOSIS — Z0001 Encounter for general adult medical examination with abnormal findings: Secondary | ICD-10-CM | POA: Diagnosis not present

## 2019-01-09 DIAGNOSIS — Z85828 Personal history of other malignant neoplasm of skin: Secondary | ICD-10-CM | POA: Diagnosis not present

## 2019-01-09 DIAGNOSIS — E559 Vitamin D deficiency, unspecified: Secondary | ICD-10-CM

## 2019-01-09 DIAGNOSIS — D225 Melanocytic nevi of trunk: Secondary | ICD-10-CM | POA: Diagnosis not present

## 2019-01-09 DIAGNOSIS — E538 Deficiency of other specified B group vitamins: Secondary | ICD-10-CM | POA: Diagnosis not present

## 2019-01-09 DIAGNOSIS — Z1159 Encounter for screening for other viral diseases: Secondary | ICD-10-CM

## 2019-01-09 DIAGNOSIS — D485 Neoplasm of uncertain behavior of skin: Secondary | ICD-10-CM | POA: Diagnosis not present

## 2019-01-09 DIAGNOSIS — D2262 Melanocytic nevi of left upper limb, including shoulder: Secondary | ICD-10-CM | POA: Diagnosis not present

## 2019-01-09 DIAGNOSIS — D2261 Melanocytic nevi of right upper limb, including shoulder: Secondary | ICD-10-CM | POA: Diagnosis not present

## 2019-01-09 DIAGNOSIS — L570 Actinic keratosis: Secondary | ICD-10-CM | POA: Diagnosis not present

## 2019-01-09 DIAGNOSIS — L821 Other seborrheic keratosis: Secondary | ICD-10-CM | POA: Diagnosis not present

## 2019-01-09 LAB — LIPID PANEL
CHOL/HDL RATIO: 4
Cholesterol: 149 mg/dL (ref 0–200)
HDL: 40.9 mg/dL (ref >39.00)
LDL Cholesterol: 95 mg/dL (ref 0–99)
NonHDL: 108.35
Triglycerides: 67 mg/dL (ref 0.0–149.0)
VLDL: 13.4 mg/dL (ref 0.0–40.0)

## 2019-01-09 LAB — CBC WITH DIFFERENTIAL/PLATELET
Basophils Absolute: 0 10*3/uL (ref 0.0–0.1)
Basophils Relative: 0.9 % (ref 0.0–3.0)
Eosinophils Absolute: 0.2 10*3/uL (ref 0.0–0.7)
Eosinophils Relative: 3.2 % (ref 0.0–5.0)
HEMATOCRIT: 45.8 % (ref 39.0–52.0)
HEMOGLOBIN: 15.5 g/dL (ref 13.0–17.0)
LYMPHS PCT: 47.8 % — AB (ref 12.0–46.0)
Lymphs Abs: 2.2 10*3/uL (ref 0.7–4.0)
MCHC: 33.9 g/dL (ref 30.0–36.0)
MCV: 89 fl (ref 78.0–100.0)
MONOS PCT: 13.1 % — AB (ref 3.0–12.0)
Monocytes Absolute: 0.6 10*3/uL (ref 0.1–1.0)
Neutro Abs: 1.6 10*3/uL (ref 1.4–7.7)
Neutrophils Relative %: 35 % — ABNORMAL LOW (ref 43.0–77.0)
Platelets: 249 10*3/uL (ref 150.0–400.0)
RBC: 5.14 Mil/uL (ref 4.22–5.81)
RDW: 14.3 % (ref 11.5–15.5)
WBC: 4.7 10*3/uL (ref 4.0–10.5)

## 2019-01-09 LAB — HEPATIC FUNCTION PANEL
ALT: 16 U/L (ref 0–53)
AST: 16 U/L (ref 0–37)
Albumin: 4.3 g/dL (ref 3.5–5.2)
Alkaline Phosphatase: 66 U/L (ref 39–117)
Bilirubin, Direct: 0.2 mg/dL (ref 0.0–0.3)
Total Bilirubin: 0.9 mg/dL (ref 0.2–1.2)
Total Protein: 7 g/dL (ref 6.0–8.3)

## 2019-01-09 LAB — URINALYSIS, ROUTINE W REFLEX MICROSCOPIC
Bilirubin Urine: NEGATIVE
Hgb urine dipstick: NEGATIVE
Ketones, ur: NEGATIVE
Leukocytes, UA: NEGATIVE
Nitrite: NEGATIVE
Specific Gravity, Urine: 1.015 (ref 1.000–1.030)
Total Protein, Urine: NEGATIVE
Urine Glucose: NEGATIVE
Urobilinogen, UA: 0.2 (ref 0.0–1.0)
pH: 6 (ref 5.0–8.0)

## 2019-01-09 LAB — BASIC METABOLIC PANEL
BUN: 12 mg/dL (ref 6–23)
CO2: 26 mEq/L (ref 19–32)
Calcium: 9.6 mg/dL (ref 8.4–10.5)
Chloride: 106 mEq/L (ref 96–112)
Creatinine, Ser: 1.04 mg/dL (ref 0.40–1.50)
GFR: 70.27 mL/min (ref >60.00)
Glucose, Bld: 82 mg/dL (ref 70–99)
Potassium: 4.2 mEq/L (ref 3.5–5.1)
Sodium: 141 mEq/L (ref 135–145)

## 2019-01-09 LAB — VITAMIN B12: Vitamin B-12: 157 pg/mL — ABNORMAL LOW (ref 211–911)

## 2019-01-09 LAB — VITAMIN D 25 HYDROXY (VIT D DEFICIENCY, FRACTURES): VITD: 42.7 ng/mL (ref 30.00–100.00)

## 2019-01-09 LAB — PSA: PSA: 0.9 ng/mL (ref 0.10–4.00)

## 2019-01-09 LAB — TSH: TSH: 3.23 u[IU]/mL (ref 0.35–4.50)

## 2019-01-10 ENCOUNTER — Ambulatory Visit (INDEPENDENT_AMBULATORY_CARE_PROVIDER_SITE_OTHER)
Admission: RE | Admit: 2019-01-10 | Discharge: 2019-01-10 | Disposition: A | Discharge status: Home / Self Care | Payer: Medicare HMO | Source: Ambulatory Visit | Attending: Internal Medicine | Admitting: Internal Medicine

## 2019-01-10 ENCOUNTER — Telehealth: Payer: Self-pay

## 2019-01-10 DIAGNOSIS — I712 Thoracic aortic aneurysm, without rupture, unspecified: Secondary | ICD-10-CM

## 2019-01-10 DIAGNOSIS — R634 Abnormal weight loss: Secondary | ICD-10-CM

## 2019-01-10 LAB — HEPATITIS C ANTIBODY
Hepatitis C Ab: NONREACTIVE
SIGNAL TO CUT-OFF: 0.02 (ref <1.00)

## 2019-01-10 MED ORDER — IOPAMIDOL (ISOVUE-370) INJECTION 76%: 100.0000 mL | Freq: Once | INTRAVENOUS | Status: DC | PRN

## 2019-01-10 MED ORDER — IOPAMIDOL (ISOVUE-370) INJECTION 76%
100.0000 mL | Freq: Once | INTRAVENOUS | Status: AC | PRN
  Administered 2019-01-10: 100 mL via INTRAVENOUS

## 2019-01-10 NOTE — Telephone Encounter (Signed)
Telephone is not a working number

## 2019-01-10 NOTE — Telephone Encounter (Signed)
-----   Message from Biagio Borg, MD sent at 01/09/2019  5:20 PM EST ----- Left message on MyChart, pt to cont same tx except  The test results show that your current treatment is OK, except the Vitamin B12 level is quite low.  Please start monthly B12 injections for now, for which we may be able to change to oral B12 at some point in the future.  I will ask the office to call you to get this started  Edona Schreffler to please inform pt, and arrange for monthly b12 shots

## 2019-01-11 ENCOUNTER — Telehealth: Payer: Self-pay

## 2019-01-11 ENCOUNTER — Encounter: Payer: Self-pay | Admitting: Internal Medicine

## 2019-01-11 NOTE — Telephone Encounter (Signed)
-----   Message from Biagio Borg, MD sent at 01/11/2019 12:12 PM EST ----- Left message on MyChart, pt to cont same tx except  The test results show that your current treatment is OK, as the CT of the chest and abdomen and pelvis are negative for significant new findings.  The aneurysm appears to be stable, but there is the recommendation to have this rechecked in 6 months.Redmond Baseman to please inform pt

## 2019-01-11 NOTE — Telephone Encounter (Signed)
Pt has been informed and expressed understanding.  

## 2019-01-28 ENCOUNTER — Telehealth: Payer: Self-pay | Admitting: Internal Medicine

## 2019-01-28 NOTE — Telephone Encounter (Signed)
Pt's insurance has denied his CTA Aorta due to pt not bringing new insurance information before his scan. Pt informed

## 2019-01-30 DIAGNOSIS — H524 Presbyopia: Secondary | ICD-10-CM | POA: Diagnosis not present

## 2019-02-16 ENCOUNTER — Other Ambulatory Visit: Payer: Self-pay | Admitting: Cardiovascular Disease

## 2019-02-20 ENCOUNTER — Other Ambulatory Visit: Payer: Self-pay | Admitting: Internal Medicine

## 2019-02-20 NOTE — Telephone Encounter (Signed)
Done erx 

## 2019-03-14 ENCOUNTER — Other Ambulatory Visit: Payer: Self-pay | Admitting: Internal Medicine

## 2019-04-02 DIAGNOSIS — M25531 Pain in right wrist: Secondary | ICD-10-CM | POA: Diagnosis not present

## 2019-04-02 DIAGNOSIS — M25561 Pain in right knee: Secondary | ICD-10-CM | POA: Diagnosis not present

## 2019-04-02 DIAGNOSIS — M25562 Pain in left knee: Secondary | ICD-10-CM | POA: Diagnosis not present

## 2019-04-02 DIAGNOSIS — M25532 Pain in left wrist: Secondary | ICD-10-CM | POA: Diagnosis not present

## 2019-04-03 ENCOUNTER — Telehealth: Payer: Self-pay | Admitting: Cardiovascular Disease

## 2019-04-03 NOTE — Telephone Encounter (Signed)
Informed pt that appt with Dr. Claiborne Billings has been rescheduled to 08/05/19 at 11:40 AM. Also informed that Echo needs to be scheduled. Will send message to Neil Crouch to schedule. Pt verbalized thanks.

## 2019-04-04 ENCOUNTER — Telehealth: Payer: Medicare HMO | Admitting: Cardiovascular Disease

## 2019-04-05 DIAGNOSIS — R3915 Urgency of urination: Secondary | ICD-10-CM | POA: Diagnosis not present

## 2019-04-05 DIAGNOSIS — M25561 Pain in right knee: Secondary | ICD-10-CM | POA: Diagnosis not present

## 2019-04-05 DIAGNOSIS — D1771 Benign lipomatous neoplasm of kidney: Secondary | ICD-10-CM | POA: Diagnosis not present

## 2019-04-08 DIAGNOSIS — M25531 Pain in right wrist: Secondary | ICD-10-CM | POA: Diagnosis not present

## 2019-04-12 ENCOUNTER — Telehealth: Payer: Self-pay | Admitting: Internal Medicine

## 2019-04-12 NOTE — Telephone Encounter (Signed)
Copied from [place] [id number]. Topic: General - Other >> Apr 12, 2019  4:49 PM Dustin Berry, Marland Kitchen wrote: Reason for CRM: pt is planning to buy a hot tub.  He is asking for a note that the hot tub will help with the arthritis in his knees and hips.  If he can get a letter, they will not charge him sales tax for the hot tub.  Please call to discuss  Cb is [phone]

## 2019-04-15 ENCOUNTER — Encounter: Payer: Self-pay | Admitting: Internal Medicine

## 2019-04-15 NOTE — Telephone Encounter (Signed)
Ok this is done, to print and give to shirron may 12

## 2019-04-16 NOTE — Telephone Encounter (Signed)
I just havent printed off yet -

## 2019-04-16 NOTE — Telephone Encounter (Signed)
°  Pt said he come by around 3:30 to pick up the letter

## 2019-04-16 NOTE — Telephone Encounter (Signed)
Called pt, LVM.   

## 2019-05-01 DIAGNOSIS — H538 Other visual disturbances: Secondary | ICD-10-CM | POA: Diagnosis not present

## 2019-05-01 DIAGNOSIS — H43812 Vitreous degeneration, left eye: Secondary | ICD-10-CM | POA: Diagnosis not present

## 2019-05-01 DIAGNOSIS — Z0100 Encounter for examination of eyes and vision without abnormal findings: Secondary | ICD-10-CM | POA: Diagnosis not present

## 2019-05-13 DIAGNOSIS — M1990 Unspecified osteoarthritis, unspecified site: Secondary | ICD-10-CM | POA: Diagnosis not present

## 2019-05-13 DIAGNOSIS — Z8249 Family history of ischemic heart disease and other diseases of the circulatory system: Secondary | ICD-10-CM | POA: Diagnosis not present

## 2019-05-13 DIAGNOSIS — Z791 Long term (current) use of non-steroidal anti-inflammatories (NSAID): Secondary | ICD-10-CM | POA: Diagnosis not present

## 2019-05-13 DIAGNOSIS — Z880 Allergy status to penicillin: Secondary | ICD-10-CM | POA: Diagnosis not present

## 2019-05-13 DIAGNOSIS — I10 Essential (primary) hypertension: Secondary | ICD-10-CM | POA: Diagnosis not present

## 2019-05-13 DIAGNOSIS — G8929 Other chronic pain: Secondary | ICD-10-CM | POA: Diagnosis not present

## 2019-05-13 DIAGNOSIS — E785 Hyperlipidemia, unspecified: Secondary | ICD-10-CM | POA: Diagnosis not present

## 2019-05-13 DIAGNOSIS — I4891 Unspecified atrial fibrillation: Secondary | ICD-10-CM | POA: Diagnosis not present

## 2019-05-14 ENCOUNTER — Other Ambulatory Visit: Payer: Self-pay | Admitting: Cardiovascular Disease

## 2019-05-19 ENCOUNTER — Other Ambulatory Visit: Payer: Self-pay | Admitting: Internal Medicine

## 2019-06-12 ENCOUNTER — Other Ambulatory Visit: Payer: Self-pay

## 2019-06-12 ENCOUNTER — Ambulatory Visit (INDEPENDENT_AMBULATORY_CARE_PROVIDER_SITE_OTHER): Payer: Medicare HMO | Admitting: Internal Medicine

## 2019-06-12 ENCOUNTER — Encounter: Payer: Self-pay | Admitting: Internal Medicine

## 2019-06-12 VITALS — BP 126/84 | HR 78 | Temp 98.0°F | Ht 77.0 in | Wt 239.0 lb

## 2019-06-12 DIAGNOSIS — Z0000 Encounter for general adult medical examination without abnormal findings: Secondary | ICD-10-CM | POA: Diagnosis not present

## 2019-06-12 DIAGNOSIS — E559 Vitamin D deficiency, unspecified: Secondary | ICD-10-CM

## 2019-06-12 DIAGNOSIS — E611 Iron deficiency: Secondary | ICD-10-CM

## 2019-06-12 DIAGNOSIS — I10 Essential (primary) hypertension: Secondary | ICD-10-CM | POA: Diagnosis not present

## 2019-06-12 DIAGNOSIS — E785 Hyperlipidemia, unspecified: Secondary | ICD-10-CM

## 2019-06-12 DIAGNOSIS — Z23 Encounter for immunization: Secondary | ICD-10-CM

## 2019-06-12 DIAGNOSIS — E538 Deficiency of other specified B group vitamins: Secondary | ICD-10-CM

## 2019-06-12 MED ORDER — TRAMADOL HCL 50 MG PO TABS: 50.0000 mg | ORAL_TABLET | Freq: Four times a day (QID) | ORAL | 0 refills | Status: DC | PRN

## 2019-06-12 MED ORDER — TIZANIDINE HCL 2 MG PO TABS: 2.0000 mg | ORAL_TABLET | Freq: Four times a day (QID) | ORAL | 1 refills | Status: DC | PRN

## 2019-06-12 MED ORDER — CYANOCOBALAMIN 1000 MCG/ML IJ SOLN
1000.0000 ug | Freq: Once | INTRAMUSCULAR | Status: AC
  Administered 2019-06-12: 1000 ug via INTRAMUSCULAR

## 2019-06-12 NOTE — Progress Notes (Signed)
Subjective:    Patient ID: Dustin Berry., male    DOB: 11/13/1946, 73 y.o.   MRN: [id number]  HPI  Here to f/u; overall doing ok,  Pt denies chest pain, increasing sob or doe, wheezing, orthopnea, PND, increased LE swelling, palpitations, dizziness or syncope.  Pt denies new neurological symptoms such as new headache, or facial or extremity weakness or numbness.  Pt denies polydipsia, polyuria, or low sugar episode.  Pt states overall good compliance with meds, mostly trying to follow appropriate diet, with wt overall stable,  but little exercise however. Wt Readings from Last 3 Encounters:  06/12/19 239 lb (108.4 kg)  12/13/18 230 lb (104.3 kg)  04/06/18 241 lb (109.3 kg)  Pt continues to have recurring LBP without change in severity, bowel or bladder change, fever, wt loss,  worsening LE pain/numbness/weakness, gait change or falls.  Due for B12 shot today for proven deficiency  No other new complaints Past Medical History:  Diagnosis Date  . Abdominal pain 04/01/2013  . ALLERGIC RHINITIS 05/12/2008  . Arthritis   . Atrial fibrillation ([place]) 03/15/2010   14 day event monitor - 1 episode of sinus bradycardia  . Atrial fibrillation with RVR ([place]) 10/21/2012   Echo- EF 50-55%; mild concentric LVH; flow pattern suggestive of impaired LV relaxation; mild mitral valve prolapse, trace mitral regurgitation  . Back pain    receiving PT  . Boil of buttock 06/24/11  . BPH (benign prostatic hypertrophy) 09/05/2012  . Cataract   . Chest pain with exertion presumed to be tachycardia related along with SOB 04/01/2013  . ESOPHAGEAL STRICTURE 03/24/2009  . Heart murmur   . Hematuria, possible 04/01/2013  . HTN (hypertension) 12/04/2013  . HYPERLIPIDEMIA 08/18/2007  . Insomnia 06/08/2016  . INSOMNIA-SLEEP DISORDER-UNSPEC 05/14/2009  . Mitral valve prolapse 07/04/2011  . MRSA (methicillin resistant staph aureus) culture positive 5+ years ago  . Neuromuscular disorder ([place])    peripheral neuropathy  .  OSA (obstructive sleep apnea) 07/04/2011   sleep study - average AHI 2.5  . Palpitations 03/06/2007   R/P MV - mild perfusion defect in basal inferoseptal, basal inferior, mid inferoseptal, and mid inferior regions, consistent w/ infarct/scar; no scintigraphic evidence of inducible myocardial ischemia; prior non transmural infarct cannot be completely excluded; EF 48%; no significant change from previous study  . PERIPHERAL NEUROPATHY 05/12/2008  . Personal history of colonic polyps 05/12/2008  . Preventative health care 06/24/2011  . Sleep apnea   . 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 CARDIOVERSION N/A 04/03/2013   Procedure: CARDIOVERSION;  Surgeon: Pixie Casino, MD;  Location: [facility] ENDOSCOPY;  Service: Cardiovascular;  Laterality: N/A;  . COLONOSCOPY    . HAND SURGERY     Thumb joint repair  . POLYPECTOMY    . TEE WITHOUT CARDIOVERSION N/A 04/03/2013   Procedure: TRANSESOPHAGEAL ECHOCARDIOGRAM (TEE);  Surgeon: Pixie Casino, MD;  Location: [facility] ENDOSCOPY;  Service: Cardiovascular;  Laterality: N/A;  . TONSILLECTOMY AND ADENOIDECTOMY    . UPPER GASTROINTESTINAL ENDOSCOPY     dilation    reports that he has never smoked. He has never used smokeless tobacco. He reports current alcohol use of about 1.0 standard drinks of alcohol per week. He reports that he does not use drugs. family history includes Breast cancer in his maternal grandmother; Cancer in his sister; Dementia in his mother; Heart disease (age of onset: 38) in his father; Stroke in his maternal grandfather. Allergies  Allergen Reactions  . Amoxil [Amoxicillin] Rash   Current Outpatient Medications on File Prior to Visit  Medication Sig Dispense Refill  . atorvastatin (LIPITOR) 40 MG tablet TAKE 1 TABLET BY MOUTH EVERY DAY 90 tablet 0  . lisinopril (ZESTRIL) 5 MG tablet TAKE 1 TABLET BY MOUTH EVERY DAY 90 tablet 0  . metoprolol succinate (TOPROL-XL) 25 MG 24 hr tablet Take 1  tablet (25 mg total) by mouth daily. OFFICE VISIT NEEDED 90 tablet 0  . OVER THE COUNTER MEDICATION Magnesium Citrate tablets 200 mg. Daily.    Marland Kitchen zolpidem (AMBIEN) 5 MG tablet TAKE 1 TABLET (5 MG TOTAL) BY MOUTH AT BEDTIME AS NEEDED FOR SLEEP. 90 tablet 1   No current facility-administered medications on file prior to visit.    Review of Systems  Constitutional: Negative for other unusual diaphoresis or sweats HENT: Negative for ear discharge or swelling Eyes: Negative for other worsening visual disturbances Respiratory: Negative for stridor or other swelling  Gastrointestinal: Negative for worsening distension or other blood Genitourinary: Negative for retention or other urinary change Musculoskeletal: Negative for other MSK pain or swelling Skin: Negative for color change or other new lesions Neurological: Negative for worsening tremors and other numbness  Psychiatric/Behavioral: Negative for worsening agitation or other fatigue All other system neg per pt    Objective:   Physical Exam BP 126/84   Pulse 78   Temp 98 F (36.7 C) (Oral)   Ht 6\' 5"  (1.956 m)   Wt 239 lb (108.4 kg)   SpO2 98%   BMI 28.34 kg/m  VS noted,  Constitutional: Pt appears in NAD HENT: Head: NCAT.  Right Ear: External ear normal.  Left Ear: External ear normal.  Eyes: . Pupils are equal, round, and reactive to light. Conjunctivae and EOM are normal Nose: without d/c or deformity Neck: Neck supple. Gross normal ROM Cardiovascular: Normal rate and regular rhythm.   Pulmonary/Chest: Effort normal and breath sounds without rales or wheezing.  Abd:  Soft, NT, ND, + BS, no organomegaly Neurological: Pt is alert. At baseline orientation, motor grossly intact Skin: Skin is warm. No rashes, other new lesions, no LE edema Psychiatric: Pt behavior is normal without agitation  No other exam findings Lab Results  Component Value Date   WBC 4.7 01/09/2019   HGB 15.5 01/09/2019   HCT 45.8 01/09/2019   PLT  249.0 01/09/2019   GLUCOSE 82 01/09/2019   CHOL 149 01/09/2019   TRIG 67.0 01/09/2019   HDL 40.90 01/09/2019   LDLCALC 95 01/09/2019   ALT 16 01/09/2019   AST 16 01/09/2019   NA 141 01/09/2019   K 4.2 01/09/2019   CL 106 01/09/2019   CREATININE 1.04 01/09/2019   BUN 12 01/09/2019   CO2 26 01/09/2019   TSH 3.23 01/09/2019   PSA 0.90 01/09/2019   INR 1.19 04/01/2013       Assessment & Plan:

## 2019-06-12 NOTE — Patient Instructions (Addendum)
You had the Tdap tetanus shot today  You had the B12 shot today  Please make a Nurse VIsit appt monthly for more b12 shots until you return for next visit  Please take all new medication as prescribed - the pain medication, and muscle relaxer as needed  Please continue all other medications as before, and refills have been done if requested.  Please have the pharmacy call with any other refills you may need.  Please continue your efforts at being more active, low cholesterol diet, and weight contro.  Please keep your appointments with your specialists as you may have planned

## 2019-06-15 ENCOUNTER — Encounter: Payer: Self-pay | Admitting: Internal Medicine

## 2019-06-15 DIAGNOSIS — E538 Deficiency of other specified B group vitamins: Secondary | ICD-10-CM | POA: Insufficient documentation

## 2019-06-15 HISTORY — DX: Deficiency of other specified B group vitamins: E53.8

## 2019-06-15 NOTE — Assessment & Plan Note (Signed)
stable overall by history and exam, recent data reviewed with pt, and pt to continue medical treatment as before,  to f/u any worsening symptoms or concerns  

## 2019-06-15 NOTE — Assessment & Plan Note (Signed)
For b12 1000 mg IM replacement, then monthly to next visit

## 2019-06-20 DIAGNOSIS — M5136 Other intervertebral disc degeneration, lumbar region: Secondary | ICD-10-CM | POA: Diagnosis not present

## 2019-06-20 DIAGNOSIS — M9902 Segmental and somatic dysfunction of thoracic region: Secondary | ICD-10-CM | POA: Diagnosis not present

## 2019-06-20 DIAGNOSIS — M5135 Other intervertebral disc degeneration, thoracolumbar region: Secondary | ICD-10-CM | POA: Diagnosis not present

## 2019-06-20 DIAGNOSIS — M5137 Other intervertebral disc degeneration, lumbosacral region: Secondary | ICD-10-CM | POA: Diagnosis not present

## 2019-06-20 DIAGNOSIS — M9904 Segmental and somatic dysfunction of sacral region: Secondary | ICD-10-CM | POA: Diagnosis not present

## 2019-06-20 DIAGNOSIS — M9903 Segmental and somatic dysfunction of lumbar region: Secondary | ICD-10-CM | POA: Diagnosis not present

## 2019-06-24 DIAGNOSIS — M5136 Other intervertebral disc degeneration, lumbar region: Secondary | ICD-10-CM | POA: Diagnosis not present

## 2019-06-24 DIAGNOSIS — M9904 Segmental and somatic dysfunction of sacral region: Secondary | ICD-10-CM | POA: Diagnosis not present

## 2019-06-24 DIAGNOSIS — M9902 Segmental and somatic dysfunction of thoracic region: Secondary | ICD-10-CM | POA: Diagnosis not present

## 2019-06-24 DIAGNOSIS — M9903 Segmental and somatic dysfunction of lumbar region: Secondary | ICD-10-CM | POA: Diagnosis not present

## 2019-06-24 DIAGNOSIS — M5137 Other intervertebral disc degeneration, lumbosacral region: Secondary | ICD-10-CM | POA: Diagnosis not present

## 2019-06-24 DIAGNOSIS — M5135 Other intervertebral disc degeneration, thoracolumbar region: Secondary | ICD-10-CM | POA: Diagnosis not present

## 2019-06-27 DIAGNOSIS — M9902 Segmental and somatic dysfunction of thoracic region: Secondary | ICD-10-CM | POA: Diagnosis not present

## 2019-06-27 DIAGNOSIS — M5137 Other intervertebral disc degeneration, lumbosacral region: Secondary | ICD-10-CM | POA: Diagnosis not present

## 2019-06-27 DIAGNOSIS — M9903 Segmental and somatic dysfunction of lumbar region: Secondary | ICD-10-CM | POA: Diagnosis not present

## 2019-06-27 DIAGNOSIS — M5136 Other intervertebral disc degeneration, lumbar region: Secondary | ICD-10-CM | POA: Diagnosis not present

## 2019-06-27 DIAGNOSIS — M5135 Other intervertebral disc degeneration, thoracolumbar region: Secondary | ICD-10-CM | POA: Diagnosis not present

## 2019-06-27 DIAGNOSIS — M9904 Segmental and somatic dysfunction of sacral region: Secondary | ICD-10-CM | POA: Diagnosis not present

## 2019-07-04 DIAGNOSIS — M9904 Segmental and somatic dysfunction of sacral region: Secondary | ICD-10-CM | POA: Diagnosis not present

## 2019-07-04 DIAGNOSIS — M5135 Other intervertebral disc degeneration, thoracolumbar region: Secondary | ICD-10-CM | POA: Diagnosis not present

## 2019-07-04 DIAGNOSIS — M5136 Other intervertebral disc degeneration, lumbar region: Secondary | ICD-10-CM | POA: Diagnosis not present

## 2019-07-04 DIAGNOSIS — M9903 Segmental and somatic dysfunction of lumbar region: Secondary | ICD-10-CM | POA: Diagnosis not present

## 2019-07-04 DIAGNOSIS — M9902 Segmental and somatic dysfunction of thoracic region: Secondary | ICD-10-CM | POA: Diagnosis not present

## 2019-07-04 DIAGNOSIS — M5137 Other intervertebral disc degeneration, lumbosacral region: Secondary | ICD-10-CM | POA: Diagnosis not present

## 2019-07-17 DIAGNOSIS — H2513 Age-related nuclear cataract, bilateral: Secondary | ICD-10-CM | POA: Diagnosis not present

## 2019-07-22 DIAGNOSIS — M5135 Other intervertebral disc degeneration, thoracolumbar region: Secondary | ICD-10-CM | POA: Diagnosis not present

## 2019-07-22 DIAGNOSIS — M9903 Segmental and somatic dysfunction of lumbar region: Secondary | ICD-10-CM | POA: Diagnosis not present

## 2019-07-22 DIAGNOSIS — M9904 Segmental and somatic dysfunction of sacral region: Secondary | ICD-10-CM | POA: Diagnosis not present

## 2019-07-22 DIAGNOSIS — M5136 Other intervertebral disc degeneration, lumbar region: Secondary | ICD-10-CM | POA: Diagnosis not present

## 2019-07-22 DIAGNOSIS — M9902 Segmental and somatic dysfunction of thoracic region: Secondary | ICD-10-CM | POA: Diagnosis not present

## 2019-07-22 DIAGNOSIS — M5137 Other intervertebral disc degeneration, lumbosacral region: Secondary | ICD-10-CM | POA: Diagnosis not present

## 2019-08-05 ENCOUNTER — Other Ambulatory Visit: Payer: Self-pay

## 2019-08-05 ENCOUNTER — Encounter: Payer: Self-pay | Admitting: Cardiovascular Disease

## 2019-08-05 ENCOUNTER — Ambulatory Visit: Payer: Medicare HMO | Admitting: Cardiovascular Disease

## 2019-08-05 VITALS — BP 128/62 | HR 62 | Temp 96.8°F | Ht 77.0 in | Wt 238.4 lb

## 2019-08-05 DIAGNOSIS — G4733 Obstructive sleep apnea (adult) (pediatric): Secondary | ICD-10-CM

## 2019-08-05 DIAGNOSIS — I10 Essential (primary) hypertension: Secondary | ICD-10-CM | POA: Diagnosis not present

## 2019-08-05 DIAGNOSIS — I341 Nonrheumatic mitral (valve) prolapse: Secondary | ICD-10-CM | POA: Diagnosis not present

## 2019-08-05 DIAGNOSIS — E785 Hyperlipidemia, unspecified: Secondary | ICD-10-CM

## 2019-08-05 DIAGNOSIS — I480 Paroxysmal atrial fibrillation: Secondary | ICD-10-CM | POA: Diagnosis not present

## 2019-08-05 DIAGNOSIS — M9904 Segmental and somatic dysfunction of sacral region: Secondary | ICD-10-CM | POA: Diagnosis not present

## 2019-08-05 DIAGNOSIS — M9903 Segmental and somatic dysfunction of lumbar region: Secondary | ICD-10-CM | POA: Diagnosis not present

## 2019-08-05 DIAGNOSIS — M5135 Other intervertebral disc degeneration, thoracolumbar region: Secondary | ICD-10-CM | POA: Diagnosis not present

## 2019-08-05 DIAGNOSIS — M9902 Segmental and somatic dysfunction of thoracic region: Secondary | ICD-10-CM | POA: Diagnosis not present

## 2019-08-05 DIAGNOSIS — M5136 Other intervertebral disc degeneration, lumbar region: Secondary | ICD-10-CM | POA: Diagnosis not present

## 2019-08-05 DIAGNOSIS — M5137 Other intervertebral disc degeneration, lumbosacral region: Secondary | ICD-10-CM | POA: Diagnosis not present

## 2019-08-05 MED ORDER — LISINOPRIL 5 MG PO TABS: 7.5000 mg | ORAL_TABLET | Freq: Every day | ORAL | 0 refills | Status: DC

## 2019-08-05 NOTE — Patient Instructions (Signed)
Medication Instructions:  .increase Lisinopril to 7.5 mg (take 1.5 tablet) If you need a refill on your cardiac medications before your next appointment, please call your pharmacy.    Follow-Up: At [facility], you and your health needs are our priority.  As part of our continuing mission to provide you with exceptional heart care, we have created designated Provider Care Teams.  These Care Teams include your primary Cardiologist (physician) and Advanced Practice Providers (APPs -  Physician Assistants and Nurse Practitioners) who all work together to provide you with the care you need, when you need it. You will need a follow up appointment in 12 months.  Please call our office 2 months in advance to schedule this appointment.  You may see Dr.Kelly or one of the following Advanced Practice Providers on your designated Care Team: Almyra Deforest, [place] . Fabian Sharp, PA-C

## 2019-08-05 NOTE — Progress Notes (Signed)
Patient ID: Dustin Ferries., male   DOB: 02-05-1946, 73 y.o.   MRN: [id number]     HPI: Dustin Berry. is a 73 y.o. male who presents for an 99 month followup cardiology evaluation.     Dustin Berry has a long-standing history of paroxysmal atrial fibrillation. Initially in 2000 he was evaluated at [facility] and was treated at that time with flecainide. He subsequently developed breakthrough arrhythmia's and had done well with Rythmol SR and can, beta blocker. Remotely, he had seen Dustin Berry at [facility] as well as Dustin Berry at [facility] for consideration of atrial fibrillation ablation in 2011 at that time a decision not to have therapy. He had done well until recently but developed recurrent problems with recurrent atrial fibrillation despite medical therapy. He also has been diagnosed with obstructive sleep apnea and has been utilizing CPAP therapy with 100% compliance. He ultimately went back to [facility] and on June 6,2014 underwent pulmonary vein isolation of all pulmonary veins and radiofrequency ablation of CTI with bidirectional block noted with RA and CS pacing by Dustin Berry. He has been on eliquis 5 mg twice a day for anticoagulation. At that time he was taken off his multaq and metoprolol.  When I saw him in 2014 he started to notice white blood pressure elevation. He was unaware of any recurrent atrial fibrillation. He had been using his CPAP therapy. At that time, his blood pressure was 130/100. I elected to add low-dose lisinopril at 5 mg per day socially underwent a 2-D echo Doppler study on 11/21/2013. This showed an ejection fraction in the 45-50% range with grade 1 diastolic dysfunction. No definitive wall motion abnormalities were detected although the possibility was raised concerning possible mid inferior hypocontractility. He did have systolic bowing without definitive prolapse of his mitral valve with mild MR.    He underwent a one-year follow-up evaluation at [facility]  for his atrial fibrillation ablation.  He was maintaining sinus rhythm.  At that time, his anticoagulation was discontinued and he was resumed on baby aspirin for antiplatelet therapy.  He apparently has stopped using CPAP therapy over the past 3 years.  He denies any issues with his sleep.  He is unaware of any significant recurrent palpitations.  He has been monitoring his blood pressure at home and he states this is typically been running approximately 128 systolically.  He denies chest pain or palpitations.  He has back issues which has limited his exercise such that he predominantly just walks.  He no longer plays basketball.  When I last saw him in February 2019 he was in the process of selling his house, and he is building a new house.  In the interim he was notusing his CPAP and that this is in a box preparing for his move.  He also has moved his [place] center out of [employer] to the [place] region.  He is unaware of any recurrent atrial fibrillation.  Occasionally he admitted to palpitations which he senses particularly when he lies on his side.  He underwent a follow-up echo Doppler study in February 2018 which showed an EF of 50-55% with grade 1 diastolic dysfunction.  His aortic root was mildly dilated and measured 4.5 cm.  There was mitral valve prolapse involving the anterior leaflet and posterior leaflet with mild MR.  There was mild biatrial enlargement.  In follow-up of his aortic root increase he underwent CT angios of his chest and aorta which showed  a 4.3 cm a sending thoracic aortic aneurysm on 03/02/2017.  He was on lisinopril 5 mg for hypertension, atorvastatin 20 for hyperlipidemia.    I last saw him, he has remained stable from a cardiac perspective.  He is unaware of any recurrent arrhythmia.  He denies any chest pain.  Recently he has noticed his blood pressures slightly elevated and labile with typical blood pressures around 814 systolically.  He is unaware of any  breakthrough 820 fibrillation.  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  He underwent laboratory with Dustin Berry in February 2020.  Total cholesterol was 149, HDL 40.9, LDL 95, triglycerides 67.  He presents for evaluation.  Past Medical History:  Diagnosis Date  . Abdominal pain 04/01/2013  . ALLERGIC RHINITIS 05/12/2008  . Arthritis   . Atrial fibrillation ([place]) 03/15/2010   14 day event monitor - 1 episode of sinus bradycardia  . Atrial fibrillation with RVR ([place]) 10/21/2012   Echo- EF 50-55%; mild concentric LVH; flow pattern suggestive of impaired LV relaxation; mild mitral valve prolapse, trace mitral regurgitation  . B12 deficiency 06/15/2019  . Back pain    receiving PT  . Boil of buttock 06/24/11  . BPH (benign prostatic hypertrophy) 09/05/2012  . Cataract   . Chest pain with exertion presumed to be tachycardia related along with SOB 04/01/2013  . ESOPHAGEAL STRICTURE 03/24/2009  . Heart murmur   . Hematuria, possible 04/01/2013  . HTN (hypertension) 12/04/2013  . HYPERLIPIDEMIA 08/18/2007  . Insomnia 06/08/2016  . INSOMNIA-SLEEP DISORDER-UNSPEC 05/14/2009  . Mitral valve prolapse 07/04/2011  . MRSA (methicillin resistant staph aureus) culture positive 5+ years ago  . Neuromuscular disorder ([place])    peripheral neuropathy  . OSA (obstructive sleep apnea) 07/04/2011   sleep study - average AHI 2.5  . Palpitations 03/06/2007   R/P MV - mild perfusion defect in basal inferoseptal, basal inferior, mid inferoseptal, and mid inferior regions, consistent w/ infarct/scar; no scintigraphic evidence of inducible myocardial ischemia; prior non transmural infarct cannot be completely excluded; EF 48%; no significant change from previous study  . PERIPHERAL NEUROPATHY 05/12/2008  . Personal history of colonic polyps 05/12/2008  . Preventative health care 06/24/2011  . Sleep apnea   . 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 CARDIOVERSION N/A  04/03/2013   Procedure: CARDIOVERSION;  Surgeon: Pixie Casino, MD;  Location: [facility] ENDOSCOPY;  Service: Cardiovascular;  Laterality: N/A;  . COLONOSCOPY    . HAND SURGERY     Thumb joint repair  . POLYPECTOMY    . TEE WITHOUT CARDIOVERSION N/A 04/03/2013   Procedure: TRANSESOPHAGEAL ECHOCARDIOGRAM (TEE);  Surgeon: Pixie Casino, MD;  Location: [facility] ENDOSCOPY;  Service: Cardiovascular;  Laterality: N/A;  . TONSILLECTOMY AND ADENOIDECTOMY    . UPPER GASTROINTESTINAL ENDOSCOPY     dilation    Allergies  Allergen Reactions  . Amoxil [Amoxicillin] Rash    Current Outpatient Medications  Medication Sig Dispense Refill  . atorvastatin (LIPITOR) 40 MG tablet TAKE 1 TABLET BY MOUTH EVERY DAY 90 tablet 0  . lisinopril (ZESTRIL) 5 MG tablet Take 1.5 tablets (7.5 mg total) by mouth daily. 90 tablet 0  . metoprolol succinate (TOPROL-XL) 25 MG 24 hr tablet Take 1 tablet (25 mg total) by mouth daily. OFFICE VISIT NEEDED 90 tablet 0  . OVER THE COUNTER MEDICATION Magnesium Citrate tablets 200 mg. Daily.    Marland Kitchen zolpidem (AMBIEN) 5 MG tablet TAKE 1 TABLET (5  MG TOTAL) BY MOUTH AT BEDTIME AS NEEDED FOR SLEEP. 90 tablet 1   No current facility-administered medications for this visit.     Social History   Socioeconomic History  . Marital status: Married    Spouse name: Not on file  . Number of children: Not on file  . Years of education: Not on file  . Highest education level: Not on file  Occupational History  . 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 Financial resource strain: Not on file  . Food insecurity    Worry: Not on file    Inability: Not on file  . Transportation needs    Medical: Not on file    Non-medical: Not on file  Tobacco Use  . Smoking status: Never Smoker  . Smokeless tobacco: Never Used  Substance and Sexual Activity  . Alcohol use: Yes    Alcohol/week: 1.0 standard drinks    Types: 1 Glasses of wine per week  .  Drug use: No  . Sexual activity: Not on file  Lifestyle  . Physical activity    Days per week: Not on file    Minutes per session: Not on file  . Stress: Not on file  Relationships  . Social Herbalist on phone: Not on file    Gets together: Not on file    Attends religious service: Not on file    Active member of club or organization: Not on file    Attends meetings of clubs or organizations: Not on file    Relationship status: Not on file  . Intimate partner violence    Fear of current or ex partner: Not on file    Emotionally abused: Not on file    Physically abused: Not on file    Forced sexual activity: Not on file  Other Topics Concern  . Not on file  Social History Narrative  . Not on file    Family History  Problem Relation Age of Onset  . Heart disease Father 56       died with MI  . Cancer Sister        Melanoma  . Dementia Mother   . Stroke Maternal Grandfather   . Breast cancer Maternal Grandmother   . Colon cancer Neg Hx   . Esophageal cancer Neg Hx   . Stomach cancer Neg Hx   . Rectal cancer Neg Hx    Social history is notable in that he is married has 4 children. He never smoked cigarettes. He does try to exercise. Previously had played basketball. 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    ROS General: Negative; No fevers, chills, or night sweats;  HEENT: Negative; No changes in vision or hearing, sinus congestion, difficulty swallowing Pulmonary: Negative; No cough, wheezing, shortness of breath, hemoptysis Cardiovascular: See history of present illness GI: Negative; No nausea, vomiting, diarrhea, or abdominal pain GU: Negative; No dysuria, hematuria, or difficulty voiding Musculoskeletal: Negative; no myalgias, joint pain, or weakness Hematologic/Oncology: Negative; no easy bruising, bleeding Endocrine: Negative; no heat/cold intolerance; no diabetes Neuro: Negative; no changes in balance, headaches Skin: Negative; No  rashes or skin lesions Psychiatric: Negative; No behavioral problems, depression Sleep: Positive for obstructive sleep apnea treated with CPAP. No snoring, daytime sleepiness, hypersomnolence, bruxism, restless legs, hypnogognic hallucinations, no cataplexy Other comprehensive 14 point system review is negative.   PE BP 128/62   Pulse 62  Temp (!) 96.8 F (36 C)   Ht '6\' 5"'  (1.956 m)   Wt 238 lb 6.4 oz (108.1 kg)   BMI 28.27 kg/m    Repeat blood pressure by me 140/70  Wt Readings from Last 3 Encounters:  08/05/19 238 lb 6.4 oz (108.1 kg)  06/12/19 239 lb (108.4 kg)  12/13/18 230 lb (104.3 kg)   General: Alert, oriented, no distress.  Skin: normal turgor, no rashes, warm and dry HEENT: Normocephalic, atraumatic. Pupils equal round and reactive to light; sclera anicteric; extraocular muscles intact;  Nose without nasal septal hypertrophy Mouth/Parynx benign; Mallinpatti scale 3 Neck: No JVD, no carotid bruits; normal carotid upstroke Lungs: clear to ausculatation and percussion; no wheezing or rales Chest wall: without tenderness to palpitation Heart: PMI not displaced, RRR, s1 s2 normal, 1/6 systolic murmur, no diastolic murmur, no rubs, gallops, thrills, or heaves Abdomen: soft, nontender; no hepatosplenomehaly, BS+; abdominal aorta nontender and not dilated by palpation. Back: no CVA tenderness Pulses 2+ Musculoskeletal: full range of motion, normal strength, no joint deformities Extremities: no clubbing cyanosis or edema, Homan's sign negative.  Size 14 shoe Neurologic: grossly nonfocal; Cranial nerves grossly wnl Psychologic: Normal mood and affect    ECG (independently read by me): Normal sinus rhythm at 62 bpm.  Nonspecific T changes.  QTc interval normal at 414 ms.  February 2019 ECG (independently read by me): Normal sinus rhythm at 70 bpm.  Isolated PVC.  Nonspecific ST changes.  QTc interval 425 ms.  January 2018 ECG (independently read by me): Normal sinus  rhythm at 73 bpm.  PR interval 170 ms, QTc interval 423 ms.  Nonspecific ST-T changes.  December 2014 ECG: Normal sinus rhythm at 66 beats per minute. Nonspecific ST-T changes. QTc interval 429 ms.  LABS:  BMP Latest Ref Rng & Units 01/09/2019 02/02/2018 03/01/2017  Glucose 70 - 99 mg/dL 82 104(H) 93  BUN 6 - 23 mg/dL '12 17 18  ' Creatinine 0.40 - 1.50 mg/dL 1.04 1.08 1.13  BUN/Creat Ratio 10 - 24 - 16 -  Sodium 135 - 145 mEq/L 141 143 141  Potassium 3.5 - 5.1 mEq/L 4.2 5.0 4.6  Chloride 96 - 112 mEq/L 106 108(H) 111(H)  CO2 19 - 32 mEq/L '26 23 23  ' Calcium 8.4 - 10.5 mg/dL 9.6 9.2 9.3   Hepatic Function Latest Ref Rng & Units 01/09/2019 02/02/2018 01/12/2017  Total Protein 6.0 - 8.3 g/dL 7.0 6.9 6.9  Albumin 3.5 - 5.2 g/dL 4.3 4.3 4.0  AST 0 - 37 U/L '16 19 18  ' ALT 0 - 53 U/L '16 22 18  ' Alk Phosphatase 39 - 117 U/L 66 63 59  Total Bilirubin 0.2 - 1.2 mg/dL 0.9 0.4 0.5  Bilirubin, Direct 0.0 - 0.3 mg/dL 0.2 - -   CBC Latest Ref Rng & Units 01/09/2019 02/02/2018 01/12/2017  WBC 4.0 - 10.5 K/uL 4.7 3.8 4.4  Hemoglobin 13.0 - 17.0 g/dL 15.5 15.4 15.5  Hematocrit 39.0 - 52.0 % 45.8 45.2 46.5  Platelets 150.0 - 400.0 K/uL 249.0 227 203   Lab Results  Component Value Date   MCV 89.0 01/09/2019   MCV 89 02/02/2018   MCV 88.9 01/12/2017   Lab Results  Component Value Date   TSH 3.23 01/09/2019   Lipid Panel     Component Value Date/Time   CHOL 149 01/09/2019 1331   CHOL 170 02/02/2018 0941   TRIG 67.0 01/09/2019 1331   HDL 40.90 01/09/2019 1331   HDL 38 (L) 02/02/2018 0941  CHOLHDL 4 01/09/2019 1331   VLDL 13.4 01/09/2019 1331   LDLCALC 95 01/09/2019 1331   LDLCALC 115 (H) 02/02/2018 0941    RADIOLOGY: No results found.  IMPRESSION:  1. Mitral valve prolapse   2. PAF (paroxysmal atrial fibrillation) [facility]): s/p ablation June 2014   3. Essential hypertension   4. Hyperlipidemia, unspecified hyperlipidemia type   5. OSA (obstructive sleep apnea)     ASSESSMENT AND PLAN: Dustin Berry  is a 73 year-old Caucasian male who is a history of paroxysmal atrial fibrillation dating back to 2000 when he was initiated on therapy at [facility].  He underwent successful radiofrequency AF ablation with pulmonary vein isolation in June 2014 by Dr. Norm Salt at [facility]. He is maintaining sinus rhythm.  He had been on eliquis anticoagulation and this was discontinued by his [place] and now he is just on aspirin 81 mg.  His echo Doppler study in  2014 showed an ejection fraction of 45-50%.  The last echo of February 2018  showed an EF of 70-96%, grade 1 diastolic dysfunction, mitral valve prolapse involving both leaflets with mild MR, mild biatrial enlargement, and mild dilation of his ascending aorta at 4.5 cm.  A CT angio of his chest showed a 4.3 cm descending thoracic aortic aneurysm.  He is remains asymptomatic without chest pain.  Recently he has noticed some mild blood pressure lability with often blood pressures being in the 438 systolic range.  I reviewed with him most recent hypertensive guidelines.  He has been on lisinopril 5 mg daily in addition to Toprol-XL 25 mg.  I am recommending slight titration of lisinopril to 7.5 mg for more optimal blood pressure control with target blood pressure less than 130/80.  He continues to be on atorvastatin 40 mg daily for hyperlipidemia.  He has obstructive sleep apnea and has been on CPAP therapy.  Not having any chest pain or anginal symptomatology.  Renal function is stable with a creatinine of 1.04 in February 2020.  As long as he is stable I will see him in 1 year for reevaluation or sooner problems arise.    Time spent: 25 minutes  Troy Sine, MD, [facility]  08/07/2019 9:21 AM

## 2019-08-07 ENCOUNTER — Encounter: Payer: Self-pay | Admitting: Cardiovascular Disease

## 2019-08-16 NOTE — Addendum Note (Signed)
Addended by: Therisa Doyne on: 08/16/2019 11:36 AM   Modules accepted: Orders

## 2019-08-19 DIAGNOSIS — H43392 Other vitreous opacities, left eye: Secondary | ICD-10-CM | POA: Diagnosis not present

## 2019-08-19 DIAGNOSIS — H25013 Cortical age-related cataract, bilateral: Secondary | ICD-10-CM | POA: Diagnosis not present

## 2019-08-19 DIAGNOSIS — H2513 Age-related nuclear cataract, bilateral: Secondary | ICD-10-CM | POA: Diagnosis not present

## 2019-08-19 DIAGNOSIS — M5136 Other intervertebral disc degeneration, lumbar region: Secondary | ICD-10-CM | POA: Diagnosis not present

## 2019-08-19 DIAGNOSIS — H2511 Age-related nuclear cataract, right eye: Secondary | ICD-10-CM | POA: Diagnosis not present

## 2019-08-19 DIAGNOSIS — M9904 Segmental and somatic dysfunction of sacral region: Secondary | ICD-10-CM | POA: Diagnosis not present

## 2019-08-19 DIAGNOSIS — M5135 Other intervertebral disc degeneration, thoracolumbar region: Secondary | ICD-10-CM | POA: Diagnosis not present

## 2019-08-19 DIAGNOSIS — M5137 Other intervertebral disc degeneration, lumbosacral region: Secondary | ICD-10-CM | POA: Diagnosis not present

## 2019-08-19 DIAGNOSIS — M9903 Segmental and somatic dysfunction of lumbar region: Secondary | ICD-10-CM | POA: Diagnosis not present

## 2019-08-19 DIAGNOSIS — M9902 Segmental and somatic dysfunction of thoracic region: Secondary | ICD-10-CM | POA: Diagnosis not present

## 2019-08-19 DIAGNOSIS — H25043 Posterior subcapsular polar age-related cataract, bilateral: Secondary | ICD-10-CM | POA: Diagnosis not present

## 2019-09-01 ENCOUNTER — Other Ambulatory Visit: Payer: Self-pay | Admitting: Internal Medicine

## 2019-09-02 DIAGNOSIS — R69 Illness, unspecified: Secondary | ICD-10-CM | POA: Diagnosis not present

## 2019-09-10 DIAGNOSIS — H2511 Age-related nuclear cataract, right eye: Secondary | ICD-10-CM | POA: Diagnosis not present

## 2019-09-10 DIAGNOSIS — H25811 Combined forms of age-related cataract, right eye: Secondary | ICD-10-CM | POA: Diagnosis not present

## 2019-09-11 DIAGNOSIS — H25042 Posterior subcapsular polar age-related cataract, left eye: Secondary | ICD-10-CM | POA: Diagnosis not present

## 2019-09-11 DIAGNOSIS — H2512 Age-related nuclear cataract, left eye: Secondary | ICD-10-CM | POA: Diagnosis not present

## 2019-09-11 DIAGNOSIS — H25012 Cortical age-related cataract, left eye: Secondary | ICD-10-CM | POA: Diagnosis not present

## 2019-09-17 DIAGNOSIS — H25812 Combined forms of age-related cataract, left eye: Secondary | ICD-10-CM | POA: Diagnosis not present

## 2019-09-17 DIAGNOSIS — H2512 Age-related nuclear cataract, left eye: Secondary | ICD-10-CM | POA: Diagnosis not present

## 2019-09-29 ENCOUNTER — Other Ambulatory Visit: Payer: Self-pay | Admitting: Cardiovascular Disease

## 2019-10-14 DIAGNOSIS — H2512 Age-related nuclear cataract, left eye: Secondary | ICD-10-CM | POA: Diagnosis not present

## 2019-11-24 ENCOUNTER — Other Ambulatory Visit: Payer: Self-pay | Admitting: Internal Medicine

## 2019-11-25 ENCOUNTER — Telehealth: Payer: Self-pay | Admitting: Cardiovascular Disease

## 2019-11-25 NOTE — Telephone Encounter (Signed)
Spoke with pt who report for the past few weeks his BP has been elevated. He report for about 2 weeks it was running around 140's and for the past week in the 150's. He denies any symptoms but wanted to make MD aware.

## 2019-11-25 NOTE — Telephone Encounter (Signed)
Pt c/o BP issue: STAT if pt c/o blurred vision, one-sided weakness or slurred speech  1. What are your last 5 BP readings?  155/95, 159/98  2. Are you having any other symptoms (ex. Dizziness, headache, blurred vision, passed out)? no  3. What is your BP issue? BP is elevated. Patient would like to talk with a nurse.

## 2019-12-01 NOTE — Telephone Encounter (Signed)
Increase lisinopril from 7.5 mg to 10 mg daily; monitor BP

## 2019-12-02 NOTE — Telephone Encounter (Signed)
Left message to call back  

## 2019-12-03 NOTE — Telephone Encounter (Signed)
Patient called with MD recommendations on BP med. He will take 2 - 5mg  tablets of lisinopril and does not need new Rx right now.

## 2019-12-04 ENCOUNTER — Other Ambulatory Visit: Payer: Self-pay | Admitting: Cardiovascular Disease

## 2019-12-04 NOTE — Telephone Encounter (Signed)
Rx(s) sent to pharmacy electronically.  

## 2019-12-05 ENCOUNTER — Other Ambulatory Visit: Payer: Self-pay | Admitting: Cardiovascular Disease

## 2019-12-05 NOTE — Telephone Encounter (Signed)
Rx request sent to pharmacy.  

## 2019-12-19 ENCOUNTER — Other Ambulatory Visit: Payer: Self-pay | Admitting: Internal Medicine

## 2020-01-15 DIAGNOSIS — Z85828 Personal history of other malignant neoplasm of skin: Secondary | ICD-10-CM | POA: Diagnosis not present

## 2020-01-15 DIAGNOSIS — D485 Neoplasm of uncertain behavior of skin: Secondary | ICD-10-CM | POA: Diagnosis not present

## 2020-01-15 DIAGNOSIS — D2272 Melanocytic nevi of left lower limb, including hip: Secondary | ICD-10-CM | POA: Diagnosis not present

## 2020-01-15 DIAGNOSIS — L738 Other specified follicular disorders: Secondary | ICD-10-CM | POA: Diagnosis not present

## 2020-01-15 DIAGNOSIS — D225 Melanocytic nevi of trunk: Secondary | ICD-10-CM | POA: Diagnosis not present

## 2020-01-15 DIAGNOSIS — L821 Other seborrheic keratosis: Secondary | ICD-10-CM | POA: Diagnosis not present

## 2020-01-15 DIAGNOSIS — D2271 Melanocytic nevi of right lower limb, including hip: Secondary | ICD-10-CM | POA: Diagnosis not present

## 2020-01-15 DIAGNOSIS — L918 Other hypertrophic disorders of the skin: Secondary | ICD-10-CM | POA: Diagnosis not present

## 2020-01-15 DIAGNOSIS — D2261 Melanocytic nevi of right upper limb, including shoulder: Secondary | ICD-10-CM | POA: Diagnosis not present

## 2020-01-29 DIAGNOSIS — M5137 Other intervertebral disc degeneration, lumbosacral region: Secondary | ICD-10-CM | POA: Diagnosis not present

## 2020-01-29 DIAGNOSIS — M5135 Other intervertebral disc degeneration, thoracolumbar region: Secondary | ICD-10-CM | POA: Diagnosis not present

## 2020-01-29 DIAGNOSIS — M9904 Segmental and somatic dysfunction of sacral region: Secondary | ICD-10-CM | POA: Diagnosis not present

## 2020-01-29 DIAGNOSIS — M9902 Segmental and somatic dysfunction of thoracic region: Secondary | ICD-10-CM | POA: Diagnosis not present

## 2020-01-29 DIAGNOSIS — M9903 Segmental and somatic dysfunction of lumbar region: Secondary | ICD-10-CM | POA: Diagnosis not present

## 2020-01-29 DIAGNOSIS — M5136 Other intervertebral disc degeneration, lumbar region: Secondary | ICD-10-CM | POA: Diagnosis not present

## 2020-02-04 DIAGNOSIS — M79641 Pain in right hand: Secondary | ICD-10-CM

## 2020-02-04 HISTORY — DX: Pain in right hand: M79.641

## 2020-02-05 DIAGNOSIS — M1811 Unilateral primary osteoarthritis of first carpometacarpal joint, right hand: Secondary | ICD-10-CM | POA: Diagnosis not present

## 2020-02-05 DIAGNOSIS — M79641 Pain in right hand: Secondary | ICD-10-CM | POA: Diagnosis not present

## 2020-02-05 DIAGNOSIS — M79642 Pain in left hand: Secondary | ICD-10-CM | POA: Diagnosis not present

## 2020-02-07 DIAGNOSIS — M5135 Other intervertebral disc degeneration, thoracolumbar region: Secondary | ICD-10-CM | POA: Diagnosis not present

## 2020-02-07 DIAGNOSIS — M5136 Other intervertebral disc degeneration, lumbar region: Secondary | ICD-10-CM | POA: Diagnosis not present

## 2020-02-07 DIAGNOSIS — M9904 Segmental and somatic dysfunction of sacral region: Secondary | ICD-10-CM | POA: Diagnosis not present

## 2020-02-07 DIAGNOSIS — M5137 Other intervertebral disc degeneration, lumbosacral region: Secondary | ICD-10-CM | POA: Diagnosis not present

## 2020-02-07 DIAGNOSIS — M9902 Segmental and somatic dysfunction of thoracic region: Secondary | ICD-10-CM | POA: Diagnosis not present

## 2020-02-07 DIAGNOSIS — M9903 Segmental and somatic dysfunction of lumbar region: Secondary | ICD-10-CM | POA: Diagnosis not present

## 2020-02-17 DIAGNOSIS — M5136 Other intervertebral disc degeneration, lumbar region: Secondary | ICD-10-CM | POA: Diagnosis not present

## 2020-02-17 DIAGNOSIS — M5137 Other intervertebral disc degeneration, lumbosacral region: Secondary | ICD-10-CM | POA: Diagnosis not present

## 2020-02-17 DIAGNOSIS — M9902 Segmental and somatic dysfunction of thoracic region: Secondary | ICD-10-CM | POA: Diagnosis not present

## 2020-02-17 DIAGNOSIS — M9904 Segmental and somatic dysfunction of sacral region: Secondary | ICD-10-CM | POA: Diagnosis not present

## 2020-02-17 DIAGNOSIS — M9903 Segmental and somatic dysfunction of lumbar region: Secondary | ICD-10-CM | POA: Diagnosis not present

## 2020-02-17 DIAGNOSIS — M5135 Other intervertebral disc degeneration, thoracolumbar region: Secondary | ICD-10-CM | POA: Diagnosis not present

## 2020-03-09 DIAGNOSIS — M5136 Other intervertebral disc degeneration, lumbar region: Secondary | ICD-10-CM | POA: Diagnosis not present

## 2020-03-09 DIAGNOSIS — M5137 Other intervertebral disc degeneration, lumbosacral region: Secondary | ICD-10-CM | POA: Diagnosis not present

## 2020-03-09 DIAGNOSIS — M9904 Segmental and somatic dysfunction of sacral region: Secondary | ICD-10-CM | POA: Diagnosis not present

## 2020-03-09 DIAGNOSIS — M9902 Segmental and somatic dysfunction of thoracic region: Secondary | ICD-10-CM | POA: Diagnosis not present

## 2020-03-09 DIAGNOSIS — M5135 Other intervertebral disc degeneration, thoracolumbar region: Secondary | ICD-10-CM | POA: Diagnosis not present

## 2020-03-09 DIAGNOSIS — M9903 Segmental and somatic dysfunction of lumbar region: Secondary | ICD-10-CM | POA: Diagnosis not present

## 2020-04-06 DIAGNOSIS — Z0100 Encounter for examination of eyes and vision without abnormal findings: Secondary | ICD-10-CM | POA: Diagnosis not present

## 2020-04-06 DIAGNOSIS — H43811 Vitreous degeneration, right eye: Secondary | ICD-10-CM | POA: Diagnosis not present

## 2020-05-06 ENCOUNTER — Encounter: Payer: Self-pay | Admitting: Gastroenterology

## 2020-05-26 DIAGNOSIS — M1811 Unilateral primary osteoarthritis of first carpometacarpal joint, right hand: Secondary | ICD-10-CM | POA: Diagnosis not present

## 2020-05-26 DIAGNOSIS — M13131 Monoarthritis, not elsewhere classified, right wrist: Secondary | ICD-10-CM | POA: Diagnosis not present

## 2020-05-26 DIAGNOSIS — G8918 Other acute postprocedural pain: Secondary | ICD-10-CM | POA: Diagnosis not present

## 2020-06-02 ENCOUNTER — Other Ambulatory Visit: Payer: Self-pay | Admitting: Cardiovascular Disease

## 2020-06-10 DIAGNOSIS — Z4789 Encounter for other orthopedic aftercare: Secondary | ICD-10-CM | POA: Diagnosis not present

## 2020-06-10 DIAGNOSIS — M1811 Unilateral primary osteoarthritis of first carpometacarpal joint, right hand: Secondary | ICD-10-CM | POA: Diagnosis not present

## 2020-06-11 ENCOUNTER — Encounter: Payer: Self-pay | Admitting: Internal Medicine

## 2020-06-11 ENCOUNTER — Other Ambulatory Visit: Payer: Self-pay

## 2020-06-11 ENCOUNTER — Ambulatory Visit (INDEPENDENT_AMBULATORY_CARE_PROVIDER_SITE_OTHER): Payer: Medicare HMO | Admitting: Internal Medicine

## 2020-06-11 VITALS — BP 110/80 | HR 50 | Temp 98.7°F | Ht 77.0 in | Wt 229.0 lb

## 2020-06-11 DIAGNOSIS — I10 Essential (primary) hypertension: Secondary | ICD-10-CM

## 2020-06-11 DIAGNOSIS — H6092 Unspecified otitis externa, left ear: Secondary | ICD-10-CM | POA: Diagnosis not present

## 2020-06-11 DIAGNOSIS — J309 Allergic rhinitis, unspecified: Secondary | ICD-10-CM | POA: Diagnosis not present

## 2020-06-11 HISTORY — DX: Unspecified otitis externa, left ear: H60.92

## 2020-06-11 MED ORDER — NEOMYCIN-POLYMYXIN-HC 3.5-10000-1 OT SUSP: 3.0000 [drp] | Freq: Four times a day (QID) | OTIC | 0 refills | Status: AC

## 2020-06-11 NOTE — Patient Instructions (Signed)
Please take all new medication as prescribed - the ear drops  Please continue all other medications as before, including the sulfa antibiotic  Please have the pharmacy call with any other refills you may need.  Please continue your efforts at being more active, low cholesterol diet, and weight control.  Please keep your appointments with your specialists as you may have planned

## 2020-06-11 NOTE — Progress Notes (Signed)
Subjective:    Patient ID: Dustin Berry., male    DOB: 07-04-1946, 74 y.o.   MRN: [id number]  HPI  Here with 3 days onset left ear pain gradually worsening now severe, constant, sore to touch at time and worse to lie on left side, nothing really makes it better, no fever, chills, appetite ok , no recent wt loss.  Has had mild nasal allergies in the spring but no worsening he is aware.  Has some ongoing hearing loss with hearing aids, only has right one today.  No left drainage + hx of mrsa boils in past, getting intermttetn with septra per Dr Ronnald Ramp dermatology, now on day 5 /10 for septra again.   Did have right wrist/thumb surgury 2 wks ago now casted, with plan to f/u every 2 wks.  Pt denies chest pain, increased sob or doe, wheezing, orthopnea, PND, increased LE swelling, palpitations, dizziness or syncope.  Pt denies new neurological symptoms such as new headache, or facial or extremity weakness or numbness  Pt denies polydipsia, polyuria, Does have several wks ongoing nasal allergy symptoms with clearish congestion, itch and sneezing, without fever, pain, ST, cough, swelling or wheezing Past Medical History:  Diagnosis Date  . Abdominal pain 04/01/2013  . ALLERGIC RHINITIS 05/12/2008  . Arthritis   . Atrial fibrillation ([place]) 03/15/2010   14 day event monitor - 1 episode of sinus bradycardia  . Atrial fibrillation with RVR ([place]) 10/21/2012   Echo- EF 50-55%; mild concentric LVH; flow pattern suggestive of impaired LV relaxation; mild mitral valve prolapse, trace mitral regurgitation  . B12 deficiency 06/15/2019  . Back pain    receiving PT  . Boil of buttock 06/24/11  . BPH (benign prostatic hypertrophy) 09/05/2012  . Cataract   . Chest pain with exertion presumed to be tachycardia related along with SOB 04/01/2013  . ESOPHAGEAL STRICTURE 03/24/2009  . Heart murmur   . Hematuria, possible 04/01/2013  . HTN (hypertension) 12/04/2013  . HYPERLIPIDEMIA 08/18/2007  . Insomnia 06/08/2016  .  INSOMNIA-SLEEP DISORDER-UNSPEC 05/14/2009  . Mitral valve prolapse 07/04/2011  . MRSA (methicillin resistant staph aureus) culture positive 5+ years ago  . Neuromuscular disorder ([place])    peripheral neuropathy  . OSA (obstructive sleep apnea) 07/04/2011   sleep study - average AHI 2.5  . Palpitations 03/06/2007   R/P MV - mild perfusion defect in basal inferoseptal, basal inferior, mid inferoseptal, and mid inferior regions, consistent w/ infarct/scar; no scintigraphic evidence of inducible myocardial ischemia; prior non transmural infarct cannot be completely excluded; EF 48%; no significant change from previous study  . PERIPHERAL NEUROPATHY 05/12/2008  . Personal history of colonic polyps 05/12/2008  . Preventative health care 06/24/2011  . Sleep apnea   . 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 CARDIOVERSION N/A 04/03/2013   Procedure: CARDIOVERSION;  Surgeon: Pixie Casino, MD;  Location: [facility] ENDOSCOPY;  Service: Cardiovascular;  Laterality: N/A;  . COLONOSCOPY    . HAND SURGERY     Thumb joint repair  . POLYPECTOMY    . TEE WITHOUT CARDIOVERSION N/A 04/03/2013   Procedure: TRANSESOPHAGEAL ECHOCARDIOGRAM (TEE);  Surgeon: Pixie Casino, MD;  Location: [facility] ENDOSCOPY;  Service: Cardiovascular;  Laterality: N/A;  . TONSILLECTOMY AND ADENOIDECTOMY    . UPPER GASTROINTESTINAL ENDOSCOPY     dilation    reports that he has never smoked. He has never used smokeless tobacco. He reports current alcohol use of about 1.0 standard drink  of alcohol per week. He reports that he does not use drugs. family history includes Breast cancer in his maternal grandmother; Dementia in his mother; Heart disease (age of onset: 11) in his father; Melanoma in his sister; Stroke in his maternal grandfather. Allergies  Allergen Reactions  . Amoxil [Amoxicillin] Rash   Current Outpatient Medications on File Prior to Visit  Medication Sig Dispense Refill  .  atorvastatin (LIPITOR) 40 MG tablet Take 1 tablet (40 mg total) by mouth daily. Annual appt due January must see provider for future refills 30 tablet 0  . lisinopril (ZESTRIL) 5 MG tablet TAKE 1.5 TABLETS (7.5 MG TOTAL) BY MOUTH DAILY. 135 tablet 2  . meloxicam (MOBIC) 15 MG tablet Take 15 mg by mouth daily.    . metoprolol succinate (TOPROL-XL) 25 MG 24 hr tablet Take 1 tablet (25 mg total) by mouth daily. 90 tablet 2  . OVER THE COUNTER MEDICATION Magnesium Citrate tablets 200 mg. Daily.    Marland Kitchen sulfamethoxazole-trimethoprim (BACTRIM DS) 800-160 MG tablet sulfamethoxazole 800 mg-trimethoprim 160 mg tablet  TAKE 1 TABLET BY MOUTH TWICE A DAY FOR 10 DAYS    . tiZANidine (ZANAFLEX) 2 MG tablet tizanidine 2 mg tablet  TAKE 1 TABLET BY MOUTH EVERY 6 HOURS AS NEEDED FOR MUSCLE SPASMS    . traMADol (ULTRAM) 50 MG tablet tramadol 50 mg tablet  TAKE 1 TABLET BY MOUTH EVERY 6 HOURS AS NEEDED    . zolpidem (AMBIEN) 5 MG tablet TAKE 1 TABLET (5 MG TOTAL) BY MOUTH AT BEDTIME AS NEEDED FOR SLEEP. 90 tablet 1   No current facility-administered medications on file prior to visit.   Review of Systems All otherwise neg per pt     Objective:   Physical Exam BP 110/80 (BP Location: Left Arm, Patient Position: Sitting, Cuff Size: Large)   Pulse (!) 50   Temp 98.7 F (37.1 C) (Oral)   Ht 6\' 5"  (5.726 m)   Wt 229 lb (103.9 kg)   SpO2 97%   BMI 27.16 kg/m   VS noted,  Constitutional: Pt appears in NAD HENT: Head: NCAT.  Right Ear: External ear normal.  Left Ear: External ear normal. But with 1+ red, tender swelling canal without d/c  Eyes: . Pupils are equal, round, and reactive to light. Conjunctivae and EOM are normal Nose: without d/c or deformity Neck: Neck supple. Gross normal ROM Cardiovascular: Normal rate and regular rhythm.   Pulmonary/Chest: Effort normal and breath sounds without rales or wheezing.  Neurological: Pt is alert. At baseline orientation, motor grossly intact Skin: Skin is  warm. No rashes, other new lesions, no LE edema Psychiatric: Pt behavior is normal without agitation  All otherwise neg per pt Lab Results  Component Value Date   WBC 4.7 01/09/2019   HGB 15.5 01/09/2019   HCT 45.8 01/09/2019   PLT 249.0 01/09/2019   GLUCOSE 82 01/09/2019   CHOL 149 01/09/2019   TRIG 67.0 01/09/2019   HDL 40.90 01/09/2019   LDLCALC 95 01/09/2019   ALT 16 01/09/2019   AST 16 01/09/2019   NA 141 01/09/2019   K 4.2 01/09/2019   CL 106 01/09/2019   CREATININE 1.04 01/09/2019   BUN 12 01/09/2019   CO2 26 01/09/2019   TSH 3.23 01/09/2019   PSA 0.90 01/09/2019   INR 1.19 04/01/2013       Assessment & Plan:

## 2020-06-15 ENCOUNTER — Encounter: Payer: Self-pay | Admitting: Internal Medicine

## 2020-06-15 ENCOUNTER — Telehealth: Payer: Self-pay

## 2020-06-15 DIAGNOSIS — H9209 Otalgia, unspecified ear: Secondary | ICD-10-CM

## 2020-06-15 NOTE — Assessment & Plan Note (Signed)
stable overall by history and exam, recent data reviewed with pt, and pt to continue medical treatment as before,  to f/u any worsening symptoms or concerns  

## 2020-06-15 NOTE — Assessment & Plan Note (Addendum)
Mild to mod, for antibx course,  to f/u any worsening symptoms or concerns  I spent 31 minutes in preparing to see the patient by review of recent labs, imaging and procedures, obtaining and reviewing separately obtained history, communicating with the patient and family or caregiver, ordering medications, tests or procedures, and documenting clinical information in the EHR including the differential Dx, treatment, and any further evaluation and other management of ext otitis left ear, allergies, and htn

## 2020-06-15 NOTE — Telephone Encounter (Signed)
Sent to Dr. John. 

## 2020-06-15 NOTE — Telephone Encounter (Signed)
New message    Referral to ENT   Dr. Redmond Baseman & Wil[facility] Cornelia   Reason : severe infection in left ear

## 2020-06-15 NOTE — Assessment & Plan Note (Signed)
.  ok for otc allegra and nasacort

## 2020-06-15 NOTE — Telephone Encounter (Signed)
Ok referral done 

## 2020-06-24 DIAGNOSIS — M79641 Pain in right hand: Secondary | ICD-10-CM | POA: Diagnosis not present

## 2020-06-24 DIAGNOSIS — M1811 Unilateral primary osteoarthritis of first carpometacarpal joint, right hand: Secondary | ICD-10-CM | POA: Diagnosis not present

## 2020-06-24 DIAGNOSIS — Z4789 Encounter for other orthopedic aftercare: Secondary | ICD-10-CM | POA: Diagnosis not present

## 2020-07-06 DIAGNOSIS — M1811 Unilateral primary osteoarthritis of first carpometacarpal joint, right hand: Secondary | ICD-10-CM | POA: Diagnosis not present

## 2020-07-06 DIAGNOSIS — M79644 Pain in right finger(s): Secondary | ICD-10-CM | POA: Diagnosis not present

## 2020-07-06 DIAGNOSIS — Z4789 Encounter for other orthopedic aftercare: Secondary | ICD-10-CM | POA: Diagnosis not present

## 2020-07-08 DIAGNOSIS — M79644 Pain in right finger(s): Secondary | ICD-10-CM | POA: Diagnosis not present

## 2020-07-13 DIAGNOSIS — M79644 Pain in right finger(s): Secondary | ICD-10-CM | POA: Diagnosis not present

## 2020-07-20 ENCOUNTER — Encounter: Payer: Self-pay | Admitting: Gastroenterology

## 2020-07-20 ENCOUNTER — Encounter: Payer: Medicare HMO | Admitting: Internal Medicine

## 2020-07-20 DIAGNOSIS — M79644 Pain in right finger(s): Secondary | ICD-10-CM | POA: Diagnosis not present

## 2020-07-27 DIAGNOSIS — M79644 Pain in right finger(s): Secondary | ICD-10-CM | POA: Diagnosis not present

## 2020-07-29 DIAGNOSIS — Z4789 Encounter for other orthopedic aftercare: Secondary | ICD-10-CM | POA: Diagnosis not present

## 2020-07-29 DIAGNOSIS — M1811 Unilateral primary osteoarthritis of first carpometacarpal joint, right hand: Secondary | ICD-10-CM | POA: Diagnosis not present

## 2020-07-29 DIAGNOSIS — M79644 Pain in right finger(s): Secondary | ICD-10-CM | POA: Diagnosis not present

## 2020-08-03 DIAGNOSIS — M79644 Pain in right finger(s): Secondary | ICD-10-CM | POA: Diagnosis not present

## 2020-08-10 ENCOUNTER — Other Ambulatory Visit: Payer: Self-pay | Admitting: Cardiovascular Disease

## 2020-08-12 ENCOUNTER — Ambulatory Visit (INDEPENDENT_AMBULATORY_CARE_PROVIDER_SITE_OTHER): Payer: Medicare HMO | Admitting: Internal Medicine

## 2020-08-12 ENCOUNTER — Other Ambulatory Visit: Payer: Self-pay

## 2020-08-12 ENCOUNTER — Encounter: Payer: Self-pay | Admitting: Internal Medicine

## 2020-08-12 VITALS — BP 128/100 | HR 73 | Temp 98.5°F | Ht 77.0 in | Wt 233.0 lb

## 2020-08-12 DIAGNOSIS — Z0000 Encounter for general adult medical examination without abnormal findings: Secondary | ICD-10-CM

## 2020-08-12 DIAGNOSIS — I700 Atherosclerosis of aorta: Secondary | ICD-10-CM | POA: Diagnosis not present

## 2020-08-12 DIAGNOSIS — N400 Enlarged prostate without lower urinary tract symptoms: Secondary | ICD-10-CM

## 2020-08-12 DIAGNOSIS — E538 Deficiency of other specified B group vitamins: Secondary | ICD-10-CM

## 2020-08-12 DIAGNOSIS — I10 Essential (primary) hypertension: Secondary | ICD-10-CM

## 2020-08-12 NOTE — Patient Instructions (Signed)

## 2020-08-12 NOTE — Progress Notes (Signed)
Subjective:    Patient ID: Dustin Ferries., male    DOB: [birth year], 74 y.o.   MRN: [id number]  HPI  Here for wellness and f/u;  Overall doing ok;  Pt denies Chest pain, worsening SOB, DOE, wheezing, orthopnea, PND, worsening LE edema, palpitations, dizziness or syncope.  Pt denies neurological change such as new headache, facial or extremity weakness.  Pt denies polydipsia, polyuria, or low sugar symptoms. Pt states overall good compliance with treatment and medications, good tolerability, and has been trying to follow appropriate diet.  Pt denies worsening depressive symptoms, suicidal ideation or panic. No fever, night sweats, wt loss, loss of appetite, or other constitutional symptoms.  Pt states good ability with ADL's, has low fall risk, home safety reviewed and adequate, no other significant changes in hearing or vision, and only occasionally active with exercise.  [facility]sa colonoscopy sched next month.  BP at home < 140/90.  Also with mild urinary sympomts, did not tolerate flomax Past Medical History:  Diagnosis Date  . Abdominal pain 04/01/2013  . ALLERGIC RHINITIS 05/12/2008  . Arthritis   . Atrial fibrillation ([place]) 03/15/2010   14 day event monitor - 1 episode of sinus bradycardia  . Atrial fibrillation with RVR ([place]) 10/21/2012   Echo- EF 50-55%; mild concentric LVH; flow pattern suggestive of impaired LV relaxation; mild mitral valve prolapse, trace mitral regurgitation  . B12 deficiency 06/15/2019  . Back pain    receiving PT  . Boil of buttock 06/24/11  . BPH (benign prostatic hypertrophy) 09/05/2012  . Cataract   . Chest pain with exertion presumed to be tachycardia related along with SOB 04/01/2013  . ESOPHAGEAL STRICTURE 03/24/2009  . Heart murmur   . Hematuria, possible 04/01/2013  . HTN (hypertension) 12/04/2013  . HYPERLIPIDEMIA 08/18/2007  . Insomnia 06/08/2016  . INSOMNIA-SLEEP DISORDER-UNSPEC 05/14/2009  . Mitral valve prolapse 07/04/2011  . MRSA (methicillin resistant  staph aureus) culture positive 5+ years ago  . Neuromuscular disorder ([place])    peripheral neuropathy  . OSA (obstructive sleep apnea) 07/04/2011   sleep study - average AHI 2.5  . Palpitations 03/06/2007   R/P MV - mild perfusion defect in basal inferoseptal, basal inferior, mid inferoseptal, and mid inferior regions, consistent w/ infarct/scar; no scintigraphic evidence of inducible myocardial ischemia; prior non transmural infarct cannot be completely excluded; EF 48%; no significant change from previous study  . PERIPHERAL NEUROPATHY 05/12/2008  . Personal history of colonic polyps 05/12/2008  . Preventative health care 06/24/2011  . Sleep apnea   . 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 CARDIOVERSION N/A 04/03/2013   Procedure: CARDIOVERSION;  Surgeon: Pixie Casino, MD;  Location: [facility] ENDOSCOPY;  Service: Cardiovascular;  Laterality: N/A;  . COLONOSCOPY    . HAND SURGERY     Thumb joint repair  . POLYPECTOMY    . TEE WITHOUT CARDIOVERSION N/A 04/03/2013   Procedure: TRANSESOPHAGEAL ECHOCARDIOGRAM (TEE);  Surgeon: Pixie Casino, MD;  Location: [facility] ENDOSCOPY;  Service: Cardiovascular;  Laterality: N/A;  . TONSILLECTOMY AND ADENOIDECTOMY    . UPPER GASTROINTESTINAL ENDOSCOPY     dilation    reports that he has never smoked. He has never used smokeless tobacco. He reports current alcohol use of about 1.0 standard drink of alcohol per week. He reports that he does not use drugs. family history includes Breast cancer in his maternal grandmother; Dementia in his mother; Heart disease (age of onset: 87) in his father; Melanoma  in his sister; Stroke in his maternal grandfather. Allergies  Allergen Reactions  . Amoxil [Amoxicillin] Rash   Current Outpatient Medications on File Prior to Visit  Medication Sig Dispense Refill  . atorvastatin (LIPITOR) 40 MG tablet Take 1 tablet (40 mg total) by mouth daily. Annual appt due January must see  provider for future refills 30 tablet 0  . lisinopril (ZESTRIL) 5 MG tablet TAKE 1.5 TABLETS (7.5 MG TOTAL) BY MOUTH DAILY. 135 tablet 2  . meloxicam (MOBIC) 15 MG tablet Take 15 mg by mouth daily.    . metoprolol succinate (TOPROL-XL) 25 MG 24 hr tablet TAKE 1 TABLET BY MOUTH EVERY DAY 90 tablet 2  . OVER THE COUNTER MEDICATION Magnesium Citrate tablets 200 mg. Daily.    Marland Kitchen zolpidem (AMBIEN) 5 MG tablet TAKE 1 TABLET (5 MG TOTAL) BY MOUTH AT BEDTIME AS NEEDED FOR SLEEP. 90 tablet 1   No current facility-administered medications on file prior to visit.   Review of Systems All otherwise neg per pt    Objective:   Physical Exam BP (!) 128/100 (BP Location: Left Arm, Patient Position: Sitting, Cuff Size: Large)   Pulse 73   Temp 98.5 F (36.9 C) (Oral)   Ht 6\' 5"  (1.956 m)   Wt 233 lb (105.7 kg)   SpO2 98%   BMI 27.63 kg/m  VS noted,  Constitutional: Pt appears in NAD HENT: Head: NCAT.  Right Ear: External ear normal.  Left Ear: External ear normal.  Eyes: . Pupils are equal, round, and reactive to light. Conjunctivae and EOM are normal Nose: without d/c or deformity Neck: Neck supple. Gross normal ROM Cardiovascular: Normal rate and regular rhythm.   Pulmonary/Chest: Effort normal and breath sounds without rales or wheezing.  Abd:  Soft, NT, ND, + BS, no organomegaly Neurological: Pt is alert. At baseline orientation, motor grossly intact Skin: Skin is warm. No rashes, other new lesions, no LE edema Psychiatric: Pt behavior is normal without agitation  All otherwise neg per pt Lab Results  Component Value Date   WBC 4.7 01/09/2019   HGB 15.5 01/09/2019   HCT 45.8 01/09/2019   PLT 249.0 01/09/2019   GLUCOSE 82 01/09/2019   CHOL 149 01/09/2019   TRIG 67.0 01/09/2019   HDL 40.90 01/09/2019   LDLCALC 95 01/09/2019   ALT 16 01/09/2019   AST 16 01/09/2019   NA 141 01/09/2019   K 4.2 01/09/2019   CL 106 01/09/2019   CREATININE 1.04 01/09/2019   BUN 12 01/09/2019   CO2 26  01/09/2019   TSH 3.23 01/09/2019   PSA 0.90 01/09/2019   INR 1.19 04/01/2013      Assessment & Plan:

## 2020-08-15 ENCOUNTER — Encounter: Payer: Self-pay | Admitting: Internal Medicine

## 2020-08-15 NOTE — Assessment & Plan Note (Signed)
For uroxatral asd

## 2020-08-15 NOTE — Assessment & Plan Note (Signed)
stable overall by history and exam, recent data reviewed with pt, and pt to continue medical treatment as before,  to f/u any worsening symptoms or concerns  

## 2020-08-15 NOTE — Assessment & Plan Note (Signed)

## 2020-08-15 NOTE — Assessment & Plan Note (Signed)
Cont replacement 

## 2020-08-23 ENCOUNTER — Encounter: Payer: Self-pay | Admitting: Internal Medicine

## 2020-08-23 DIAGNOSIS — I700 Atherosclerosis of aorta: Secondary | ICD-10-CM

## 2020-08-23 HISTORY — DX: Atherosclerosis of aorta: I70.0

## 2020-08-23 NOTE — Assessment & Plan Note (Signed)
This diagnosis is noted, pt declines statin beyond current treatment, and will continue lower cholesterol diet and there cardiac risk modifying measures

## 2020-09-07 DIAGNOSIS — R69 Illness, unspecified: Secondary | ICD-10-CM | POA: Diagnosis not present

## 2020-09-07 DIAGNOSIS — H43811 Vitreous degeneration, right eye: Secondary | ICD-10-CM | POA: Diagnosis not present

## 2020-09-07 DIAGNOSIS — H43393 Other vitreous opacities, bilateral: Secondary | ICD-10-CM | POA: Diagnosis not present

## 2020-09-07 DIAGNOSIS — H43812 Vitreous degeneration, left eye: Secondary | ICD-10-CM | POA: Diagnosis not present

## 2020-09-14 DIAGNOSIS — H35373 Puckering of macula, bilateral: Secondary | ICD-10-CM | POA: Diagnosis not present

## 2020-09-14 DIAGNOSIS — H43393 Other vitreous opacities, bilateral: Secondary | ICD-10-CM | POA: Diagnosis not present

## 2020-09-14 DIAGNOSIS — H43813 Vitreous degeneration, bilateral: Secondary | ICD-10-CM | POA: Diagnosis not present

## 2020-09-15 ENCOUNTER — Encounter: Payer: Self-pay | Admitting: Cardiovascular Disease

## 2020-09-15 ENCOUNTER — Other Ambulatory Visit: Payer: Self-pay

## 2020-09-15 ENCOUNTER — Ambulatory Visit (INDEPENDENT_AMBULATORY_CARE_PROVIDER_SITE_OTHER): Payer: Medicare HMO | Admitting: Cardiovascular Disease

## 2020-09-15 VITALS — BP 125/83 | HR 60 | Temp 97.2°F | Ht 78.0 in | Wt 243.4 lb

## 2020-09-15 DIAGNOSIS — I10 Essential (primary) hypertension: Secondary | ICD-10-CM

## 2020-09-15 DIAGNOSIS — I480 Paroxysmal atrial fibrillation: Secondary | ICD-10-CM

## 2020-09-15 DIAGNOSIS — G4733 Obstructive sleep apnea (adult) (pediatric): Secondary | ICD-10-CM | POA: Diagnosis not present

## 2020-09-15 DIAGNOSIS — E785 Hyperlipidemia, unspecified: Secondary | ICD-10-CM | POA: Diagnosis not present

## 2020-09-15 NOTE — Patient Instructions (Signed)
Medication Instructions:  TRY DECREASING YOUR MELOXICAM TO 1/2 TABLET AS NEEDED   *If you need a refill on your cardiac medications before your next appointment, please call your pharmacy*  Lab Work: LP/CMET/THS/CBC   If you have labs (blood work) drawn today and your tests are completely normal, you will receive your results only by: Marland Kitchen MyChart Message (if you have MyChart) OR . A paper copy in the mail If you have any lab test that is abnormal or we need to change your treatment, we will call you to review the results.  Testing/Procedures: NONE  Follow-Up: At [facility], you and your health needs are our priority.  As part of our continuing mission to provide you with exceptional heart care, we have created designated Provider Care Teams.  These Care Teams include your primary Cardiologist (physician) and Advanced Practice Providers (APPs -  Physician Assistants and Nurse Practitioners) who all work together to provide you with the care you need, when you need it.  We recommend signing up for the patient portal called "MyChart".  Sign up information is provided on this After Visit Summary.  MyChart is used to connect with patients for Virtual Visits (Telemedicine).  Patients are able to view lab/test results, encounter notes, upcoming appointments, etc.  Non-urgent messages can be sent to your provider as well.   To learn more about what you can do with MyChart, go to [email or web address].    Your next appointment:   6 month(s)  You will receive a reminder letter in the mail two months in advance. If you don't receive a letter, please call our office to schedule the follow-up appointment.  The format for your next appointment:   In Person  Provider:   You may see DR Claiborne Billings  or one of the following Advanced Practice Providers on your designated Care Team:    Almyra Deforest, PA-C  Fabian Sharp, PA-C or   Roby Lofts, [place]

## 2020-09-15 NOTE — Progress Notes (Signed)
Patient ID: Dustin Berry., male   DOB: [birth year], 74 y.o.   MRN: [id number]     HPI: Dustin Berry. is a 74 y.o. male who presents for a 13 month followup cardiology evaluation.     Dustin Berry has a long-standing history of paroxysmal atrial fibrillation. Initially in 2000 he was evaluated at [facility] and was treated at that time with flecainide. He subsequently developed breakthrough arrhythmia's and had done well with Rythmol SR and can, beta blocker. Remotely, he had seen Dr. Rosita Fire at [facility] as well as Dr. Glennon Mac at [facility] for consideration of atrial fibrillation ablation in 2011 at that time a decision not to have therapy. He had done well until recently but developed recurrent problems with recurrent atrial fibrillation despite medical therapy. He also has been diagnosed with obstructive sleep apnea and has been utilizing CPAP therapy with 100% compliance. He ultimately went back to [facility] and on June 6,2014 underwent pulmonary vein isolation of all pulmonary veins and radiofrequency ablation of CTI with bidirectional block noted with RA and CS pacing by Dr. Glennon Mac. He has been on eliquis 5 mg twice a day for anticoagulation. At that time he was taken off his multaq and metoprolol.  When I saw him in 2014 he started to notice white blood pressure elevation. He was unaware of any recurrent atrial fibrillation. He had been using his CPAP therapy. At that time, his blood pressure was 130/100. I elected to add low-dose lisinopril at 5 mg per day socially underwent a 2-D echo Doppler study on 11/21/2013. This showed an ejection fraction in the 45-50% range with grade 1 diastolic dysfunction. No definitive wall motion abnormalities were detected although the possibility was raised concerning possible mid inferior hypocontractility. He did have systolic bowing without definitive prolapse of his mitral valve with mild MR.    He underwent a one-year follow-up evaluation at [facility]  for his atrial fibrillation ablation.  He was maintaining sinus rhythm.  At that time, his anticoagulation was discontinued and he was resumed on baby aspirin for antiplatelet therapy.  He apparently has stopped using CPAP therapy over the past 3 years.  He denies any issues with his sleep.  He is unaware of any significant recurrent palpitations.  He has been monitoring his blood pressure at home and he states this is typically been running approximately 941 systolically.  He denies chest pain or palpitations.  He has back issues which has limited his exercise such that he predominantly just walks.  He no longer plays basketball.  When I saw him in February 2019 he was in the process of selling his house, and he is building a new house.  In the interim he was notusing his CPAP and that this is in a box preparing for his move.  He also has moved his [employer] center out of [employer] to the [place] region.  He is unaware of any recurrent atrial fibrillation.  Occasionally he admitted to palpitations which he senses particularly when he lies on his side.  He underwent a follow-up echo Doppler study in February 2018 which showed an EF of 50-55% with grade 1 diastolic dysfunction.  His aortic root was mildly dilated and measured 4.5 cm.  There was mitral valve prolapse involving the anterior leaflet and posterior leaflet with mild MR.  There was mild biatrial enlargement.  In follow-up of his aortic root increase he underwent CT angios of his chest and aorta which showed a  4.3 cm a sending thoracic aortic aneurysm on 03/02/2017.  He was on lisinopril 5 mg for hypertension, atorvastatin 20 for hyperlipidemia.    I last saw him on 08/05/2019 at which time he had remained stable from a cardiac perspective.  He was unaware of any recurrent arrhythmia.  He denied any chest pain. He had noticed his blood pressures slightly elevated and labile with typical blood pressures around 741 systolically.  He is unaware of  any breakthrough atrial fibrillation.    He underwent laboratory with Dr. Jenny Reichmann in February 2020.  Total cholesterol was 149, HDL 40.9, LDL 95, triglycerides 67.  When I last saw him, I recommended titration of lisinopril and apparently subsequent to that evaluation his dose was further titrated to 10 mg daily.  Over the past year, he admits to being stable from a cardiac standpoint.  He is unaware of any recurrent atrial fibrillation.  However there have been times we has noticed some slight increase in palpitations particularly if he would drink 2 to 3 glasses of caffeinated tea.  With caffeine reduction these have improved.  Recently, he has been trying to eat better.  He continues to work approximately 5 and half days per week at his [employer] shop which has moved to the [place] area.  He typically walks about [phone] steps per day usually while at work but denies any significant routine exercise.  He does cut his grass and work in his yard on weekends.  Over the past year he underwent right thumb surgery by Dr. Amedeo Plenty.  Presently he denies chest pain or change in exertional symptomatology.  He saw Dr. Jenny Reichmann for his primary care evaluation but no laboratory was obtained this year.  His last blood work was done in February 2020.  He presents for evaluation.  Past Medical History:  Diagnosis Date  . Abdominal pain 04/01/2013  . ALLERGIC RHINITIS 05/12/2008  . Arthritis   . Atrial fibrillation ([place]) 03/15/2010   14 day event monitor - 1 episode of sinus bradycardia  . Atrial fibrillation with RVR ([place]) 10/21/2012   Echo- EF 50-55%; mild concentric LVH; flow pattern suggestive of impaired LV relaxation; mild mitral valve prolapse, trace mitral regurgitation  . B12 deficiency 06/15/2019  . Back pain    receiving PT  . Boil of buttock 06/24/11  . BPH (benign prostatic hypertrophy) 09/05/2012  . Cataract   . Chest pain with exertion presumed to be tachycardia related along with SOB 04/01/2013  .  ESOPHAGEAL STRICTURE 03/24/2009  . Heart murmur   . Hematuria, possible 04/01/2013  . HTN (hypertension) 12/04/2013  . HYPERLIPIDEMIA 08/18/2007  . Insomnia 06/08/2016  . INSOMNIA-SLEEP DISORDER-UNSPEC 05/14/2009  . Mitral valve prolapse 07/04/2011  . MRSA (methicillin resistant staph aureus) culture positive 5+ years ago  . Neuromuscular disorder ([place])    peripheral neuropathy  . OSA (obstructive sleep apnea) 07/04/2011   sleep study - average AHI 2.5  . Palpitations 03/06/2007   R/P MV - mild perfusion defect in basal inferoseptal, basal inferior, mid inferoseptal, and mid inferior regions, consistent w/ infarct/scar; no scintigraphic evidence of inducible myocardial ischemia; prior non transmural infarct cannot be completely excluded; EF 48%; no significant change from previous study  . PERIPHERAL NEUROPATHY 05/12/2008  . Personal history of colonic polyps 05/12/2008  . Preventative health care 06/24/2011  . Sleep apnea   . 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 CARDIOVERSION N/A 04/03/2013   Procedure: CARDIOVERSION;  Surgeon: Pixie Casino, MD;  Location: [facility] ENDOSCOPY;  Service: Cardiovascular;  Laterality: N/A;  . COLONOSCOPY    . HAND SURGERY     Thumb joint repair  . POLYPECTOMY    . TEE WITHOUT CARDIOVERSION N/A 04/03/2013   Procedure: TRANSESOPHAGEAL ECHOCARDIOGRAM (TEE);  Surgeon: Pixie Casino, MD;  Location: [facility] ENDOSCOPY;  Service: Cardiovascular;  Laterality: N/A;  . TONSILLECTOMY AND ADENOIDECTOMY    . UPPER GASTROINTESTINAL ENDOSCOPY     dilation    Allergies  Allergen Reactions  . Penicillin G Rash  . Amoxil [Amoxicillin] Rash    Current Outpatient Medications  Medication Sig Dispense Refill  . atorvastatin (LIPITOR) 40 MG tablet Take 1 tablet (40 mg total) by mouth daily. Annual appt due January must see provider for future refills 30 tablet 0  . lisinopril (ZESTRIL) 10 MG tablet Take 10 mg by mouth daily.    .  meloxicam (MOBIC) 15 MG tablet Take 15 mg by mouth as needed. TAKE 1/2 TABLET AS NEEDED    . metoprolol succinate (TOPROL-XL) 25 MG 24 hr tablet TAKE 1 TABLET BY MOUTH EVERY DAY 90 tablet 2  . OVER THE COUNTER MEDICATION Magnesium Citrate tablets 200 mg. Daily.    Marland Kitchen zolpidem (AMBIEN) 5 MG tablet TAKE 1 TABLET (5 MG TOTAL) BY MOUTH AT BEDTIME AS NEEDED FOR SLEEP. 90 tablet 1   No current facility-administered medications for this visit.    Social History   Socioeconomic History  . Marital status: Married    Spouse name: Not on file  . Number of children: Not on file  . Years of education: Not on file  . Highest education level: Not on file  Occupational History  . 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 Smoking status: Never Smoker  . Smokeless tobacco: Never Used  Substance and Sexual Activity  . Alcohol use: Yes    Alcohol/week: 1.0 standard drink    Types: 1 Glasses of wine per week  . Drug use: No  . Sexual activity: Not on file  Other Topics Concern  . Not on file  Social History Narrative  . Not on file   Social Determinants of Health   Financial Resource Strain:   . Difficulty of Paying Living Expenses: Not on file  Food Insecurity:   . Worried About Charity fundraiser in the Last Year: Not on file  . Ran Out of Food in the Last Year: Not on file  Transportation Needs:   . Lack of Transportation (Medical): Not on file  . Lack of Transportation (Non-Medical): Not on file  Physical Activity:   . Days of Exercise per Week: Not on file  . Minutes of Exercise per Session: Not on file  Stress:   . Feeling of Stress : Not on file  Social Connections:   . Frequency of Communication with Friends and Family: Not on file  . Frequency of Social Gatherings with Friends and Family: Not on file  . Attends Religious Services: Not on file  . Active Member of Clubs or Organizations: Not on file  . Attends Archivist  Meetings: Not on file  . Marital Status: Not on file  Intimate Partner Violence:   . Fear of Current or Ex-Partner: Not on file  . Emotionally Abused: Not on file  . Physically Abused: Not on file  . Sexually Abused: Not on file    Family History  Problem Relation Age of Onset  .  Heart disease Father 23       died with MI  . Melanoma Sister   . Dementia Mother   . Stroke Maternal Grandfather   . Breast cancer Maternal Grandmother   . Colon cancer Neg Hx   . Esophageal cancer Neg Hx   . Stomach cancer Neg Hx   . Rectal cancer Neg Hx    Social history is notable in that he is married has 4 children. He never smoked cigarettes. He does try to exercise. Previously had played basketball. 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    ROS General: Negative; No fevers, chills, or night sweats;  HEENT: Negative; No changes in vision or hearing, sinus congestion, difficulty swallowing Pulmonary: Negative; No cough, wheezing, shortness of breath, hemoptysis Cardiovascular: See history of present illness GI: Negative; No nausea, vomiting, diarrhea, or abdominal pain GU: Negative; No dysuria, hematuria, or difficulty voiding Musculoskeletal: Negative; no myalgias, joint pain, or weakness Hematologic/Oncology: Negative; no easy bruising, bleeding Endocrine: Negative; no heat/cold intolerance; no diabetes Neuro: Negative; no changes in balance, headaches Skin: Negative; No rashes or skin lesions Psychiatric: Negative; No behavioral problems, depression Sleep: h/o obstructive sleep apnea treated with CPAP.  Last sleep study in March 2018 showed increased syndrome without significant sleep apnea.  No snoring, daytime sleepiness, hypersomnolence, bruxism, restless legs, hypnogognic hallucinations, no cataplexy Other comprehensive 14 point system review is negative.   PE BP 125/83   Pulse 60   Temp (!) 97.2 F (36.2 C)   Ht '6\' 6"'  (1.981 m)   Wt 243 lb 6.4 oz (110.4 kg)    SpO2 95%   BMI 28.13 kg/m    Repeat blood pressure by me 140/70  Wt Readings from Last 3 Encounters:  09/15/20 243 lb 6.4 oz (110.4 kg)  08/12/20 233 lb (105.7 kg)  06/11/20 229 lb (103.9 kg)   General: Alert, oriented, no distress.  Skin: normal turgor, no rashes, warm and dry HEENT: Normocephalic, atraumatic. Pupils equal round and reactive to light; sclera anicteric; extraocular muscles intact;  Nose without nasal septal hypertrophy Mouth/Parynx benign; Mallinpatti scale 3 Neck: No JVD, no carotid bruits; normal carotid upstroke Lungs: clear to ausculatation and percussion; no wheezing or rales Chest wall: without tenderness to palpitation Heart: PMI not displaced, RRR, s1 s2 normal, 1/6 systolic murmur, no diastolic murmur, no rubs, gallops, thrills, or heaves Abdomen: soft, nontender; no hepatosplenomehaly, BS+; abdominal aorta nontender and not dilated by palpation. Back: no CVA tenderness Pulses 2+ Musculoskeletal: full range of motion, normal strength, no joint deformities Extremities: no clubbing cyanosis or edema, Homan's sign negative  Neurologic: grossly nonfocal; Cranial nerves grossly wnl Psychologic: Normal mood and affect   ECG (independently read by me): NSR ay 60; NSST changes; QTc 430 msec  August 2020 ECG (independently read by me): Normal sinus rhythm at 62 bpm.  Nonspecific T changes.  QTc interval normal at 414 ms.  February 2019 ECG (independently read by me): Normal sinus rhythm at 70 bpm.  Isolated PVC.  Nonspecific ST changes.  QTc interval 425 ms.  January 2018 ECG (independently read by me): Normal sinus rhythm at 73 bpm.  PR interval 170 ms, QTc interval 423 ms.  Nonspecific ST-T changes.  December 2014 ECG: Normal sinus rhythm at 66 beats per minute. Nonspecific ST-T changes. QTc interval 429 ms.  LABS:  BMP Latest Ref Rng & Units 01/09/2019 02/02/2018 03/01/2017  Glucose 70 - 99 mg/dL 82 104(H) 93  BUN 6 - 23 mg/dL  '12 17 18  ' Creatinine 0.40 -  1.50 mg/dL 1.04 1.08 1.13  BUN/Creat Ratio 10 - 24 - 16 -  Sodium 135 - 145 mEq/L 141 143 141  Potassium 3.5 - 5.1 mEq/L 4.2 5.0 4.6  Chloride 96 - 112 mEq/L 106 108(H) 111(H)  CO2 19 - 32 mEq/L '26 23 23  ' Calcium 8.4 - 10.5 mg/dL 9.6 9.2 9.3   Hepatic Function Latest Ref Rng & Units 01/09/2019 02/02/2018 01/12/2017  Total Protein 6.0 - 8.3 g/dL 7.0 6.9 6.9  Albumin 3.5 - 5.2 g/dL 4.3 4.3 4.0  AST 0 - 37 U/L '16 19 18  ' ALT 0 - 53 U/L '16 22 18  ' Alk Phosphatase 39 - 117 U/L 66 63 59  Total Bilirubin 0.2 - 1.2 mg/dL 0.9 0.4 0.5  Bilirubin, Direct 0.0 - 0.3 mg/dL 0.2 - -   CBC Latest Ref Rng & Units 01/09/2019 02/02/2018 01/12/2017  WBC 4.0 - 10.5 K/uL 4.7 3.8 4.4  Hemoglobin 13.0 - 17.0 g/dL 15.5 15.4 15.5  Hematocrit 39 - 52 % 45.8 45.2 46.5  Platelets 150 - 400 K/uL 249.0 227 203   Lab Results  Component Value Date   MCV 89.0 01/09/2019   MCV 89 02/02/2018   MCV 88.9 01/12/2017   Lab Results  Component Value Date   TSH 3.23 01/09/2019   Lipid Panel     Component Value Date/Time   CHOL 149 01/09/2019 1331   CHOL 170 02/02/2018 0941   TRIG 67.0 01/09/2019 1331   HDL 40.90 01/09/2019 1331   HDL 38 (L) 02/02/2018 0941   CHOLHDL 4 01/09/2019 1331   VLDL 13.4 01/09/2019 1331   LDLCALC 95 01/09/2019 1331   LDLCALC 115 (H) 02/02/2018 0941    RADIOLOGY: No results found.  IMPRESSION:  1. Essential hypertension   2. Hyperlipidemia, unspecified hyperlipidemia type   3. OSA (obstructive sleep apnea)   4. PAF (paroxysmal atrial fibrillation) [facility]): s/p ablation June 2014     ASSESSMENT AND PLAN: Dustin Berry is a 74 year-old Caucasian male who is a history of paroxysmal atrial fibrillation dating back to 2000 when he was initiated on therapy at [facility].  He underwent successful radiofrequency AF ablation with pulmonary vein isolation in June 2014 by Dr. Norm Salt at [facility]. He is maintaining sinus rhythm.  He had been on eliquis anticoagulation and this was discontinued  by his [place] and now he is just on aspirin 81 mg.  An echo Doppler study in  2014 showed an ejection fraction of 45-50%.  The last echo of February 2018  showed an EF of 67-54%, grade 1 diastolic dysfunction, mitral valve prolapse involving both leaflets with mild MR, mild biatrial enlargement, and mild dilation of his ascending aorta at 4.5 cm.  A CT angio of his chest showed a 4.3 cm descending thoracic aortic aneurysm.  He is remains asymptomatic without chest pain.  Over the past year, his lisinopril dose has been increased and most recently he is now on lisinopril 10 mg daily in addition to metoprolol succinate 25 mg.  He states his blood pressure at home typically runs in the upper 130s to 140s but occasionally can increase to 150.  This typically is in the supine position.  He denies any dizziness with standing.  On exam today his blood pressure in the supine position was 145/82 but when he stood up blood pressure was 124/80.  He was asymptomatic.  He has been taking meloxicam 15 mg on a  daily basis.  I have suggested he try reducing this to 7.5 mg.  His blood pressure when taken by the nurse today was 125/83 in the sitting position.  I am recommending that he undergo follow-up laboratory in the fasting state and will check a 6 chemistry profile, CBC TSH and lipid studies.  Apparently he has been out of his atorvastatin for several months and previously was on 40 mg daily but has not had had any lab work checked since February 2020.  We discussed the importance of sodium restriction.  I also discussed increased exercise if at all possible.  He will monitor his blood pressure at home.  We discussed the importance of avoidance of caffeine.  His resting pulse is 60 on his low-dose metoprolol succinate 25 mg and when he was recently seen by his primary care provider his pulse was in the 50s.  He is sleeping well and had been on CPAP therapy.  Most recent sleep study in March 2019 showed improvement with  increased upper airway resistance syndrome and mild sleep apnea with supine position with an AHI 10.9.  I will contact him regarding his laboratory results I will see him in 6 months for cardiology reevaluation or sooner as needed.    Troy Sine, MD, [facility]  09/15/2020 11:37 AM

## 2020-09-16 ENCOUNTER — Other Ambulatory Visit: Payer: Self-pay

## 2020-09-16 ENCOUNTER — Telehealth: Payer: Self-pay | Admitting: Cardiovascular Disease

## 2020-09-16 ENCOUNTER — Ambulatory Visit (AMBULATORY_SURGERY_CENTER): Payer: Self-pay | Admitting: *Deleted

## 2020-09-16 ENCOUNTER — Encounter: Payer: Self-pay | Admitting: Gastroenterology

## 2020-09-16 VITALS — Ht 77.0 in | Wt 241.0 lb

## 2020-09-16 DIAGNOSIS — Z86010 Personal history of colonic polyps: Secondary | ICD-10-CM

## 2020-09-16 MED ORDER — PLENVU 140 G PO SOLR: 1.0000 | Freq: Once | ORAL | 0 refills | Status: AC

## 2020-09-16 NOTE — Progress Notes (Signed)
Patient is here in-person for PV. Patient denies any allergies to eggs or soy. Patient denies any problems with anesthesia/sedation. Patient denies any oxygen use at home. Patient denies taking any diet/weight loss medications or blood thinners. Patient is not being treated for MRSA or C-diff. Patient is aware of our care-partner policy and ASUOR-56 safety protocol.   COVID-19 vaccines completed on 02/24/20, per patient.   Prep Prescription coupon given to the patient.

## 2020-09-16 NOTE — Telephone Encounter (Signed)
Patient returned call about labs, stated he will come in tomorrow to have them done.

## 2020-09-18 DIAGNOSIS — I10 Essential (primary) hypertension: Secondary | ICD-10-CM | POA: Diagnosis not present

## 2020-09-18 DIAGNOSIS — G4733 Obstructive sleep apnea (adult) (pediatric): Secondary | ICD-10-CM | POA: Diagnosis not present

## 2020-09-18 DIAGNOSIS — E785 Hyperlipidemia, unspecified: Secondary | ICD-10-CM | POA: Diagnosis not present

## 2020-09-18 LAB — CBC WITH DIFFERENTIAL/PLATELET
Basophils Absolute: 0 10*3/uL (ref 0.0–0.2)
Basos: 1 %
EOS (ABSOLUTE): 0.2 10*3/uL (ref 0.0–0.4)
Eos: 4 %
Hematocrit: 45.4 % (ref 37.5–51.0)
Hemoglobin: 15.4 g/dL (ref 13.0–17.7)
Immature Grans (Abs): 0 10*3/uL (ref 0.0–0.1)
Immature Granulocytes: 1 %
Lymphocytes Absolute: 1.3 10*3/uL (ref 0.7–3.1)
Lymphs: 33 %
MCH: 29.6 pg (ref 26.6–33.0)
MCHC: 33.9 g/dL (ref 31.5–35.7)
MCV: 87 fL (ref 79–97)
Monocytes Absolute: 0.5 10*3/uL (ref 0.1–0.9)
Monocytes: 14 %
Neutrophils Absolute: 1.8 10*3/uL (ref 1.4–7.0)
Neutrophils: 47 %
Platelets: 246 10*3/uL (ref 150–450)
RBC: 5.21 x10E6/uL (ref 4.14–5.80)
RDW: 13.2 % (ref 11.6–15.4)
WBC: 3.8 10*3/uL (ref 3.4–10.8)

## 2020-09-18 LAB — LIPID PANEL
Chol/HDL Ratio: 4.5 ratio (ref 0.0–5.0)
Cholesterol, Total: 178 mg/dL (ref 100–199)
HDL: 40 mg/dL (ref >39)
LDL Chol Calc (NIH): 122 mg/dL — ABNORMAL HIGH (ref 0–99)
Triglycerides: 87 mg/dL (ref 0–149)
VLDL Cholesterol Cal: 16 mg/dL (ref 5–40)

## 2020-09-18 LAB — COMPREHENSIVE METABOLIC PANEL
ALT: 18 IU/L (ref 0–44)
AST: 17 IU/L (ref 0–40)
Albumin/Globulin Ratio: 1.8 (ref 1.2–2.2)
Albumin: 4.3 g/dL (ref 3.7–4.7)
Alkaline Phosphatase: 70 IU/L (ref 44–121)
BUN/Creatinine Ratio: 11 (ref 10–24)
BUN: 12 mg/dL (ref 8–27)
Bilirubin Total: 0.6 mg/dL (ref 0.0–1.2)
CO2: 24 mmol/L (ref 20–29)
Calcium: 9.7 mg/dL (ref 8.6–10.2)
Chloride: 104 mmol/L (ref 96–106)
Creatinine, Ser: 1.12 mg/dL (ref 0.76–1.27)
GFR calc Af Amer: 75 mL/min/{1.73_m2} (ref >59)
GFR calc non Af Amer: 65 mL/min/{1.73_m2} (ref >59)
Globulin, Total: 2.4 g/dL (ref 1.5–4.5)
Glucose: 98 mg/dL (ref 65–99)
Potassium: 4.7 mmol/L (ref 3.5–5.2)
Sodium: 140 mmol/L (ref 134–144)
Total Protein: 6.7 g/dL (ref 6.0–8.5)

## 2020-09-18 LAB — TSH: TSH: 2.98 u[IU]/mL (ref 0.450–4.500)

## 2020-09-28 ENCOUNTER — Telehealth: Payer: Self-pay

## 2020-09-28 ENCOUNTER — Telehealth: Payer: Self-pay | Admitting: Gastroenterology

## 2020-09-28 NOTE — Telephone Encounter (Signed)
Called patient back and he states he gets these boils frequently and they usually go away on their own, but he has had this one for about 1 week. It is not oozing and is hard.

## 2020-09-28 NOTE — Telephone Encounter (Signed)
Left message for patient to please call back. 

## 2020-09-28 NOTE — Telephone Encounter (Signed)
Called patient and let him know Dr. Fuller Plan wants the MRSA boil completely healed before doing a Colonoscopy. Patient said he will call his Dermatologist for treatment and call us back when it is completely healed, to reschedule his colonoscopy

## 2020-09-28 NOTE — Telephone Encounter (Signed)
With MRSA it should be completely healed before colonoscopy.

## 2020-09-28 NOTE — Telephone Encounter (Signed)
Left message for patient to call back  

## 2020-09-28 NOTE — Telephone Encounter (Signed)
Pt is returning a missed call from [place].

## 2020-09-30 ENCOUNTER — Telehealth: Payer: Self-pay | Admitting: Cardiovascular Disease

## 2020-09-30 ENCOUNTER — Encounter: Payer: Medicare HMO | Admitting: Gastroenterology

## 2020-09-30 NOTE — Telephone Encounter (Signed)
Dustin Sine, MD  09/27/2020 4:44 PM EDT     TSH normal; CBC normal chemistry normal; LDL increased at 122. He had been on atorvastatin 40 mg but apparently had run out for several months. With increased LDL, resume atorvastatin 40 mg will consider rosuvastatin 20 mg.   Left message to call back

## 2020-09-30 NOTE — Telephone Encounter (Signed)
Patient returning call for lab results. 

## 2020-10-01 DIAGNOSIS — L0889 Other specified local infections of the skin and subcutaneous tissue: Secondary | ICD-10-CM | POA: Diagnosis not present

## 2020-10-01 DIAGNOSIS — L0231 Cutaneous abscess of buttock: Secondary | ICD-10-CM | POA: Diagnosis not present

## 2020-10-01 DIAGNOSIS — Z85828 Personal history of other malignant neoplasm of skin: Secondary | ICD-10-CM | POA: Diagnosis not present

## 2020-10-05 NOTE — Telephone Encounter (Signed)
Left message for pt to call back  °

## 2020-10-07 NOTE — Telephone Encounter (Signed)
Lm to call back ./cy 

## 2020-10-12 DIAGNOSIS — M9902 Segmental and somatic dysfunction of thoracic region: Secondary | ICD-10-CM | POA: Diagnosis not present

## 2020-10-12 DIAGNOSIS — M5136 Other intervertebral disc degeneration, lumbar region: Secondary | ICD-10-CM | POA: Diagnosis not present

## 2020-10-12 DIAGNOSIS — M5137 Other intervertebral disc degeneration, lumbosacral region: Secondary | ICD-10-CM | POA: Diagnosis not present

## 2020-10-12 DIAGNOSIS — M9904 Segmental and somatic dysfunction of sacral region: Secondary | ICD-10-CM | POA: Diagnosis not present

## 2020-10-12 DIAGNOSIS — M5135 Other intervertebral disc degeneration, thoracolumbar region: Secondary | ICD-10-CM | POA: Diagnosis not present

## 2020-10-12 DIAGNOSIS — M9903 Segmental and somatic dysfunction of lumbar region: Secondary | ICD-10-CM | POA: Diagnosis not present

## 2020-10-12 MED ORDER — ATORVASTATIN CALCIUM 40 MG PO TABS: 40.0000 mg | ORAL_TABLET | Freq: Every day | ORAL | 5 refills | Status: DC

## 2020-10-14 DIAGNOSIS — M9903 Segmental and somatic dysfunction of lumbar region: Secondary | ICD-10-CM | POA: Diagnosis not present

## 2020-10-14 DIAGNOSIS — M5135 Other intervertebral disc degeneration, thoracolumbar region: Secondary | ICD-10-CM | POA: Diagnosis not present

## 2020-10-14 DIAGNOSIS — M9904 Segmental and somatic dysfunction of sacral region: Secondary | ICD-10-CM | POA: Diagnosis not present

## 2020-10-14 DIAGNOSIS — M5136 Other intervertebral disc degeneration, lumbar region: Secondary | ICD-10-CM | POA: Diagnosis not present

## 2020-10-14 DIAGNOSIS — M9902 Segmental and somatic dysfunction of thoracic region: Secondary | ICD-10-CM | POA: Diagnosis not present

## 2020-10-14 DIAGNOSIS — M5137 Other intervertebral disc degeneration, lumbosacral region: Secondary | ICD-10-CM | POA: Diagnosis not present

## 2020-10-19 ENCOUNTER — Encounter: Payer: Self-pay | Admitting: Internal Medicine

## 2020-10-21 ENCOUNTER — Other Ambulatory Visit: Payer: Self-pay | Admitting: Internal Medicine

## 2020-10-21 DIAGNOSIS — E559 Vitamin D deficiency, unspecified: Secondary | ICD-10-CM

## 2020-10-21 DIAGNOSIS — Z0000 Encounter for general adult medical examination without abnormal findings: Secondary | ICD-10-CM

## 2020-10-21 DIAGNOSIS — E538 Deficiency of other specified B group vitamins: Secondary | ICD-10-CM

## 2020-10-22 ENCOUNTER — Other Ambulatory Visit: Payer: Self-pay | Admitting: Internal Medicine

## 2020-10-22 DIAGNOSIS — M25551 Pain in right hip: Secondary | ICD-10-CM | POA: Diagnosis not present

## 2020-10-22 MED ORDER — ZOLPIDEM TARTRATE 5 MG PO TABS: 5.0000 mg | ORAL_TABLET | Freq: Every evening | ORAL | 1 refills | Status: DC | PRN

## 2020-10-23 ENCOUNTER — Other Ambulatory Visit (INDEPENDENT_AMBULATORY_CARE_PROVIDER_SITE_OTHER): Payer: Medicare HMO

## 2020-10-23 DIAGNOSIS — E559 Vitamin D deficiency, unspecified: Secondary | ICD-10-CM | POA: Diagnosis not present

## 2020-10-23 DIAGNOSIS — Z0000 Encounter for general adult medical examination without abnormal findings: Secondary | ICD-10-CM

## 2020-10-23 DIAGNOSIS — E538 Deficiency of other specified B group vitamins: Secondary | ICD-10-CM

## 2020-10-23 LAB — LIPID PANEL
Cholesterol: 119 mg/dL (ref 0–200)
HDL: 32.6 mg/dL — ABNORMAL LOW (ref >39.00)
LDL Cholesterol: 73 mg/dL (ref 0–99)
NonHDL: 86.63
Total CHOL/HDL Ratio: 4
Triglycerides: 70 mg/dL (ref 0.0–149.0)
VLDL: 14 mg/dL (ref 0.0–40.0)

## 2020-10-23 LAB — BASIC METABOLIC PANEL
BUN: 20 mg/dL (ref 6–23)
CO2: 27 mEq/L (ref 19–32)
Calcium: 9.7 mg/dL (ref 8.4–10.5)
Chloride: 103 mEq/L (ref 96–112)
Creatinine, Ser: 1.29 mg/dL (ref 0.40–1.50)
GFR: 55.03 mL/min — ABNORMAL LOW (ref >60.00)
Glucose, Bld: 94 mg/dL (ref 70–99)
Potassium: 4.4 mEq/L (ref 3.5–5.1)
Sodium: 137 mEq/L (ref 135–145)

## 2020-10-23 LAB — HEPATIC FUNCTION PANEL
ALT: 21 U/L (ref 0–53)
AST: 19 U/L (ref 0–37)
Albumin: 4.3 g/dL (ref 3.5–5.2)
Alkaline Phosphatase: 59 U/L (ref 39–117)
Bilirubin, Direct: 0.1 mg/dL (ref 0.0–0.3)
Total Bilirubin: 0.7 mg/dL (ref 0.2–1.2)
Total Protein: 7.1 g/dL (ref 6.0–8.3)

## 2020-10-23 LAB — CBC WITH DIFFERENTIAL/PLATELET
Basophils Absolute: 0 10*3/uL (ref 0.0–0.1)
Basophils Relative: 0.6 % (ref 0.0–3.0)
Eosinophils Absolute: 0.1 10*3/uL (ref 0.0–0.7)
Eosinophils Relative: 1.8 % (ref 0.0–5.0)
HCT: 46 % (ref 39.0–52.0)
Hemoglobin: 15.4 g/dL (ref 13.0–17.0)
Lymphocytes Relative: 39.6 % (ref 12.0–46.0)
Lymphs Abs: 2.3 10*3/uL (ref 0.7–4.0)
MCHC: 33.5 g/dL (ref 30.0–36.0)
MCV: 88 fl (ref 78.0–100.0)
Monocytes Absolute: 0.6 10*3/uL (ref 0.1–1.0)
Monocytes Relative: 10.3 % (ref 3.0–12.0)
Neutro Abs: 2.8 10*3/uL (ref 1.4–7.7)
Neutrophils Relative %: 47.7 % (ref 43.0–77.0)
Platelets: 253 10*3/uL (ref 150.0–400.0)
RBC: 5.23 Mil/uL (ref 4.22–5.81)
RDW: 13.3 % (ref 11.5–15.5)
WBC: 5.8 10*3/uL (ref 4.0–10.5)

## 2020-10-23 LAB — URINALYSIS, ROUTINE W REFLEX MICROSCOPIC
Bilirubin Urine: NEGATIVE
Ketones, ur: NEGATIVE
Leukocytes,Ua: NEGATIVE
Nitrite: NEGATIVE
RBC / HPF: NONE SEEN (ref 0–?)
Specific Gravity, Urine: 1.025 (ref 1.000–1.030)
Total Protein, Urine: NEGATIVE
Urine Glucose: NEGATIVE
Urobilinogen, UA: 0.2 (ref 0.0–1.0)
pH: 6 (ref 5.0–8.0)

## 2020-10-23 LAB — VITAMIN D 25 HYDROXY (VIT D DEFICIENCY, FRACTURES): VITD: 41.69 ng/mL (ref 30.00–100.00)

## 2020-10-23 LAB — TSH: TSH: 3.25 u[IU]/mL (ref 0.35–4.50)

## 2020-10-23 LAB — PSA: PSA: 1.19 ng/mL (ref 0.10–4.00)

## 2020-10-23 LAB — VITAMIN B12: Vitamin B-12: 531 pg/mL (ref 211–911)

## 2020-10-25 ENCOUNTER — Encounter: Payer: Self-pay | Admitting: Internal Medicine

## 2020-10-27 ENCOUNTER — Encounter: Payer: Self-pay | Admitting: Internal Medicine

## 2020-11-13 ENCOUNTER — Ambulatory Visit (AMBULATORY_SURGERY_CENTER): Payer: Self-pay

## 2020-11-13 ENCOUNTER — Other Ambulatory Visit: Payer: Self-pay

## 2020-11-13 VITALS — Ht 78.0 in | Wt 241.0 lb

## 2020-11-13 DIAGNOSIS — Z86010 Personal history of colonic polyps: Secondary | ICD-10-CM

## 2020-11-13 NOTE — Progress Notes (Signed)
No allergies to soy or egg Pt is not on blood thinners or diet pills Denies issues with sedation/intubation Denies atrial flutter.  Had afib, but was treated with an ablation.  Denies constipation   Emmi instructions given to pt  Pt is aware of Covid safety and care partner requirements.     Has bowel prep on hand

## 2020-11-21 DIAGNOSIS — M9903 Segmental and somatic dysfunction of lumbar region: Secondary | ICD-10-CM | POA: Diagnosis not present

## 2020-11-21 DIAGNOSIS — M5135 Other intervertebral disc degeneration, thoracolumbar region: Secondary | ICD-10-CM | POA: Diagnosis not present

## 2020-11-21 DIAGNOSIS — M9904 Segmental and somatic dysfunction of sacral region: Secondary | ICD-10-CM | POA: Diagnosis not present

## 2020-11-21 DIAGNOSIS — M5136 Other intervertebral disc degeneration, lumbar region: Secondary | ICD-10-CM | POA: Diagnosis not present

## 2020-11-21 DIAGNOSIS — M9902 Segmental and somatic dysfunction of thoracic region: Secondary | ICD-10-CM | POA: Diagnosis not present

## 2020-11-21 DIAGNOSIS — M5137 Other intervertebral disc degeneration, lumbosacral region: Secondary | ICD-10-CM | POA: Diagnosis not present

## 2020-11-23 ENCOUNTER — Encounter: Payer: Self-pay | Admitting: Gastroenterology

## 2020-11-25 DIAGNOSIS — M9902 Segmental and somatic dysfunction of thoracic region: Secondary | ICD-10-CM | POA: Diagnosis not present

## 2020-11-25 DIAGNOSIS — M9903 Segmental and somatic dysfunction of lumbar region: Secondary | ICD-10-CM | POA: Diagnosis not present

## 2020-11-25 DIAGNOSIS — M5135 Other intervertebral disc degeneration, thoracolumbar region: Secondary | ICD-10-CM | POA: Diagnosis not present

## 2020-11-25 DIAGNOSIS — M5137 Other intervertebral disc degeneration, lumbosacral region: Secondary | ICD-10-CM | POA: Diagnosis not present

## 2020-11-25 DIAGNOSIS — M9904 Segmental and somatic dysfunction of sacral region: Secondary | ICD-10-CM | POA: Diagnosis not present

## 2020-11-25 DIAGNOSIS — M5136 Other intervertebral disc degeneration, lumbar region: Secondary | ICD-10-CM | POA: Diagnosis not present

## 2020-12-01 DIAGNOSIS — I10 Essential (primary) hypertension: Secondary | ICD-10-CM | POA: Diagnosis not present

## 2020-12-01 DIAGNOSIS — M5459 Other low back pain: Secondary | ICD-10-CM | POA: Diagnosis not present

## 2020-12-01 DIAGNOSIS — R52 Pain, unspecified: Secondary | ICD-10-CM | POA: Diagnosis not present

## 2020-12-01 DIAGNOSIS — M545 Low back pain, unspecified: Secondary | ICD-10-CM

## 2020-12-01 DIAGNOSIS — R29898 Other symptoms and signs involving the musculoskeletal system: Secondary | ICD-10-CM | POA: Diagnosis not present

## 2020-12-01 DIAGNOSIS — M2550 Pain in unspecified joint: Secondary | ICD-10-CM | POA: Diagnosis not present

## 2020-12-01 DIAGNOSIS — M25551 Pain in right hip: Secondary | ICD-10-CM | POA: Diagnosis not present

## 2020-12-01 DIAGNOSIS — M549 Dorsalgia, unspecified: Secondary | ICD-10-CM | POA: Diagnosis not present

## 2020-12-01 DIAGNOSIS — Z7401 Bed confinement status: Secondary | ICD-10-CM | POA: Diagnosis not present

## 2020-12-01 HISTORY — DX: Low back pain, unspecified: M54.50

## 2020-12-02 ENCOUNTER — Other Ambulatory Visit (HOSPITAL_COMMUNITY): Payer: Self-pay | Admitting: Sports Medicine

## 2020-12-02 ENCOUNTER — Other Ambulatory Visit: Payer: Self-pay | Admitting: Sports Medicine

## 2020-12-02 DIAGNOSIS — M5459 Other low back pain: Secondary | ICD-10-CM

## 2020-12-02 DIAGNOSIS — M25551 Pain in right hip: Secondary | ICD-10-CM

## 2020-12-08 DIAGNOSIS — M5459 Other low back pain: Secondary | ICD-10-CM | POA: Diagnosis not present

## 2020-12-08 DIAGNOSIS — M1611 Unilateral primary osteoarthritis, right hip: Secondary | ICD-10-CM | POA: Diagnosis not present

## 2020-12-09 ENCOUNTER — Ambulatory Visit (AMBULATORY_SURGERY_CENTER): Payer: Medicare HMO | Admitting: Gastroenterology

## 2020-12-09 ENCOUNTER — Encounter: Payer: Self-pay | Admitting: Gastroenterology

## 2020-12-09 ENCOUNTER — Other Ambulatory Visit: Payer: Self-pay

## 2020-12-09 VITALS — BP 130/86 | HR 64 | Temp 96.8°F | Resp 16 | Ht 78.0 in | Wt 241.0 lb

## 2020-12-09 DIAGNOSIS — Z86010 Personal history of colonic polyps: Secondary | ICD-10-CM

## 2020-12-09 DIAGNOSIS — D123 Benign neoplasm of transverse colon: Secondary | ICD-10-CM | POA: Diagnosis not present

## 2020-12-09 DIAGNOSIS — I4891 Unspecified atrial fibrillation: Secondary | ICD-10-CM | POA: Diagnosis not present

## 2020-12-09 DIAGNOSIS — I10 Essential (primary) hypertension: Secondary | ICD-10-CM | POA: Diagnosis not present

## 2020-12-09 DIAGNOSIS — G4733 Obstructive sleep apnea (adult) (pediatric): Secondary | ICD-10-CM | POA: Diagnosis not present

## 2020-12-09 MED ORDER — SODIUM CHLORIDE 0.9 % IV SOLN: 500.0000 mL | Freq: Once | INTRAVENOUS | Status: DC

## 2020-12-09 NOTE — Progress Notes (Signed)
Called to room to assist during endoscopic procedure.  Patient ID and intended procedure confirmed with present staff. Received instructions for my participation in the procedure from the performing physician.  

## 2020-12-09 NOTE — Op Note (Signed)
[place] Patient Name: Dustin Berry Procedure Date: 12/09/2020 8:08 AM MRN: [id number] Endoscopist: Ladene Artist , MD Age: 75 Referring MD:  Date of Birth: 1946-09-24 Gender: Male Account #: [id number] Procedure:                Colonoscopy Indications:              Surveillance: Personal history of adenomatous                            polyps on last colonoscopy 3 years ago Medicines:                Monitored Anesthesia Care Procedure:                Pre-Anesthesia Assessment:                           - Prior to the procedure, a History and Physical                            was performed, and patient medications and                            allergies were reviewed. The patient's tolerance of                            previous anesthesia was also reviewed. The risks                            and benefits of the procedure and the sedation                            options and risks were discussed with the patient.                            All questions were answered, and informed consent                            was obtained. Prior Anticoagulants: The patient has                            taken no previous anticoagulant or antiplatelet                            agents. ASA Grade Assessment: II - A patient with                            mild systemic disease. After reviewing the risks                            and benefits, the patient was deemed in                            satisfactory condition to undergo the procedure.  After obtaining informed consent, the colonoscope                            was passed under direct vision. Throughout the                            procedure, the patient's blood pressure, pulse, and                            oxygen saturations were monitored continuously. The                            Colonoscope was introduced through the anus and                            advanced to the the cecum,  identified by                            appendiceal orifice and ileocecal valve. The                            ileocecal valve, appendiceal orifice, and rectum                            were photographed. The quality of the bowel                            preparation was excellent. The colonoscopy was                            performed without difficulty. The patient tolerated                            the procedure well. Scope In: 8:15:04 AM Scope Out: 8:27:30 AM Scope Withdrawal Time: 0 hours 10 minutes 34 seconds  Total Procedure Duration: 0 hours 12 minutes 26 seconds  Findings:                 The perianal and digital rectal examinations were                            normal.                           A 5 mm polyp was found in the transverse colon. The                            polyp was sessile. The polyp was removed with a                            cold snare. Resection and retrieval were complete.                           A few small-mouthed diverticula were found in the  left colon. There was no evidence of diverticular                            bleeding.                           Internal hemorrhoids were found during                            retroflexion. The hemorrhoids were moderate and                            Grade I (internal hemorrhoids that do not prolapse).                           The exam was otherwise without abnormality on                            direct and retroflexion views. Complications:            No immediate complications. Estimated blood loss:                            None. Estimated Blood Loss:     Estimated blood loss: none. Impression:               - One 5 mm polyp in the transverse colon, removed                            with a cold snare. Resected and retrieved.                           - Mild diverticulosis in the left colon.                           - Internal hemorrhoids.                            - The examination was otherwise normal on direct                            and retroflexion views. Recommendation:           - Consider repeat colonoscopy after studies are                            complete for surveillance based on pathology                            results.                           - Patient has a contact number available for                            emergencies. The signs and symptoms of potential  delayed complications were discussed with the                            patient. Return to normal activities tomorrow.                            Written discharge instructions were provided to the                            patient.                           - High fiber diet.                           - Continue present medications.                           - Await pathology results. Ladene Artist, MD 12/09/2020 8:30:44 AM This report has been signed electronically.

## 2020-12-09 NOTE — Progress Notes (Signed)
Report to PACU, RN, vss, BBS= Clear.  

## 2020-12-09 NOTE — Patient Instructions (Signed)
Thank you for allowing [place] to care for you today!  Await final pathology result of polyp removed.  Will make recommendation at that time concerning future surveillance colonoscopy.  Resume previous diet and medications today.  Return to your normal activities tomorrow.    YOU HAD AN ENDOSCOPIC PROCEDURE TODAY AT THE [facility]:   Refer to the procedure report that was given to you for any specific questions about what was found during the examination.  If the procedure report does not answer your questions, please call your gastroenterologist to clarify.  If you requested that your care partner not be given the details of your procedure findings, then the procedure report has been included in a sealed envelope for you to review at your convenience later.  YOU SHOULD EXPECT: Some feelings of bloating in the abdomen. Passage of more gas than usual.  Walking can help get rid of the air that was put into your GI tract during the procedure and reduce the bloating. If you had a lower endoscopy (such as a colonoscopy or flexible sigmoidoscopy) you may notice spotting of blood in your stool or on the toilet paper. If you underwent a bowel prep for your procedure, you may not have a normal bowel movement for a few days.  Please Note:  You might notice some irritation and congestion in your nose or some drainage.  This is from the oxygen used during your procedure.  There is no need for concern and it should clear up in a day or so.  SYMPTOMS TO REPORT IMMEDIATELY:   Following lower endoscopy (colonoscopy or flexible sigmoidoscopy):  Excessive amounts of blood in the stool  Significant tenderness or worsening of abdominal pains  Swelling of the abdomen that is new, acute  Fever of 100F or higher   For urgent or emergent issues, a gastroenterologist can be reached at any hour by calling [phone]. Do not use MyChart messaging for urgent concerns.    DIET:  We do recommend a small  meal at first, but then you may proceed to your regular diet.  Drink plenty of fluids but you should avoid alcoholic beverages for 24 hours.  ACTIVITY:  You should plan to take it easy for the rest of today and you should NOT DRIVE or use heavy machinery until tomorrow (because of the sedation medicines used during the test).    FOLLOW UP: Our staff will call the number listed on your records 48-72 hours following your procedure to check on you and address any questions or concerns that you may have regarding the information given to you following your procedure. If we do not reach you, we will leave a message.  We will attempt to reach you two times.  During this call, we will ask if you have developed any symptoms of COVID 19. If you develop any symptoms (ie: fever, flu-like symptoms, shortness of breath, cough etc.) before then, please call [phone].  If you test positive for Covid 19 in the 2 weeks post procedure, please call and report this information to [place].    If any biopsies were taken you will be contacted by phone or by letter within the next 1-3 weeks.  Please call us at [phone] if you have not heard about the biopsies in 3 weeks.    SIGNATURES/CONFIDENTIALITY: You and/or your care partner have signed paperwork which will be entered into your electronic medical record.  These signatures attest to the fact that that the information  above on your After Visit Summary has been reviewed and is understood.  Full responsibility of the confidentiality of this discharge information lies with you and/or your care-partner. 

## 2020-12-09 NOTE — Progress Notes (Signed)
VS-MO  Pt's states no medical or surgical changes since previsit or office visit.  

## 2020-12-10 DIAGNOSIS — M545 Low back pain, unspecified: Secondary | ICD-10-CM | POA: Diagnosis not present

## 2020-12-10 DIAGNOSIS — M5416 Radiculopathy, lumbar region: Secondary | ICD-10-CM | POA: Diagnosis not present

## 2020-12-10 DIAGNOSIS — M51369 Other intervertebral disc degeneration, lumbar region without mention of lumbar back pain or lower extremity pain: Secondary | ICD-10-CM

## 2020-12-10 DIAGNOSIS — M5136 Other intervertebral disc degeneration, lumbar region: Secondary | ICD-10-CM | POA: Insufficient documentation

## 2020-12-10 DIAGNOSIS — M5459 Other low back pain: Secondary | ICD-10-CM | POA: Diagnosis not present

## 2020-12-10 HISTORY — DX: Other intervertebral disc degeneration, lumbar region without mention of lumbar back pain or lower extremity pain: M51.369

## 2020-12-10 HISTORY — DX: Other intervertebral disc degeneration, lumbar region: M51.36

## 2020-12-11 ENCOUNTER — Ambulatory Visit (HOSPITAL_COMMUNITY): Payer: Medicare HMO

## 2020-12-11 ENCOUNTER — Telehealth: Payer: Self-pay

## 2020-12-11 ENCOUNTER — Encounter (HOSPITAL_COMMUNITY): Payer: Self-pay

## 2020-12-11 NOTE — Telephone Encounter (Signed)
Left message on follow up call. 

## 2020-12-17 ENCOUNTER — Encounter: Payer: Self-pay | Admitting: Gastroenterology

## 2020-12-17 DIAGNOSIS — M5416 Radiculopathy, lumbar region: Secondary | ICD-10-CM | POA: Diagnosis not present

## 2021-01-18 DIAGNOSIS — L821 Other seborrheic keratosis: Secondary | ICD-10-CM | POA: Diagnosis not present

## 2021-01-18 DIAGNOSIS — D225 Melanocytic nevi of trunk: Secondary | ICD-10-CM | POA: Diagnosis not present

## 2021-01-18 DIAGNOSIS — L0889 Other specified local infections of the skin and subcutaneous tissue: Secondary | ICD-10-CM | POA: Diagnosis not present

## 2021-01-18 DIAGNOSIS — L738 Other specified follicular disorders: Secondary | ICD-10-CM | POA: Diagnosis not present

## 2021-01-18 DIAGNOSIS — L820 Inflamed seborrheic keratosis: Secondary | ICD-10-CM | POA: Diagnosis not present

## 2021-01-18 DIAGNOSIS — A4902 Methicillin resistant Staphylococcus aureus infection, unspecified site: Secondary | ICD-10-CM | POA: Diagnosis not present

## 2021-01-18 DIAGNOSIS — Z85828 Personal history of other malignant neoplasm of skin: Secondary | ICD-10-CM | POA: Diagnosis not present

## 2021-02-24 ENCOUNTER — Other Ambulatory Visit: Payer: Self-pay | Admitting: Cardiovascular Disease

## 2021-04-09 DIAGNOSIS — N401 Enlarged prostate with lower urinary tract symptoms: Secondary | ICD-10-CM | POA: Diagnosis not present

## 2021-04-09 DIAGNOSIS — D3001 Benign neoplasm of right kidney: Secondary | ICD-10-CM | POA: Diagnosis not present

## 2021-04-09 DIAGNOSIS — D1771 Benign lipomatous neoplasm of kidney: Secondary | ICD-10-CM | POA: Diagnosis not present

## 2021-04-09 DIAGNOSIS — R3915 Urgency of urination: Secondary | ICD-10-CM | POA: Diagnosis not present

## 2021-04-09 DIAGNOSIS — M48061 Spinal stenosis, lumbar region without neurogenic claudication: Secondary | ICD-10-CM | POA: Diagnosis not present

## 2021-04-09 DIAGNOSIS — K429 Umbilical hernia without obstruction or gangrene: Secondary | ICD-10-CM | POA: Diagnosis not present

## 2021-04-14 ENCOUNTER — Other Ambulatory Visit: Payer: Self-pay | Admitting: Cardiovascular Disease

## 2021-05-05 DIAGNOSIS — M792 Neuralgia and neuritis, unspecified: Secondary | ICD-10-CM

## 2021-05-05 DIAGNOSIS — M461 Sacroiliitis, not elsewhere classified: Secondary | ICD-10-CM | POA: Insufficient documentation

## 2021-05-05 DIAGNOSIS — M5136 Other intervertebral disc degeneration, lumbar region: Secondary | ICD-10-CM | POA: Diagnosis not present

## 2021-05-05 DIAGNOSIS — M47816 Spondylosis without myelopathy or radiculopathy, lumbar region: Secondary | ICD-10-CM | POA: Diagnosis not present

## 2021-05-05 DIAGNOSIS — M542 Cervicalgia: Secondary | ICD-10-CM

## 2021-05-05 DIAGNOSIS — M47812 Spondylosis without myelopathy or radiculopathy, cervical region: Secondary | ICD-10-CM | POA: Diagnosis not present

## 2021-05-05 DIAGNOSIS — M4316 Spondylolisthesis, lumbar region: Secondary | ICD-10-CM | POA: Diagnosis not present

## 2021-05-05 DIAGNOSIS — M4312 Spondylolisthesis, cervical region: Secondary | ICD-10-CM | POA: Diagnosis not present

## 2021-05-05 DIAGNOSIS — M4317 Spondylolisthesis, lumbosacral region: Secondary | ICD-10-CM | POA: Diagnosis not present

## 2021-05-05 HISTORY — DX: Spondylosis without myelopathy or radiculopathy, lumbar region: M47.816

## 2021-05-05 HISTORY — DX: Sacroiliitis, not elsewhere classified: M46.1

## 2021-05-05 HISTORY — DX: Cervicalgia: M54.2

## 2021-05-05 HISTORY — DX: Neuralgia and neuritis, unspecified: M79.2

## 2021-05-11 DIAGNOSIS — H524 Presbyopia: Secondary | ICD-10-CM | POA: Diagnosis not present

## 2021-05-11 DIAGNOSIS — I10 Essential (primary) hypertension: Secondary | ICD-10-CM | POA: Diagnosis not present

## 2021-06-01 ENCOUNTER — Ambulatory Visit: Payer: Medicare HMO | Admitting: Cardiovascular Disease

## 2021-06-01 ENCOUNTER — Other Ambulatory Visit: Payer: Self-pay

## 2021-06-01 ENCOUNTER — Encounter: Payer: Self-pay | Admitting: Cardiovascular Disease

## 2021-06-01 DIAGNOSIS — I480 Paroxysmal atrial fibrillation: Secondary | ICD-10-CM | POA: Diagnosis not present

## 2021-06-01 DIAGNOSIS — M47816 Spondylosis without myelopathy or radiculopathy, lumbar region: Secondary | ICD-10-CM | POA: Diagnosis not present

## 2021-06-01 DIAGNOSIS — I10 Essential (primary) hypertension: Secondary | ICD-10-CM

## 2021-06-01 DIAGNOSIS — I77810 Thoracic aortic ectasia: Secondary | ICD-10-CM | POA: Diagnosis not present

## 2021-06-01 DIAGNOSIS — E785 Hyperlipidemia, unspecified: Secondary | ICD-10-CM | POA: Diagnosis not present

## 2021-06-01 DIAGNOSIS — M461 Sacroiliitis, not elsewhere classified: Secondary | ICD-10-CM | POA: Diagnosis not present

## 2021-06-01 MED ORDER — METOPROLOL SUCCINATE ER 25 MG PO TB24: ORAL_TABLET | ORAL | 3 refills | Status: DC

## 2021-06-01 NOTE — Patient Instructions (Addendum)
Medication Instructions:  INCREASE metoprolol to 37.5mg  (1.5 tablets) daily for two weeks, then increase to 50mg  (two tablets) daily.  *If you need a refill on your cardiac medications before your next appointment, please call your pharmacy*   Lab Work: None ordered.    Testing/Procedures: Your physician has requested that you have an echocardiogram. Echocardiography is a painless test that uses sound waves to create images of your heart. It provides your doctor with information about the size and shape of your heart and how well your heart's chambers and valves are working. This procedure takes approximately one hour. There are no restrictions for this procedure.   Follow-Up: At [facility], you and your health needs are our priority.  As part of our continuing mission to provide you with exceptional heart care, we have created designated Provider Care Teams.  These Care Teams include your primary Cardiologist (physician) and Advanced Practice Providers (APPs -  Physician Assistants and Nurse Practitioners) who all work together to provide you with the care you need, when you need it.  We recommend signing up for the patient portal called "MyChart".  Sign up information is provided on this After Visit Summary.  MyChart is used to connect with patients for Virtual Visits (Telemedicine).  Patients are able to view lab/test results, encounter notes, upcoming appointments, etc.  Non-urgent messages can be sent to your provider as well.   To learn more about what you can do with MyChart, go to [email or web address].    Your next appointment:   6 month(s)  The format for your next appointment:   In Person  Provider:   Shelva Majestic, MD

## 2021-06-01 NOTE — Progress Notes (Signed)
Patient ID: Dustin Ferries., male   DOB: 12-Sep-1946, 75 y.o.   MRN: [id number]     HPI: Dustin Finlay. is a 75 y.o. male who presents for an 8 month followup cardiology evaluation.     Dustin Berry has a long-standing history of paroxysmal atrial fibrillation. Initially in 2000 he was evaluated at [facility] and was treated at that time with flecainide. He subsequently developed breakthrough arrhythmia's and had done well with Rythmol SR and can, beta blocker. Remotely, he had seen Dr. Rosita Fire at [facility] as well as Dr. Glennon Mac at [facility] for consideration of atrial fibrillation ablation in 2011 at that time a decision not to have therapy. He had done well until recently but developed recurrent problems with recurrent atrial fibrillation despite medical therapy. He also has been diagnosed with obstructive sleep apnea and has been utilizing CPAP therapy with 100% compliance. He ultimately went back to [facility] and on June 6,2014 underwent pulmonary vein isolation of all pulmonary veins and radiofrequency ablation of CTI with bidirectional block noted with RA and CS pacing by Dr. Glennon Mac. He has been on eliquis 5 mg twice a day for anticoagulation. At that time he was taken off his multaq and metoprolol.  When I saw him in 2014 he started to notice white blood pressure elevation. He was unaware of any recurrent atrial fibrillation. He had been using his CPAP therapy. At that time, his blood pressure was 130/100. I elected to add low-dose lisinopril at 5 mg per day socially underwent a 2-D echo Doppler study on 11/21/2013. This showed an ejection fraction in the 45-50% range with grade 1 diastolic dysfunction. No definitive wall motion abnormalities were detected although the possibility was raised concerning possible mid inferior hypocontractility. He did have systolic bowing without definitive prolapse of his mitral valve with mild MR.    He underwent a one-year follow-up evaluation at [facility]  for his atrial fibrillation ablation.  He was maintaining sinus rhythm.  At that time, his anticoagulation was discontinued and he was resumed on baby aspirin for antiplatelet therapy.  He apparently has stopped using CPAP therapy over the past 3 years.  He denies any issues with his sleep.  He is unaware of any significant recurrent palpitations.  He has been monitoring his blood pressure at home and he states this is typically been running approximately 518 systolically.  He denies chest pain or palpitations.  He has back issues which has limited his exercise such that he predominantly just walks.  He no longer plays basketball.  When I saw him in February 2019 he was in the process of selling his house, and he is building a new house.  In the interim he was notusing his CPAP and that this is in a box preparing for his move.  He also has moved his [place] center out of [employer] to the [place] region.  He is unaware of any recurrent atrial fibrillation.  Occasionally he admitted to palpitations which he senses particularly when he lies on his side.  He underwent a follow-up echo Doppler study in February 2018 which showed an EF of 50-55% with grade 1 diastolic dysfunction.  His aortic root was mildly dilated and measured 4.5 cm.  There was mitral valve prolapse involving the anterior leaflet and posterior leaflet with mild MR.  There was mild biatrial enlargement.  In follow-up of his aortic root increase he underwent CT angios of his chest and aorta which showed a  4.3 cm a sending thoracic aortic aneurysm on 03/02/2017.  He was on lisinopril 5 mg for hypertension, atorvastatin 20 for hyperlipidemia.    I saw him on 08/05/2019 at which time he had remained stable from a cardiac perspective.  He was unaware of any recurrent arrhythmia.  He denied any chest pain. He had noticed his blood pressures slightly elevated and labile with typical blood pressures around 536 systolically.  He is unaware of any  breakthrough atrial fibrillation.    He underwent laboratory with Dr. Jenny Reichmann in February 2020.  Total cholesterol was 149, HDL 40.9, LDL 95, triglycerides 67.  When I last saw him, I recommended titration of lisinopril and apparently subsequent to that evaluation his dose was further titrated to 10 mg daily.  I last saw him in October 2021 and over the prior year he had remained stable from a cardiac standpoint.  He was unaware of any recurrent atrial fibrillation.  However there have been times we has noticed some slight increase in palpitations particularly if he would drink 2 to 3 glasses of caffeinated tea.  With caffeine reduction these have improved.  Recently, he has been trying to eat better.  He continued to work approximately 5 and half days per week at his [employer] which has moved to the [place] area.  He typically walks about [phone] steps per day usually while at work but denies any significant routine exercise.  He does cut his grass and work in his yard on weekends.  Over the past year he underwent right thumb surgery by Dr. Amedeo Plenty.  He denied chest pain or change in exertional symptomatology.  He saw Dr. Jenny Reichmann for his primary care evaluation but no laboratory was obtained this year.  His last blood work was done in February 2020.    Since I last saw him, he has continued to work at his [employer].  He has been having some issues with his SI joint and is recently been evaluated by Dr. Noah Delaine at [facility].  At times he notes an occasional isolated palpitation but at times he may notice brief burst of heart rate irregularity.  He has been taking metoprolol succinate 25 mg daily as well as lisinopril 10 mg for hypertension.  He has been taking meloxicam for his arthritic issues.  He continues to be on atorvastatin 40 mg daily.  He denies any chest pain PND orthopnea.  He denies any shortness of breath.  His blood pressure at home has been running in the upper 130s to 140 range.  He  presents for evaluation.  Past Medical History:  Diagnosis Date   Abdominal pain 04/01/2013   ALLERGIC RHINITIS 05/12/2008   Arthritis    Atrial fibrillation ([place]) 03/15/2010   14 day event monitor - 1 episode of sinus bradycardia   Atrial fibrillation with RVR ([place]) 10/21/2012   Echo- EF 50-55%; mild concentric LVH; flow pattern suggestive of impaired LV relaxation; mild mitral valve prolapse, trace mitral regurgitation   B12 deficiency 06/15/2019   Back pain    receiving PT   Boil of buttock 06/24/11   BPH (benign prostatic hypertrophy) 09/05/2012   Cataract    Chest pain with exertion presumed to be tachycardia related along with SOB 04/01/2013   ESOPHAGEAL STRICTURE 03/24/2009   Heart murmur    Hematuria, possible 04/01/2013   HTN (hypertension) 12/04/2013   HYPERLIPIDEMIA 08/18/2007   Insomnia 06/08/2016   INSOMNIA-SLEEP DISORDER-UNSPEC 05/14/2009   Mitral valve prolapse 07/04/2011  MRSA (methicillin resistant staph aureus) culture positive 5+ years ago   Neuromuscular disorder ([place])    peripheral neuropathy   OSA (obstructive sleep apnea) 07/04/2011   sleep study - average AHI 2.5   Palpitations 03/06/2007   R/P MV - mild perfusion defect in basal inferoseptal, basal inferior, mid inferoseptal, and mid inferior regions, consistent w/ infarct/scar; no scintigraphic evidence of inducible myocardial ischemia; prior non transmural infarct cannot be completely excluded; EF 48%; no significant change from previous study   PERIPHERAL NEUROPATHY 05/12/2008   Personal history of colonic polyps 05/12/2008   Preventative health care 06/24/2011   Sleep apnea    pt denies   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    Past Surgical History:  Procedure Laterality Date   ATRIAL FIBRILLATION ABLATION     CARDIOVERSION N/A 04/03/2013   Procedure: CARDIOVERSION;  Surgeon: Pixie Casino, MD;  Location: [facility] ENDOSCOPY;  Service: Cardiovascular;  Laterality: N/A;   COLONOSCOPY  2018   HAND SURGERY     Thumb  joint repair   POLYPECTOMY     TEE WITHOUT CARDIOVERSION N/A 04/03/2013   Procedure: TRANSESOPHAGEAL ECHOCARDIOGRAM (TEE);  Surgeon: Pixie Casino, MD;  Location: [facility] ENDOSCOPY;  Service: Cardiovascular;  Laterality: N/A;   TONSILLECTOMY AND ADENOIDECTOMY     UPPER GASTROINTESTINAL ENDOSCOPY     dilation    Allergies  Allergen Reactions   Penicillin G Rash   Amoxil [Amoxicillin] Rash    Current Outpatient Medications  Medication Sig Dispense Refill   atorvastatin (LIPITOR) 40 MG tablet TAKE 1 TABLET BY MOUTH EVERY DAY 90 tablet 1   Cyanocobalamin (VITAMIN B 12 PO) Take by mouth.     lisinopril (ZESTRIL) 10 MG tablet Take 10 mg by mouth daily.     meloxicam (MOBIC) 15 MG tablet Take 15 mg by mouth as needed. TAKE 1/2 TABLET AS NEEDED     VITAMIN D PO Take by mouth.     zolpidem (AMBIEN) 5 MG tablet Take 1 tablet (5 mg total) by mouth at bedtime as needed for sleep. 90 tablet 1   metoprolol succinate (TOPROL-XL) 25 MG 24 hr tablet Take 37.20m (1.5 tablets) daily for two weeks, then increase to 525m(2 tablets) daily. 180 tablet 3   No current facility-administered medications for this visit.    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  Tobacco Use   Smoking status: Never   Smokeless tobacco: Never  Vaping Use   Vaping Use: Never used  Substance and Sexual Activity   Alcohol use: Yes    Alcohol/week: 2.0 standard drinks    Types: 2 Standard drinks or equivalent per week   Drug use: No   Sexual activity: Not on file  Other Topics Concern   Not on file  Social History Narrative   Not on file   Social Determinants of Health   Financial Resource Strain: Not on file  Food Insecurity: Not on file  Transportation  Needs: Not on file  Physical Activity: Not on file  Stress: Not on file  Social Connections: Not on file  Intimate Partner Violence: Not on file    Family History  Problem Relation Age of Onset   Heart disease Father [id number]  died with MI   Melanoma Sister    Dementia Mother    Stroke Maternal Grandfather    Breast cancer Maternal Grandmother    Colon cancer Neg Hx    Esophageal cancer Neg Hx    Stomach cancer Neg Hx    Rectal cancer Neg Hx    Colon polyps Neg Hx    Social history is notable in that he is married has 4 children. He never smoked cigarettes. He does try to exercise. Previously had played basketball. 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    ROS General: Negative; No fevers, chills, or night sweats;  HEENT: Negative; No changes in vision or hearing, sinus congestion, difficulty swallowing Pulmonary: Negative; No cough, wheezing, shortness of breath, hemoptysis Cardiovascular: See history of present illness GI: Negative; No nausea, vomiting, diarrhea, or abdominal pain GU: Negative; No dysuria, hematuria, or difficulty voiding Musculoskeletal: Negative; no myalgias, joint pain, or weakness Hematologic/Oncology: Negative; no easy bruising, bleeding Endocrine: Negative; no heat/cold intolerance; no diabetes Neuro: Negative; no changes in balance, headaches Skin: Negative; No rashes or skin lesions Psychiatric: Negative; No behavioral problems, depression Sleep: h/o obstructive sleep apnea treated with CPAP.  Last sleep study in March 2018 showed increased syndrome without significant sleep apnea.  No snoring, daytime sleepiness, hypersomnolence, bruxism, restless legs, hypnogognic hallucinations, no cataplexy Other comprehensive 14 point system review is negative.   PE BP 140/90 (BP Location: Right Arm)   Pulse 69   Ht '6\' 5"'  (1.956 m)   Wt 246 lb (111.6 kg)   BMI 29.17 kg/m    Repeat blood pressure by me was 138/86  Wt Readings from Last  3 Encounters:  06/01/21 246 lb (111.6 kg)  12/09/20 241 lb (109.3 kg)  11/13/20 241 lb (109.3 kg)    General: Alert, oriented, no distress.  Skin: normal turgor, no rashes, warm and dry HEENT: Normocephalic, atraumatic. Pupils equal round and reactive to light; sclera anicteric; extraocular muscles intact;  Nose without nasal septal hypertrophy Mouth/Parynx benign; Mallinpatti scale 3 Neck: No JVD, no carotid bruits; normal carotid upstroke Lungs: clear to ausculatation and percussion; no wheezing or rales Chest wall: without tenderness to palpitation Heart: PMI not displaced, RRR, s1 s2 normal, 1/6 systolic murmur, no diastolic murmur, no rubs, gallops, thrills, or heaves Abdomen: soft, nontender; no hepatosplenomehaly, BS+; abdominal aorta nontender and not dilated by palpation. Back: no CVA tenderness Pulses 2+ Musculoskeletal: full range of motion, normal strength, no joint deformities Extremities: no clubbing cyanosis or edema, Homan's sign negative  Neurologic: grossly nonfocal; Cranial nerves grossly wnl Psychologic: Normal mood and affect   ECG (independently read by me): Normal sinus rhythm at 69 bpm with an isolated PVC.  Nonspecific ST abnormality.  QTc interval 454 ms.  October 2021 ECG (independently read by me): NSR ay 60; NSST changes; QTc 430 msec  August 2020 ECG (independently read by me): Normal sinus rhythm at 62 bpm.  Nonspecific T changes.  QTc interval normal at 414 ms.  February 2019 ECG (independently read by me): Normal sinus rhythm at 70 bpm.  Isolated PVC.  Nonspecific ST changes.  QTc interval 425 ms.  January 2018 ECG (independently read by me): Normal sinus rhythm at 73 bpm.  PR interval 170 ms, QTc interval 423 ms.  Nonspecific ST-T changes.  December 2014 ECG: Normal sinus rhythm at 66 beats per minute. Nonspecific ST-T changes. QTc interval 429 ms.  LABS:  BMP Latest Ref Rng & Units 10/23/2020 09/18/2020 01/09/2019  Glucose 70 - 99 mg/dL 94 98 82   BUN 6 - 23 mg/dL '20 12 12  ' Creatinine 0.40 - 1.50 mg/dL 1.29 1.12 1.04  BUN/Creat Ratio 10 - 24 - 11 -  Sodium 135 - 145 mEq/L 137 140 141  Potassium 3.5 - 5.1 mEq/L 4.4 4.7 4.2  Chloride 96 - 112 mEq/L 103 104 106  CO2 19 - 32 mEq/L '27 24 26  ' Calcium 8.4 - 10.5 mg/dL 9.7 9.7 9.6   Hepatic Function Latest Ref Rng & Units 10/23/2020 09/18/2020 01/09/2019  Total Protein 6.0 - 8.3 g/dL 7.1 6.7 7.0  Albumin 3.5 - 5.2 g/dL 4.3 4.3 4.3  AST 0 - 37 U/L '19 17 16  ' ALT 0 - 53 U/L '21 18 16  ' Alk Phosphatase 39 - 117 U/L 59 70 66  Total Bilirubin 0.2 - 1.2 mg/dL 0.7 0.6 0.9  Bilirubin, Direct 0.0 - 0.3 mg/dL 0.1 - 0.2   CBC Latest Ref Rng & Units 10/23/2020 09/18/2020 01/09/2019  WBC 4.0 - 10.5 K/uL 5.8 3.8 4.7  Hemoglobin 13.0 - 17.0 g/dL 15.4 15.4 15.5  Hematocrit 39.0 - 52.0 % 46.0 45.4 45.8  Platelets 150.0 - 400.0 K/uL 253.0 246 249.0   Lab Results  Component Value Date   MCV 88.0 10/23/2020   MCV 87 09/18/2020   MCV 89.0 01/09/2019   Lab Results  Component Value Date   TSH 3.25 10/23/2020   Lipid Panel     Component Value Date/Time   CHOL 119 10/23/2020 1000   CHOL 178 09/18/2020 1038   TRIG 70.0 10/23/2020 1000   HDL 32.60 (L) 10/23/2020 1000   HDL 40 09/18/2020 1038   CHOLHDL 4 10/23/2020 1000   VLDL 14.0 10/23/2020 1000   LDLCALC 73 10/23/2020 1000   LDLCALC 122 (H) 09/18/2020 1038    RADIOLOGY: No results found.  IMPRESSION:  1. Essential hypertension   2. Aortic root dilatation (HCC)   3. PAF (paroxysmal atrial fibrillation) ([place])   4. Ascending aorta dilation (HCC)   5. Hyperlipidemia, unspecified hyperlipidemia type     ASSESSMENT AND PLAN: Dustin Berry is a 75 year-old Caucasian male who has a history of paroxysmal atrial fibrillation dating back to 2000 when he was initiated on therapy at [facility].  He underwent successful radiofrequency AF ablation with pulmonary vein isolation in June 2014 by Dr. Norm Salt at [facility]. He is maintaining sinus  rhythm.  He had been on eliquis anticoagulation and this was discontinued by his [place] and now he is just on aspirin 81 mg.  An echo Doppler study in  2014 showed an ejection fraction of 45-50%.  The last echo of February 2018  showed an EF of 88-82%, grade 1 diastolic dysfunction, mitral valve prolapse involving both leaflets with mild MR, mild biatrial enlargement, and mild dilation of his ascending aorta at 4.5 cm.  A CT angio of his chest showed a 4.3 cm descending thoracic aortic aneurysm.  Presently, he remains asymptomatic with reference to chest pain.  Most recently he has been on a medical regimen of lisinopril 10 mg admitted metoprolol succinate 25 mg daily for blood pressure control as well as intermittent palpitations.  With his mild blood pressure elevation, I have recommended he increase the metoprolol succinate to 37.5 mg for the next 2 weeks.  He will monitor his heart rate, palpitations, and blood pressure.  If blood pressure is less than 130/80 palpitations have resolved we will continue to 37.5 mg daily.  Otherwise  he will increase this to 50 mg daily.  His last echo Doppler study was in 2018.  I am recommending he undergo a 4-year follow-up evaluation to reassess systolic and diastolic function.  At times he admits to some mild exertional shortness of breath.  This will also be helpful to reassess his previous mild aortic root dilatation.  He has been taking meloxicam for his arthritic issues which may be contributing to his mild blood pressure elevation.  He continues to be on atorvastatin 40 mg daily for hyperlipidemia.  Dr. Cathlean Cower is his primary MD.  Lipid studies in November 2021 showed total cholesterol 119, triglycerides 70, LDL cholesterol was 73.  His HDL cholesterol was low at 32.6.  TSH was normal at 3.25.  He will let us know if increases his metoprolol succinate to 50 mg daily and if so we can  change his prescription to a 50 mg tablet.  I will contact him regarding his  echo Doppler study.  He is sleeping well.  A subsequent sleep study did show improvement with increased upper airway resistance syndrome and only mild sleep apnea with supine position with an AHI of 10.9.  I will see him in 6 months for reevaluation or sooner as needed.    Troy Sine, MD, [facility]  06/01/2021 5:44 PM

## 2021-06-08 MED ORDER — LISINOPRIL 10 MG PO TABS: 10.0000 mg | ORAL_TABLET | Freq: Every day | ORAL | 3 refills | Status: DC

## 2021-06-14 DIAGNOSIS — M461 Sacroiliitis, not elsewhere classified: Secondary | ICD-10-CM | POA: Diagnosis not present

## 2021-06-28 ENCOUNTER — Other Ambulatory Visit: Payer: Self-pay

## 2021-06-28 ENCOUNTER — Ambulatory Visit (HOSPITAL_COMMUNITY): Payer: Medicare HMO | Attending: Cardiovascular Disease

## 2021-06-28 DIAGNOSIS — G4730 Sleep apnea, unspecified: Secondary | ICD-10-CM | POA: Insufficient documentation

## 2021-06-28 DIAGNOSIS — R001 Bradycardia, unspecified: Secondary | ICD-10-CM | POA: Insufficient documentation

## 2021-06-28 DIAGNOSIS — H43813 Vitreous degeneration, bilateral: Secondary | ICD-10-CM | POA: Diagnosis not present

## 2021-06-28 DIAGNOSIS — I4891 Unspecified atrial fibrillation: Secondary | ICD-10-CM | POA: Insufficient documentation

## 2021-06-28 DIAGNOSIS — I080 Rheumatic disorders of both mitral and aortic valves: Secondary | ICD-10-CM | POA: Insufficient documentation

## 2021-06-28 DIAGNOSIS — H35372 Puckering of macula, left eye: Secondary | ICD-10-CM | POA: Diagnosis not present

## 2021-06-28 DIAGNOSIS — H43393 Other vitreous opacities, bilateral: Secondary | ICD-10-CM | POA: Diagnosis not present

## 2021-06-28 DIAGNOSIS — E785 Hyperlipidemia, unspecified: Secondary | ICD-10-CM | POA: Diagnosis not present

## 2021-06-28 DIAGNOSIS — I10 Essential (primary) hypertension: Secondary | ICD-10-CM | POA: Diagnosis not present

## 2021-06-28 DIAGNOSIS — I712 Thoracic aortic aneurysm, without rupture: Secondary | ICD-10-CM | POA: Insufficient documentation

## 2021-06-28 DIAGNOSIS — I77810 Thoracic aortic ectasia: Secondary | ICD-10-CM

## 2021-06-28 LAB — ECHOCARDIOGRAM COMPLETE
Area-P 1/2: 2.96 cm2
MV M vel: 5.51 m/s
MV Peak grad: 121.4 mmHg
P 1/2 time: 674 msec
S' Lateral: 4.3 cm

## 2021-06-30 ENCOUNTER — Other Ambulatory Visit: Payer: Self-pay

## 2021-06-30 DIAGNOSIS — I712 Thoracic aortic aneurysm, without rupture, unspecified: Secondary | ICD-10-CM

## 2021-07-12 ENCOUNTER — Ambulatory Visit (HOSPITAL_COMMUNITY)
Admission: RE | Admit: 2021-07-12 | Discharge: 2021-07-12 | Disposition: A | Discharge status: Home / Self Care | Payer: Medicare HMO | Source: Ambulatory Visit | Attending: Cardiovascular Disease | Admitting: Cardiovascular Disease

## 2021-07-12 ENCOUNTER — Other Ambulatory Visit: Payer: Self-pay

## 2021-07-12 DIAGNOSIS — I712 Thoracic aortic aneurysm, without rupture, unspecified: Secondary | ICD-10-CM

## 2021-07-12 DIAGNOSIS — J9811 Atelectasis: Secondary | ICD-10-CM | POA: Diagnosis not present

## 2021-07-12 MED ORDER — IOHEXOL 350 MG/ML SOLN
100.0000 mL | Freq: Once | INTRAVENOUS | Status: AC | PRN
  Administered 2021-07-12: 75 mL via INTRAVENOUS

## 2021-07-13 LAB — POCT I-STAT CREATININE: Creatinine, Ser: 1.1 mg/dL (ref 0.61–1.24)

## 2021-07-14 ENCOUNTER — Other Ambulatory Visit: Payer: Self-pay

## 2021-07-14 DIAGNOSIS — I712 Thoracic aortic aneurysm, without rupture, unspecified: Secondary | ICD-10-CM

## 2021-07-26 DIAGNOSIS — M461 Sacroiliitis, not elsewhere classified: Secondary | ICD-10-CM | POA: Diagnosis not present

## 2021-07-30 DIAGNOSIS — I7121 Aneurysm of the ascending aorta, without rupture: Secondary | ICD-10-CM | POA: Insufficient documentation

## 2021-08-02 ENCOUNTER — Other Ambulatory Visit: Payer: Self-pay

## 2021-08-02 ENCOUNTER — Institutional Professional Consult (permissible substitution): Payer: Medicare HMO | Admitting: Surgical

## 2021-08-02 VITALS — BP 129/90 | HR 72 | Ht 78.0 in | Wt 236.4 lb

## 2021-08-02 DIAGNOSIS — I712 Thoracic aortic aneurysm, without rupture: Secondary | ICD-10-CM | POA: Diagnosis not present

## 2021-08-02 DIAGNOSIS — I7121 Aneurysm of the ascending aorta, without rupture: Secondary | ICD-10-CM

## 2021-08-02 NOTE — Progress Notes (Signed)
[address]       [place] [address]             [phone]          Chief Complaint  Patient presents with   Thoracic Aortic Aneurysm    New patient consultation chest CTA 8/8/, ECHO 7/25     HPI patient is a 75 year old male we are asked to see in cardiothoracic surgical consultation due to CTA of the chest showing a 4.4 cm aneurysm.  He has a complex medical history including cardiology diagnosis of longstanding history of atrial fibrillation going back several years.  He has had ablation for this.  An echocardiogram done in August of this year showed aortic dilation and this was confirmed by the CT scan.  Please see full reports below.  He has noted to have some reduced left ventricular function with EF at 46%.  He has no aortic valvular disease.     Past Medical History:  Diagnosis Date   Abdominal pain 04/01/2013   ALLERGIC RHINITIS 05/12/2008   Arthritis    Atrial fibrillation ([place]) 03/15/2010   14 day event monitor - 1 episode of sinus bradycardia   Atrial fibrillation with RVR ([place]) 10/21/2012   Echo- EF 50-55%; mild concentric LVH; flow pattern suggestive of impaired LV relaxation; mild mitral valve prolapse, trace mitral regurgitation   B12 deficiency 06/15/2019   Back pain    receiving PT   Boil of buttock 06/24/11   BPH (benign prostatic hypertrophy) 09/05/2012   Cataract    Chest pain with exertion presumed to be tachycardia related along with SOB 04/01/2013   ESOPHAGEAL STRICTURE 03/24/2009   Heart murmur    Hematuria, possible 04/01/2013   HTN (hypertension) 12/04/2013   HYPERLIPIDEMIA 08/18/2007   Insomnia 06/08/2016   INSOMNIA-SLEEP DISORDER-UNSPEC 05/14/2009   Mitral valve prolapse 07/04/2011   MRSA (methicillin resistant staph aureus) culture positive 5+ years ago   Neuromuscular disorder ([place])    peripheral neuropathy   OSA (obstructive sleep apnea) 07/04/2011   sleep study - average AHI 2.5   Palpitations 03/06/2007   R/P MV - mild  perfusion defect in basal inferoseptal, basal inferior, mid inferoseptal, and mid inferior regions, consistent w/ infarct/scar; no scintigraphic evidence of inducible myocardial ischemia; prior non transmural infarct cannot be completely excluded; EF 48%; no significant change from previous study   PERIPHERAL NEUROPATHY 05/12/2008   Personal history of colonic polyps 05/12/2008   Preventative health care 06/24/2011   Sleep apnea    pt denies   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   Past Surgical History:  Procedure Laterality Date   ATRIAL FIBRILLATION ABLATION     CARDIOVERSION N/A 04/03/2013   Procedure: CARDIOVERSION;  Surgeon: Pixie Casino, MD;  Location: [facility] ENDOSCOPY;  Service: Cardiovascular;  Laterality: N/A;   COLONOSCOPY  2018   HAND SURGERY     Thumb joint repair   POLYPECTOMY     TEE WITHOUT CARDIOVERSION N/A 04/03/2013   Procedure: TRANSESOPHAGEAL ECHOCARDIOGRAM (TEE);  Surgeon: Pixie Casino, MD;  Location: [facility] ENDOSCOPY;  Service: Cardiovascular;  Laterality: N/A;   TONSILLECTOMY AND ADENOIDECTOMY     UPPER GASTROINTESTINAL ENDOSCOPY     dilation   Family History  Problem Relation Age of Onset   Heart disease Father 39       died with MI   Melanoma Sister    Dementia Mother    Stroke Maternal Grandfather    Breast cancer Maternal Grandmother  Colon cancer Neg Hx    Esophageal cancer Neg Hx    Stomach cancer Neg Hx    Rectal cancer Neg Hx    Colon polyps Neg Hx     Social History Social History   Tobacco Use   Smoking status: Never   Smokeless tobacco: Never  Vaping Use   Vaping Use: Never used  Substance Use Topics   Alcohol use: Yes    Alcohol/week: 2.0 standard drinks    Types: 2 Standard drinks or equivalent per week   Drug use: No    Current Outpatient Medications  Medication Sig Dispense Refill   atorvastatin (LIPITOR) 40 MG tablet TAKE 1 TABLET BY MOUTH EVERY DAY 90 tablet 1   Cyanocobalamin (VITAMIN B 12 PO) Take by mouth.     lisinopril  (ZESTRIL) 10 MG tablet Take 1 tablet (10 mg total) by mouth daily. 90 tablet 3   meloxicam (MOBIC) 15 MG tablet Take 15 mg by mouth as needed. TAKE 1/2 TABLET AS NEEDED     metoprolol succinate (TOPROL-XL) 25 MG 24 hr tablet Take 37.'5mg'$  (1.5 tablets) daily for two weeks, then increase to '50mg'$  (2 tablets) daily. 180 tablet 3   VITAMIN D PO Take by mouth.     zolpidem (AMBIEN) 5 MG tablet Take 1 tablet (5 mg total) by mouth at bedtime as needed for sleep. 90 tablet 1   No current facility-administered medications for this visit.   Allergies  Allergen Reactions   Penicillin G Rash   Amoxil [Amoxicillin] Rash  CT ANGIO CHEST AORTA W/ & OR WO/CM & GATING ([place] ONLY)  Result Date: 07/12/2021 CLINICAL DATA:  Thoracic aortic aneurysm, follow-up EXAM: CT ANGIOGRAPHY CHEST WITH CONTRAST TECHNIQUE: Multidetector CT imaging of the chest was performed using the standard protocol during bolus administration of intravenous contrast. Multiplanar CT image reconstructions and MIPs were obtained to evaluate the vascular anatomy. CONTRAST:  29m OMNIPAQUE IOHEXOL 350 MG/ML SOLN COMPARISON:  01/10/2019 and previous FINDINGS: Cardiovascular: Heart size upper limits normal. No pericardial effusion. Fair contrast opacification of the pulmonary arterial tree; the exam was not optimized for detection of pulmonary emboli. Patient breathing during the acquisition degrades some images through the lung bases. Scattered coronary calcifications. Good contrast opacification of the thoracic aorta. No evidence of dissection or stenosis. Classic 3 vessel brachiocephalic arterial origin anatomy without proximal stenosis. Aortic Root: --Valve: 3.9 cm --Sinuses: 6 cm --Sinotubular Junction: 3.8 cm Limitations by motion: Moderate Thoracic Aorta: --Ascending Aorta: 4.4 cm (previously 4.5) --Aortic Arch: 4.2 cm --Descending Aorta: 3.4 cm Minimal calcified plaque in the distal descending thoracic aorta. Visualized proximal abdominal aorta  unremarkable. Mediastinum/Nodes: Calcified left hilar and AP window lymph nodes. No mediastinal mass or hematoma. Lungs/Pleura: No pleural effusion. No pneumothorax. Minimal dependent atelectasis posteriorly in the lower lobes. Subcentimeter calcified granuloma in the left upper lobe. Lungs otherwise clear. Upper Abdomen: Bilateral renal angiomyolipomas, incompletely visualized, grossly stable compared to scans dating back to 06/14/2016. Stable right hepatic lobe benign hemangiomas. No acute findings. Musculoskeletal: Anterior vertebral endplate spurring at multiple levels in the mid thoracic spine. No fracture or worrisome bone lesion. Review of the MIP images confirms the above findings. IMPRESSION: 1. Dilated aortic root and thoracic aorta aneurysm as above, without significant change from previous exam. Recommend semi-annual imaging followup by CTA or MRA and referral to cardiothoracic surgery if not already obtained. This recommendation follows 2010 ACCF/AHA/AATS/ACR/ASA/SCA/SCAI/SIR/STS/SVM Guidelines for the Diagnosis and Management of Patients With Thoracic Aortic Disease. Circulation. 2010; 121: e266-e36 2.  Coronary calcifications. The severity of coronary artery disease and any potential stenosis cannot be assessed on this non-gated CT examination. Assessment for potential risk factor modification, dietary therapy or pharmacologic therapy may be warranted, if clinically indicated. Electronically Signed   By: Lucrezia Europe M.D.   On: 07/12/2021 14:19          ECHOCARDIOGRAM REPORT         Patient Name:   Dustin Berry. Date of Exam: 06/28/2021  Medical Rec #:  [id number]             Height:       77.0 in  Accession #:    [id number]            Weight:       246.0 lb  Date of Birth:  [birth year]              BSA:          2.443 m  Patient Age:    35 years              BP:           140/90 mmHg  Patient Gender: M                     HR:           57 bpm.  Exam Location:  [place]    Procedure: 2D Echo, 3D Echo, Cardiac Doppler, Color Doppler and Strain  Analysis                                 MODIFIED REPORT:  This report was modified by Cherlynn Kaiser MD on 06/28/2021 due to  revision.   Indications:     I77.810 Aortic root dilation     History:         Patient has prior history of Echocardiogram examinations,  most                   recent 01/17/2017. Mitral Valve Prolapse,  Arrythmias:Atrial                   Fibrillation and Bradycardia; Risk Factors:Hypertension,  Sleep                   Apnea and Dyslipidemia. Ascending aortic aneurysm.     Sonographer:     Jessee Avers, RDCS  Referring Phys:  [place]  Diagnosing Phys: Cherlynn Kaiser MD   IMPRESSIONS     1. Aortic dilatation noted. Aneurysm of the aortic root with  moderate-severe dilation, measuring 51 mm. There is mild dilatation of the  ascending aorta, measuring 43 mm.   2. Left ventricular ejection fraction by 3D volume is 46 %. The left  ventricle has mildly decreased function. The left ventricle demonstrates  global hypokinesis. Left ventricular diastolic parameters are consistent  with Grade I diastolic dysfunction  (impaired relaxation). The average left ventricular global longitudinal  strain is -17.9 %, borderline normal.   3. Right ventricular systolic function is normal. The right ventricular  size is normal.   4. Mild bileaflet mitral valve prolapse.. The mitral valve is abnormal.  Mild to moderate mitral valve regurgitation. No evidence of mitral  stenosis.   5. The aortic valve is grossly normal. Aortic valve regurgitation is  mild. No aortic stenosis is present.   Conclusion(s)/Recommendation(s): Recommend  ECG gated CTA aorta for further  evaluation of ascending aortic aneurysm with particular attention to the  sinus of Valsalva.   FINDINGS   Left Ventricle: Left ventricular ejection fraction by 3D volume is 46 %.  The left ventricle has mildly decreased  function. The left ventricle  demonstrates global hypokinesis. The average left ventricular global  longitudinal strain is -17.9 %. The left  ventricular internal cavity size was normal in size. There is no left  ventricular hypertrophy. Left ventricular diastolic parameters are  consistent with Grade I diastolic dysfunction (impaired relaxation).   Right Ventricle: The right ventricular size is normal. No increase in  right ventricular wall thickness. Right ventricular systolic function is  normal.   Left Atrium: Left atrial size was normal in size.   Right Atrium: Right atrial size was normal in size.   Pericardium: There is no evidence of pericardial effusion.   Mitral Valve: Mild bileaflet mitral valve prolapse. The mitral valve is  abnormal. Mild to moderate mitral valve regurgitation. No evidence of  mitral valve stenosis.   Tricuspid Valve: The tricuspid valve is normal in structure. Tricuspid  valve regurgitation is not demonstrated. No evidence of tricuspid  stenosis.   Aortic Valve: The aortic valve is grossly normal. Aortic valve  regurgitation is mild. Aortic regurgitation PHT measures 674 msec. No  aortic stenosis is present.   Pulmonic Valve: The pulmonic valve was normal in structure. Pulmonic valve  regurgitation is mild. No evidence of pulmonic stenosis.   Aorta: The aortic root is normal in size and structure and aortic  dilatation noted. There is mild dilatation of the ascending aorta,  measuring 43 mm. There is an aneurysm involving the aortic root measuring  51 mm.   Venous: The inferior vena cava was not well visualized.   IAS/Shunts: The interatrial septum was not well visualized.      LEFT VENTRICLE  PLAX 2D  LVIDd:         5.80 cm         Diastology  LVIDs:         4.30 cm         LV e' medial:    2.63 cm/s  LV PW:         1.00 cm         LV E/e' medial:  8.1  LV IVS:        1.00 cm         LV e' lateral:   2.85 cm/s  LVOT diam:     2.40 cm          LV E/e' lateral: 7.5  LV SV:         64  LV SV Index:   26              2D  LVOT Area:     4.52 cm        Longitudinal                                 Strain                                 2D Strain GLS  -18.2 %                                 (  A2C):                                 2D Strain GLS  -18.3 %                                 (A3C):                                 2D Strain GLS  -17.2 %                                 (A4C):                                 2D Strain GLS  -17.9 %                                 Avg:                                    3D Volume EF                                 LV 3D EF:    Left                                              ventricular                                              ejection                                              fraction by                                              3D volume                                              is 46 %.                                    3D Volume EF:                                 3D EF:  46 %                                 LV EDV:       170 ml                                 LV ESV:       92 ml                                 LV SV:        77 ml   RIGHT VENTRICLE  RV Basal diam:  4.30 cm  RV S prime:     12.80 cm/s  TAPSE (M-mode): 1.9 cm  RVSP:           23.6 mmHg   LEFT ATRIUM             Index       RIGHT ATRIUM           Index  LA diam:        4.20 cm 1.72 cm/m  RA Pressure: 3.00 mmHg  LA Vol (A2C):   80.5 ml 32.96 ml/m RA Area:     19.80 cm  LA Vol (A4C):   51.1 ml 20.92 ml/m RA Volume:   46.90 ml  19.20 ml/m  LA Biplane Vol: 65.2 ml 26.69 ml/m   AORTIC VALVE  LVOT Vmax:   63.70 cm/s  LVOT Vmean:  42.500 cm/s  LVOT VTI:    0.141 m  AI PHT:      674 msec     AORTA  Ao Root diam: 5.10 cm  Ao Asc diam:  4.30 cm   MITRAL VALVE               TRICUSPID VALVE                             TR Peak grad:   20.6 mmHg                             TR Vmax:         227.00 cm/s  MR Peak grad: 121.4 mmHg   Estimated RAP:  3.00 mmHg  MR Mean grad: 76.0 mmHg    RVSP:           23.6 mmHg  MR Vmax:      551.00 cm/s  MR Vmean:     410.0 cm/s   SHUNTS  MV E velocity: 21.30 cm/s  Systemic VTI:  0.14 m  MV A velocity: 46.80 cm/s  Systemic Diam: 2.40 cm  MV E/A ratio:  0.46   Cherlynn Kaiser MD  Electronically signed by Cherlynn Kaiser MD  Signature Date/Time: 06/28/2021/2:37:46 PM       Review of Systems  Cardiovascular:  Positive for palpitations.  Neurological:  Positive for numbness.  Ht '6\' 6"'$  (1.981 m)   BMI 28.43 kg/m  Physical Exam Vitals reviewed.  Constitutional:      Appearance: Normal appearance.  HENT:     Head: Normocephalic.  Cardiovascular:     Rate and Rhythm: Normal rate and regular rhythm.     Heart sounds: Normal heart sounds. No murmur  heard. Pulmonary:     Effort: Pulmonary effort is normal.     Breath sounds: Normal breath sounds.  Neurological:     General: No focal deficit present.     Mental Status: He is alert.  Psychiatric:        Mood and Affect: Mood normal.    Assessment: Ascending aortic aneurysm, 4.4 cm  Plan: Repeat CTA in 6 months.  Heart healthy diet/lifestyle

## 2021-08-02 NOTE — Patient Instructions (Signed)
Heart healthy diet and lifestyle. Monitor BP closely

## 2021-08-08 ENCOUNTER — Other Ambulatory Visit: Payer: Self-pay | Admitting: Internal Medicine

## 2021-08-08 NOTE — Telephone Encounter (Signed)
Ok to contact pt  Ambien 5 mg refill x 1 mo  Please make return office visit for further refills

## 2021-08-10 ENCOUNTER — Other Ambulatory Visit: Payer: Self-pay

## 2021-08-10 MED ORDER — METOPROLOL SUCCINATE ER 50 MG PO TB24: 50.0000 mg | ORAL_TABLET | Freq: Every day | ORAL | 3 refills | Status: DC

## 2021-08-11 DIAGNOSIS — H33322 Round hole, left eye: Secondary | ICD-10-CM | POA: Diagnosis not present

## 2021-08-11 DIAGNOSIS — H43392 Other vitreous opacities, left eye: Secondary | ICD-10-CM | POA: Diagnosis not present

## 2021-08-11 DIAGNOSIS — H43812 Vitreous degeneration, left eye: Secondary | ICD-10-CM | POA: Diagnosis not present

## 2021-08-17 DIAGNOSIS — L02412 Cutaneous abscess of left axilla: Secondary | ICD-10-CM | POA: Diagnosis not present

## 2021-08-17 DIAGNOSIS — L0889 Other specified local infections of the skin and subcutaneous tissue: Secondary | ICD-10-CM | POA: Diagnosis not present

## 2021-08-18 DIAGNOSIS — H43811 Vitreous degeneration, right eye: Secondary | ICD-10-CM | POA: Diagnosis not present

## 2021-09-08 DIAGNOSIS — H43811 Vitreous degeneration, right eye: Secondary | ICD-10-CM | POA: Diagnosis not present

## 2021-09-13 DIAGNOSIS — I4891 Unspecified atrial fibrillation: Secondary | ICD-10-CM | POA: Diagnosis not present

## 2021-09-13 DIAGNOSIS — I351 Nonrheumatic aortic (valve) insufficiency: Secondary | ICD-10-CM | POA: Diagnosis not present

## 2021-09-13 DIAGNOSIS — I10 Essential (primary) hypertension: Secondary | ICD-10-CM | POA: Diagnosis not present

## 2021-09-13 DIAGNOSIS — I7121 Aneurysm of the ascending aorta, without rupture: Secondary | ICD-10-CM | POA: Diagnosis not present

## 2021-09-16 ENCOUNTER — Encounter: Payer: Self-pay | Admitting: Internal Medicine

## 2021-09-16 ENCOUNTER — Encounter: Payer: Medicare HMO | Admitting: Surgery

## 2021-09-17 ENCOUNTER — Encounter: Payer: Medicare HMO | Admitting: Surgery

## 2021-11-08 ENCOUNTER — Encounter: Payer: Self-pay | Admitting: Internal Medicine

## 2021-11-09 ENCOUNTER — Encounter: Payer: Self-pay | Admitting: Cardiovascular Disease

## 2021-11-22 ENCOUNTER — Ambulatory Visit (INDEPENDENT_AMBULATORY_CARE_PROVIDER_SITE_OTHER): Payer: Medicare HMO | Admitting: Internal Medicine

## 2021-11-22 ENCOUNTER — Encounter: Payer: Self-pay | Admitting: Internal Medicine

## 2021-11-22 ENCOUNTER — Other Ambulatory Visit: Payer: Self-pay

## 2021-11-22 ENCOUNTER — Encounter: Payer: Self-pay | Admitting: Cardiovascular Disease

## 2021-11-22 VITALS — BP 152/80 | HR 55 | Temp 97.4°F | Ht 78.0 in | Wt 248.0 lb

## 2021-11-22 DIAGNOSIS — G459 Transient cerebral ischemic attack, unspecified: Secondary | ICD-10-CM | POA: Diagnosis not present

## 2021-11-22 DIAGNOSIS — E7800 Pure hypercholesterolemia, unspecified: Secondary | ICD-10-CM | POA: Diagnosis not present

## 2021-11-22 DIAGNOSIS — I10 Essential (primary) hypertension: Secondary | ICD-10-CM | POA: Diagnosis not present

## 2021-11-22 DIAGNOSIS — E538 Deficiency of other specified B group vitamins: Secondary | ICD-10-CM | POA: Diagnosis not present

## 2021-11-22 DIAGNOSIS — Z0001 Encounter for general adult medical examination with abnormal findings: Secondary | ICD-10-CM

## 2021-11-22 DIAGNOSIS — I700 Atherosclerosis of aorta: Secondary | ICD-10-CM

## 2021-11-22 DIAGNOSIS — E559 Vitamin D deficiency, unspecified: Secondary | ICD-10-CM

## 2021-11-22 HISTORY — DX: Transient cerebral ischemic attack, unspecified: G45.9

## 2021-11-22 LAB — CBC WITH DIFFERENTIAL/PLATELET
Basophils Absolute: 0 10*3/uL (ref 0.0–0.1)
Basophils Relative: 0.7 % (ref 0.0–3.0)
Eosinophils Absolute: 0.3 10*3/uL (ref 0.0–0.7)
Eosinophils Relative: 6 % — ABNORMAL HIGH (ref 0.0–5.0)
HCT: 45.2 % (ref 39.0–52.0)
Hemoglobin: 15 g/dL (ref 13.0–17.0)
Lymphocytes Relative: 32.2 % (ref 12.0–46.0)
Lymphs Abs: 1.5 10*3/uL (ref 0.7–4.0)
MCHC: 33.2 g/dL (ref 30.0–36.0)
MCV: 91.7 fl (ref 78.0–100.0)
Monocytes Absolute: 0.6 10*3/uL (ref 0.1–1.0)
Monocytes Relative: 12.1 % — ABNORMAL HIGH (ref 3.0–12.0)
Neutro Abs: 2.2 10*3/uL (ref 1.4–7.7)
Neutrophils Relative %: 49 % (ref 43.0–77.0)
Platelets: 219 10*3/uL (ref 150.0–400.0)
RBC: 4.93 Mil/uL (ref 4.22–5.81)
RDW: 13.6 % (ref 11.5–15.5)
WBC: 4.6 10*3/uL (ref 4.0–10.5)

## 2021-11-22 LAB — URINALYSIS, ROUTINE W REFLEX MICROSCOPIC
Bilirubin Urine: NEGATIVE
Hgb urine dipstick: NEGATIVE
Ketones, ur: NEGATIVE
Leukocytes,Ua: NEGATIVE
Nitrite: NEGATIVE
Specific Gravity, Urine: 1.02 (ref 1.000–1.030)
Urine Glucose: NEGATIVE
Urobilinogen, UA: 1 (ref 0.0–1.0)
pH: 6.5 (ref 5.0–8.0)

## 2021-11-22 LAB — BASIC METABOLIC PANEL
BUN: 15 mg/dL (ref 6–23)
CO2: 29 mEq/L (ref 19–32)
Calcium: 9.6 mg/dL (ref 8.4–10.5)
Chloride: 108 mEq/L (ref 96–112)
Creatinine, Ser: 1.15 mg/dL (ref 0.40–1.50)
GFR: 62.68 mL/min (ref >60.00)
Glucose, Bld: 90 mg/dL (ref 70–99)
Potassium: 4.3 mEq/L (ref 3.5–5.1)
Sodium: 144 mEq/L (ref 135–145)

## 2021-11-22 LAB — VITAMIN B12: Vitamin B-12: 162 pg/mL — ABNORMAL LOW (ref 211–911)

## 2021-11-22 LAB — LIPID PANEL
Cholesterol: 106 mg/dL (ref 0–200)
HDL: 41.1 mg/dL (ref >39.00)
LDL Cholesterol: 42 mg/dL (ref 0–99)
NonHDL: 64.91
Total CHOL/HDL Ratio: 3
Triglycerides: 115 mg/dL (ref 0.0–149.0)
VLDL: 23 mg/dL (ref 0.0–40.0)

## 2021-11-22 LAB — HEPATIC FUNCTION PANEL
ALT: 22 U/L (ref 0–53)
AST: 19 U/L (ref 0–37)
Albumin: 4 g/dL (ref 3.5–5.2)
Alkaline Phosphatase: 61 U/L (ref 39–117)
Bilirubin, Direct: 0.2 mg/dL (ref 0.0–0.3)
Total Bilirubin: 0.9 mg/dL (ref 0.2–1.2)
Total Protein: 6.5 g/dL (ref 6.0–8.3)

## 2021-11-22 LAB — TSH: TSH: 2.06 u[IU]/mL (ref 0.35–5.50)

## 2021-11-22 LAB — HEMOGLOBIN A1C: Hgb A1c MFr Bld: 5.7 % (ref 4.6–6.5)

## 2021-11-22 LAB — VITAMIN D 25 HYDROXY (VIT D DEFICIENCY, FRACTURES): VITD: 30.9 ng/mL (ref 30.00–100.00)

## 2021-11-22 LAB — PSA: PSA: 0.83 ng/mL (ref 0.10–4.00)

## 2021-11-22 MED ORDER — ASPIRIN 81 MG PO TBEC: 81.0000 mg | DELAYED_RELEASE_TABLET | Freq: Every day | ORAL | 12 refills | Status: AC

## 2021-11-22 MED ORDER — ATORVASTATIN CALCIUM 80 MG PO TABS: 80.0000 mg | ORAL_TABLET | Freq: Every day | ORAL | 3 refills | Status: DC

## 2021-11-22 NOTE — Patient Instructions (Signed)
Please start Aspirin 81 mg - (enteric coated)  - 1 per day (indefinitely)  Ok to increase the lipitor to 80 mg per day  Please continue all other medications as before, and refills have been done if requested.  Please have the pharmacy call with any other refills you may need.  Please continue your efforts at being more active, low cholesterol diet, and weight control.  You are otherwise up to date with prevention measures today.  Please keep your appointments with your specialists as you may have planned  You will be contacted regarding the referral for: MRI brain, Echocardiogram, and Carotid artery testing  You will be contacted regarding the referral for: Dr Leonie Man - stroke clinic  Please go to the LAB at the blood drawing area for the tests to be done  You will be contacted by phone if any changes need to be made immediately.  Otherwise, you will receive a letter about your results with an explanation, but please check with MyChart first.  Please remember to sign up for MyChart if you have not done so, as this will be important to you in the future with finding out test results, communicating by private email, and scheduling acute appointments online when needed.  Please make an Appointment to return in 6 months, or sooner if needed

## 2021-11-22 NOTE — Progress Notes (Signed)
Patient ID: Dustin Berry., male   DOB: 30-Nov-1946, 75 y.o.   MRN: [id number]         Chief Complaint:: wellness exam and Office Visit (Possible mini-stroke) , htn      HPI:  Dustin Berry. is a 75 y.o. male here for wellness exam; already up to to date.                          Also here with c/o episode 2 wks ago of 3 minutes double vision where he could not read the computer screen while seated at work, seemed to occur discretely without recurrence, no HA and no other new focal neuro s/s.  Pt denies chest pain, increased sob or doe, wheezing, orthopnea, PND, increased LE swelling, palpitations, dizziness or syncope.   Pt denies polydipsia, polyuria, and overall good med compliance.  Not taking asa or other anticoagulant.  Has hx of PAF s/p ablation.  BP has been better controlled at home but not checked recently, does not want further med for this.   Pt denies fever, wt loss, night sweats, loss of appetite, or other constitutional symptoms     Wt Readings from Last 3 Encounters:  11/22/21 248 lb (112.5 kg)  08/02/21 236 lb 6.4 oz (107.2 kg)  06/01/21 246 lb (111.6 kg)   BP Readings from Last 3 Encounters:  11/22/21 (!) 152/80  08/02/21 129/90  06/01/21 140/90   Immunization History  Administered Date(s) Administered   Influenza Split 09/05/2012   Influenza, High Dose Seasonal PF 09/05/2018   Influenza, Quadrivalent, Recombinant, Inj, Pf 09/02/2019   Influenza-Unspecified 11/22/2017   Moderna Sars-Covid-2 Vaccination 01/27/2020, 02/24/2020   Pneumococcal Conjugate-13 04/29/2014   Pneumococcal Polysaccharide-23 09/05/2012   Td 05/14/2009   Tdap 06/12/2019   Zoster Recombinat (Shingrix) 05/05/2018, 07/05/2018   Zoster, Live 07/10/2009   There are no preventive care reminders to display for this patient.     Past Medical History:  Diagnosis Date   Abdominal pain 04/01/2013   ALLERGIC RHINITIS 05/12/2008   Arthritis    Atrial fibrillation ([place]) 03/15/2010   14 day  event monitor - 1 episode of sinus bradycardia   Atrial fibrillation with RVR ([place]) 10/21/2012   Echo- EF 50-55%; mild concentric LVH; flow pattern suggestive of impaired LV relaxation; mild mitral valve prolapse, trace mitral regurgitation   B12 deficiency 06/15/2019   Back pain    receiving PT   Boil of buttock 06/24/11   BPH (benign prostatic hypertrophy) 09/05/2012   Cataract    Chest pain with exertion presumed to be tachycardia related along with SOB 04/01/2013   ESOPHAGEAL STRICTURE 03/24/2009   Heart murmur    Hematuria, possible 04/01/2013   HTN (hypertension) 12/04/2013   HYPERLIPIDEMIA 08/18/2007   Insomnia 06/08/2016   INSOMNIA-SLEEP DISORDER-UNSPEC 05/14/2009   Mitral valve prolapse 07/04/2011   MRSA (methicillin resistant staph aureus) culture positive 5+ years ago   Neuromuscular disorder ([place])    peripheral neuropathy   OSA (obstructive sleep apnea) 07/04/2011   sleep study - average AHI 2.5   Palpitations 03/06/2007   R/P MV - mild perfusion defect in basal inferoseptal, basal inferior, mid inferoseptal, and mid inferior regions, consistent w/ infarct/scar; no scintigraphic evidence of inducible myocardial ischemia; prior non transmural infarct cannot be completely excluded; EF 48%; no significant change from previous study   PERIPHERAL NEUROPATHY 05/12/2008   Personal history of colonic polyps 05/12/2008   Preventative health care 06/24/2011  Sleep apnea    pt denies   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   Past Surgical History:  Procedure Laterality Date   ATRIAL FIBRILLATION ABLATION     CARDIOVERSION N/A 04/03/2013   Procedure: CARDIOVERSION;  Surgeon: Pixie Casino, MD;  Location: [facility];  Service: Cardiovascular;  Laterality: N/A;   COLONOSCOPY  2018   HAND SURGERY     Thumb joint repair   POLYPECTOMY     TEE WITHOUT CARDIOVERSION N/A 04/03/2013   Procedure: TRANSESOPHAGEAL ECHOCARDIOGRAM (TEE);  Surgeon: Pixie Casino, MD;  Location: [facility] ENDOSCOPY;  Service:  Cardiovascular;  Laterality: N/A;   TONSILLECTOMY AND ADENOIDECTOMY     UPPER GASTROINTESTINAL ENDOSCOPY     dilation    reports that he has never smoked. He has never used smokeless tobacco. He reports current alcohol use of about 2.0 standard drinks per week. He reports that he does not use drugs. family history includes Breast cancer in his maternal grandmother; Dementia in his mother; Heart disease (age of onset: 73) in his father; Melanoma in his sister; Stroke in his maternal grandfather. Allergies  Allergen Reactions   Penicillin G Rash   Quinolones     Other reaction(s): Other (See Comments) Fluroquinolone antibiotics should be avoided in patients with history of aortic aneurysm/dissection   Amoxil [Amoxicillin] Rash   Current Outpatient Medications on File Prior to Visit  Medication Sig Dispense Refill   Cyanocobalamin (VITAMIN B 12 PO) Take by mouth.     lisinopril (ZESTRIL) 10 MG tablet Take 1 tablet (10 mg total) by mouth daily. 90 tablet 3   meloxicam (MOBIC) 15 MG tablet Take 10 mg by mouth as needed. TAKE 1/2 TABLET AS NEEDED     metoprolol succinate (TOPROL-XL) 50 MG 24 hr tablet Take 1 tablet (50 mg total) by mouth daily. Take 37.5mg  (1.5 tablets) daily for two weeks, then increase to 50mg  (2 tablets) daily. 90 tablet 3   VITAMIN D PO Take by mouth.     zolpidem (AMBIEN) 5 MG tablet TAKE 1 TABLET BY MOUTH AT BEDTIME AS NEEDED FOR SLEEP. 30 tablet 0   No current facility-administered medications on file prior to visit.        ROS:  All others reviewed and negative.  Objective        PE:  BP (!) 152/80 (BP Location: Right Arm, Patient Position: Sitting, Cuff Size: Large)    Pulse (!) 55    Temp (!) 97.4 F (36.3 C) (Oral)    Ht 6\' 6"  (1.981 m)    Wt 248 lb (112.5 kg)    SpO2 98%    BMI 28.66 kg/m                 Constitutional: Pt appears in NAD               HENT: Head: NCAT.                Right Ear: External ear normal.                 Left Ear: External ear  normal.                Eyes: . Pupils are equal, round, and reactive to light. Conjunctivae and EOM are normal               Nose: without d/c or deformity               Neck: Neck supple. Johney Maine  normal ROM               Cardiovascular: Normal rate and regular rhythm.                 Pulmonary/Chest: Effort normal and breath sounds without rales or wheezing.                Abd:  Soft, NT, ND, + BS, no organomegaly               Neurological: Pt is alert. At baseline orientation, motor grossly intact               Skin: Skin is warm. No rashes, no other new lesions, LE edema - none               Psychiatric: Pt behavior is normal without agitation   Micro: none  Cardiac tracings I have personally interpreted today:  none  Pertinent Radiological findings (summarize): none   Lab Results  Component Value Date   WBC 4.6 11/22/2021   HGB 15.0 11/22/2021   HCT 45.2 11/22/2021   PLT 219.0 11/22/2021   GLUCOSE 90 11/22/2021   CHOL 106 11/22/2021   TRIG 115.0 11/22/2021   HDL 41.10 11/22/2021   LDLCALC 42 11/22/2021   ALT 22 11/22/2021   AST 19 11/22/2021   NA 144 11/22/2021   K 4.3 11/22/2021   CL 108 11/22/2021   CREATININE 1.15 11/22/2021   BUN 15 11/22/2021   CO2 29 11/22/2021   TSH 2.06 11/22/2021   PSA 0.83 11/22/2021   INR 1.19 04/01/2013   HGBA1C 5.7 11/22/2021   Assessment/Plan:  Dustin Berry. is a 75 y.o. White or Caucasian [1] male with  has a past medical history of Abdominal pain (04/01/2013), ALLERGIC RHINITIS (05/12/2008), Arthritis, Atrial fibrillation ([place]) (03/15/2010), Atrial fibrillation with RVR ([place]) (10/21/2012), B12 deficiency (06/15/2019), Back pain, Boil of buttock (06/24/11), BPH (benign prostatic hypertrophy) (09/05/2012), Cataract, Chest pain with exertion presumed to be tachycardia related along with SOB (04/01/2013), ESOPHAGEAL STRICTURE (03/24/2009), Heart murmur, Hematuria, possible (04/01/2013), HTN (hypertension) (12/04/2013), HYPERLIPIDEMIA  (08/18/2007), Insomnia (06/08/2016), INSOMNIA-SLEEP DISORDER-UNSPEC (05/14/2009), Mitral valve prolapse (07/04/2011), MRSA (methicillin resistant staph aureus) culture positive (5+ years ago), Neuromuscular disorder ([place]), OSA (obstructive sleep apnea) (07/04/2011), Palpitations (03/06/2007), PERIPHERAL NEUROPATHY (05/12/2008), Personal history of colonic polyps (05/12/2008), Preventative health care (06/24/2011), Sleep apnea, and Tuberous sclerosis ([place]) (06/08/2016).  Encounter for well adult exam with abnormal findings Age and sex appropriate education and counseling updated with regular exercise and diet Referrals for preventative services - none needed Immunizations addressed - none needed Smoking counseling  - none needed Evidence for depression or other mood disorder - none significant Most recent labs reviewed. I have personally reviewed and have noted: 1) the patient's medical and social history 2) The patient's current medications and supplements 3) The patient's height, weight, and BMI have been recorded in the chart   Aortic atherosclerosis (HCC) Pt to continiue low chol diet, exercise, statin  TIA (transient ischemic attack) Occurred x 2 wks ago, family asked him to come in, no recurrence, current exam benign, today for start ASA 81 mg daily, increased lipitor 80 qd, for Head MRI, echo, carotids and refer stroke clinic  HLD (hyperlipidemia) Lab Results  Component Value Date   LDLCALC 42 11/22/2021   Stable, pt for high dose statin   HTN (hypertension) Mild uncontrolled today, pt to continue to monitor at home and next visit  B12 deficiency Lab Results  Component Value  Date   VITAMINB12 162 (L) 11/22/2021   Low, to start oral replacement - b12 1000 mcg qd  Followup: Return in about 6 months (around 05/23/2022).  Cathlean Cower, MD 11/23/2021 5:00 AM [facility]

## 2021-11-23 ENCOUNTER — Encounter: Payer: Self-pay | Admitting: Internal Medicine

## 2021-11-23 NOTE — Assessment & Plan Note (Signed)

## 2021-11-23 NOTE — Assessment & Plan Note (Signed)
Pt to continiue low chol diet, exercise, statin

## 2021-11-23 NOTE — Assessment & Plan Note (Signed)
Occurred x 2 wks ago, family asked him to come in, no recurrence, current exam benign, today for start ASA 81 mg daily, increased lipitor 80 qd, for Head MRI, echo, carotids and refer stroke clinic

## 2021-11-23 NOTE — Assessment & Plan Note (Signed)
Mild uncontrolled today, pt to continue to monitor at home and next visit

## 2021-11-23 NOTE — Assessment & Plan Note (Signed)
Lab Results  Component Value Date   LDLCALC 42 11/22/2021   Stable, pt for high dose statin

## 2021-11-23 NOTE — Assessment & Plan Note (Signed)
Lab Results  Component Value Date   VITAMINB12 162 (L) 11/22/2021   Low, to start oral replacement - b12 1000 mcg qd

## 2021-12-01 ENCOUNTER — Other Ambulatory Visit: Payer: Self-pay | Admitting: Cardiovascular Disease

## 2021-12-17 ENCOUNTER — Other Ambulatory Visit: Payer: Self-pay | Admitting: Surgery

## 2021-12-17 DIAGNOSIS — I7121 Aneurysm of the ascending aorta, without rupture: Secondary | ICD-10-CM

## 2021-12-18 ENCOUNTER — Other Ambulatory Visit: Payer: Medicare HMO

## 2021-12-27 ENCOUNTER — Encounter (HOSPITAL_COMMUNITY): Payer: Self-pay | Admitting: Internal Medicine

## 2021-12-29 ENCOUNTER — Ambulatory Visit
Admission: RE | Admit: 2021-12-29 | Discharge: 2021-12-29 | Disposition: A | Discharge status: Home / Self Care | Payer: Medicare HMO | Source: Ambulatory Visit | Attending: Internal Medicine | Admitting: Internal Medicine

## 2021-12-29 ENCOUNTER — Other Ambulatory Visit: Payer: Self-pay

## 2021-12-29 DIAGNOSIS — G459 Transient cerebral ischemic attack, unspecified: Secondary | ICD-10-CM

## 2021-12-29 DIAGNOSIS — I739 Peripheral vascular disease, unspecified: Secondary | ICD-10-CM | POA: Diagnosis not present

## 2022-01-03 ENCOUNTER — Encounter: Payer: Self-pay | Admitting: Neurology

## 2022-01-03 ENCOUNTER — Telehealth: Payer: Self-pay | Admitting: Neurology

## 2022-01-03 ENCOUNTER — Ambulatory Visit: Payer: Medicare HMO | Admitting: Neurology

## 2022-01-03 ENCOUNTER — Other Ambulatory Visit: Payer: Self-pay

## 2022-01-03 VITALS — BP 138/87 | HR 55 | Ht 77.0 in | Wt 245.0 lb

## 2022-01-03 DIAGNOSIS — G459 Transient cerebral ischemic attack, unspecified: Secondary | ICD-10-CM

## 2022-01-03 MED ORDER — PREGABALIN 75 MG PO CAPS: 75.0000 mg | ORAL_CAPSULE | Freq: Every evening | ORAL | 3 refills | Status: DC

## 2022-01-03 NOTE — Telephone Encounter (Signed)
[employer] medicare order sent to [facility], they will obtain the auth and reach out to the patient to schedule.

## 2022-01-03 NOTE — Patient Instructions (Addendum)
CTA Head and neck  Continue current medications  Continue with B 12 supplement  Return in 6 to 8 weeks, at that time, we will discuss more in dept about your memory problems.

## 2022-01-03 NOTE — Progress Notes (Signed)
[facility]  PATIENT: Dustin Berry. DOB: [birth year]  REQUESTING CLINICIAN: Biagio Borg, MD HISTORY FROM: Patient and Spouse  REASON FOR VISIT: TIA/ Peripheral neuropathy    HISTORICAL  CHIEF COMPLAINT:  Chief Complaint  Patient presents with   New Patient (Initial Visit)    Rm 13,  with wife, pt states he is stable      HISTORY OF PRESENT ILLNESS:  This is a 76 year old gentleman with past medical history of hypertension hyperlipidemia, previously atrial fibrillation status post ablation who was referred by PMD for TIA work up.  Patient reports 6 weeks ago while at a computer desk he noted  binocular double vision, on closing 1 eye his vision will be back to normal but with both eyes he will have double vision.  The entire episode lasted about 3 minutes then resolved.  There were no associated symptoms with the blurry vision, denies any headaches, denies any numbness, no weakness and no slurred speech.  He never experienced an episode like that in the past and has not had any additional episodes.  He did follow-up with his primary care doctor who obtained a stroke labs and refer him to neurology for TIA work-up.  His lipid panel was within normal limits and a hemoglobin A1c was 5.7.  He denies any previous history of strokes.     OTHER MEDICAL CONDITIONS: HLD, HTN, CAD   REVIEW OF SYSTEMS: Full 14 system review of systems performed and negative with exception of: as noted in the HPI   ALLERGIES: Allergies  Allergen Reactions   Penicillin G Rash   Quinolones     Other reaction(s): Other (See Comments) Fluroquinolone antibiotics should be avoided in patients with history of aortic aneurysm/dissection   Amoxil [Amoxicillin] Rash    HOME MEDICATIONS: Outpatient Medications Prior to Visit  Medication Sig Dispense Refill   aspirin 81 MG EC tablet Take 1 tablet (81 mg total) by mouth daily. Swallow whole. 30 tablet 12   atorvastatin (LIPITOR) 80 MG  tablet Take 1 tablet (80 mg total) by mouth daily. 90 tablet 3   Cyanocobalamin (VITAMIN B 12 PO) Take by mouth.     lisinopril (ZESTRIL) 10 MG tablet Take 1 tablet (10 mg total) by mouth daily. 90 tablet 3   meloxicam (MOBIC) 15 MG tablet Take 10 mg by mouth as needed. TAKE 1/2 TABLET AS NEEDED     metoprolol succinate (TOPROL-XL) 50 MG 24 hr tablet Take 1 tablet (50 mg total) by mouth daily. Take 37.5mg  (1.5 tablets) daily for two weeks, then increase to 50mg  (2 tablets) daily. 90 tablet 3   VITAMIN D PO Take by mouth.     zolpidem (AMBIEN) 5 MG tablet TAKE 1 TABLET BY MOUTH AT BEDTIME AS NEEDED FOR SLEEP. 30 tablet 0   No facility-administered medications prior to visit.    PAST MEDICAL HISTORY: Past Medical History:  Diagnosis Date   Abdominal pain 04/01/2013   ALLERGIC RHINITIS 05/12/2008   Arthritis    Atrial fibrillation ([place]) 03/15/2010   14 day event monitor - 1 episode of sinus bradycardia   Atrial fibrillation with RVR ([place]) 10/21/2012   Echo- EF 50-55%; mild concentric LVH; flow pattern suggestive of impaired LV relaxation; mild mitral valve prolapse, trace mitral regurgitation   B12 deficiency 06/15/2019   Back pain    receiving PT   Boil of buttock 06/24/11   BPH (benign prostatic hypertrophy) 09/05/2012   Cataract    Chest pain with exertion  presumed to be tachycardia related along with SOB 04/01/2013   ESOPHAGEAL STRICTURE 03/24/2009   Heart murmur    Hematuria, possible 04/01/2013   HTN (hypertension) 12/04/2013   HYPERLIPIDEMIA 08/18/2007   Insomnia 06/08/2016   INSOMNIA-SLEEP DISORDER-UNSPEC 05/14/2009   Mitral valve prolapse 07/04/2011   MRSA (methicillin resistant staph aureus) culture positive 5+ years ago   Neuromuscular disorder ([place])    peripheral neuropathy   OSA (obstructive sleep apnea) 07/04/2011   sleep study - average AHI 2.5   Palpitations 03/06/2007   R/P MV - mild perfusion defect in basal inferoseptal, basal inferior, mid inferoseptal, and mid inferior  regions, consistent w/ infarct/scar; no scintigraphic evidence of inducible myocardial ischemia; prior non transmural infarct cannot be completely excluded; EF 48%; no significant change from previous study   PERIPHERAL NEUROPATHY 05/12/2008   Personal history of colonic polyps 05/12/2008   Preventative health care 06/24/2011   Sleep apnea    pt denies   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    PAST SURGICAL HISTORY: Past Surgical History:  Procedure Laterality Date   ATRIAL FIBRILLATION ABLATION     CARDIOVERSION N/A 04/03/2013   Procedure: CARDIOVERSION;  Surgeon: Pixie Casino, MD;  Location: [facility] ENDOSCOPY;  Service: Cardiovascular;  Laterality: N/A;   COLONOSCOPY  2018   HAND SURGERY     Thumb joint repair   POLYPECTOMY     TEE WITHOUT CARDIOVERSION N/A 04/03/2013   Procedure: TRANSESOPHAGEAL ECHOCARDIOGRAM (TEE);  Surgeon: Pixie Casino, MD;  Location: [facility] ENDOSCOPY;  Service: Cardiovascular;  Laterality: N/A;   TONSILLECTOMY AND ADENOIDECTOMY     UPPER GASTROINTESTINAL ENDOSCOPY     dilation    FAMILY HISTORY: Family History  Problem Relation Age of Onset   Heart disease Father 5       died with MI   Melanoma Sister    Dementia Mother    Stroke Maternal Grandfather    Breast cancer Maternal Grandmother    Colon cancer Neg Hx    Esophageal cancer Neg Hx    Stomach cancer Neg Hx    Rectal cancer Neg Hx    Colon polyps Neg Hx     SOCIAL HISTORY: Social History   Socioeconomic History   Marital status: Married    Spouse name: Not on file   Number of children: Not on file   Years of education: Not on file   Highest education level: Not on file  Occupational History   Occupation: semi-retired Tourist information centre manager, former self employed tech co support  Tobacco Use   Smoking status: Never   Smokeless tobacco: Never  Vaping Use   Vaping Use: Never used  Substance and Sexual Activity   Alcohol use: Yes    Alcohol/week: 2.0 standard drinks    Types: 2 Standard drinks or  equivalent per week   Drug use: No   Sexual activity: Not on file  Other Topics Concern   Not on file  Social History Narrative   Not on file   Social Determinants of Health   Financial Resource Strain: Not on file  Food Insecurity: Not on file  Transportation Needs: Not on file  Physical Activity: Not on file  Stress: Not on file  Social Connections: Not on file  Intimate Partner Violence: Not on file     PHYSICAL EXAM  GENERAL EXAM/CONSTITUTIONAL: Vitals:  Vitals:   01/03/22 1108  BP: 138/87  Pulse: (!) 55  Weight: 245 lb (111.1 kg)  Height: 6\' 5"  (1.956 m)   Body mass index  is 29.05 kg/m. Wt Readings from Last 3 Encounters:  01/03/22 245 lb (111.1 kg)  11/22/21 248 lb (112.5 kg)  08/02/21 236 lb 6.4 oz (107.2 kg)   Patient is in no distress; well developed, nourished and groomed; neck is supple  CARDIOVASCULAR: Examination of carotid arteries is normal; no carotid bruits Regular rate and rhythm, no murmurs Examination of peripheral vascular system by observation and palpation is normal  EYES: Pupils round and reactive to light, Visual fields full to confrontation, Extraocular movements intacts,   MUSCULOSKELETAL: Gait, strength, tone, movements noted in Neurologic exam below  NEUROLOGIC: MENTAL STATUS:  No flowsheet data found. awake, alert, oriented to person, place and time recent and remote memory intact normal attention and concentration language fluent, comprehension intact, naming intact fund of knowledge appropriate  CRANIAL NERVE:  2nd, 3rd, 4th, 6th - pupils equal and reactive to light, visual fields full to confrontation, extraocular muscles intact, no nystagmus 5th - facial sensation symmetric 7th - facial strength symmetric 8th - hearing intact 9th - palate elevates symmetrically, uvula midline 11th - shoulder shrug symmetric 12th - tongue protrusion midline  MOTOR:  normal bulk and tone, full strength in the BUE, BLE  SENSORY:   normal and symmetric to light touch, pinprick, temperature, vibration  COORDINATION:  finger-nose-finger, fine finger movements normal  REFLEXES:  deep tendon reflexes present and symmetric  GAIT/STATION:  normal     DIAGNOSTIC DATA (LABS, IMAGING, TESTING) - I reviewed patient records, labs, notes, testing and imaging myself where available.  Lab Results  Component Value Date   WBC 4.6 11/22/2021   HGB 15.0 11/22/2021   HCT 45.2 11/22/2021   MCV 91.7 11/22/2021   PLT 219.0 11/22/2021      Component Value Date/Time   NA 144 11/22/2021 1110   NA 140 09/18/2020 1038   K 4.3 11/22/2021 1110   CL 108 11/22/2021 1110   CO2 29 11/22/2021 1110   GLUCOSE 90 11/22/2021 1110   BUN 15 11/22/2021 1110   BUN 12 09/18/2020 1038   CREATININE 1.15 11/22/2021 1110   CREATININE 1.13 03/01/2017 0001   CALCIUM 9.6 11/22/2021 1110   PROT 6.5 11/22/2021 1110   PROT 6.7 09/18/2020 1038   ALBUMIN 4.0 11/22/2021 1110   ALBUMIN 4.3 09/18/2020 1038   AST 19 11/22/2021 1110   ALT 22 11/22/2021 1110   ALKPHOS 61 11/22/2021 1110   BILITOT 0.9 11/22/2021 1110   BILITOT 0.6 09/18/2020 1038   GFRNONAA 65 09/18/2020 1038   GFRAA 75 09/18/2020 1038   Lab Results  Component Value Date   CHOL 106 11/22/2021   HDL 41.10 11/22/2021   LDLCALC 42 11/22/2021   TRIG 115.0 11/22/2021   CHOLHDL 3 11/22/2021   Lab Results  Component Value Date   HGBA1C 5.7 11/22/2021   Lab Results  Component Value Date   VITAMINB12 162 (L) 11/22/2021   Lab Results  Component Value Date   TSH 2.06 11/22/2021    MRI Brain wo contrast 12/29/2021 1. No acute intracranial pathology.  No evidence of recent infarct. 2. Mild parenchymal volume loss and chronic white matter microangiopathy   ASSESSMENT AND PLAN  76 y.o. year old male with atrial fibrillation status post ablation, hypertension, hyperlipidemia who is presenting after a 3-minute episode of binocular double vision.  There were no associated  symptoms with double vision, denies any slurred speech, no headaches, no weakness and no numbness.  Patient likely had a TIA, I will complete the work-up by obtaining a  CT angiogram head and neck.  His lipid panel was within normal limit and his hemoglobin A1c was 5.7.  He is currently on atorvastatin, aspirin and lisinopril.  I will have patient continue all medications, will contact him after the completion of the CTA head and neck.  His wife did brought up the issue of memory, reports that he is forgetful and mother was diagnosed with Alzheimer dementia, I will see them in 6 to 8 weeks for formal memory consultation.  They are agreeable with plans. He also reports peripheral neuropathy, reported he was diagnosed with non diabetic peripheral neuropathy for more than 5 years.  He does complain of pain at night, currently is not taking any medications for the pain.  I will start him on pregabalin 75 mg nightly and patient will update me on the control of the symptoms.  If 75 mg not enough to control his symptoms, we we will increase it to 75 mg twice daily up to 150 twice daily as tolerated.   1. TIA (transient ischemic attack)      Patient Instructions  CTA Head and neck  Continue current medications  Continue with B 12 supplement  Return in 6 to 8 weeks, at that time, we will discuss more in dept about your memory problems.    Orders Placed This Encounter  Procedures   CT ANGIO HEAD W OR WO CONTRAST   CT ANGIO NECK W OR WO CONTRAST    Meds ordered this encounter  Medications   pregabalin (LYRICA) 75 MG capsule    Sig: Take 1 capsule (75 mg total) by mouth at bedtime.    Dispense:  30 capsule    Refill:  3    Return in about 6 weeks (around 02/14/2022).    Alric Ran, MD 01/03/2022, 2:36 PM  [facility] [address] [place], [place] [address] [phone]

## 2022-01-04 ENCOUNTER — Telehealth: Payer: Self-pay

## 2022-01-04 NOTE — Telephone Encounter (Signed)
I have submitted a PA request for Pregabalin 75mg  on CMM, Key: [id number]. Awaiting determination from [place].

## 2022-01-04 NOTE — Telephone Encounter (Signed)
This approval authorizes your coverage from 12/05/2021 - 12/04/2022

## 2022-01-06 ENCOUNTER — Telehealth (HOSPITAL_COMMUNITY): Payer: Self-pay | Admitting: Internal Medicine

## 2022-01-06 NOTE — Telephone Encounter (Signed)
Just an FYI. We have made several attempts to contact this patient including sending a letter to schedule or reschedule their echocardiogram. We will be removing the patient from the echo WQ.  MAILED LETTER [facility]  12/27/21 Call could not be completed @ 11am-[facility]  12/24/21 [facility] to schedule @ 3:37/[facility]W  12/08/21 [facility] to schedule @ 9:28/LBW       Thank you

## 2022-01-07 ENCOUNTER — Encounter: Payer: Self-pay | Admitting: Neurology

## 2022-01-17 ENCOUNTER — Encounter: Payer: Self-pay | Admitting: Cardiovascular Disease

## 2022-01-17 DIAGNOSIS — H35372 Puckering of macula, left eye: Secondary | ICD-10-CM | POA: Diagnosis not present

## 2022-01-17 DIAGNOSIS — H43391 Other vitreous opacities, right eye: Secondary | ICD-10-CM | POA: Diagnosis not present

## 2022-01-17 DIAGNOSIS — H31092 Other chorioretinal scars, left eye: Secondary | ICD-10-CM | POA: Diagnosis not present

## 2022-01-17 DIAGNOSIS — H43811 Vitreous degeneration, right eye: Secondary | ICD-10-CM | POA: Diagnosis not present

## 2022-01-17 NOTE — Telephone Encounter (Signed)
Forward to sleep coordinator  for evaluation of new equipment

## 2022-01-18 ENCOUNTER — Telehealth: Payer: Self-pay | Admitting: *Deleted

## 2022-01-18 DIAGNOSIS — L820 Inflamed seborrheic keratosis: Secondary | ICD-10-CM | POA: Diagnosis not present

## 2022-01-18 DIAGNOSIS — D485 Neoplasm of uncertain behavior of skin: Secondary | ICD-10-CM | POA: Diagnosis not present

## 2022-01-18 DIAGNOSIS — D2271 Melanocytic nevi of right lower limb, including hip: Secondary | ICD-10-CM | POA: Diagnosis not present

## 2022-01-18 DIAGNOSIS — Z85828 Personal history of other malignant neoplasm of skin: Secondary | ICD-10-CM | POA: Diagnosis not present

## 2022-01-18 DIAGNOSIS — D2272 Melanocytic nevi of left lower limb, including hip: Secondary | ICD-10-CM | POA: Diagnosis not present

## 2022-01-18 DIAGNOSIS — L821 Other seborrheic keratosis: Secondary | ICD-10-CM | POA: Diagnosis not present

## 2022-01-18 NOTE — Telephone Encounter (Signed)
Order and records faxed to [place] [facility] for CPAP management and supplies per patient request.

## 2022-01-24 DIAGNOSIS — M461 Sacroiliitis, not elsewhere classified: Secondary | ICD-10-CM | POA: Diagnosis not present

## 2022-01-25 ENCOUNTER — Other Ambulatory Visit: Payer: Medicare HMO

## 2022-01-25 ENCOUNTER — Ambulatory Visit
Admission: RE | Admit: 2022-01-25 | Discharge: 2022-01-25 | Disposition: A | Discharge status: Home / Self Care | Payer: Medicare HMO | Source: Ambulatory Visit | Attending: Neurology | Admitting: Neurology

## 2022-01-25 ENCOUNTER — Other Ambulatory Visit: Payer: Self-pay

## 2022-01-25 ENCOUNTER — Other Ambulatory Visit: Payer: Self-pay | Admitting: Neurology

## 2022-01-25 ENCOUNTER — Inpatient Hospital Stay: Admission: RE | Admit: 2022-01-25 | Payer: Medicare HMO | Source: Ambulatory Visit

## 2022-01-25 ENCOUNTER — Ambulatory Visit: Payer: Medicare HMO

## 2022-01-25 DIAGNOSIS — M4312 Spondylolisthesis, cervical region: Secondary | ICD-10-CM | POA: Diagnosis not present

## 2022-01-25 DIAGNOSIS — H53129 Transient visual loss, unspecified eye: Secondary | ICD-10-CM | POA: Diagnosis not present

## 2022-01-25 DIAGNOSIS — G459 Transient cerebral ischemic attack, unspecified: Secondary | ICD-10-CM

## 2022-01-25 DIAGNOSIS — M47812 Spondylosis without myelopathy or radiculopathy, cervical region: Secondary | ICD-10-CM | POA: Diagnosis not present

## 2022-01-25 DIAGNOSIS — I6523 Occlusion and stenosis of bilateral carotid arteries: Secondary | ICD-10-CM | POA: Diagnosis not present

## 2022-01-25 DIAGNOSIS — I700 Atherosclerosis of aorta: Secondary | ICD-10-CM | POA: Diagnosis not present

## 2022-01-25 MED ORDER — IOPAMIDOL (ISOVUE-370) INJECTION 76%
75.0000 mL | Freq: Once | INTRAVENOUS | Status: AC | PRN
  Administered 2022-01-25: 75 mL via INTRAVENOUS

## 2022-01-25 NOTE — Progress Notes (Signed)
Dustin Berry.  Your CTA head and neck was normal, no stenosis and no large plaque. No further action is required on this test at this time. Please keep any upcoming appointments or tests and  call us with any interim questions, concerns, problems or updates. Thanks,   Alric Ran, MD

## 2022-01-27 DIAGNOSIS — G4733 Obstructive sleep apnea (adult) (pediatric): Secondary | ICD-10-CM | POA: Diagnosis not present

## 2022-01-31 ENCOUNTER — Ambulatory Visit: Payer: Medicare HMO | Admitting: Neurology

## 2022-02-02 DIAGNOSIS — H43392 Other vitreous opacities, left eye: Secondary | ICD-10-CM | POA: Diagnosis not present

## 2022-02-02 DIAGNOSIS — H26492 Other secondary cataract, left eye: Secondary | ICD-10-CM | POA: Diagnosis not present

## 2022-02-02 DIAGNOSIS — H43811 Vitreous degeneration, right eye: Secondary | ICD-10-CM | POA: Diagnosis not present

## 2022-02-02 DIAGNOSIS — H43391 Other vitreous opacities, right eye: Secondary | ICD-10-CM | POA: Diagnosis not present

## 2022-02-02 DIAGNOSIS — H33321 Round hole, right eye: Secondary | ICD-10-CM | POA: Diagnosis not present

## 2022-02-02 DIAGNOSIS — H26491 Other secondary cataract, right eye: Secondary | ICD-10-CM | POA: Diagnosis not present

## 2022-02-03 DIAGNOSIS — H33321 Round hole, right eye: Secondary | ICD-10-CM | POA: Diagnosis not present

## 2022-02-28 ENCOUNTER — Ambulatory Visit: Payer: Medicare HMO | Admitting: Neurology

## 2022-02-28 DIAGNOSIS — L988 Other specified disorders of the skin and subcutaneous tissue: Secondary | ICD-10-CM | POA: Diagnosis not present

## 2022-02-28 DIAGNOSIS — D485 Neoplasm of uncertain behavior of skin: Secondary | ICD-10-CM | POA: Diagnosis not present

## 2022-03-07 ENCOUNTER — Ambulatory Visit: Payer: Medicare HMO | Admitting: Neurology

## 2022-03-07 ENCOUNTER — Telehealth: Payer: Self-pay | Admitting: Neurology

## 2022-03-07 ENCOUNTER — Encounter: Payer: Self-pay | Admitting: Neurology

## 2022-03-07 VITALS — BP 125/82 | HR 56 | Ht 77.0 in | Wt 231.0 lb

## 2022-03-07 DIAGNOSIS — E538 Deficiency of other specified B group vitamins: Secondary | ICD-10-CM

## 2022-03-07 DIAGNOSIS — G3184 Mild cognitive impairment, so stated: Secondary | ICD-10-CM

## 2022-03-07 MED ORDER — CYANOCOBALAMIN 2000 MCG PO TABS: 2000.0000 ug | ORAL_TABLET | Freq: Every day | ORAL | 1 refills | Status: DC

## 2022-03-07 NOTE — Progress Notes (Signed)
? ?[facility] ? ?PATIENT: Dustin Berry. ?DOB: 1946/06/04 ? ?REQUESTING CLINICIAN: Biagio Borg, MD ?HISTORY FROM: Patient and Spouse  ?REASON FOR VISIT: Memory problem  ? ? ?HISTORICAL ? ?CHIEF COMPLAINT:  ?Chief Complaint  ?Patient presents with  ? Follow-up  ?  Room 13,  ?States he is doing well on medications  ?Here to discuss memory, moca 21/30 ? ?  ? ?INTERVAL HISTORY 03/07/2022:  ?Patient presents today for follow-up, at last visit plan was to start pregabalin for his neuropathic pain.  He reported the pain improved.  Today he is concerned for his memory.  He reported he has issue with recall, his long-term memory and short-term memory are intact.  Wife thinks he does not do all the tasks he supposed to do, he does forget a lot.  There was time at home where he forget and left the stove on, burning some pots.  He still drives, does not have any recent accident or being lost in family or places.  He reported mother had a history of Alzheimer's disease, diagnosed in the 56s.  He still independent, able to for perform all ADLs and a IADLs.  Denies any word finding difficulty denies forgetting names of family members.  Wife took over the finances a few years ago because he did forget to pay bills on time. ? ? ?HISTORY OF PRESENT ILLNESS:  ?This is a 76 year old gentleman with past medical history of hypertension hyperlipidemia, previously atrial fibrillation status post ablation who was referred by PMD for TIA work up.  Patient reports 6 weeks ago while at a computer desk he noted  binocular double vision, on closing 1 eye his vision will be back to normal but with both eyes he will have double vision.  The entire episode lasted about 3 minutes then resolved.  There were no associated symptoms with the blurry vision, denies any headaches, denies any numbness, no weakness and no slurred speech.  He never experienced an episode like that in the past and has not had any additional episodes.   He did follow-up with his primary care doctor who obtained a stroke labs and refer him to neurology for TIA work-up.  His lipid panel was within normal limits and a hemoglobin A1c was 5.7.  He denies any previous history of strokes.   ? ? ?OTHER MEDICAL CONDITIONS: HLD, HTN, CAD ? ? ?REVIEW OF SYSTEMS: Full 14 system review of systems performed and negative with exception of: as noted in the HPI  ? ?ALLERGIES: ?Allergies  ?Allergen Reactions  ? Penicillin G Rash  ? Quinolones   ?  Other reaction(s): Other (See Comments) ?Fluroquinolone antibiotics should be avoided in patients with history of aortic aneurysm/dissection  ? Amoxil [Amoxicillin] Rash  ? ? ?HOME MEDICATIONS: ?Outpatient Medications Prior to Visit  ?Medication Sig Dispense Refill  ? aspirin 81 MG EC tablet Take 1 tablet (81 mg total) by mouth daily. Swallow whole. 30 tablet 12  ? atorvastatin (LIPITOR) 80 MG tablet Take 1 tablet (80 mg total) by mouth daily. 90 tablet 3  ? lisinopril (ZESTRIL) 10 MG tablet Take 1 tablet (10 mg total) by mouth daily. 90 tablet 3  ? meloxicam (MOBIC) 15 MG tablet Take 10 mg by mouth as needed. TAKE 1/2 TABLET AS NEEDED    ? metoprolol succinate (TOPROL-XL) 50 MG 24 hr tablet Take 1 tablet (50 mg total) by mouth daily. Take 37.'5mg'$  (1.5 tablets) daily for two weeks, then increase to '50mg'$  (2 tablets)  daily. 90 tablet 3  ? VITAMIN D PO Take by mouth.    ? zolpidem (AMBIEN) 5 MG tablet TAKE 1 TABLET BY MOUTH AT BEDTIME AS NEEDED FOR SLEEP. 30 tablet 0  ? Cyanocobalamin (VITAMIN B 12 PO) Take by mouth.    ? pregabalin (LYRICA) 75 MG capsule Take 1 capsule (75 mg total) by mouth at bedtime. 30 capsule 3  ? ?No facility-administered medications prior to visit.  ? ? ?PAST MEDICAL HISTORY: ?Past Medical History:  ?Diagnosis Date  ? Abdominal pain 04/01/2013  ? ALLERGIC RHINITIS 05/12/2008  ? Arthritis   ? Atrial fibrillation ([place]) 03/15/2010  ? 14 day event monitor - 1 episode of sinus bradycardia  ? Atrial fibrillation with RVR ([place])  10/21/2012  ? Echo- EF 50-55%; mild concentric LVH; flow pattern suggestive of impaired LV relaxation; mild mitral valve prolapse, trace mitral regurgitation  ? B12 deficiency 06/15/2019  ? Back pain   ? receiving PT  ? Boil of buttock 06/24/11  ? BPH (benign prostatic hypertrophy) 09/05/2012  ? Cataract   ? Chest pain with exertion presumed to be tachycardia related along with SOB 04/01/2013  ? ESOPHAGEAL STRICTURE 03/24/2009  ? Heart murmur   ? Hematuria, possible 04/01/2013  ? HTN (hypertension) 12/04/2013  ? HYPERLIPIDEMIA 08/18/2007  ? Insomnia 06/08/2016  ? INSOMNIA-SLEEP DISORDER-UNSPEC 05/14/2009  ? Mitral valve prolapse 07/04/2011  ? MRSA (methicillin resistant staph aureus) culture positive 5+ years ago  ? Neuromuscular disorder ([place])   ? peripheral neuropathy  ? OSA (obstructive sleep apnea) 07/04/2011  ? sleep study - average AHI 2.5  ? Palpitations 03/06/2007  ? R/P MV - mild perfusion defect in basal inferoseptal, basal inferior, mid inferoseptal, and mid inferior regions, consistent w/ infarct/scar; no scintigraphic evidence of inducible myocardial ischemia; prior non transmural infarct cannot be completely excluded; EF 48%; no significant change from previous study  ? PERIPHERAL NEUROPATHY 05/12/2008  ? Personal history of colonic polyps 05/12/2008  ? Preventative health care 06/24/2011  ? Sleep apnea   ? pt denies  ? 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 ? ?PAST SURGICAL HISTORY: ?Past Surgical History:  ?Procedure Laterality Date  ? ATRIAL FIBRILLATION ABLATION    ? CARDIOVERSION N/A 04/03/2013  ? Procedure: CARDIOVERSION;  Surgeon: Pixie Casino, MD;  Location: [facility] ENDOSCOPY;  Service: Cardiovascular;  Laterality: N/A;  ? COLONOSCOPY  2018  ? HAND SURGERY    ? Thumb joint repair  ? POLYPECTOMY    ? TEE WITHOUT CARDIOVERSION N/A 04/03/2013  ? Procedure: TRANSESOPHAGEAL ECHOCARDIOGRAM (TEE);  Surgeon: Pixie Casino, MD;  Location: [facility];  Service: Cardiovascular;  Laterality: N/A;  ? TONSILLECTOMY AND  ADENOIDECTOMY    ? UPPER GASTROINTESTINAL ENDOSCOPY    ? dilation  ? ? ?FAMILY HISTORY: ?Family History  ?Problem Relation Age of Onset  ? Heart disease Father 30  ?     died with MI  ? Melanoma Sister   ? Dementia Mother   ? Stroke Maternal Grandfather   ? Breast cancer Maternal Grandmother   ? Colon cancer Neg Hx   ? Esophageal cancer Neg Hx   ? Stomach cancer Neg Hx   ? Rectal cancer Neg Hx   ? Colon polyps Neg Hx   ? ? ?SOCIAL HISTORY: ?Social History  ? ?Socioeconomic History  ? Marital status: Married  ?  Spouse name: Not on file  ? Number of children: Not on file  ? Years of education: Not on file  ? Highest education level: Not  on file  ?Occupational History  ? 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 Smoking status: Never  ? Smokeless tobacco: Never  ?Vaping Use  ? Vaping Use: Never used  ?Substance and Sexual Activity  ? Alcohol use: Yes  ?  Alcohol/week: 2.0 standard drinks  ?  Types: 2 Standard drinks or equivalent per week  ? Drug use: No  ? Sexual activity: Not on file  ?Other Topics Concern  ? Not on file  ?Social History Narrative  ? Not on file  ? ?Social Determinants of Health  ? ?Financial Resource Strain: Not on file  ?Food Insecurity: Not on file  ?Transportation Needs: Not on file  ?Physical Activity: Not on file  ?Stress: Not on file  ?Social Connections: Not on file  ?Intimate Partner Violence: Not on file  ? ? ? ?PHYSICAL EXAM ? ?GENERAL EXAM/CONSTITUTIONAL: ?Vitals:  ?Vitals:  ? 03/07/22 1018  ?BP: 125/82  ?Pulse: (!) 56  ?Weight: 231 lb (104.8 kg)  ?Height: '6\' 5"'$  (1.956 m)  ? ?Body mass index is 27.39 kg/m?. ?Wt Readings from Last 3 Encounters:  ?03/07/22 231 lb (104.8 kg)  ?01/03/22 245 lb (111.1 kg)  ?11/22/21 248 lb (112.5 kg)  ? ?Patient is in no distress; well developed, nourished and groomed; neck is supple ? ? ?EYES: ?Pupils round and reactive to light, Visual fields full to confrontation, Extraocular movements intacts,   ? ?MUSCULOSKELETAL: ?Gait, strength, tone, movements noted in Neurologic exam below ? ?NEUROLOGIC: ?MENTAL STATUS:  ?   ? View : No data to display.  ?  ?  ?  ? ?awake, alert, oriented to person, place and time ?recen

## 2022-03-07 NOTE — Telephone Encounter (Signed)
Referral sent to [facility]. ?

## 2022-03-07 NOTE — Patient Instructions (Addendum)
Referral to neuropsychiatry for full testing  ?B12 supplement, Will obtain a B12 level in 6 months  ?Follow up in 1 year  ? ? ?There are well-accepted and sensible ways to reduce risk for Alzheimers disease and other degenerative brain disorders . ? ?Exercise Daily Walk A daily 20 minute walk should be part of your routine. Disease related apathy can be a significant roadblock to exercise and the only way to overcome this is to make it a daily routine and perhaps have a reward at the end (something your loved one loves to eat or drink perhaps) or a personal trainer coming to the home can also be very useful. Most importantly, the patient is much more likely to exercise if the caregiver / spouse does it with him/her. In general a structured, repetitive schedule is best. ? ?General Health: Any diseases which effect your body will effect your brain such as a pneumonia, urinary infection, blood clot, heart attack or stroke. Keep contact with your primary care doctor for regular follow ups. ? ?Sleep. A good nights sleep is healthy for the brain. Seven hours is recommended. If you have insomnia or poor sleep habits we can give you some instructions. If you have sleep apnea wear your mask. ? ?Diet: Eating a heart healthy diet is also a good idea; fish and poultry instead of red meat, nuts (mostly non-peanuts), vegetables, fruits, olive oil or canola oil (instead of butter), minimal salt (use other spices to flavor foods), whole grain rice, bread, cereal and pasta and wine in moderation.Research is now showing that the MIND diet, which is a combination of The Mediterranean diet and the DASH diet, is beneficial for cognitive processing and longevity. Information about this diet can be found in The MIND Diet, a book by Doyne Keel, MS, RDN, and online at NotebookDistributors.si ? ?Finances, Power of Producer, television/film/video Directives: You should consider putting legal safeguards in place with regard to  financial and medical decision making. While the spouse always has power of attorney for medical and financial issues in the absence of any form, you should consider what you want in case the spouse / caregiver is no longer around or capable of making decisions.  ? ? ? ?  ? ? ?

## 2022-03-08 ENCOUNTER — Encounter: Payer: Self-pay | Admitting: Psychology

## 2022-03-14 DIAGNOSIS — H35351 Cystoid macular degeneration, right eye: Secondary | ICD-10-CM | POA: Diagnosis not present

## 2022-03-25 DIAGNOSIS — I080 Rheumatic disorders of both mitral and aortic valves: Secondary | ICD-10-CM | POA: Diagnosis not present

## 2022-03-25 DIAGNOSIS — Z9089 Acquired absence of other organs: Secondary | ICD-10-CM | POA: Diagnosis not present

## 2022-03-25 DIAGNOSIS — M533 Sacrococcygeal disorders, not elsewhere classified: Secondary | ICD-10-CM | POA: Diagnosis not present

## 2022-03-25 DIAGNOSIS — Z09 Encounter for follow-up examination after completed treatment for conditions other than malignant neoplasm: Secondary | ICD-10-CM | POA: Diagnosis not present

## 2022-03-25 DIAGNOSIS — R5381 Other malaise: Secondary | ICD-10-CM | POA: Diagnosis not present

## 2022-03-25 DIAGNOSIS — I351 Nonrheumatic aortic (valve) insufficiency: Secondary | ICD-10-CM | POA: Diagnosis not present

## 2022-03-25 DIAGNOSIS — I4891 Unspecified atrial fibrillation: Secondary | ICD-10-CM | POA: Diagnosis not present

## 2022-03-25 DIAGNOSIS — R001 Bradycardia, unspecified: Secondary | ICD-10-CM | POA: Diagnosis not present

## 2022-03-25 DIAGNOSIS — Z01818 Encounter for other preprocedural examination: Secondary | ICD-10-CM | POA: Diagnosis not present

## 2022-03-25 DIAGNOSIS — Z79899 Other long term (current) drug therapy: Secondary | ICD-10-CM | POA: Diagnosis not present

## 2022-03-25 DIAGNOSIS — Z8679 Personal history of other diseases of the circulatory system: Secondary | ICD-10-CM | POA: Diagnosis not present

## 2022-03-25 DIAGNOSIS — I77810 Thoracic aortic ectasia: Secondary | ICD-10-CM | POA: Diagnosis not present

## 2022-03-25 DIAGNOSIS — Z7982 Long term (current) use of aspirin: Secondary | ICD-10-CM | POA: Diagnosis not present

## 2022-03-25 DIAGNOSIS — I7121 Aneurysm of the ascending aorta, without rupture: Secondary | ICD-10-CM | POA: Diagnosis not present

## 2022-03-25 DIAGNOSIS — Z8673 Personal history of transient ischemic attack (TIA), and cerebral infarction without residual deficits: Secondary | ICD-10-CM | POA: Diagnosis not present

## 2022-03-28 ENCOUNTER — Encounter: Payer: Self-pay | Admitting: Internal Medicine

## 2022-03-28 DIAGNOSIS — G4733 Obstructive sleep apnea (adult) (pediatric): Secondary | ICD-10-CM

## 2022-04-01 DIAGNOSIS — N281 Cyst of kidney, acquired: Secondary | ICD-10-CM | POA: Diagnosis not present

## 2022-04-01 DIAGNOSIS — D1771 Benign lipomatous neoplasm of kidney: Secondary | ICD-10-CM | POA: Diagnosis not present

## 2022-04-01 DIAGNOSIS — D1803 Hemangioma of intra-abdominal structures: Secondary | ICD-10-CM | POA: Diagnosis not present

## 2022-04-01 DIAGNOSIS — M47816 Spondylosis without myelopathy or radiculopathy, lumbar region: Secondary | ICD-10-CM | POA: Diagnosis not present

## 2022-04-01 DIAGNOSIS — M48061 Spinal stenosis, lumbar region without neurogenic claudication: Secondary | ICD-10-CM | POA: Diagnosis not present

## 2022-04-08 DIAGNOSIS — N401 Enlarged prostate with lower urinary tract symptoms: Secondary | ICD-10-CM | POA: Diagnosis not present

## 2022-04-08 DIAGNOSIS — R3915 Urgency of urination: Secondary | ICD-10-CM | POA: Diagnosis not present

## 2022-04-08 DIAGNOSIS — D1771 Benign lipomatous neoplasm of kidney: Secondary | ICD-10-CM | POA: Diagnosis not present

## 2022-04-25 DIAGNOSIS — Z0100 Encounter for examination of eyes and vision without abnormal findings: Secondary | ICD-10-CM | POA: Diagnosis not present

## 2022-04-25 DIAGNOSIS — H35351 Cystoid macular degeneration, right eye: Secondary | ICD-10-CM | POA: Diagnosis not present

## 2022-04-26 ENCOUNTER — Encounter: Payer: Self-pay | Admitting: Neurology

## 2022-04-28 ENCOUNTER — Institutional Professional Consult (permissible substitution): Payer: Medicare HMO | Admitting: Pulmonary Disease

## 2022-05-19 ENCOUNTER — Encounter: Payer: Self-pay | Admitting: Pulmonary Disease

## 2022-05-19 ENCOUNTER — Ambulatory Visit (INDEPENDENT_AMBULATORY_CARE_PROVIDER_SITE_OTHER): Payer: Medicare HMO | Admitting: Pulmonary Disease

## 2022-05-19 VITALS — BP 112/70 | HR 58 | Temp 97.4°F | Ht 77.0 in | Wt 218.8 lb

## 2022-05-19 DIAGNOSIS — G4733 Obstructive sleep apnea (adult) (pediatric): Secondary | ICD-10-CM

## 2022-05-19 NOTE — Progress Notes (Signed)
Dustin Berry    [id number]    December 19, 1945  Primary Care Physician:John, Hunt Oris, MD  Referring Physician: Biagio Borg, MD [address] [place],  [place] [address]  Chief complaint:   Patient with a past history of obstructive sleep apnea  HPI:  Past history of obstructive sleep apnea Trying to get back to using CPAP  He has had broken sleep patterns recently Usually tries to go to bed between 11 and 12 Sleep onset varies between half an hour and an hour Wakes up about 3-4 times during the night to use the bathroom Final wake up time about 8:30 in the morning  His weight is down about 30 pounds recently from dieting  He feels is more sedentary recently due to back pain  He is active and continues to work but limited by back pain  Does have a history of atrial fibrillation for which she had an ablation about 8 years ago, that was the last time he used a CPAP he may have used his CPAP about 2 years in the past  No significant dryness of his mouth in the mornings No morning headaches Memory is fair  Pets: Occupation: Exposures: Smoking history: Travel history: Relevant family history:  Outpatient Encounter Medications as of 05/19/2022  Medication Sig   aspirin 81 MG EC tablet Take 1 tablet (81 mg total) by mouth daily. Swallow whole.   atorvastatin (LIPITOR) 80 MG tablet Take 1 tablet (80 mg total) by mouth daily.   cyanocobalamin 2000 MCG tablet Take 1 tablet (2,000 mcg total) by mouth daily.   lisinopril (ZESTRIL) 10 MG tablet Take 1 tablet (10 mg total) by mouth daily.   meloxicam (MOBIC) 15 MG tablet Take 10 mg by mouth as needed. TAKE 1/2 TABLET AS NEEDED   metoprolol succinate (TOPROL-XL) 50 MG 24 hr tablet Take 1 tablet (50 mg total) by mouth daily. Take 37.'5mg'$  (1.5 tablets) daily for two weeks, then increase to '50mg'$  (2 tablets) daily.   VITAMIN D PO Take by mouth.   zolpidem (AMBIEN) 5 MG tablet TAKE 1 TABLET BY MOUTH AT BEDTIME AS NEEDED  FOR SLEEP.   pregabalin (LYRICA) 75 MG capsule Take 1 capsule (75 mg total) by mouth at bedtime.   No facility-administered encounter medications on file as of 05/19/2022.    Allergies as of 05/19/2022 - Review Complete 05/19/2022  Allergen Reaction Noted   Penicillin g Rash 01/05/2018   Quinolones Other (See Comments) 09/13/2021   Amoxil [amoxicillin] Rash 04/07/2018    Past Medical History:  Diagnosis Date   Abdominal pain 04/01/2013   ALLERGIC RHINITIS 05/12/2008   Arthritis    Atrial fibrillation ([place]) 03/15/2010   14 day event monitor - 1 episode of sinus bradycardia   Atrial fibrillation with RVR ([place]) 10/21/2012   Echo- EF 50-55%; mild concentric LVH; flow pattern suggestive of impaired LV relaxation; mild mitral valve prolapse, trace mitral regurgitation   B12 deficiency 06/15/2019   Back pain    receiving PT   Boil of buttock 06/24/11   BPH (benign prostatic hypertrophy) 09/05/2012   Cataract    Chest pain with exertion presumed to be tachycardia related along with SOB 04/01/2013   ESOPHAGEAL STRICTURE 03/24/2009   Heart murmur    Hematuria, possible 04/01/2013   HTN (hypertension) 12/04/2013   HYPERLIPIDEMIA 08/18/2007   Insomnia 06/08/2016   INSOMNIA-SLEEP DISORDER-UNSPEC 05/14/2009   Mitral valve prolapse 07/04/2011   MRSA (methicillin resistant staph  aureus) culture positive 5+ years ago   Neuromuscular disorder ([place])    peripheral neuropathy   OSA (obstructive sleep apnea) 07/04/2011   sleep study - average AHI 2.5   Palpitations 03/06/2007   R/P MV - mild perfusion defect in basal inferoseptal, basal inferior, mid inferoseptal, and mid inferior regions, consistent w/ infarct/scar; no scintigraphic evidence of inducible myocardial ischemia; prior non transmural infarct cannot be completely excluded; EF 48%; no significant change from previous study   PERIPHERAL NEUROPATHY 05/12/2008   Personal history of colonic polyps 05/12/2008   Preventative health care 06/24/2011   Sleep  apnea    pt denies   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    Past Surgical History:  Procedure Laterality Date   ATRIAL FIBRILLATION ABLATION     CARDIOVERSION N/A 04/03/2013   Procedure: CARDIOVERSION;  Surgeon: Pixie Casino, MD;  Location: [facility];  Service: Cardiovascular;  Laterality: N/A;   COLONOSCOPY  2018   HAND SURGERY     Thumb joint repair   POLYPECTOMY     TEE WITHOUT CARDIOVERSION N/A 04/03/2013   Procedure: TRANSESOPHAGEAL ECHOCARDIOGRAM (TEE);  Surgeon: Pixie Casino, MD;  Location[facility] ENDOSCOPY;  Service: Cardiovascular;  Laterality: N/A;   TONSILLECTOMY AND ADENOIDECTOMY     UPPER GASTROINTESTINAL ENDOSCOPY     dilation    Family History  Problem Relation Age of Onset   Heart disease Father 11       died with MI   Melanoma Sister    Dementia Mother    Stroke Maternal Grandfather    Breast cancer Maternal Grandmother    Colon cancer Neg Hx    Esophageal cancer Neg Hx    Stomach cancer Neg Hx    Rectal cancer Neg Hx    Colon polyps Neg Hx     Social History   Socioeconomic History   Marital status: Married    Spouse name: Not on file   Number of children: Not on file   Years of education: Not on file   Highest education level: Not on file  Occupational History   Occupation: semi-retired Tourist information centre manager, former self employed tech co support  Tobacco Use   Smoking status: Never   Smokeless tobacco: Never  Vaping Use   Vaping Use: Never used  Substance and Sexual Activity   Alcohol use: Yes    Alcohol/week: 2.0 standard drinks of alcohol    Types: 2 Standard drinks or equivalent per week   Drug use: No   Sexual activity: Not on file  Other Topics Concern   Not on file  Social History Narrative   Not on file   Social Determinants of Health   Financial Resource Strain: Not on file  Food Insecurity: Not on file  Transportation Needs: Not on file  Physical Activity: Not on file  Stress: Not on file  Social Connections: Not on file   Intimate Partner Violence: Not on file    Review of Systems  Constitutional:  Negative for fatigue.  Psychiatric/Behavioral:  Positive for sleep disturbance.     Vitals:   05/19/22 0934  BP: 112/70  Pulse: (!) 58  Temp: (!) 97.4 F (36.3 C)  SpO2: 97%     Physical Exam Constitutional:      Appearance: Normal appearance.  HENT:     Head: Normocephalic.     Mouth/Throat:     Mouth: Mucous membranes are moist.  Cardiovascular:     Rate and Rhythm: Normal rate and regular rhythm.  Heart sounds: No murmur heard.    No friction rub.  Pulmonary:     Effort: No respiratory distress.     Breath sounds: No stridor. No wheezing or rhonchi.  Musculoskeletal:     Cervical back: No rigidity or tenderness.  Neurological:     Mental Status: He is alert.  Psychiatric:        Mood and Affect: Mood normal.       05/19/2022    9:00 AM  Results of the Epworth flowsheet  Sitting and reading 1  Watching TV 1  Sitting, inactive in a public place (e.g. a theatre or a meeting) 0  As a passenger in a car for an hour without a break 1  Lying down to rest in the afternoon when circumstances permit 2  Sitting and talking to someone 0  Sitting quietly after a lunch without alcohol 0  In a car, while stopped for a few minutes in traffic 0  Total score 5     Data Reviewed: Previous sleep study is not available to be reviewed  Assessment:  Moderate protein of significant obstructive sleep apnea  Past history of obstructive sleep apnea  Has had significant weight loss recently that may help severity of sleep disordered breathing  Nonrestorative sleep  Plan/Recommendations: Schedule patient for home sleep study  I will see him back in about 3 months  Encouraged to call with significant concerns  Graded activities as tolerated   Dustin Rist MD [facility] 05/19/2022, 9:40 AM  CC: Biagio Borg, MD

## 2022-05-19 NOTE — Patient Instructions (Signed)
Past history of obstructive sleep apnea  Moderate risk of significant obstructive sleep apnea Severity of sleep apnea does get better with weight loss  Continue weight loss efforts   We will schedule you for a home sleep study Update you with results as soon as reviewed   Tentative follow-up in the office in about 3 months  Call with significant concerns  Living With Sleep Apnea Sleep apnea is a condition in which breathing pauses or becomes shallow during sleep. Sleep apnea is most commonly caused by a collapsed or blocked airway. People with sleep apnea usually snore loudly. They may have times when they gasp and stop breathing for 10 seconds or more during sleep. This may happen many times during the night. The breaks in breathing also interrupt the deep sleep that you need to feel rested. Even if you do not completely wake up from the gaps in breathing, your sleep may not be restful and you feel tired during the day. You may also have a headache in the morning and low energy during the day, and you may feel anxious or depressed. How can sleep apnea affect me? Sleep apnea increases your chances of extreme tiredness during the day (daytime fatigue). It can also increase your risk for health conditions, such as: Heart attack. Stroke. Obesity. Type 2 diabetes. Heart failure. Irregular heartbeat. High blood pressure. If you have daytime fatigue as a result of sleep apnea, you may be more likely to: Perform poorly at school or work. Fall asleep while driving. Have difficulty with attention. Develop depression or anxiety. Have sexual dysfunction. What actions can I take to manage sleep apnea? Sleep apnea treatment  If you were given a device to open your airway while you sleep, use it only as told by your health care provider. You may be given: An oral appliance. This is a custom-made mouthpiece that shifts your lower jaw forward. A continuous positive airway pressure (CPAP)  device. This device blows air through a mask when you breathe out (exhale). A nasal expiratory positive airway pressure (EPAP) device. This device has valves that you put into each nostril. A bi-level positive airway pressure (BIPAP) device. This device blows air through a mask when you breathe in (inhale) and breathe out (exhale). You may need surgery if other treatments do not work for you. Sleep habits Go to sleep and wake up at the same time every day. This helps set your internal clock (circadian rhythm) for sleeping. If you stay up later than usual, such as on weekends, try to get up in the morning within 2 hours of your normal wake time. Try to get at least 7-9 hours of sleep each night. Stop using a computer, tablet, and mobile phone a few hours before bedtime. Do not take long naps during the day. If you nap, limit it to 30 minutes. Have a relaxing bedtime routine. Reading or listening to music may relax you and help you sleep. Use your bedroom only for sleep. Keep your television and computer out of your bedroom. Keep your bedroom cool, dark, and quiet. Use a supportive mattress and pillows. Follow your health care provider's instructions for other changes to sleep habits. Nutrition Do not eat heavy meals in the evening. Do not have caffeine in the later part of the day. The effects of caffeine can last for more than 5 hours. Follow your health care provider's or dietitian's instructions for any diet changes. Lifestyle     Do not drink alcohol before bedtime.  Alcohol can cause you to fall asleep at first, but then it can cause you to wake up in the middle of the night and have trouble getting back to sleep. Do not use any products that contain nicotine or tobacco. These products include cigarettes, chewing tobacco, and vaping devices, such as e-cigarettes. If you need help quitting, ask your health care provider. Medicines Take over-the-counter and prescription medicines only as  told by your health care provider. Do not use over-the-counter sleep medicine. You can become dependent on this medicine, and it can make sleep apnea worse. Do not use medicines, such as sedatives and narcotics, unless told by your health care provider. Activity Exercise on most days, but avoid exercising in the evening. Exercising near bedtime can interfere with sleeping. If possible, spend time outside every day. Natural light helps regulate your circadian rhythm. General information Lose weight if you need to, and maintain a healthy weight. Keep all follow-up visits. This is important. If you are having surgery, make sure to tell your health care provider that you have sleep apnea. You may need to bring your device with you. Where to find more information Learn more about sleep apnea and daytime fatigue from: American Sleep Association: sleepassociation.[place]: sleepfoundation.org National Heart, Lung, and Blood Institute: https://www.[place].biz/ Summary Sleep apnea is a condition in which breathing pauses or becomes shallow during sleep. Sleep apnea can cause daytime fatigue and other serious health conditions. You may need to wear a device while sleeping to help keep your airway open. If you are having surgery, make sure to tell your health care provider that you have sleep apnea. You may need to bring your device with you. Making changes to sleep habits, diet, lifestyle, and activity can help you manage sleep apnea. This information is not intended to replace advice given to you by your health care provider. Make sure you discuss any questions you have with your health care provider. Document Revised: 06/30/2021 Document Reviewed: 10/30/2020 Elsevier Patient Education  [place].

## 2022-05-23 ENCOUNTER — Ambulatory Visit (INDEPENDENT_AMBULATORY_CARE_PROVIDER_SITE_OTHER): Payer: Medicare HMO | Admitting: Internal Medicine

## 2022-05-23 ENCOUNTER — Encounter: Payer: Self-pay | Admitting: Internal Medicine

## 2022-05-23 ENCOUNTER — Telehealth: Payer: Self-pay | Admitting: Cardiovascular Disease

## 2022-05-23 VITALS — BP 130/90 | HR 58 | Temp 97.8°F | Ht 77.0 in | Wt 219.0 lb

## 2022-05-23 DIAGNOSIS — E559 Vitamin D deficiency, unspecified: Secondary | ICD-10-CM

## 2022-05-23 DIAGNOSIS — Z7901 Long term (current) use of anticoagulants: Secondary | ICD-10-CM | POA: Insufficient documentation

## 2022-05-23 DIAGNOSIS — E7800 Pure hypercholesterolemia, unspecified: Secondary | ICD-10-CM

## 2022-05-23 DIAGNOSIS — Z0001 Encounter for general adult medical examination with abnormal findings: Secondary | ICD-10-CM | POA: Diagnosis not present

## 2022-05-23 DIAGNOSIS — I7121 Aneurysm of the ascending aorta, without rupture: Secondary | ICD-10-CM | POA: Diagnosis not present

## 2022-05-23 DIAGNOSIS — Z125 Encounter for screening for malignant neoplasm of prostate: Secondary | ICD-10-CM | POA: Diagnosis not present

## 2022-05-23 DIAGNOSIS — E538 Deficiency of other specified B group vitamins: Secondary | ICD-10-CM

## 2022-05-23 DIAGNOSIS — I480 Paroxysmal atrial fibrillation: Secondary | ICD-10-CM | POA: Diagnosis not present

## 2022-05-23 DIAGNOSIS — I10 Essential (primary) hypertension: Secondary | ICD-10-CM | POA: Diagnosis not present

## 2022-05-23 DIAGNOSIS — R739 Hyperglycemia, unspecified: Secondary | ICD-10-CM

## 2022-05-23 HISTORY — DX: Vitamin D deficiency, unspecified: E55.9

## 2022-05-23 LAB — URINALYSIS, ROUTINE W REFLEX MICROSCOPIC
Bilirubin Urine: NEGATIVE
Hgb urine dipstick: NEGATIVE
Ketones, ur: NEGATIVE
Leukocytes,Ua: NEGATIVE
Nitrite: NEGATIVE
RBC / HPF: NONE SEEN (ref 0–?)
Specific Gravity, Urine: 1.025 (ref 1.000–1.030)
Total Protein, Urine: NEGATIVE
Urine Glucose: NEGATIVE
Urobilinogen, UA: 0.2 (ref 0.0–1.0)
pH: 6 (ref 5.0–8.0)

## 2022-05-23 LAB — CBC WITH DIFFERENTIAL/PLATELET
Basophils Absolute: 0 10*3/uL (ref 0.0–0.1)
Basophils Relative: 0.6 % (ref 0.0–3.0)
Eosinophils Absolute: 0.1 10*3/uL (ref 0.0–0.7)
Eosinophils Relative: 3.8 % (ref 0.0–5.0)
HCT: 42.2 % (ref 39.0–52.0)
Hemoglobin: 14.2 g/dL (ref 13.0–17.0)
Lymphocytes Relative: 32.2 % (ref 12.0–46.0)
Lymphs Abs: 1.2 10*3/uL (ref 0.7–4.0)
MCHC: 33.7 g/dL (ref 30.0–36.0)
MCV: 90.6 fl (ref 78.0–100.0)
Monocytes Absolute: 0.5 10*3/uL (ref 0.1–1.0)
Monocytes Relative: 13.1 % — ABNORMAL HIGH (ref 3.0–12.0)
Neutro Abs: 1.9 10*3/uL (ref 1.4–7.7)
Neutrophils Relative %: 50.3 % (ref 43.0–77.0)
Platelets: 196 10*3/uL (ref 150.0–400.0)
RBC: 4.66 Mil/uL (ref 4.22–5.81)
RDW: 14.4 % (ref 11.5–15.5)
WBC: 3.8 10*3/uL — ABNORMAL LOW (ref 4.0–10.5)

## 2022-05-23 LAB — LIPID PANEL
Cholesterol: 84 mg/dL (ref 0–200)
HDL: 34.5 mg/dL — ABNORMAL LOW (ref >39.00)
LDL Cholesterol: 39 mg/dL (ref 0–99)
NonHDL: 49.56
Total CHOL/HDL Ratio: 2
Triglycerides: 55 mg/dL (ref 0.0–149.0)
VLDL: 11 mg/dL (ref 0.0–40.0)

## 2022-05-23 LAB — HEPATIC FUNCTION PANEL
ALT: 31 U/L (ref 0–53)
AST: 26 U/L (ref 0–37)
Albumin: 3.9 g/dL (ref 3.5–5.2)
Alkaline Phosphatase: 70 U/L (ref 39–117)
Bilirubin, Direct: 0.2 mg/dL (ref 0.0–0.3)
Total Bilirubin: 0.6 mg/dL (ref 0.2–1.2)
Total Protein: 6.1 g/dL (ref 6.0–8.3)

## 2022-05-23 LAB — BASIC METABOLIC PANEL
BUN: 17 mg/dL (ref 6–23)
CO2: 30 mEq/L (ref 19–32)
Calcium: 9.5 mg/dL (ref 8.4–10.5)
Chloride: 107 mEq/L (ref 96–112)
Creatinine, Ser: 1.13 mg/dL (ref 0.40–1.50)
GFR: 63.79 mL/min (ref >60.00)
Glucose, Bld: 97 mg/dL (ref 70–99)
Potassium: 4.1 mEq/L (ref 3.5–5.1)
Sodium: 140 mEq/L (ref 135–145)

## 2022-05-23 LAB — VITAMIN B12: Vitamin B-12: 702 pg/mL (ref 211–911)

## 2022-05-23 LAB — PSA: PSA: 1.31 ng/mL (ref 0.10–4.00)

## 2022-05-23 LAB — VITAMIN D 25 HYDROXY (VIT D DEFICIENCY, FRACTURES): VITD: 108.31 ng/mL (ref 30.00–100.00)

## 2022-05-23 LAB — HEMOGLOBIN A1C: Hgb A1c MFr Bld: 5.6 % (ref 4.6–6.5)

## 2022-05-23 LAB — TSH: TSH: 1.54 u[IU]/mL (ref 0.35–5.50)

## 2022-05-23 NOTE — Telephone Encounter (Signed)
Patient c/o Palpitations:  High priority if patient c/o lightheadedness, shortness of breath, or chest pain  How long have you had palpitations/irregular HR/ Afib? Started last night.  Are you having the symptoms now? Yes   Are you currently experiencing lightheadedness, SOB or CP? No   Do you have a history of afib (atrial fibrillation) or irregular heart rhythm? Yes, history of A-Fib  Have you checked your BP or HR? (document readings if available): HR 56 BP 130/80 something  Are you experiencing any other symptoms? No other symptoms.   Patient state he went to his PCP  who confirmed he is in A-fib.

## 2022-05-23 NOTE — Assessment & Plan Note (Signed)
Lab Results  Component Value Date   VITAMINB12 162 (L) 11/22/2021   Low, to start oral replacement - b12 1000 mcg qd

## 2022-05-23 NOTE — Assessment & Plan Note (Signed)
Lab Results  Component Value Date   LDLCALC 42 11/22/2021   Stable, pt to continue current statin liptior 80 mg

## 2022-05-23 NOTE — Assessment & Plan Note (Signed)
BP Readings from Last 3 Encounters:  05/23/22 130/90  05/19/22 112/70  03/07/22 125/82   Stable, pt to continue medical treatment lisinopril 10 mg, toprol xl 50 bid

## 2022-05-23 NOTE — Patient Instructions (Addendum)
Your ECG was done today and is considered normal  Since your exam seemed to be irregular however, we should order the heart monitor for 2 wks; if this is not normal, you should see Dr Claiborne Billings and consider restarting the Eliquis  Please continue all other medications as before, and refills have been done if requested.  Please have the pharmacy call with any other refills you may need.  Please continue your efforts at being more active, low cholesterol diet, and weight control.  You are otherwise up to date with prevention measures today.  Please keep your appointments with your specialists as you may have planned  Please go to the LAB at the blood drawing area for the tests to be done  You will be contacted by phone if any changes need to be made immediately.  Otherwise, you will receive a letter about your results with an explanation, but please check with MyChart first.  Please remember to sign up for MyChart if you have not done so, as this will be important to you in the future with finding out test results, communicating by private email, and scheduling acute appointments online when needed.  Please make an Appointment to return in 6 months, or sooner if needed

## 2022-05-23 NOTE — Assessment & Plan Note (Signed)
ECG reviewed - NSR, rate and volume ok, for card event monitor as suspect possible PAF

## 2022-05-23 NOTE — Assessment & Plan Note (Signed)
Also followed at [facility] for this, stable per pt

## 2022-05-23 NOTE — Progress Notes (Unsigned)
Patient ID: Dustin Berry., male   DOB: 1946/02/28, 76 y.o.   MRN: [id number]         Chief Complaint:: wellness exam and Follow-up  Htn, low b12 and D, palpitations       HPI:  Dustin Berry. is a 75 y.o. male here for wellness exam; up to date                        Also had episode afib yesterday with palpitations and on his smart watch as well; s/p card ablation about 8 yrs ago, stable since then. Pt denies chest pain, increased sob or doe, wheezing, orthopnea, PND, increased LE swelling, dizziness or syncope.   Pt denies polydipsia, polyuria, or new focal neuro s/s.    Pt denies fever, wt loss, night sweats, loss of appetite, or other constitutional symptoms    has not been on anticoagulant since the ablation.     Wt Readings from Last 3 Encounters:  05/25/22 225 lb 9.6 oz (102.3 kg)  05/23/22 219 lb (99.3 kg)  05/19/22 218 lb 12.8 oz (99.2 kg)   BP Readings from Last 3 Encounters:  05/25/22 114/86  05/23/22 130/90  05/19/22 112/70   Immunization History  Administered Date(s) Administered   Influenza Split 09/05/2012   Influenza, High Dose Seasonal PF 09/05/2018, 09/18/2021   Influenza, Quadrivalent, Recombinant, Inj, Pf 09/02/2019   Influenza-Unspecified 11/22/2017, 10/07/2021, 10/14/2021   Moderna Sars-Covid-2 Vaccination 01/27/2020, 02/24/2020, 10/15/2020, 09/13/2021   Pneumococcal Conjugate-13 04/29/2014   Pneumococcal Polysaccharide-23 09/05/2012   Td 05/14/2009   Tdap 06/12/2019   Zoster Recombinat (Shingrix) 05/05/2018, 07/05/2018   Zoster, Live 07/10/2009  There are no preventive care reminders to display for this patient.    Past Medical History:  Diagnosis Date   Abdominal pain 04/01/2013   ALLERGIC RHINITIS 05/12/2008   Arthritis    Atrial fibrillation ([place]) 03/15/2010   14 day event monitor - 1 episode of sinus bradycardia   Atrial fibrillation with RVR ([place]) 10/21/2012   Echo- EF 50-55%; mild concentric LVH; flow pattern suggestive of impaired LV  relaxation; mild mitral valve prolapse, trace mitral regurgitation   B12 deficiency 06/15/2019   Back pain    receiving PT   Boil of buttock 06/24/11   BPH (benign prostatic hypertrophy) 09/05/2012   Cataract    Chest pain with exertion presumed to be tachycardia related along with SOB 04/01/2013   ESOPHAGEAL STRICTURE 03/24/2009   Heart murmur    Hematuria, possible 04/01/2013   HTN (hypertension) 12/04/2013   HYPERLIPIDEMIA 08/18/2007   Insomnia 06/08/2016   INSOMNIA-SLEEP DISORDER-UNSPEC 05/14/2009   Mitral valve prolapse 07/04/2011   MRSA (methicillin resistant staph aureus) culture positive 5+ years ago   Neuromuscular disorder (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   Past Surgical History:  Procedure Laterality Date   ATRIAL FIBRILLATION ABLATION     CARDIOVERSION N/A 04/03/2013  Procedure: CARDIOVERSION;  Surgeon: Pixie Casino, MD;  Location: [facility] ENDOSCOPY;  Service: Cardiovascular;  Laterality: N/A;   COLONOSCOPY  2018   HAND SURGERY     Thumb joint repair   POLYPECTOMY     TEE WITHOUT CARDIOVERSION N/A 04/03/2013   Procedure: TRANSESOPHAGEAL ECHOCARDIOGRAM (TEE);  Surgeon: Pixie Casino, MD;  Location: [facility] ENDOSCOPY;  Service: Cardiovascular;  Laterality: N/A;   TONSILLECTOMY AND ADENOIDECTOMY     UPPER GASTROINTESTINAL ENDOSCOPY     dilation    reports that he has never smoked. He has never used  smokeless tobacco. He reports current alcohol use of about 2.0 standard drinks of alcohol per week. He reports that he does not use drugs. family history includes Breast cancer in his maternal grandmother; Dementia in his mother; Heart disease (age of onset: 30) in his father; Melanoma in his sister; Stroke in his maternal grandfather. Allergies  Allergen Reactions   Penicillin G Rash   Quinolones Other (See Comments)    Other reaction(s): Other (See Comments) Fluroquinolone antibiotics should be avoided in patients with history of aortic aneurysm/dissection   Amoxil [Amoxicillin] Rash   Current Outpatient Medications on File Prior to Visit  Medication Sig Dispense Refill   aspirin 81 MG EC tablet Take 1 tablet (81 mg total) by mouth daily. Swallow whole. 30 tablet 12   atorvastatin (LIPITOR) 80 MG tablet Take 1 tablet (80 mg total) by mouth daily. 90 tablet 3   b complex vitamins capsule Take 1 capsule by mouth daily.     lisinopril (ZESTRIL) 10 MG tablet Take 1 tablet (10 mg total) by mouth daily. 90 tablet 3   meloxicam (MOBIC) 15 MG tablet Take 15 mg by mouth as needed for pain. TAKE 0.5-1 tablet  AS NEEDED     metoprolol succinate (TOPROL-XL) 50 MG 24 hr tablet Take 1 tablet (50 mg total) by mouth daily. Take 37.'5mg'$  (1.5 tablets) daily for two weeks, then increase to '50mg'$  (2 tablets) daily. 90 tablet 3   VITAMIN D PO Take 1 tablet by mouth daily in the afternoon.     zolpidem (AMBIEN) 5 MG tablet TAKE 1 TABLET BY MOUTH AT BEDTIME AS NEEDED FOR SLEEP. 30 tablet 0   pregabalin (LYRICA) 75 MG capsule Take 1 capsule (75 mg total) by mouth at bedtime. 30 capsule 3   No current facility-administered medications on file prior to visit.        ROS:  All others reviewed and negative.  Objective        PE:  BP 130/90 (BP Location: Left Arm, Patient Position: Sitting, Cuff Size: Large)   Pulse (!) 58   Temp 97.8 F (36.6 C) (Oral)   Ht '6\' 5"'$  (1.956 m)   Wt 219 lb (99.3 kg)   SpO2 97%    BMI 25.97 kg/m                 Constitutional: Pt appears in NAD               HENT: Head: NCAT.                Right Ear: External ear normal.                 Left Ear: External ear normal.                Eyes: . Pupils are equal, round, and reactive to light. Conjunctivae and EOM are normal  Nose: without d/c or deformity               Neck: Neck supple. Gross normal ROM               Cardiovascular: Normal rate and irregular irregular rhythm.                 Pulmonary/Chest: Effort normal and breath sounds without rales or wheezing.                Abd:  Soft, NT, ND, + BS, no organomegaly               Neurological: Pt is alert. At baseline orientation, motor grossly intact               Skin: Skin is warm. No rashes, no other new lesions, LE edema - none               Psychiatric: Pt behavior is normal without agitation   Micro: none  Cardiac tracings I have personally interpreted today:  ECG - nsr 60  Pertinent Radiological findings (summarize): none   Lab Results  Component Value Date   WBC 3.8 (L) 05/23/2022   HGB 14.2 05/23/2022   HCT 42.2 05/23/2022   PLT 196.0 05/23/2022   GLUCOSE 97 05/23/2022   CHOL 84 05/23/2022   TRIG 55.0 05/23/2022   HDL 34.50 (L) 05/23/2022   LDLCALC 39 05/23/2022   ALT 31 05/23/2022   AST 26 05/23/2022   NA 140 05/23/2022   K 4.1 05/23/2022   CL 107 05/23/2022   CREATININE 1.13 05/23/2022   BUN 17 05/23/2022   CO2 30 05/23/2022   TSH 1.54 05/23/2022   PSA 1.31 05/23/2022   INR 1.19 04/01/2013   HGBA1C 5.6 05/23/2022   Assessment/Plan:  Wynonia Sours Eulalio Reamy. is a 76 y.o. White or Caucasian [1] male with  has a past medical history of Abdominal pain (04/01/2013), ALLERGIC RHINITIS (05/12/2008), Arthritis, Atrial fibrillation ([place]) (03/15/2010), Atrial fibrillation with RVR ([place]) (10/21/2012), B12 deficiency (06/15/2019), Back pain, Boil of buttock (06/24/11), BPH (benign prostatic hypertrophy) (09/05/2012), Cataract, Chest pain  with exertion presumed to be tachycardia related along with SOB (04/01/2013), ESOPHAGEAL STRICTURE (03/24/2009), Heart murmur, Hematuria, possible (04/01/2013), HTN (hypertension) (12/04/2013), HYPERLIPIDEMIA (08/18/2007), Insomnia (06/08/2016), INSOMNIA-SLEEP DISORDER-UNSPEC (05/14/2009), Mitral valve prolapse (07/04/2011), MRSA (methicillin resistant staph aureus) culture positive (5+ years ago), Neuromuscular disorder ([place]), OSA (obstructive sleep apnea) (07/04/2011), Palpitations (03/06/2007), PERIPHERAL NEUROPATHY (05/12/2008), Personal history of colonic polyps (05/12/2008), Preventative health care (06/24/2011), Sleep apnea, and Tuberous sclerosis ([place]) (06/08/2016).  B12 deficiency Lab Results  Component Value Date   VITAMINB12 162 (L) 11/22/2021   Low, to start oral replacement - b12 1000 mcg qd   Vitamin D deficiency Last vitamin D Lab Results  Component Value Date   VD25OH 30.90 11/22/2021  low, to start oral replacement   PAF (paroxysmal atrial fibrillation)  ECG reviewed - NSR, rate and volume ok, for card event monitor as suspect possible PAF  Ascending aortic aneurysm ([place]) Also followed at [facility] for this, stable per pt  HTN (hypertension) BP Readings from Last 3 Encounters:  05/23/22 130/90  05/19/22 112/70  03/07/22 125/82   Stable, pt to continue medical treatment lisinopril 10 mg, toprol xl 50 bid   HLD (hyperlipidemia) Lab Results  Component Value Date   LDLCALC 42 11/22/2021   Stable, pt to continue current statin liptior 80 mg   Encounter for well adult exam with abnormal findings Age and  sex appropriate education and counseling updated with regular exercise and diet Referrals for preventative services - none needed Immunizations addressed - none needed Smoking counseling  - none needed Evidence for depression or other mood disorder - none significant Most recent labs reviewed. I have personally reviewed and have noted: 1) the patient's medical and social  history 2) The patient's current medications and supplements 3) The patient's height, weight, and BMI have been recorded in the chart  Followup: Return in about 6 months (around 11/22/2022).  Cathlean Cower, MD 05/25/2022 10:14 PM [facility]

## 2022-05-23 NOTE — Assessment & Plan Note (Signed)
Last vitamin D Lab Results  Component Value Date   VD25OH 30.90 11/22/2021  low, to start oral replacement

## 2022-05-23 NOTE — Telephone Encounter (Signed)
Spoke with patient of Dr. Claiborne Billings who has h/o PAF. He reports occasional breakthrough episodes. He had episode of AFib that started last night and lasted into today. He saw his PCP who confirmed he was in AFib. He ordered a 2 week monitor and note said possibly resume Eliquis. Patient reports he had ablation some years ago. He reports he is out of AFib now. He is concerned about his stroke risk.  Scheduled for DOD visit on 6/21 @ 430pm with Dr. Claiborne Billings  Routed to MD to review

## 2022-05-25 ENCOUNTER — Ambulatory Visit (INDEPENDENT_AMBULATORY_CARE_PROVIDER_SITE_OTHER): Payer: Medicare HMO

## 2022-05-25 ENCOUNTER — Encounter: Payer: Self-pay | Admitting: Cardiovascular Disease

## 2022-05-25 ENCOUNTER — Encounter: Payer: Self-pay | Admitting: Internal Medicine

## 2022-05-25 ENCOUNTER — Ambulatory Visit: Payer: Medicare HMO | Admitting: Cardiovascular Disease

## 2022-05-25 VITALS — BP 114/86 | HR 55 | Ht 78.0 in | Wt 225.6 lb

## 2022-05-25 DIAGNOSIS — I480 Paroxysmal atrial fibrillation: Secondary | ICD-10-CM | POA: Diagnosis not present

## 2022-05-25 DIAGNOSIS — I77810 Thoracic aortic ectasia: Secondary | ICD-10-CM

## 2022-05-25 DIAGNOSIS — I341 Nonrheumatic mitral (valve) prolapse: Secondary | ICD-10-CM | POA: Diagnosis not present

## 2022-05-25 DIAGNOSIS — I10 Essential (primary) hypertension: Secondary | ICD-10-CM

## 2022-05-25 DIAGNOSIS — R002 Palpitations: Secondary | ICD-10-CM

## 2022-05-25 NOTE — Progress Notes (Unsigned)
Enrolled patient for a 14 day Zio XT  monitor to be mailed to patients home  °

## 2022-05-25 NOTE — Progress Notes (Signed)
Patient ID: Dustin Ferries., male   DOB: [birth year], 76 y.o.   MRN: [id number]     HPI: Dustin Hevener. is a 76 y.o. male who presents for a 12 month followup cardiology evaluation.     Dustin Berry has a long-standing history of paroxysmal atrial fibrillation. Initially in 2000 he was evaluated at [facility] and was treated at that time with flecainide. He subsequently developed breakthrough arrhythmia's and had done well with Rythmol SR and can, beta blocker. Remotely, he had seen Dr. Rosita Fire at [facility] as well as Dr. Glennon Mac at [facility] for consideration of atrial fibrillation ablation in 2011 at that time a decision not to have therapy. He had done well until recently but developed recurrent problems with recurrent atrial fibrillation despite medical therapy. He also has been diagnosed with obstructive sleep apnea and has been utilizing CPAP therapy with 100% compliance. He ultimately went back to [facility] and on June 6,2014 underwent pulmonary vein isolation of all pulmonary veins and radiofrequency ablation of CTI with bidirectional block noted with RA and CS pacing by Dr. Glennon Mac. He has been on eliquis 5 mg twice a day for anticoagulation. At that time he was taken off his multaq and metoprolol.  When I saw him in 2014 he started to notice white blood pressure elevation. He was unaware of any recurrent atrial fibrillation. He had been using his CPAP therapy. At that time, his blood pressure was 130/100. I elected to add low-dose lisinopril at 5 mg per day socially underwent a 2-D echo Doppler study on 11/21/2013. This showed an ejection fraction in the 45-50% range with grade 1 diastolic dysfunction. No definitive wall motion abnormalities were detected although the possibility was raised concerning possible mid inferior hypocontractility. He did have systolic bowing without definitive prolapse of his mitral valve with mild MR.    He underwent a one-year follow-up evaluation at [facility]  for his atrial fibrillation ablation.  He was maintaining sinus rhythm.  At that time, his anticoagulation was discontinued and he was resumed on baby aspirin for antiplatelet therapy.  He apparently has stopped using CPAP therapy over the past 3 years.  He denies any issues with his sleep.  He is unaware of any significant recurrent palpitations.  He has been monitoring his blood pressure at home and he states this is typically been running approximately 834 systolically.  He denies chest pain or palpitations.  He has back issues which has limited his exercise such that he predominantly just walks.  He no longer plays basketball.  When I saw him in February 2019 he was in the process of selling his house, and he is building a new house.  In the interim he was notusing his CPAP and that this is in a box preparing for his move.  He also has moved his [place] center out of [employer] to the [place] region.  He is unaware of any recurrent atrial fibrillation.  Occasionally he admitted to palpitations which he senses particularly when he lies on his side.  He underwent a follow-up echo Doppler study in February 2018 which showed an EF of 50-55% with grade 1 diastolic dysfunction.  His aortic root was mildly dilated and measured 4.5 cm.  There was mitral valve prolapse involving the anterior leaflet and posterior leaflet with mild MR.  There was mild biatrial enlargement.  In follow-up of his aortic root increase he underwent CT angios of his chest and aorta which showed a  4.3 cm a sending thoracic aortic aneurysm on 03/02/2017.  He was on lisinopril 5 mg for hypertension, atorvastatin 20 for hyperlipidemia.    I saw him on 08/05/2019 at which time he had remained stable from a cardiac perspective.  He was unaware of any recurrent arrhythmia.  He denied any chest pain. He had noticed his blood pressures slightly elevated and labile with typical blood pressures around 638 systolically.  He is unaware of any  breakthrough atrial fibrillation.    He underwent laboratory with Dr. Jenny Reichmann in February 2020.  Total cholesterol was 149, HDL 40.9, LDL 95, triglycerides 67.  When I last saw him, I recommended titration of lisinopril and apparently subsequent to that evaluation his dose was further titrated to 10 mg daily.  I was saw him in October 2021 and over the prior year he had remained stable from a cardiac standpoint.  He was unaware of any recurrent atrial fibrillation.  However there have been times we has noticed some slight increase in palpitations particularly if he would drink 2 to 3 glasses of caffeinated tea.  With caffeine reduction these have improved.  Recently, he has been trying to eat better.  He continued to work approximately 5 and half days per week at his [employer] which has moved to the [place] area.  He typically walks about [phone] steps per day usually while at work but denies any significant routine exercise.  He does cut his grass and work in his yard on weekends.  Over the past year he underwent right thumb surgery by Dr. Amedeo Plenty.  He denied chest pain or change in exertional symptomatology.  He saw Dr. Jenny Reichmann for his primary care evaluation but no laboratory was obtained this year.  His last blood work was done in February 2020.    I last saw him on June 01, 2021.  He continued to work at his [employer].  He has been having some issues with his SI joint and is recently been evaluated by Dr. Noah Delaine at [facility].  At times he notes an occasional isolated palpitation but at times he may notice brief burst of heart rate irregularity.  He has been taking metoprolol succinate 25 mg daily as well as lisinopril 10 mg for hypertension.  He has been taking meloxicam for his arthritic issues.  He continues to be on atorvastatin 40 mg daily.  He denies any chest pain PND orthopnea.  He denies any shortness of breath.  His blood pressure at home has been running in the upper 130s to 140 range.   At his evaluation, I recommended that he undergo a follow-up echo Doppler study.  I also titrated his metoprolol from 25 mg daily to 37.5 mg for the next 2 weeks and depending upon blood pressure and heart rate response increased to 50 mg if necessary.  He underwent an echo Doppler study on June 28, 2021.  He was found to have dilated aortic root and thoracic aortic aneurysm.  EF by 3D volume was 46%.  There was grade 1 diastolic dysfunction.  It was recommended that he undergo ECG gated CTA of his aorta for further evaluation of his aortic ascending aortic aneurysm with particular attention to the sinus of Valsalva.  I recommended that he see Dr. Gilford Raid for initial evaluation.  Apparently on that particular day Dr. Cyndia Bent was unable to be in the office and he was seen by PA.  He subsequently was evaluated at [facility] by  Dr. Mali Haynes.  And had a follow-up 2D echo Doppler study done in April 2023 which showed an EF at 50%.  Left atrium was 4.3 cm.  There was mild mitral regurgitation mild aortic regurgitation.  Ascending aorta measured 4.6 cm.  He is scheduled to have follow-up with him in 1 year.  Dustin Berry states his blood pressure at home has been in the 120/80 range.  At times he has noticed some palpitations and was wondering if he had gone back into atrial fibrillation.  Upon further questioning today, it may not be that he went into atrial fibrillation but perhaps may have had's premature beats.  He was evaluated by Dr. Jenny Reichmann on June 19 following an episode of palpitations Sunday night and that morning.  Apparently he was in sinus rhythm at the time.  He underwent laboratory and is to undergo a 2-week cardiac monitoring.  Presently he denies any chest pain.  He denies any exertional dyspnea.  He has been on baby aspirin and apparently remotely his anticoagulation was discontinued.  He continues to be on metoprolol succinate 50 mg daily as well as atorvastatin 80 mg.  He takes Lyrica at  bedtime and is also on lisinopril 10 mg daily.  He presents for evaluation.  Past Medical History:  Diagnosis Date   Abdominal pain 04/01/2013   ALLERGIC RHINITIS 05/12/2008   Arthritis    Atrial fibrillation ([place]) 03/15/2010   14 day event monitor - 1 episode of sinus bradycardia   Atrial fibrillation with RVR ([place]) 10/21/2012   Echo- EF 50-55%; mild concentric LVH; flow pattern suggestive of impaired LV relaxation; mild mitral valve prolapse, trace mitral regurgitation   B12 deficiency 06/15/2019   Back pain    receiving PT   Boil of buttock 06/24/11   BPH (benign prostatic hypertrophy) 09/05/2012   Cataract    Chest pain with exertion presumed to be tachycardia related along with SOB 04/01/2013   ESOPHAGEAL STRICTURE 03/24/2009   Heart murmur    Hematuria, possible 04/01/2013   HTN (hypertension) 12/04/2013   HYPERLIPIDEMIA 08/18/2007   Insomnia 06/08/2016   INSOMNIA-SLEEP DISORDER-UNSPEC 05/14/2009   Mitral valve prolapse 07/04/2011   MRSA (methicillin resistant staph aureus) culture positive 5+ years ago   Neuromuscular disorder ([place])    peripheral neuropathy   OSA (obstructive sleep apnea) 07/04/2011   sleep study - average AHI 2.5   Palpitations 03/06/2007   R/P MV - mild perfusion defect in basal inferoseptal, basal inferior, mid inferoseptal, and mid inferior regions, consistent w/ infarct/scar; no scintigraphic evidence of inducible myocardial ischemia; prior non transmural infarct cannot be completely excluded; EF 48%; no significant change from previous study   PERIPHERAL NEUROPATHY 05/12/2008   Personal history of colonic polyps 05/12/2008   Preventative health care 06/24/2011   Sleep apnea    pt denies   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    Past Surgical History:  Procedure Laterality Date   ATRIAL FIBRILLATION ABLATION     CARDIOVERSION N/A 04/03/2013   Procedure: CARDIOVERSION;  Surgeon: Pixie Casino, MD;  Location: [facility] ENDOSCOPY;  Service: Cardiovascular;  Laterality: N/A;    COLONOSCOPY  2018   HAND SURGERY     Thumb joint repair   POLYPECTOMY     TEE WITHOUT CARDIOVERSION N/A 04/03/2013   Procedure: TRANSESOPHAGEAL ECHOCARDIOGRAM (TEE);  Surgeon: Pixie Casino, MD;  Location: [facility] ENDOSCOPY;  Service: Cardiovascular;  Laterality: N/A;   TONSILLECTOMY AND ADENOIDECTOMY     UPPER GASTROINTESTINAL ENDOSCOPY  dilation    Allergies  Allergen Reactions   Penicillin G Rash   Quinolones Other (See Comments)    Other reaction(s): Other (See Comments) Fluroquinolone antibiotics should be avoided in patients with history of aortic aneurysm/dissection   Amoxil [Amoxicillin] Rash    Current Outpatient Medications  Medication Sig Dispense Refill   aspirin 81 MG EC tablet Take 1 tablet (81 mg total) by mouth daily. Swallow whole. 30 tablet 12   atorvastatin (LIPITOR) 80 MG tablet Take 1 tablet (80 mg total) by mouth daily. 90 tablet 3   b complex vitamins capsule Take 1 capsule by mouth daily.     lisinopril (ZESTRIL) 10 MG tablet Take 1 tablet (10 mg total) by mouth daily. 90 tablet 3   meloxicam (MOBIC) 15 MG tablet Take 15 mg by mouth as needed for pain. TAKE 0.5-1 tablet  AS NEEDED     metoprolol succinate (TOPROL-XL) 50 MG 24 hr tablet Take 1 tablet (50 mg total) by mouth daily. Take 37.1m (1.5 tablets) daily for two weeks, then increase to 539m(2 tablets) daily. 90 tablet 3   pregabalin (LYRICA) 75 MG capsule Take 1 capsule (75 mg total) by mouth at bedtime. 30 capsule 3   VITAMIN D PO Take 1 tablet by mouth daily in the afternoon.     zolpidem (AMBIEN) 5 MG tablet TAKE 1 TABLET BY MOUTH AT BEDTIME AS NEEDED FOR SLEEP. 30 tablet 0   No current facility-administered medications for this visit.    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  Tobacco Use   Smoking status: Never   Smokeless tobacco: Never  Vaping Use   Vaping Use: Never used  Substance and Sexual Activity   Alcohol use: Yes    Alcohol/week: 2.0 standard drinks of alcohol    Types: 2 Standard drinks or equivalent per week   Drug use: No   Sexual activity: Not on file  Other Topics Concern   Not on file  Social History Narrative   Not on file   Social Determinants of Health   Financial Resource Strain: Not on file  Food Insecurity: Not on file  Transportation Needs: Not on file  Physical Activity: Not on file  Stress: Not on file  Social Connections: Not on file  Intimate Partner Violence: Not on file    Family History  Problem Relation Age of Onset   Heart disease Father [id number]     died with MI   Melanoma Sister    Dementia Mother    Stroke Maternal Grandfather    Breast cancer Maternal Grandmother    Colon cancer Neg Hx    Esophageal cancer Neg Hx    Stomach cancer Neg Hx    Rectal cancer Neg Hx    Colon polyps Neg Hx    Social history is notable in that he is married has 4 children. He never smoked cigarettes. He does try to exercise. Previously had played basketball. 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 [facility]   ROS General: Negative; No fevers, chills, or night sweats;  HEENT: Negative; No changes in vision or hearing, sinus congestion, difficulty swallowing Pulmonary: Negative; No cough, wheezing,  shortness of breath, hemoptysis Cardiovascular: See history of present illness GI: Negative; No nausea, vomiting, diarrhea, or abdominal pain GU: Negative; No dysuria, hematuria, or difficulty voiding Musculoskeletal: Negative; no myalgias, joint pain, or weakness Hematologic/Oncology: Negative; no easy bruising, bleeding Endocrine: Negative; no heat/cold intolerance; no diabetes Neuro: Negative; no changes in balance, headaches Skin: Negative; No rashes or skin  lesions Psychiatric: Negative; No behavioral problems, depression Sleep: h/o obstructive sleep apnea treated with CPAP.  Last sleep study in March 2018 showed increased syndrome without significant sleep apnea.  No snoring, daytime sleepiness, hypersomnolence, bruxism, restless legs, hypnogognic hallucinations, no cataplexy Other comprehensive 14 point system review is negative.   PE BP 114/86 (BP Location: Left Arm, Patient Position: Sitting, Cuff Size: Large)   Pulse (!) 55   Ht '6\' 6"'  (1.981 m)   Wt 225 lb 9.6 oz (102.3 kg)   SpO2 97%   BMI 26.07 kg/m    Repeat blood pressure by me was 136/82.  Wt Readings from Last 3 Encounters:  05/25/22 225 lb 9.6 oz (102.3 kg)  05/23/22 219 lb (99.3 kg)  05/19/22 218 lb 12.8 oz (99.2 kg)   General: Alert, oriented, no distress.  Skin: normal turgor, no rashes, warm and dry HEENT: Normocephalic, atraumatic. Pupils equal round and reactive to light; sclera anicteric; extraocular muscles intact; Fundi ** Nose without nasal septal hypertrophy Mouth/Parynx benign; Mallinpatti scale 3 Neck: No JVD, no carotid bruits; normal carotid upstroke Lungs: clear to ausculatation and percussion; no wheezing or rales Chest wall: without tenderness to palpitation Heart: PMI not displaced, RRR, s1 s2 normal, 1/6 systolic murmur, no diastolic murmur, no rubs, gallops, thrills, or heaves Abdomen: soft, nontender; no hepatosplenomehaly, BS+; abdominal aorta nontender and not dilated by palpation. Back: no CVA tenderness Pulses 2+ Musculoskeletal: full range of motion, normal strength, no joint deformities Extremities: no clubbing cyanosis or edema, Homan's sign negative  Neurologic: grossly nonfocal; Cranial nerves grossly wnl Psychologic: Normal mood and affect    May 25, 2022 ECG (independently read by me): Sinus bradycardia at 55, no ectopy  June 01, 2021 ECG (independently read by me): Normal sinus rhythm at 69 bpm with an isolated PVC.  Nonspecific  ST abnormality.  QTc interval 454 ms.  October 2021 ECG (independently read by me): NSR ay 60; NSST changes; QTc 430 msec  August 2020 ECG (independently read by me): Normal sinus rhythm at 62 bpm.  Nonspecific T changes.  QTc interval normal at 414 ms.  February 2019 ECG (independently read by me): Normal sinus rhythm at 70 bpm.  Isolated PVC.  Nonspecific ST changes.  QTc interval 425 ms.  January 2018 ECG (independently read by me): Normal sinus rhythm at 73 bpm.  PR interval 170 ms, QTc interval 423 ms.  Nonspecific ST-T changes.  December 2014 ECG: Normal sinus rhythm at 66 beats per minute. Nonspecific ST-T changes. QTc interval 429 ms.  LABS:     Latest Ref Rng & Units 05/23/2022    2:45 PM 11/22/2021   11:10 AM 07/12/2021    8:56 AM  BMP  Glucose 70 - 99 mg/dL 97  90    BUN 6 - 23 mg/dL 17  15    Creatinine 0.40 - 1.50 mg/dL 1.13  1.15  1.10   Sodium 135 - 145 mEq/L 140  144    Potassium 3.5 - 5.1 mEq/L 4.1  4.3    Chloride 96 - 112 mEq/L 107  108    CO2 19 - 32 mEq/L 30  29    Calcium 8.4 - 10.5 mg/dL 9.5  9.6        Latest Ref Rng & Units 05/23/2022    2:45 PM 11/22/2021   11:10 AM 10/23/2020   10:00 AM  Hepatic Function  Total Protein 6.0 - 8.3 g/dL 6.1  6.5  7.1   Albumin 3.5 - 5.2 g/dL 3.9  4.0  4.3   AST 0 - 37 U/L '26  19  19   ' ALT 0 - 53 U/L '31  22  21   ' Alk Phosphatase 39 - 117 U/L 70  61  59   Total Bilirubin 0.2 - 1.2 mg/dL 0.6  0.9  0.7   Bilirubin, Direct 0.0 - 0.3 mg/dL 0.2  0.2  0.1       Latest Ref Rng & Units 05/23/2022    2:45 PM 11/22/2021   11:10 AM 10/23/2020   10:00 AM  CBC  WBC 4.0 - 10.5 K/uL 3.8  4.6  5.8   Hemoglobin 13.0 - 17.0 g/dL 14.2  15.0  15.4   Hematocrit 39.0 - 52.0 % 42.2  45.2  46.0   Platelets 150.0 - 400.0 K/uL 196.0  219.0  253.0    Lab Results  Component Value Date   MCV 90.6 05/23/2022   MCV 91.7 11/22/2021   MCV 88.0 10/23/2020   Lab Results  Component Value Date   TSH 1.54 05/23/2022   Lipid Panel      Component Value Date/Time   CHOL 84 05/23/2022 1445   CHOL 178 09/18/2020 1038   TRIG 55.0 05/23/2022 1445   HDL 34.50 (L) 05/23/2022 1445   HDL 40 09/18/2020 1038   CHOLHDL 2 05/23/2022 1445   VLDL 11.0 05/23/2022 1445   LDLCALC 39 05/23/2022 1445   LDLCALC 122 (H) 09/18/2020 1038    RADIOLOGY: No results found.  IMPRESSION:  1. PAF (paroxysmal atrial fibrillation) ([place]): Status post A-fib ablation with pulmonary vein isolation in 2014   2. Ascending aorta dilation ([facility])   3. Aortic root dilatation ([facility])   4. Palpitations   5. Essential hypertension   6. Mitral valve prolapse with mild MR     ASSESSMENT AND PLAN: Dustin Berry is a 76 year-old Caucasian male who has a history of paroxysmal atrial fibrillation dating back to 2000 when he was initiated on therapy at [facility].  He underwent successful radiofrequency AF ablation with pulmonary vein isolation in June 2014 by Dr. Norm Salt at [facility]. He has been maintaining sinus rhythm.  He had been on eliquis anticoagulation and this was discontinued by his [place] and now he is just on aspirin 81 mg.  An echo Doppler study in  2014 showed an ejection fraction of 45-50%.  The last echo of February 2018  showed an EF of 19-37%, grade 1 diastolic dysfunction, mitral valve prolapse involving both leaflets with mild MR, mild biatrial enlargement, and mild dilation of his ascending aorta at 4.5 cm.  A CT angio of his chest showed a 4.3 cm descending thoracic aortic aneurysm.  He continues to be asymptomatic with reference to chest pain.  When last seen by me in June 2022 his blood pressure was elevated and he had experienced some mild palpitations.  I subsequently further titrated his metoprolol succinate to 37.5 mg and then to 50 mg daily.  An echo Doppler study was recommended and this revealed EF at 46% of raise concern for dilated aortic root and thoracic aortic aneurysm.  He has  subsequently been evaluated at [facility] by Dr.  Mali Haynes and had repeat echo Doppler assessment in April 2023.  This past weekend he had noticed some palpitations on Sunday evening and Monday morning which led to a evaluation with Dr. Cathlean Cower later that day.  Apparently he was in sinus rhythm.  I question whether or not he was having possible PACs rather than recurrent AF.  A 2-week Zio patch will be scheduled for further evaluation.  If he does have breakthrough AF I would recommend reinitiation of anticoagulation.  Presently he will continue his current dose of aspirin.  I reviewed his most recent laboratory.  He has been taking vitamin D on a daily basis.  Most recent vitamin D is elevated now at 108 it was recommended he reduce his supplementation.  Lipid studies remain excellent with total cholesterol 84 HDL 34 and LDL 39 with triglycerides 55.  Hemoglobin A1c is 5.6.  I will see him in 6 months for follow-up evaluation or sooner as needed.    Dustin Sine, MD, [facility]  05/25/2022 5:52 PM

## 2022-05-25 NOTE — Patient Instructions (Signed)
Medication Instructions:  Your physician recommends that you continue on your current medications as directed. Please refer to the Current Medication list given to you today.  *If you need a refill on your cardiac medications before your next appointment, please call your pharmacy*  Lab Work: NONE ordered at this time of appointment   If you have labs (blood work) drawn today and your tests are completely normal, you will receive your results only by: [facility] (if you have MyChart) OR A paper copy in the mail If you have any lab test that is abnormal or we need to change your treatment, we will call you to review the results.  Testing/Procedures:  [facility]- Long Term Monitor Instructions  Your physician has requested you wear a ZIO patch monitor for 14 days.  This is a single patch monitor. [employer] supplies one patch monitor per enrollment. Additional stickers are not available. Please do not apply patch if you will be having a Nuclear Stress Test,  Echocardiogram, Cardiac CT, MRI, or Chest Xray during the period you would be wearing the  monitor. The patch cannot be worn during these tests. You cannot remove and re-apply the  ZIO XT patch monitor.  Your ZIO patch monitor will be mailed 3 day USPS to your address on file. It may take 3-5 days  to receive your monitor after you have been enrolled.  Once you have received your monitor, please review the enclosed instructions. Your monitor  has already been registered assigning a specific monitor serial # to you.  Billing and Patient Assistance Program Information  We have supplied [employer] with any of your insurance information on file for billing purposes. [employer] offers a sliding scale Patient Assistance Program for patients that do not have  insurance, or whose insurance does not completely cover the cost of the ZIO monitor.  You must apply for the Patient Assistance Program to qualify for this discounted rate.  To apply,  please call [employer] at [phone], select option 4, select option 2, ask to apply for  Patient Assistance Program. Theodore Demark will ask your household income, and how many people  are in your household. They will quote your out-of-pocket cost based on that information.  [employer] will also be able to set up a 67-month interest-free payment plan if needed.  Applying the monitor   Shave hair from upper left chest.  Hold abrader disc by orange tab. Rub abrader in 40 strokes over the upper left chest as  indicated in your monitor instructions.  Clean area with 4 enclosed alcohol pads. Let dry.  Apply patch as indicated in monitor instructions. Patch will be placed under collarbone on left  side of chest with arrow pointing upward.  Rub patch adhesive wings for 2 minutes. Remove white label marked "1". Remove the white  label marked "2". Rub patch adhesive wings for 2 additional minutes.  While looking in a mirror, press and release button in center of patch. A small green light will  flash 3-4 times. This will be your only indicator that the monitor has been turned on.  Do not shower for the first 24 hours. You may shower after the first 24 hours.  Press the button if you feel a symptom. You will hear a small click. Record Date, Time and  Symptom in the Patient Logbook.  When you are ready to remove the patch, follow instructions on the last 2 pages of Patient  Logbook. Stick patch monitor onto the last page of  Patient Logbook.  Place Patient Logbook in the blue and white box. Use locking tab on box and tape box closed  securely. The blue and white box has prepaid postage on it. Please place it in the mailbox as  soon as possible. Your physician should have your test results approximately 7 days after the  monitor has been mailed back to [facility].  Call [place] at [phone] if you have questions regarding  your ZIO XT patch monitor. Call them immediately if you see  an orange light blinking on your  monitor.  If your monitor falls off in less than 4 days, contact our Monitor department at [phone].  If your monitor becomes loose or falls off after 4 days call [employer] at [phone] for  suggestions on securing your monitor   Follow-Up: At [facility], you and your health needs are our priority.  As part of our continuing mission to provide you with exceptional heart care, we have created designated Provider Care Teams.  These Care Teams include your primary Cardiologist (physician) and Advanced Practice Providers (APPs -  Physician Assistants and Nurse Practitioners) who all work together to provide you with the care you need, when you need it.     Your next appointment:   6 month(s)  The format for your next appointment:   In Person  Provider:   Shelva Majestic, MD     Other Instructions   Important Information About Sugar

## 2022-05-25 NOTE — Assessment & Plan Note (Signed)

## 2022-05-26 ENCOUNTER — Other Ambulatory Visit: Payer: Self-pay | Admitting: Cardiovascular Disease

## 2022-05-28 DIAGNOSIS — I480 Paroxysmal atrial fibrillation: Secondary | ICD-10-CM | POA: Diagnosis not present

## 2022-06-02 ENCOUNTER — Other Ambulatory Visit: Payer: Self-pay | Admitting: Cardiovascular Disease

## 2022-06-13 ENCOUNTER — Telehealth: Payer: Self-pay | Admitting: Neurology

## 2022-06-13 DIAGNOSIS — E785 Hyperlipidemia, unspecified: Secondary | ICD-10-CM | POA: Diagnosis not present

## 2022-06-13 DIAGNOSIS — Z8673 Personal history of transient ischemic attack (TIA), and cerebral infarction without residual deficits: Secondary | ICD-10-CM | POA: Diagnosis not present

## 2022-06-13 DIAGNOSIS — Z825 Family history of asthma and other chronic lower respiratory diseases: Secondary | ICD-10-CM | POA: Diagnosis not present

## 2022-06-13 DIAGNOSIS — Z8249 Family history of ischemic heart disease and other diseases of the circulatory system: Secondary | ICD-10-CM | POA: Diagnosis not present

## 2022-06-13 DIAGNOSIS — Z880 Allergy status to penicillin: Secondary | ICD-10-CM | POA: Diagnosis not present

## 2022-06-13 DIAGNOSIS — Z7982 Long term (current) use of aspirin: Secondary | ICD-10-CM | POA: Diagnosis not present

## 2022-06-13 DIAGNOSIS — N529 Male erectile dysfunction, unspecified: Secondary | ICD-10-CM | POA: Diagnosis not present

## 2022-06-13 DIAGNOSIS — I10 Essential (primary) hypertension: Secondary | ICD-10-CM | POA: Diagnosis not present

## 2022-06-13 DIAGNOSIS — M1990 Unspecified osteoarthritis, unspecified site: Secondary | ICD-10-CM | POA: Diagnosis not present

## 2022-06-13 DIAGNOSIS — Z791 Long term (current) use of non-steroidal anti-inflammatories (NSAID): Secondary | ICD-10-CM | POA: Diagnosis not present

## 2022-06-13 DIAGNOSIS — G629 Polyneuropathy, unspecified: Secondary | ICD-10-CM | POA: Diagnosis not present

## 2022-06-15 ENCOUNTER — Encounter: Payer: Self-pay | Admitting: Neurology

## 2022-06-15 ENCOUNTER — Ambulatory Visit: Payer: Medicare HMO | Admitting: Neurology

## 2022-06-15 VITALS — BP 110/79 | HR 57 | Ht 78.0 in | Wt 221.0 lb

## 2022-06-15 DIAGNOSIS — E538 Deficiency of other specified B group vitamins: Secondary | ICD-10-CM | POA: Diagnosis not present

## 2022-06-15 DIAGNOSIS — G3184 Mild cognitive impairment, so stated: Secondary | ICD-10-CM

## 2022-06-15 DIAGNOSIS — G459 Transient cerebral ischemic attack, unspecified: Secondary | ICD-10-CM | POA: Diagnosis not present

## 2022-06-15 MED ORDER — RIVASTIGMINE TARTRATE 1.5 MG PO CAPS: 1.5000 mg | ORAL_CAPSULE | Freq: Two times a day (BID) | ORAL | 0 refills | Status: DC

## 2022-06-15 NOTE — Patient Instructions (Addendum)
Start with Rivastigmine 1.5 mg BID for one month then increase to 3 mg BID  Hold Pregabalin to see if calf pain resolved, if resolved, then discontinue Pregabalin Follow up in February 2024 after completion of neuropsychiatry testing.

## 2022-06-15 NOTE — Progress Notes (Signed)
[facility]  PATIENT: Dustin Berry. DOB: 11/18/1946  REQUESTING CLINICIAN: Biagio Borg, MD HISTORY FROM: Patient and Spouse  REASON FOR VISIT: Memory problem    HISTORICAL  CHIEF COMPLAINT:  Chief Complaint  Patient presents with   Follow-up    EMG 4 - alone. Four month follow up for memory. No changes. Recent MOCA 21/30. He would like to discuss medications.    INTERVAL HISTORY 06/15/2022:  Patient presents today for follow-up, he presents alone.  At last visit due to his bradycardia we defer starting Aricept or any medications until completion of the neuropsych testing.  He reported after going home and reading about the medication, he would like to start it now.  I did inform him that his bradycardia is a relative contraindication to Aricept but we can start rivastigmine. He is comfortable with plan. His neuropsych testing is scheduled for January.  He also mentioned that since starting the pregabalin, now when walking he will have cramp-like pain in bilateral calf and his nighttime pain is not well controlled.    INTERVAL HISTORY 03/07/2022:  Patient presents today for follow-up, at last visit plan was to start pregabalin for his neuropathic pain.  He reported the pain improved.  Today he is concerned for his memory.  He reported he has issue with recall, his long-term memory and short-term memory are intact.  Wife thinks he does not do all the tasks he supposed to do, he does forget a lot.  There was time at home where he forget and left the stove on, burning some pots.  He still drives, does not have any recent accident or being lost in family or places.  He reported mother had a history of Alzheimer's disease, diagnosed in the 46s.  He still independent, able to for perform all ADLs and a IADLs.  Denies any word finding difficulty denies forgetting names of family members.  Wife took over the finances a few years ago because he did forget to pay bills on  time.   HISTORY OF PRESENT ILLNESS:  This is a 76 year old gentleman with past medical history of hypertension hyperlipidemia, previously atrial fibrillation status post ablation who was referred by PMD for TIA work up.  Patient reports 6 weeks ago while at a computer desk he noted  binocular double vision, on closing 1 eye his vision will be back to normal but with both eyes he will have double vision.  The entire episode lasted about 3 minutes then resolved.  There were no associated symptoms with the blurry vision, denies any headaches, denies any numbness, no weakness and no slurred speech.  He never experienced an episode like that in the past and has not had any additional episodes.  He did follow-up with his primary care doctor who obtained a stroke labs and refer him to neurology for TIA work-up.  His lipid panel was within normal limits and a hemoglobin A1c was 5.7.  He denies any previous history of strokes.     OTHER MEDICAL CONDITIONS: HLD, HTN, CAD   REVIEW OF SYSTEMS: Full 14 system review of systems performed and negative with exception of: as noted in the HPI   ALLERGIES: Allergies  Allergen Reactions   Penicillin G Rash   Quinolones Other (See Comments)    Other reaction(s): Other (See Comments) Fluroquinolone antibiotics should be avoided in patients with history of aortic aneurysm/dissection   Amoxil [Amoxicillin] Rash    HOME MEDICATIONS: Outpatient Medications Prior to Visit  Medication Sig Dispense Refill   aspirin 81 MG EC tablet Take 1 tablet (81 mg total) by mouth daily. Swallow whole. 30 tablet 12   atorvastatin (LIPITOR) 80 MG tablet Take 1 tablet (80 mg total) by mouth daily. 90 tablet 3   b complex vitamins capsule Take 1 capsule by mouth daily.     lisinopril (ZESTRIL) 10 MG tablet TAKE 1 TABLET BY MOUTH EVERY DAY 90 tablet 3   meloxicam (MOBIC) 15 MG tablet Take 15 mg by mouth as needed for pain. TAKE 0.5-1 tablet  AS NEEDED     metoprolol succinate  (TOPROL-XL) 25 MG 24 hr tablet TAKE 37.'5MG'$  (1.5 TABLETS) DAILY FOR TWO WEEKS, THEN INCREASE TO '50MG'$  (2 TABLETS) DAILY. 180 tablet 3   metoprolol succinate (TOPROL-XL) 50 MG 24 hr tablet Take 1 tablet (50 mg total) by mouth daily. Take 37.'5mg'$  (1.5 tablets) daily for two weeks, then increase to '50mg'$  (2 tablets) daily. 90 tablet 3   VITAMIN D PO Take 1 tablet by mouth daily in the afternoon.     zolpidem (AMBIEN) 5 MG tablet TAKE 1 TABLET BY MOUTH AT BEDTIME AS NEEDED FOR SLEEP. 30 tablet 0   pregabalin (LYRICA) 75 MG capsule Take 1 capsule (75 mg total) by mouth at bedtime. 30 capsule 3   No facility-administered medications prior to visit.    PAST MEDICAL HISTORY: Past Medical History:  Diagnosis Date   Abdominal pain 04/01/2013   ALLERGIC RHINITIS 05/12/2008   Arthritis    Atrial fibrillation ([place]) 03/15/2010   14 day event monitor - 1 episode of sinus bradycardia   Atrial fibrillation with RVR ([place]) 10/21/2012   Echo- EF 50-55%; mild concentric LVH; flow pattern suggestive of impaired LV relaxation; mild mitral valve prolapse, trace mitral regurgitation   B12 deficiency 06/15/2019   Back pain    receiving PT   Boil of buttock 06/24/11   BPH (benign prostatic hypertrophy) 09/05/2012   Cataract    Chest pain with exertion presumed to be tachycardia related along with SOB 04/01/2013   ESOPHAGEAL STRICTURE 03/24/2009   Heart murmur    Hematuria, possible 04/01/2013   HTN (hypertension) 12/04/2013   HYPERLIPIDEMIA 08/18/2007   Insomnia 06/08/2016   INSOMNIA-SLEEP DISORDER-UNSPEC 05/14/2009   Mitral valve prolapse 07/04/2011   MRSA (methicillin resistant staph aureus) culture positive 5+ years ago   Neuromuscular disorder ([place])    peripheral neuropathy   OSA (obstructive sleep apnea) 07/04/2011   sleep study - average AHI 2.5   Palpitations 03/06/2007   R/P MV - mild perfusion defect in basal inferoseptal, basal inferior, mid inferoseptal, and mid inferior regions, consistent w/ infarct/scar; no  scintigraphic evidence of inducible myocardial ischemia; prior non transmural infarct cannot be completely excluded; EF 48%; no significant change from previous study   PERIPHERAL NEUROPATHY 05/12/2008   Personal history of colonic polyps 05/12/2008   Preventative health care 06/24/2011   Sleep apnea    pt denies   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    PAST SURGICAL HISTORY: Past Surgical History:  Procedure Laterality Date   ATRIAL FIBRILLATION ABLATION     CARDIOVERSION N/A 04/03/2013   Procedure: CARDIOVERSION;  Surgeon: Pixie Casino, MD;  [facility];  Service: Cardiovascular;  Laterality: N/A;   COLONOSCOPY  2018   HAND SURGERY     Thumb joint repair   POLYPECTOMY     TEE WITHOUT CARDIOVERSION N/A 04/03/2013   Procedure: TRANSESOPHAGEAL ECHOCARDIOGRAM (TEE);  Surgeon: Pixie Casino, MD;  Location: [facility] ENDOSCOPY;  Service: Cardiovascular;  Laterality: N/A;   TONSILLECTOMY AND ADENOIDECTOMY     UPPER GASTROINTESTINAL ENDOSCOPY     dilation    FAMILY HISTORY: Family History  Problem Relation Age of Onset   Heart disease Father 10       died with MI   Melanoma Sister    Dementia Mother    Stroke Maternal Grandfather    Breast cancer Maternal Grandmother    Colon cancer Neg Hx    Esophageal cancer Neg Hx    Stomach cancer Neg Hx    Rectal cancer Neg Hx    Colon polyps Neg Hx     SOCIAL HISTORY: Social History   Socioeconomic History   Marital status: Married    Spouse name: Not on file   Number of children: Not on file   Years of education: Not on file   Highest education level: Not on file  Occupational History   Occupation: semi-retired Tourist information centre manager, former self employed tech co support  Tobacco Use   Smoking status: Never   Smokeless tobacco: Never  Vaping Use   Vaping Use: Never used  Substance and Sexual Activity   Alcohol use: Yes    Alcohol/week: 2.0 standard drinks of alcohol    Types: 2 Standard drinks or equivalent per week   Drug use: No    Sexual activity: Not on file  Other Topics Concern   Not on file  Social History Narrative   Not on file   Social Determinants of Health   Financial Resource Strain: Not on file  Food Insecurity: Not on file  Transportation Needs: Not on file  Physical Activity: Not on file  Stress: Not on file  Social Connections: Not on file  Intimate Partner Violence: Not on file     PHYSICAL EXAM  GENERAL EXAM/CONSTITUTIONAL: Vitals:  Vitals:   06/15/22 1347  BP: 110/79  Pulse: (!) 57  Weight: 221 lb (100.2 kg)  Height: '6\' 6"'$  (1.981 m)   Body mass index is 25.54 kg/m. Wt Readings from Last 3 Encounters:  06/15/22 221 lb (100.2 kg)  05/25/22 225 lb 9.6 oz (102.3 kg)  05/23/22 219 lb (99.3 kg)   Patient is in no distress; well developed, nourished and groomed; neck is supple   EYES: Pupils round and reactive to light, Visual fields full to confrontation, Extraocular movements intacts,   MUSCULOSKELETAL: Gait, strength, tone, movements noted in Neurologic exam below  NEUROLOGIC: MENTAL STATUS:      No data to display         awake, alert, oriented to person, place and time recent and remote memory intact normal attention and concentration language fluent, comprehension intact, naming intact fund of knowledge appropriate     03/07/2022   10:21 AM  Montreal Cognitive Assessment   Visuospatial/ Executive (0/5) 4  Naming (0/3) 2  Attention: Read list of digits (0/2) 2  Attention: Read list of letters (0/1) 1  Attention: Serial 7 subtraction starting at 100 (0/3) 2  Language: Repeat phrase (0/2) 1  Language : Fluency (0/1) 1  Abstraction (0/2) 2  Delayed Recall (0/5) 0  Orientation (0/6) 6  Total 21  Adjusted Score (based on education) 21    CRANIAL NERVE:  2nd, 3rd, 4th, 6th - pupils equal and reactive to light, visual fields full to confrontation, extraocular muscles intact, no nystagmus 5th - facial sensation symmetric 7th - facial strength  symmetric 8th - hearing intact 9th - palate elevates symmetrically, uvula midline 11th - shoulder  shrug symmetric 12th - tongue protrusion midline  MOTOR:  normal bulk and tone, full strength in the BUE, BLE  SENSORY:  normal and symmetric to light touch, pinprick, temperature, vibration  COORDINATION:  finger-nose-finger, fine finger movements normal  REFLEXES:  deep tendon reflexes present and symmetric  GAIT/STATION:  normal   DIAGNOSTIC DATA (LABS, IMAGING, TESTING) - I reviewed patient records, labs, notes, testing and imaging myself where available.  Lab Results  Component Value Date   WBC 3.8 (L) 05/23/2022   HGB 14.2 05/23/2022   HCT 42.2 05/23/2022   MCV 90.6 05/23/2022   PLT 196.0 05/23/2022      Component Value Date/Time   NA 140 05/23/2022 1445   NA 140 09/18/2020 1038   K 4.1 05/23/2022 1445   CL 107 05/23/2022 1445   CO2 30 05/23/2022 1445   GLUCOSE 97 05/23/2022 1445   BUN 17 05/23/2022 1445   BUN 12 09/18/2020 1038   CREATININE 1.13 05/23/2022 1445   CREATININE 1.13 03/01/2017 0001   CALCIUM 9.5 05/23/2022 1445   PROT 6.1 05/23/2022 1445   PROT 6.7 09/18/2020 1038   ALBUMIN 3.9 05/23/2022 1445   ALBUMIN 4.3 09/18/2020 1038   AST 26 05/23/2022 1445   ALT 31 05/23/2022 1445   ALKPHOS 70 05/23/2022 1445   BILITOT 0.6 05/23/2022 1445   BILITOT 0.6 09/18/2020 1038   GFRNONAA 65 09/18/2020 1038   GFRAA 75 09/18/2020 1038   Lab Results  Component Value Date   CHOL 84 05/23/2022   HDL 34.50 (L) 05/23/2022   LDLCALC 39 05/23/2022   TRIG 55.0 05/23/2022   CHOLHDL 2 05/23/2022   Lab Results  Component Value Date   HGBA1C 5.6 05/23/2022   Lab Results  Component Value Date   VITAMINB12 702 05/23/2022   Lab Results  Component Value Date   TSH 1.54 05/23/2022    MRI Brain wo contrast 12/29/2021 1. No acute intracranial pathology.  No evidence of recent infarct. 2. Mild parenchymal volume loss and chronic white matter  microangiopathy   ASSESSMENT AND PLAN  76 y.o. year old male with atrial fibrillation status post ablation, hypertension, hyperlipidemia who is presenting for follow up for memory deficit, that he describes as trouble with recall. He would like to start medication to help him with his memory. Due to his bradycardia, I told him that Aricept might not be a good medication for him but we can try him on Rivastigmine 1.5 mg BID for one month and if tolerated, will increase it to 3 mg BID. He voices understanding.  When it comes to the lower extremity pain, he reports since starting Pregabalin, he has muscular pain in the bilateral calf with ambulation, I have advised him to hold the Pregabalin and see if pain resolved. If pain resolved, then to discontinue Pregabalin. He voices understanding. I will see him for follow up in February 2024 after completion of his neuropsychological testing.       1. Mild cognitive impairment   2. Vitamin B 12 deficiency   3. TIA (transient ischemic attack)      Patient Instructions  Start with Rivastigmine 1.5 mg BID for one month then increase to 3 mg BID  Hold Pregabalin to see if calf pain resolved, if resolved, then discontinue Pregabalin Follow up in February 2024 after completion of neuropsychiatry testing.   No orders of the defined types were placed in this encounter.   Meds ordered this encounter  Medications   rivastigmine (EXELON) 1.5 MG  capsule    Sig: Take 1 capsule (1.5 mg total) by mouth 2 (two) times daily.    Dispense:  60 capsule    Refill:  0    Return in about 7 months (around 01/16/2023).  I have spent a total of 30 minutes dedicated to this patient today, preparing to see patient, performing a medically appropriate examination and evaluation, ordering tests and/or medications and procedures, and counseling and educating the patient/family/caregiver; independently interpreting result and communicating results to the  family/patient/caregiver; and documenting clinical information in the electronic medical record.    Alric Ran, MD 06/15/2022, 5:26 PM  [facility] [address], [place], [place] [address] [phone]

## 2022-06-16 ENCOUNTER — Encounter: Payer: Self-pay | Admitting: Neurology

## 2022-06-21 ENCOUNTER — Other Ambulatory Visit: Payer: Self-pay | Admitting: *Deleted

## 2022-06-21 MED ORDER — RIVASTIGMINE TARTRATE 1.5 MG PO CAPS: 1.5000 mg | ORAL_CAPSULE | Freq: Two times a day (BID) | ORAL | 0 refills | Status: DC

## 2022-06-23 DIAGNOSIS — I480 Paroxysmal atrial fibrillation: Secondary | ICD-10-CM | POA: Diagnosis not present

## 2022-06-24 ENCOUNTER — Encounter: Payer: Self-pay | Admitting: Internal Medicine

## 2022-06-24 ENCOUNTER — Encounter: Payer: Self-pay | Admitting: Pulmonary Disease

## 2022-07-14 ENCOUNTER — Other Ambulatory Visit: Payer: Self-pay

## 2022-07-14 MED ORDER — RIVASTIGMINE TARTRATE 1.5 MG PO CAPS: 1.5000 mg | ORAL_CAPSULE | Freq: Two times a day (BID) | ORAL | 0 refills | Status: DC

## 2022-07-18 ENCOUNTER — Ambulatory Visit: Payer: Medicare HMO

## 2022-07-18 DIAGNOSIS — G4733 Obstructive sleep apnea (adult) (pediatric): Secondary | ICD-10-CM | POA: Diagnosis not present

## 2022-07-21 ENCOUNTER — Encounter: Payer: Self-pay | Admitting: Neurology

## 2022-07-21 ENCOUNTER — Other Ambulatory Visit: Payer: Self-pay | Admitting: *Deleted

## 2022-07-21 MED ORDER — RIVASTIGMINE TARTRATE 1.5 MG PO CAPS: 1.5000 mg | ORAL_CAPSULE | Freq: Two times a day (BID) | ORAL | 1 refills | Status: DC

## 2022-07-21 MED ORDER — RIVASTIGMINE TARTRATE 3 MG PO CAPS: 3.0000 mg | ORAL_CAPSULE | Freq: Two times a day (BID) | ORAL | 1 refills | Status: DC

## 2022-07-29 ENCOUNTER — Encounter: Payer: Self-pay | Admitting: Internal Medicine

## 2022-08-10 ENCOUNTER — Telehealth: Payer: Self-pay | Admitting: Pulmonary Disease

## 2022-08-10 DIAGNOSIS — G4733 Obstructive sleep apnea (adult) (pediatric): Secondary | ICD-10-CM

## 2022-08-10 NOTE — Telephone Encounter (Signed)
Call patient  Sleep study result  Date of study: 07/18/2022  Impression: Moderate obstructive sleep apnea Mild oxygen desaturations  Recommendation: DME referral  Recommend CPAP therapy for moderate obstructive sleep apnea  Auto titrating CPAP with pressure settings of 5-15 will be appropriate  Encourage weight loss measures  Follow-up in the office 4 to 6 weeks following initiation of treatment

## 2022-08-15 NOTE — Telephone Encounter (Signed)
Patient called back stating his DME is [facility].

## 2022-08-15 NOTE — Telephone Encounter (Signed)
I called the patient and he is going to call back with the medical supply company. Waiting on a call back for the company of preference for DME.

## 2022-08-15 NOTE — Telephone Encounter (Signed)
DME order for cpap has been sent to [facility] for pt. Called and spoke with pt letting him know this had been done and he verbalized understanding.nothing further needed.

## 2022-08-23 ENCOUNTER — Ambulatory Visit (INDEPENDENT_AMBULATORY_CARE_PROVIDER_SITE_OTHER): Payer: Medicare HMO | Admitting: Internal Medicine

## 2022-08-23 VITALS — BP 122/74 | HR 60 | Temp 97.9°F | Ht 78.0 in | Wt 214.0 lb

## 2022-08-23 DIAGNOSIS — M5441 Lumbago with sciatica, right side: Secondary | ICD-10-CM

## 2022-08-23 DIAGNOSIS — M5442 Lumbago with sciatica, left side: Secondary | ICD-10-CM | POA: Diagnosis not present

## 2022-08-23 DIAGNOSIS — N400 Enlarged prostate without lower urinary tract symptoms: Secondary | ICD-10-CM

## 2022-08-23 DIAGNOSIS — G8929 Other chronic pain: Secondary | ICD-10-CM | POA: Diagnosis not present

## 2022-08-23 DIAGNOSIS — I10 Essential (primary) hypertension: Secondary | ICD-10-CM | POA: Diagnosis not present

## 2022-08-23 MED ORDER — TAMSULOSIN HCL 0.4 MG PO CAPS: 0.4000 mg | ORAL_CAPSULE | Freq: Every day | ORAL | 3 refills | Status: DC

## 2022-08-23 NOTE — Patient Instructions (Signed)
Please take all new medication as prescribed  - the flomax for the prostate  Please remember to follow up yearly with Dr [facility]  Please continue all other medications as before, and refills have been done if requested.  Please have the pharmacy call with any other refills you may need.  Please continue your efforts at being more active, low cholesterol diet, and weight control.  You are otherwise up to date with prevention measures today.  Please keep your appointments with your specialists as you may have planned  You will be contacted regarding the referral for: MRI for the Lumbar spine, and Neurosurgury  We can hold on lab testing today

## 2022-08-23 NOTE — Progress Notes (Unsigned)
Patient ID: Dustin Ferries., male   DOB: 1946/10/22, 76 y.o.   MRN: [id number]        Chief Complaint: follow up chronic lbp with worsenig distal fight foot and toe weakness       HPI:  Dustin Berry. is a 76 y.o. male here with c/o chronic lbp for years and known related bilateral peripheral neuropathy, but seems worsening now with increased right > left with worsening numbness distal right leg and lack of power to the right foot and unable to bear wt or use the right great toe to push off with walking, so tends to scuff the right foot with less able to pick up to walk normally  Pt denies chest pain, increased sob or doe, wheezing, orthopnea, PND, increased LE swelling, palpitations, dizziness or syncope.   Pt denies polydipsia, polyuria.  Denies urinary symptoms such as dysuria, frequency, urgency, flank pain, hematuria or n/v, fever, chills but does have decreased urine flow and nocturia x 3 in the past month.          Wt Readings from Last 3 Encounters:  08/23/22 214 lb (97.1 kg)  06/15/22 221 lb (100.2 kg)  05/25/22 225 lb 9.6 oz (102.3 kg)   BP Readings from Last 3 Encounters:  08/23/22 122/74  06/15/22 110/79  05/25/22 114/86         Past Medical History:  Diagnosis Date   Abdominal pain 04/01/2013   ALLERGIC RHINITIS 05/12/2008   Arthritis    Atrial fibrillation ([place]) 03/15/2010   14 day event monitor - 1 episode of sinus bradycardia   Atrial fibrillation with RVR ([place]) 10/21/2012   Echo- EF 50-55%; mild concentric LVH; flow pattern suggestive of impaired LV relaxation; mild mitral valve prolapse, trace mitral regurgitation   B12 deficiency 06/15/2019   Back pain    receiving PT   Boil of buttock 06/24/11   BPH (benign prostatic hypertrophy) 09/05/2012   Cataract    Chest pain with exertion presumed to be tachycardia related along with SOB 04/01/2013   ESOPHAGEAL STRICTURE 03/24/2009   Heart murmur    Hematuria, possible 04/01/2013   HTN (hypertension) 12/04/2013    HYPERLIPIDEMIA 08/18/2007   Insomnia 06/08/2016   INSOMNIA-SLEEP DISORDER-UNSPEC 05/14/2009   Mitral valve prolapse 07/04/2011   MRSA (methicillin resistant staph aureus) culture positive 5+ years ago   Neuromuscular disorder ([place])    peripheral neuropathy   OSA (obstructive sleep apnea) 07/04/2011   sleep study - average AHI 2.5   Palpitations 03/06/2007   R/P MV - mild perfusion defect in basal inferoseptal, basal inferior, mid inferoseptal, and mid inferior regions, consistent w/ infarct/scar; no scintigraphic evidence of inducible myocardial ischemia; prior non transmural infarct cannot be completely excluded; EF 48%; no significant change from previous study   PERIPHERAL NEUROPATHY 05/12/2008   Personal history of colonic polyps 05/12/2008   Preventative health care 06/24/2011   Sleep apnea    pt denies   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   Past Surgical History:  Procedure Laterality Date   ATRIAL FIBRILLATION ABLATION     CARDIOVERSION N/A 04/03/2013   Procedure: CARDIOVERSION;  Surgeon: Pixie Casino, MD;  Location: [facility];  Service: Cardiovascular;  Laterality: N/A;   COLONOSCOPY  2018   HAND SURGERY     Thumb joint repair   POLYPECTOMY     TEE WITHOUT CARDIOVERSION N/A 04/03/2013   Procedure: TRANSESOPHAGEAL ECHOCARDIOGRAM (TEE);  Surgeon: Pixie Casino, MD;  Location: [facility];  Service: Cardiovascular;  Laterality: N/A;   TONSILLECTOMY AND ADENOIDECTOMY     UPPER GASTROINTESTINAL ENDOSCOPY     dilation    reports that he has never smoked. He has never used smokeless tobacco. He reports current alcohol use of about 2.0 standard drinks of alcohol per week. He reports that he does not use drugs. family history includes Breast cancer in his maternal grandmother; Dementia in his mother; Heart disease (age of onset: 20) in his father; Melanoma in his sister; Stroke in his maternal grandfather. Allergies  Allergen Reactions   Penicillin G Rash   Quinolones Other (See  Comments)    Other reaction(s): Other (See Comments) Fluroquinolone antibiotics should be avoided in patients with history of aortic aneurysm/dissection   Amoxil [Amoxicillin] Rash   Current Outpatient Medications on File Prior to Visit  Medication Sig Dispense Refill   aspirin 81 MG EC tablet Take 1 tablet (81 mg total) by mouth daily. Swallow whole. 30 tablet 12   atorvastatin (LIPITOR) 80 MG tablet Take 1 tablet (80 mg total) by mouth daily. 90 tablet 3   b complex vitamins capsule Take 1 capsule by mouth daily.     lisinopril (ZESTRIL) 10 MG tablet TAKE 1 TABLET BY MOUTH EVERY DAY 90 tablet 3   meloxicam (MOBIC) 15 MG tablet Take 15 mg by mouth as needed for pain. TAKE 0.5-1 tablet  AS NEEDED     metoprolol succinate (TOPROL-XL) 25 MG 24 hr tablet TAKE 37.'5MG'$  (1.5 TABLETS) DAILY FOR TWO WEEKS, THEN INCREASE TO '50MG'$  (2 TABLETS) DAILY. 180 tablet 3   metoprolol succinate (TOPROL-XL) 50 MG 24 hr tablet Take 1 tablet (50 mg total) by mouth daily. Take 37.'5mg'$  (1.5 tablets) daily for two weeks, then increase to '50mg'$  (2 tablets) daily. 90 tablet 3   rivastigmine (EXELON) 3 MG capsule Take 1 capsule (3 mg total) by mouth 2 (two) times daily. 180 capsule 1   VITAMIN D PO Take 1 tablet by mouth daily in the afternoon.     zolpidem (AMBIEN) 5 MG tablet TAKE 1 TABLET BY MOUTH AT BEDTIME AS NEEDED FOR SLEEP. 30 tablet 0   pregabalin (LYRICA) 75 MG capsule Take 1 capsule (75 mg total) by mouth at bedtime. 30 capsule 3   No current facility-administered medications on file prior to visit.        ROS:  All others reviewed and negative.  Objective        PE:  BP 122/74 (BP Location: Left Arm, Patient Position: Sitting, Cuff Size: Large)   Pulse 60   Temp 97.9 F (36.6 C) (Oral)   Ht '6\' 6"'$  (1.981 m)   Wt 214 lb (97.1 kg)   SpO2 95%   BMI 24.73 kg/m                 Constitutional: Pt appears in NAD               HENT: Head: NCAT.                Right Ear: External ear normal.                  Left Ear: External ear normal.                Eyes: . Pupils are equal, round, and reactive to light. Conjunctivae and EOM are normal               Nose: without d/c or deformity  Neck: Neck supple. Gross normal ROM               Cardiovascular: Normal rate and regular rhythm.                 Pulmonary/Chest: Effort normal and breath sounds without rales or wheezing.                Abd:  Soft, NT, ND, + BS, no organomegaly; spine diffusely tender at the lest lumbar               Neurological: Pt is alert. At baseline orientation, motor with 4/5 distal RLE weakness, unable to move right great toe with ambulation               Skin: Skin is warm. No rashes, no other new lesions, LE edema - none               Psychiatric: Pt behavior is normal without agitation   Micro: none  Cardiac tracings I have personally interpreted today:  none  Pertinent Radiological findings (summarize): none   Lab Results  Component Value Date   WBC 3.8 (L) 05/23/2022   HGB 14.2 05/23/2022   HCT 42.2 05/23/2022   PLT 196.0 05/23/2022   GLUCOSE 97 05/23/2022   CHOL 84 05/23/2022   TRIG 55.0 05/23/2022   HDL 34.50 (L) 05/23/2022   LDLCALC 39 05/23/2022   ALT 31 05/23/2022   AST 26 05/23/2022   NA 140 05/23/2022   K 4.1 05/23/2022   CL 107 05/23/2022   CREATININE 1.13 05/23/2022   BUN 17 05/23/2022   CO2 30 05/23/2022   TSH 1.54 05/23/2022   PSA 1.31 05/23/2022   INR 1.19 04/01/2013   HGBA1C 5.6 05/23/2022   Assessment/Plan:  Wynonia Sours Jadrian Bulman. is a 76 y.o. White or Caucasian [1] male with  has a past medical history of Abdominal pain (04/01/2013), ALLERGIC RHINITIS (05/12/2008), Arthritis, Atrial fibrillation ([place]) (03/15/2010), Atrial fibrillation with RVR ([place]) (10/21/2012), B12 deficiency (06/15/2019), Back pain, Boil of buttock (06/24/11), BPH (benign prostatic hypertrophy) (09/05/2012), Cataract, Chest pain with exertion presumed to be tachycardia related along with SOB (04/01/2013),  ESOPHAGEAL STRICTURE (03/24/2009), Heart murmur, Hematuria, possible (04/01/2013), HTN (hypertension) (12/04/2013), HYPERLIPIDEMIA (08/18/2007), Insomnia (06/08/2016), INSOMNIA-SLEEP DISORDER-UNSPEC (05/14/2009), Mitral valve prolapse (07/04/2011), MRSA (methicillin resistant staph aureus) culture positive (5+ years ago), Neuromuscular disorder ([place]), OSA (obstructive sleep apnea) (07/04/2011), Palpitations (03/06/2007), PERIPHERAL NEUROPATHY (05/12/2008), Personal history of colonic polyps (05/12/2008), Preventative health care (06/24/2011), Sleep apnea, and 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  Low back pain Now 3 wks worsening pain mostly right lower back with worsening distal RLE weakness, has not had MRI or surgical review recently, now for LS spine MRI, and refer NS  Benign prostatic hyperplasia With worsening obstructive symptoms and nocturia - for flomax 1 qd and refer urology  HTN (hypertension) BP Readings from Last 3 Encounters:  08/23/22 122/74  06/15/22 110/79  05/25/22 114/86   Stable, pt to continue medical treatment lisinopril 10 mg qd, and toprol xl 50 mg   Followup: Return in about 3 months (around 11/21/2022).  Cathlean Cower, MD 08/24/2022 9:32 PM [facility]

## 2022-08-24 ENCOUNTER — Encounter: Payer: Self-pay | Admitting: Internal Medicine

## 2022-08-24 NOTE — Assessment & Plan Note (Signed)
BP Readings from Last 3 Encounters:  08/23/22 122/74  06/15/22 110/79  05/25/22 114/86   Stable, pt to continue medical treatment lisinopril 10 mg qd, and toprol xl 50 mg

## 2022-08-24 NOTE — Assessment & Plan Note (Signed)
Now 3 wks worsening pain mostly right lower back with worsening distal RLE weakness, has not had MRI or surgical review recently, now for LS spine MRI, and refer NS

## 2022-08-24 NOTE — Assessment & Plan Note (Signed)
With worsening obstructive symptoms and nocturia - for flomax 1 qd and refer urology

## 2022-08-29 DIAGNOSIS — G4733 Obstructive sleep apnea (adult) (pediatric): Secondary | ICD-10-CM | POA: Diagnosis not present

## 2022-09-05 ENCOUNTER — Other Ambulatory Visit: Payer: Medicare HMO

## 2022-09-05 ENCOUNTER — Other Ambulatory Visit (INDEPENDENT_AMBULATORY_CARE_PROVIDER_SITE_OTHER): Payer: Self-pay

## 2022-09-05 DIAGNOSIS — G3184 Mild cognitive impairment, so stated: Secondary | ICD-10-CM | POA: Diagnosis not present

## 2022-09-05 DIAGNOSIS — Z0289 Encounter for other administrative examinations: Secondary | ICD-10-CM

## 2022-09-06 LAB — VITAMIN B12: Vitamin B-12: 823 pg/mL (ref 232–1245)

## 2022-09-14 ENCOUNTER — Other Ambulatory Visit: Payer: Self-pay | Admitting: Internal Medicine

## 2022-09-15 NOTE — Telephone Encounter (Signed)
Please refill as per office routine med refill policy (all routine meds to be refilled for 3 mo or monthly (per pt preference) up to one year from last visit, then month to month grace period for 3 mo, then further med refills will have to be denied) ? ?

## 2022-09-19 DIAGNOSIS — Z961 Presence of intraocular lens: Secondary | ICD-10-CM | POA: Diagnosis not present

## 2022-09-19 DIAGNOSIS — H26493 Other secondary cataract, bilateral: Secondary | ICD-10-CM | POA: Diagnosis not present

## 2022-09-19 DIAGNOSIS — H524 Presbyopia: Secondary | ICD-10-CM | POA: Diagnosis not present

## 2022-09-19 DIAGNOSIS — H35373 Puckering of macula, bilateral: Secondary | ICD-10-CM | POA: Diagnosis not present

## 2022-09-19 DIAGNOSIS — H5212 Myopia, left eye: Secondary | ICD-10-CM | POA: Diagnosis not present

## 2022-09-19 DIAGNOSIS — H52222 Regular astigmatism, left eye: Secondary | ICD-10-CM | POA: Diagnosis not present

## 2022-09-28 DIAGNOSIS — G4733 Obstructive sleep apnea (adult) (pediatric): Secondary | ICD-10-CM | POA: Diagnosis not present

## 2022-10-19 ENCOUNTER — Encounter: Payer: Self-pay | Admitting: Internal Medicine

## 2022-10-21 NOTE — Telephone Encounter (Signed)
The shot is fine, but may not be needed if you are not exposed at all to young persons - say under 16 to.  If there is some exposure, it would be helpful just to help reduce the risk of RSV pneumonia which is uncommon but can be fatal.

## 2022-10-21 NOTE — Telephone Encounter (Signed)
Please advise for RSV recommendation, I'd be more than happy to inform him.

## 2022-10-29 DIAGNOSIS — G4733 Obstructive sleep apnea (adult) (pediatric): Secondary | ICD-10-CM | POA: Diagnosis not present

## 2022-11-21 ENCOUNTER — Encounter: Payer: Self-pay | Admitting: Internal Medicine

## 2022-11-21 ENCOUNTER — Ambulatory Visit (INDEPENDENT_AMBULATORY_CARE_PROVIDER_SITE_OTHER): Payer: Medicare HMO | Admitting: Internal Medicine

## 2022-11-21 VITALS — BP 122/78 | HR 60 | Temp 98.1°F | Ht 78.0 in | Wt 224.0 lb

## 2022-11-21 DIAGNOSIS — M9903 Segmental and somatic dysfunction of lumbar region: Secondary | ICD-10-CM | POA: Diagnosis not present

## 2022-11-21 DIAGNOSIS — E559 Vitamin D deficiency, unspecified: Secondary | ICD-10-CM | POA: Diagnosis not present

## 2022-11-21 DIAGNOSIS — R739 Hyperglycemia, unspecified: Secondary | ICD-10-CM

## 2022-11-21 DIAGNOSIS — M5451 Vertebrogenic low back pain: Secondary | ICD-10-CM | POA: Diagnosis not present

## 2022-11-21 DIAGNOSIS — M5431 Sciatica, right side: Secondary | ICD-10-CM | POA: Diagnosis not present

## 2022-11-21 DIAGNOSIS — I700 Atherosclerosis of aorta: Secondary | ICD-10-CM

## 2022-11-21 DIAGNOSIS — M5432 Sciatica, left side: Secondary | ICD-10-CM | POA: Diagnosis not present

## 2022-11-21 DIAGNOSIS — I7121 Aneurysm of the ascending aorta, without rupture: Secondary | ICD-10-CM | POA: Diagnosis not present

## 2022-11-21 DIAGNOSIS — E7800 Pure hypercholesterolemia, unspecified: Secondary | ICD-10-CM | POA: Diagnosis not present

## 2022-11-21 DIAGNOSIS — I10 Essential (primary) hypertension: Secondary | ICD-10-CM | POA: Diagnosis not present

## 2022-11-21 LAB — CBC WITH DIFFERENTIAL/PLATELET
Basophils Absolute: 0 10*3/uL (ref 0.0–0.1)
Basophils Relative: 0.5 % (ref 0.0–3.0)
Eosinophils Absolute: 0.3 10*3/uL (ref 0.0–0.7)
Eosinophils Relative: 5.6 % — ABNORMAL HIGH (ref 0.0–5.0)
HCT: 45.6 % (ref 39.0–52.0)
Hemoglobin: 15.6 g/dL (ref 13.0–17.0)
Lymphocytes Relative: 30.4 % (ref 12.0–46.0)
Lymphs Abs: 1.6 10*3/uL (ref 0.7–4.0)
MCHC: 34.1 g/dL (ref 30.0–36.0)
MCV: 91.2 fl (ref 78.0–100.0)
Monocytes Absolute: 0.7 10*3/uL (ref 0.1–1.0)
Monocytes Relative: 12.6 % — ABNORMAL HIGH (ref 3.0–12.0)
Neutro Abs: 2.8 10*3/uL (ref 1.4–7.7)
Neutrophils Relative %: 50.9 % (ref 43.0–77.0)
Platelets: 213 10*3/uL (ref 150.0–400.0)
RBC: 5 Mil/uL (ref 4.22–5.81)
RDW: 13.9 % (ref 11.5–15.5)
WBC: 5.4 10*3/uL (ref 4.0–10.5)

## 2022-11-21 LAB — HEMOGLOBIN A1C: Hgb A1c MFr Bld: 5.7 % (ref 4.6–6.5)

## 2022-11-21 LAB — BASIC METABOLIC PANEL
BUN: 15 mg/dL (ref 6–23)
CO2: 27 mEq/L (ref 19–32)
Calcium: 9.5 mg/dL (ref 8.4–10.5)
Chloride: 107 mEq/L (ref 96–112)
Creatinine, Ser: 1.11 mg/dL (ref 0.40–1.50)
GFR: 64.95 mL/min (ref >60.00)
Glucose, Bld: 102 mg/dL — ABNORMAL HIGH (ref 70–99)
Potassium: 4 mEq/L (ref 3.5–5.1)
Sodium: 142 mEq/L (ref 135–145)

## 2022-11-21 LAB — LIPID PANEL
Cholesterol: 102 mg/dL (ref 0–200)
HDL: 49.1 mg/dL (ref >39.00)
LDL Cholesterol: 43 mg/dL (ref 0–99)
NonHDL: 52.44
Total CHOL/HDL Ratio: 2
Triglycerides: 48 mg/dL (ref 0.0–149.0)
VLDL: 9.6 mg/dL (ref 0.0–40.0)

## 2022-11-21 LAB — HEPATIC FUNCTION PANEL
ALT: 31 U/L (ref 0–53)
AST: 27 U/L (ref 0–37)
Albumin: 4.2 g/dL (ref 3.5–5.2)
Alkaline Phosphatase: 64 U/L (ref 39–117)
Bilirubin, Direct: 0.2 mg/dL (ref 0.0–0.3)
Total Bilirubin: 0.8 mg/dL (ref 0.2–1.2)
Total Protein: 6.8 g/dL (ref 6.0–8.3)

## 2022-11-21 LAB — URINALYSIS, ROUTINE W REFLEX MICROSCOPIC
Bilirubin Urine: NEGATIVE
Hgb urine dipstick: NEGATIVE
Ketones, ur: NEGATIVE
Leukocytes,Ua: NEGATIVE
Nitrite: NEGATIVE
RBC / HPF: NONE SEEN (ref 0–?)
Specific Gravity, Urine: 1.02 (ref 1.000–1.030)
Total Protein, Urine: NEGATIVE
Urine Glucose: NEGATIVE
Urobilinogen, UA: 0.2 (ref 0.0–1.0)
pH: 6.5 (ref 5.0–8.0)

## 2022-11-21 LAB — TSH: TSH: 2.68 u[IU]/mL (ref 0.35–5.50)

## 2022-11-21 LAB — VITAMIN D 25 HYDROXY (VIT D DEFICIENCY, FRACTURES): VITD: 64.08 ng/mL (ref 30.00–100.00)

## 2022-11-21 NOTE — Patient Instructions (Signed)
Please continue all other medications as before, and refills have been done if requested.  Please have the pharmacy call with any other refills you may need.  Please continue your efforts at being more active, low cholesterol diet, and weight control  Please keep your appointments with your specialists as you may have planned  Please let us know if you would want the Cardiac CT score testing  Please go to the LAB at the blood drawing area for the tests to be done  You will be contacted by phone if any changes need to be made immediately.  Otherwise, you will receive a letter about your results with an explanation, but please check with MyChart first.  Please remember to sign up for MyChart if you have not done so, as this will be important to you in the future with finding out test results, communicating by private email, and scheduling acute appointments online when needed.  Please make an Appointment to return in 6 months, or sooner if needed

## 2022-11-21 NOTE — Progress Notes (Signed)
Patient ID: Dustin Berry., male   DOB: 27-Mar-1946, 76 y.o.   MRN: [id number]        Chief Complaint: follow up HTN, HLD and hyperglycemia, chest pain, low vit d, aortic atherosclerosis       HPI:  Dustin Berry. is a 76 y.o. male here overall doing ok;  Pt denies chest pain, increased sob or doe, wheezing, orthopnea, PND, increased LE swelling, palpitations, dizziness or syncope, except has had stabbing severe left costal margin pain several times recently, plueritic.  Has CT angio chest planned for apr 2024 and declines cxr.  Most recent CT angio chest indicates scattered coronary calcificaitons.  Pt declines Cardiac CT score for now.   Pt denies polydipsia, polyuria, or new focal neuro s/s.    Pt denies fever, wt loss, night sweats, loss of appetite, or other constitutional symptoms Recently started on exelon per psychiatry, due for formal mental status exam next month.  Back to using CPAP nightly well.  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 Wt Readings from Last 3 Encounters:  11/21/22 224 lb (101.6 kg)  08/23/22 214 lb (97.1 kg)  06/15/22 221 lb (100.2 kg)   BP Readings from Last 3 Encounters:  11/21/22 122/78  08/23/22 122/74  06/15/22 110/79         Past Medical History:  Diagnosis Date   Abdominal pain 04/01/2013   ALLERGIC RHINITIS 05/12/2008   Arthritis    Atrial fibrillation ([place]) 03/15/2010   14 day event monitor - 1 episode of sinus bradycardia   Atrial fibrillation with RVR ([place]) 10/21/2012   Echo- EF 50-55%; mild concentric LVH; flow pattern suggestive of impaired LV relaxation; mild mitral valve prolapse, trace mitral regurgitation   B12 deficiency 06/15/2019   Back pain    receiving PT   Boil of buttock 06/24/11   BPH (benign prostatic hypertrophy) 09/05/2012   Cataract    Chest pain with exertion presumed to be tachycardia related along with SOB 04/01/2013   ESOPHAGEAL STRICTURE 03/24/2009   Heart murmur    Hematuria,  possible 04/01/2013   HTN (hypertension) 12/04/2013   HYPERLIPIDEMIA 08/18/2007   Insomnia 06/08/2016   INSOMNIA-SLEEP DISORDER-UNSPEC 05/14/2009   Mitral valve prolapse 07/04/2011   MRSA (methicillin resistant staph aureus) culture positive 5+ years ago   Neuromuscular disorder ([place])    peripheral neuropathy   OSA (obstructive sleep apnea) 07/04/2011   sleep study - average AHI 2.5   Palpitations 03/06/2007   R/P MV - mild perfusion defect in basal inferoseptal, basal inferior, mid inferoseptal, and mid inferior regions, consistent w/ infarct/scar; no scintigraphic evidence of inducible myocardial ischemia; prior non transmural infarct cannot be completely excluded; EF 48%; no significant change from previous study   PERIPHERAL NEUROPATHY 05/12/2008   Personal history of colonic polyps 05/12/2008   Preventative health care 06/24/2011   Sleep apnea    pt denies   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   Past Surgical History:  Procedure Laterality Date   ATRIAL FIBRILLATION ABLATION     CARDIOVERSION N/A 04/03/2013   Procedure: CARDIOVERSION;  Surgeon: Pixie Casino, MD;  Location: [facility];  Service: Cardiovascular;  Laterality: N/A;   COLONOSCOPY  2018   HAND SURGERY     Thumb joint repair   POLYPECTOMY     TEE WITHOUT CARDIOVERSION N/A 04/03/2013   Procedure: TRANSESOPHAGEAL ECHOCARDIOGRAM (TEE);  Surgeon: Pixie Casino, MD;  Location: [facility];  Service: Cardiovascular;  Laterality: N/A;   TONSILLECTOMY AND ADENOIDECTOMY     UPPER GASTROINTESTINAL ENDOSCOPY     dilation    reports that he has never smoked. He has never used smokeless tobacco. He reports current alcohol use of about 2.0 standard drinks of alcohol per week. He reports that he does not use drugs. family history includes Breast cancer in his maternal grandmother; Dementia in his mother; Heart disease (age of onset: 46) in his father; Melanoma in his sister; Stroke in his maternal grandfather. Allergies  Allergen  Reactions   Penicillin G Rash   Quinolones Other (See Comments)    Other reaction(s): Other (See Comments) Fluroquinolone antibiotics should be avoided in patients with history of aortic aneurysm/dissection   Amoxil [Amoxicillin] Rash   Current Outpatient Medications on File Prior to Visit  Medication Sig Dispense Refill   aspirin 81 MG EC tablet Take 1 tablet (81 mg total) by mouth daily. Swallow whole. 30 tablet 12   atorvastatin (LIPITOR) 80 MG tablet Take 1 tablet (80 mg total) by mouth daily. 90 tablet 3   b complex vitamins capsule Take 1 capsule by mouth daily.     lisinopril (ZESTRIL) 10 MG tablet TAKE 1 TABLET BY MOUTH EVERY DAY 90 tablet 3   meloxicam (MOBIC) 15 MG tablet Take 15 mg by mouth as needed for pain. TAKE 0.5-1 tablet  AS NEEDED     metoprolol succinate (TOPROL-XL) 25 MG 24 hr tablet TAKE 37.'5MG'$  (1.5 TABLETS) DAILY FOR TWO WEEKS, THEN INCREASE TO '50MG'$  (2 TABLETS) DAILY. 180 tablet 3   rivastigmine (EXELON) 3 MG capsule Take 1 capsule (3 mg total) by mouth 2 (two) times daily. 180 capsule 1   tamsulosin (FLOMAX) 0.4 MG CAPS capsule Take 1 capsule (0.4 mg total) by mouth daily. 90 capsule 3   VITAMIN D PO Take 1 tablet by mouth daily in the afternoon.     zolpidem (AMBIEN) 5 MG tablet TAKE 1 TABLET BY MOUTH AT BEDTIME AS NEEDED FOR SLEEP. 30 tablet 0   No current facility-administered medications on file prior to visit.        ROS:  All others reviewed and negative.  Objective        PE:  BP 122/78 (BP Location: Right Arm, Patient Position: Sitting, Cuff Size: Large)   Pulse 60   Temp 98.1 F (36.7 C) (Oral)   Ht '6\' 6"'$  (1.981 m)   Wt 224 lb (101.6 kg)   SpO2 95%   BMI 25.89 kg/m                 Constitutional: Pt appears in NAD               HENT: Head: NCAT.                Right Ear: External ear normal.                 Left Ear: External ear normal.                Eyes: . Pupils are equal, round, and reactive to light. Conjunctivae and EOM are normal                Nose: without d/c or deformity               Neck: Neck supple. Gross normal ROM               Cardiovascular: Normal rate and regular rhythm.  Pulmonary/Chest: Effort normal and breath sounds without rales or wheezing.                Abd:  Soft, NT, ND, + BS, no organomegaly               Neurological: Pt is alert. At baseline orientation, motor grossly intact               Skin: Skin is warm. No rashes, no other new lesions, LE edema - none               Psychiatric: Pt behavior is normal without agitation   Micro: none  Cardiac tracings I have personally interpreted today:  none  Pertinent Radiological findings (summarize): none   Lab Results  Component Value Date   WBC 3.8 (L) 05/23/2022   HGB 14.2 05/23/2022   HCT 42.2 05/23/2022   PLT 196.0 05/23/2022   GLUCOSE 97 05/23/2022   CHOL 84 05/23/2022   TRIG 55.0 05/23/2022   HDL 34.50 (L) 05/23/2022   LDLCALC 39 05/23/2022   ALT 31 05/23/2022   AST 26 05/23/2022   NA 140 05/23/2022   K 4.1 05/23/2022   CL 107 05/23/2022   CREATININE 1.13 05/23/2022   BUN 17 05/23/2022   CO2 30 05/23/2022   TSH 1.54 05/23/2022   PSA 1.31 05/23/2022   INR 1.19 04/01/2013   HGBA1C 5.6 05/23/2022   Assessment/Plan:  Wynonia Sours [place]. is a 76 y.o. White or Caucasian [1] male with  has a past medical history of Abdominal pain (04/01/2013), ALLERGIC RHINITIS (05/12/2008), Arthritis, Atrial fibrillation ([place]) (03/15/2010), Atrial fibrillation with RVR ([place]) (10/21/2012), B12 deficiency (06/15/2019), Back pain, Boil of buttock (06/24/11), BPH (benign prostatic hypertrophy) (09/05/2012), Cataract, Chest pain with exertion presumed to be tachycardia related along with SOB (04/01/2013), ESOPHAGEAL STRICTURE (03/24/2009), Heart murmur, Hematuria, possible (04/01/2013), HTN (hypertension) (12/04/2013), HYPERLIPIDEMIA (08/18/2007), Insomnia (06/08/2016), INSOMNIA-SLEEP DISORDER-UNSPEC (05/14/2009), Mitral valve prolapse (07/04/2011), MRSA  (methicillin resistant staph aureus) culture positive (5+ years ago), Neuromuscular disorder ([place]), OSA (obstructive sleep apnea) (07/04/2011), Palpitations (03/06/2007), PERIPHERAL NEUROPATHY (05/12/2008), Personal history of colonic polyps (05/12/2008), Preventative health care (06/24/2011), Sleep apnea, and 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  Aortic atherosclerosis ([facility]) Pt to continue statin lipitor 80 mg, and asa 81 mg qd, diet, excercise  Ascending aortic aneurysm ([facility]) Has planned f/u CT angio chest for Apr 2024 at [facility] (hyperlipidemia) Lab Results  Component Value Date   LDLCALC 39 05/23/2022   Stable, pt to continue current statin lipiitor 80 mg qd   HTN (hypertension) BP Readings from Last 3 Encounters:  11/21/22 122/78  08/23/22 122/74  06/15/22 110/79   Stable, pt to continue medical treatment lsinopril 10 qd, toprol xl 50 bid   Vitamin D deficiency Last vitamin D Lab Results  Component Value Date   VD25OH 108.31 ([place]) 05/23/2022   Overcontrolled, now taking half dose,, cont oral replacement and f/u lab  Followup: Return in about 6 months (around 05/23/2023).  Cathlean Cower, MD 11/21/2022 10:28 AM [facility]

## 2022-11-21 NOTE — Assessment & Plan Note (Signed)
Lab Results  Component Value Date   LDLCALC 39 05/23/2022   Stable, pt to continue current statin lipiitor 80 mg qd

## 2022-11-21 NOTE — Assessment & Plan Note (Addendum)
Last vitamin D Lab Results  Component Value Date   VD25OH 108.31 ([place]) 05/23/2022   Overcontrolled, now taking half dose,, cont oral replacement and f/u lab

## 2022-11-21 NOTE — Assessment & Plan Note (Signed)
BP Readings from Last 3 Encounters:  11/21/22 122/78  08/23/22 122/74  06/15/22 110/79   Stable, pt to continue medical treatment lsinopril 10 qd, toprol xl 50 bid

## 2022-11-21 NOTE — Assessment & Plan Note (Signed)
Pt to continue statin lipitor 80 mg, and asa 81 mg qd, diet, excercise

## 2022-11-21 NOTE — Assessment & Plan Note (Signed)
Has planned f/u CT angio chest for Apr 2024 at [facility]

## 2022-11-23 DIAGNOSIS — M9903 Segmental and somatic dysfunction of lumbar region: Secondary | ICD-10-CM | POA: Diagnosis not present

## 2022-11-23 DIAGNOSIS — M5431 Sciatica, right side: Secondary | ICD-10-CM | POA: Diagnosis not present

## 2022-11-23 DIAGNOSIS — M5451 Vertebrogenic low back pain: Secondary | ICD-10-CM | POA: Diagnosis not present

## 2022-11-23 DIAGNOSIS — M5432 Sciatica, left side: Secondary | ICD-10-CM | POA: Diagnosis not present

## 2022-11-25 DIAGNOSIS — M5432 Sciatica, left side: Secondary | ICD-10-CM | POA: Diagnosis not present

## 2022-11-25 DIAGNOSIS — M9903 Segmental and somatic dysfunction of lumbar region: Secondary | ICD-10-CM | POA: Diagnosis not present

## 2022-11-25 DIAGNOSIS — M5451 Vertebrogenic low back pain: Secondary | ICD-10-CM | POA: Diagnosis not present

## 2022-11-25 DIAGNOSIS — M5431 Sciatica, right side: Secondary | ICD-10-CM | POA: Diagnosis not present

## 2022-11-28 DIAGNOSIS — G4733 Obstructive sleep apnea (adult) (pediatric): Secondary | ICD-10-CM | POA: Diagnosis not present

## 2022-11-30 DIAGNOSIS — M5431 Sciatica, right side: Secondary | ICD-10-CM | POA: Diagnosis not present

## 2022-11-30 DIAGNOSIS — M5451 Vertebrogenic low back pain: Secondary | ICD-10-CM | POA: Diagnosis not present

## 2022-11-30 DIAGNOSIS — M5432 Sciatica, left side: Secondary | ICD-10-CM | POA: Diagnosis not present

## 2022-11-30 DIAGNOSIS — M9903 Segmental and somatic dysfunction of lumbar region: Secondary | ICD-10-CM | POA: Diagnosis not present

## 2022-12-01 DIAGNOSIS — M5432 Sciatica, left side: Secondary | ICD-10-CM | POA: Diagnosis not present

## 2022-12-01 DIAGNOSIS — M9903 Segmental and somatic dysfunction of lumbar region: Secondary | ICD-10-CM | POA: Diagnosis not present

## 2022-12-01 DIAGNOSIS — M5431 Sciatica, right side: Secondary | ICD-10-CM | POA: Diagnosis not present

## 2022-12-01 DIAGNOSIS — M5451 Vertebrogenic low back pain: Secondary | ICD-10-CM | POA: Diagnosis not present

## 2022-12-06 DIAGNOSIS — M5431 Sciatica, right side: Secondary | ICD-10-CM | POA: Diagnosis not present

## 2022-12-06 DIAGNOSIS — M5451 Vertebrogenic low back pain: Secondary | ICD-10-CM | POA: Diagnosis not present

## 2022-12-06 DIAGNOSIS — M5432 Sciatica, left side: Secondary | ICD-10-CM | POA: Diagnosis not present

## 2022-12-06 DIAGNOSIS — M9903 Segmental and somatic dysfunction of lumbar region: Secondary | ICD-10-CM | POA: Diagnosis not present

## 2022-12-09 ENCOUNTER — Ambulatory Visit: Payer: Medicare HMO

## 2022-12-09 ENCOUNTER — Ambulatory Visit: Payer: Medicare HMO | Admitting: Psychology

## 2022-12-09 ENCOUNTER — Encounter: Payer: Self-pay | Admitting: Psychology

## 2022-12-09 DIAGNOSIS — I6781 Acute cerebrovascular insufficiency: Secondary | ICD-10-CM | POA: Diagnosis not present

## 2022-12-09 DIAGNOSIS — G3184 Mild cognitive impairment, so stated: Secondary | ICD-10-CM | POA: Diagnosis not present

## 2022-12-09 DIAGNOSIS — R4189 Other symptoms and signs involving cognitive functions and awareness: Secondary | ICD-10-CM

## 2022-12-09 HISTORY — DX: Mild cognitive impairment of uncertain or unknown etiology: G31.84

## 2022-12-09 NOTE — Progress Notes (Signed)
NEUROPSYCHOLOGICAL EVALUATION [place]. [facility] Department of Neurology  Date of Evaluation: December 09, 2022  Reason for Referral:   Dustin Ratz. is a 77 y.o. right-handed Caucasian male referred by  Alric Ran, M.D. , to characterize his current cognitive functioning and assist with diagnostic clarity and treatment planning in the context of subjective cognitive decline.   Assessment and Plan:   Clinical Impression(s): Dustin Berry pattern of performance is suggestive of performance variability across both encoding (i.e., learning) and delayed retrieval aspects of verbal and visual memory. Retrieval aspects did exhibit somewhat greater dysfunction relative to encoding. Variability was also exhibited across memory recognition trials. Outside of memory, performances were largely age-appropriate across other assessed domains. This includes processing speed, attention/concentration, executive functioning, receptive and expressive language, and visuospatial abilities. Functionally, Dustin Berry did acknowledge trouble remembering to pay bills and some trouble with work performance due to memory dysfunction. At the present time, I feel he best meets diagnostic criteria for a Mild Neurocognitive Disorder ("mild cognitive impairment").  The etiology for ongoing dysfunction is unclear as overall testing patterns are nonspecific. Dustin Berry did exhibit weakness surrounding memory. Retention rates across memory tasks ranged from 14% to 75%, suggesting some concern for quick forgetting. However, despite poor performance across a list-based recognition trial, story and figure-based recognition trials were strong. Overall, there is not strong evidence to suggest a profound storage impairment, nor current testing patterns which would align with a neurodegenerative illness such as Alzheimer's disease. However, given some weakness, continued monitoring will be important as the extremely  early stages of this illness cannot be fully ruled out.   He does not exhibit behavioral or cognitive characteristics of Lewy body disease, frontotemporal lobar degeneration, or another more rare parkinsonian presentation. While memory variability can be impacted by cerebrovascular disease, his recent MRI suggested only mild changes which may in fact be age-appropriate.  Recommendations: A repeat neuropsychological evaluation in 12-18 months (or sooner if functional decline is noted) is recommended to assess the trajectory of future cognitive decline should it occur. This will also aid in future efforts towards improved diagnostic clarity.  Dustin Berry has already been prescribed a medication aimed to address memory loss and cognitive decline (i.e., rivastigmine/Exelon). He is encouraged to continue taking this medication as prescribed. It is important to highlight that this medication has been shown to slow functional decline in some individuals. There is no current treatment which can stop or reverse cognitive decline when caused by a neurodegenerative illness.   Performance across neurocognitive testing is not a strong predictor of an individual's safety operating a motor vehicle. Should his family wish to pursue a formalized driving evaluation, they could reach out to the following agencies: [employer] in [place]: [phone] Driver Rehabilitative Services: [facility]: [place]: [phone] or [phone]  Should there be a progression of current deficits over time, he is unlikely to regain any independent living skills lost. Therefore, it is recommended that he remain as involved as possible in all aspects of household chores, finances, and medication management, with supervision to ensure adequate performance. He will likely benefit from the establishment and maintenance of a routine in order to maximize his functional abilities over  time.  It will be important for Dustin Berry to have another person with him when in situations where he may need to process information, weigh the pros and cons of different options, and make decisions, in order to ensure that he  fully understands and recalls all information to be considered.  Dustin Berry is encouraged to attend to lifestyle factors for brain health (e.g., regular physical exercise, good nutrition habits, regular participation in cognitively-stimulating activities, and general stress management techniques), which are likely to have benefits for both emotional adjustment and cognition. Optimal control of vascular risk factors (including safe cardiovascular exercise and adherence to dietary recommendations) is encouraged. Likewise, continued compliance with his CPAP machine will also be important. Continued participation in activities which provide mental stimulation and social interaction is also recommended.   When learning new information, he would benefit from information being broken up into small, manageable pieces. He may also find it helpful to articulate the material in his own words and in a context to promote encoding at the onset of a new task. This material may need to be repeated multiple times to promote encoding.  Memory can be improved using internal strategies such as rehearsal, repetition, chunking, mnemonics, association, and imagery. External strategies such as written notes in a consistently used memory journal, visual and nonverbal auditory cues such as a calendar on the refrigerator or appointments with alarm, such as on a cell phone, can also help maximize recall.    Review of Records:   Dustin Berry was seen by [facility] Alric Ran, M.D.) on 01/03/2022 for transient ischemic attack (TIA) concerns. Briefly, about six weeks prior to that appointment, Dustin Berry reported binocular double vision while sitting at his computer desk. Upon closing one eye,  vision would return to normal. This episode lasted approximately three minutes before it resolved. There were no associated symptoms of slurred speech, headaches, or weakness. No additional episodes were reported. His lipid panel was within normal limits and he denied a previous stroke history.  He followed up with Dr. April Manson on 03/07/2022. During this appointment, Dustin Berry and his wife reported ongoing short-term memory dysfunction. Primary examples surrounded trouble with information recall and forgetting to do various tasks. His wife also described an isolated instance where he left the stove on and burned some pots. Trouble with ADLs was denied at that time. However, there is notation to suggest that his wife had taken over bill paying a few years prior due to Dustin Berry forgetting to pay these. Performance on a brief cognitive screening instrument (MOCA) was 21/30. He was seen by Dr. April Manson again on 06/15/2022 and started on rivastigmine. Ultimately, Dustin Berry was referred for a comprehensive neuropsychological evaluation to characterize his cognitive abilities and to assist with diagnostic clarity and treatment planning.   Brain MRI on 12/31/2021 in the context of a recent TIA was negative for any acute/subacute infarct. It revealed mild global parenchymal volume loss and mild microvascular ischemic disease.   Past Medical History:  Diagnosis Date   Abdominal pain 04/01/2013   Acute bronchitis 03/07/2018   Allergic rhinitis 05/12/2008   Aortic atherosclerosis 08/23/2020   Arthritis    Ascending aortic aneurysm 12/13/2018   Atrial fibrillation with RVR 10/21/2012   Echo- EF 50-55%; mild concentric LVH; flow pattern suggestive of impaired LV relaxation; mild mitral valve prolapse, trace mitral regurgitation   Back pain    receiving PT   Benign prostatic hyperplasia 09/05/2012   Boil of buttock 06/24/2011   Bradycardia 04/17/2013   Cataract    Cellulitis of knee, left 04/12/2016   Chest pain with  exertion presumed to be tachycardia related along with SOB 04/01/2013   Degeneration of lumbar intervertebral disc 12/10/2020   Eustachian tube dysfunction, bilateral  03/07/2018   External otitis of left ear 06/11/2020   Fatigue 07/04/2011   GERD (gastroesophageal reflux disease) 03/24/2009   Heart murmur    Hematuria, possible 04/01/2013   Hereditary and idiopathic peripheral neuropathy 05/12/2008   HLD (hyperlipidemia) 08/18/2007   HTN (hypertension) 12/04/2013   Insomnia 06/08/2016   Left knee pain 12/13/2018   Low back pain 12/01/2020   Lumbar spondylosis 05/05/2021   Mitral valve prolapse 07/04/2011   MRSA (methicillin resistant staph aureus) culture positive 5+ years ago   Neck pain 05/05/2021   OSA (obstructive sleep apnea) 07/04/2011   Using CPAP nightly   Pain in right hand 02/04/2020   Palpitations 03/06/2007   R/P MV - mild perfusion defect in basal inferoseptal, basal inferior, mid inferoseptal, and mid inferior regions, consistent w/ infarct/scar; no scintigraphic evidence of inducible myocardial ischemia; prior non transmural infarct cannot be completely excluded; EF 48%; no significant change from previous study   Personal history of colonic polyps 05/12/2008   Radicular pain in left arm 05/05/2021   Sacroiliitis 05/05/2021   Stricture and stenosis of esophagus 03/24/2009   TIA (transient ischemic attack) 11/22/2021   Tinnitus 09/05/2012   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   Vitamin B12 deficiency 06/15/2019   Vitamin D deficiency 05/23/2022    Past Surgical History:  Procedure Laterality Date   ATRIAL FIBRILLATION ABLATION     CARDIOVERSION N/A 04/03/2013   Procedure: CARDIOVERSION;  Surgeon: Pixie Casino, MD;  Location: [facility] ENDOSCOPY;  Service: Cardiovascular;  Laterality: N/A;   COLONOSCOPY  2018   HAND SURGERY     Thumb joint repair   POLYPECTOMY     TEE WITHOUT CARDIOVERSION N/A 04/03/2013   Procedure: TRANSESOPHAGEAL ECHOCARDIOGRAM (TEE);  Surgeon: Pixie Casino, MD;  Location[facility] ENDOSCOPY;  Service: Cardiovascular;  Laterality: N/A;   TONSILLECTOMY AND ADENOIDECTOMY     UPPER GASTROINTESTINAL ENDOSCOPY     dilation    Current Outpatient Medications:    aspirin 81 MG EC tablet, Take 1 tablet (81 mg total) by mouth daily. Swallow whole., Disp: 30 tablet, Rfl: 12   atorvastatin (LIPITOR) 80 MG tablet, Take 1 tablet (80 mg total) by mouth daily., Disp: 90 tablet, Rfl: 3   b complex vitamins capsule, Take 1 capsule by mouth daily., Disp: , Rfl:    lisinopril (ZESTRIL) 10 MG tablet, TAKE 1 TABLET BY MOUTH EVERY DAY, Disp: 90 tablet, Rfl: 3   meloxicam (MOBIC) 15 MG tablet, Take 15 mg by mouth as needed for pain. TAKE 0.5-1 tablet  AS NEEDED, Disp: , Rfl:    metoprolol succinate (TOPROL-XL) 25 MG 24 hr tablet, TAKE 37.'5MG'$  (1.5 TABLETS) DAILY FOR TWO WEEKS, THEN INCREASE TO '50MG'$  (2 TABLETS) DAILY., Disp: 180 tablet, Rfl: 3   rivastigmine (EXELON) 3 MG capsule, Take 1 capsule (3 mg total) by mouth 2 (two) times daily., Disp: 180 capsule, Rfl: 1   tamsulosin (FLOMAX) 0.4 MG CAPS capsule, Take 1 capsule (0.4 mg total) by mouth daily., Disp: 90 capsule, Rfl: 3   VITAMIN D PO, Take 1 tablet by mouth daily in the afternoon., Disp: , Rfl:    zolpidem (AMBIEN) 5 MG tablet, TAKE 1 TABLET BY MOUTH AT BEDTIME AS NEEDED FOR SLEEP., Disp: 30 tablet, Rfl: 0  Clinical Interview:   The following information was obtained during a clinical interview with Dustin Berry prior to cognitive testing.  Cognitive Symptoms: Decreased short-term memory: Endorsed. Mr. Lariccia somewhat downplayed memory concerns, describing generalized forgetfulness, losing his train of thought, and instances where  he will enter rooms and forget his original intention. He did note that his wife who was not present has observed greater memory dysfunction and that memory difficulties have been gradually worsening over the past several years.  Decreased long-term memory: Denied. Decreased  attention/concentration: Denied. Reduced processing speed: Denied. Difficulties with executive functions: Endorsed. He highlighted that his wife would likely comment that he has exhibited chronic difficulties with multi-tasking and organization rather than acute changes. He denied trouble with impulsivity or any significant personality changes.  Difficulties with emotion regulation: Denied. Difficulties with receptive language: Denied. Difficulties with word finding: Denied. However, he did comment that his speech has slowed due to it taking him longer to think of words and string them together appropriately.  Decreased visuoperceptual ability: Denied.  Difficulties completing ADLs: Somewhat. He reported managing his medications independently and denied trouble with driving. He did report trouble remembering to pay bills. This is consistent with medical records which have suggested that his wife took over these responsibilities several years ago due to his forgetfulness in this regard.   Additional Medical History: History of traumatic brain injury/concussion: Denied. History of stroke: Denied. He did experience a likely TIA in December 2022 (see above). No additional events were described.  History of seizure activity: Denied. History of known exposure to toxins: Denied. Symptoms of chronic pain: Endorsed. Currently, he described ongoing pain surrounding his knees, hips, and lower back.  Experience of frequent headaches/migraines: Denied. Frequent instances of dizziness/vertigo: Denied.  Sensory changes: He utilizes hearing aids with benefit. He denied any current changes/difficulties with vision, taste, or smell.  Balance/coordination difficulties: Largely denied. He described his balance as "not bad, not really good," attributing any difficulties to his history of neuropathy and right knee pain. No recent falls were reported.  Other motor difficulties: Denied.  Sleep History: Estimated  hours obtained each night: 7-8 hours.  Difficulties falling asleep: Endorsed. These were said to occur sporadically where he does not feel tired and can remain awake for hours at a time.  Difficulties staying asleep: Denied. Feels rested and refreshed upon awakening: Endorsed "pretty much, not always."  History of snoring: Endorsed. History of waking up gasping for air: Endorsed. Witnessed breath cessation while asleep: Endorsed. He acknowledged a history of sleep apnea and utilizes his CPAP machine "pretty much every night."  History of vivid dreaming: Denied. Excessive movement while asleep: Denied. Instances of acting out his dreams: Denied.  Psychiatric/Behavioral Health History: Depression: He described his current mood as "easy going" and positive. He denied a history of prior mental health concerns or diagnoses. Current or remote suicidal ideation, intent, or plan was denied.  Anxiety: Denied. Mania: Denied. Trauma History: Denied. Visual/auditory hallucinations: Denied. Delusional thoughts: Denied.  Tobacco: Denied. Alcohol: He reported rare alcohol consumption and denied a history of problematic alcohol abuse or dependence.  Recreational drugs: Denied.  Family History: Problem Relation Age of Onset   Dementia Mother        late 55s   Alzheimer's disease Mother    Heart disease Father 12       died with MI   Melanoma Sister    Breast cancer Maternal Grandmother    Stroke Maternal Grandfather    Colon cancer Neg Hx    Esophageal cancer Neg Hx    Stomach cancer Neg Hx    Rectal cancer Neg Hx    Colon polyps Neg Hx    This information was confirmed by Dustin Berry.  Academic/Vocational History: Highest level of educational  attainment: 16 years. He graduated from high school and earned a Dietitian in Personnel officer. He described himself as an average (B/C) student in academic settings. Latin represented a relative weakness in earlier academic settings.   History of developmental delay: Denied. History of grade repetition: Denied. Enrollment in special education courses: Denied. History of LD/ADHD: Denied.  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 He noted memory dysfunction creating some work issues, especially him entering the back room and forgetting what he was searching for. He noted needing to leave sticky notes everywhere to help with recall. 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  Evaluation Results:   Behavioral Observations: Dustin Berry was unaccompanied, arrived to his appointment on time, and was appropriately dressed and groomed. He appeared alert and oriented. Observed gait and station were within normal limits. Gross motor functioning appeared intact upon informal observation and no abnormal movements (e.g., tremors) were noted. His affect was generally relaxed and positive. Spontaneous speech was fluent and word finding difficulties were not observed during the clinical interview. Thought processes were coherent, organized, and normal in content. Insight into his cognitive difficulties appeared adequate.   During testing, sustained attention was appropriate. Task engagement was adequate and he persisted when challenged. Overall, Dustin Berry was cooperative with the clinical interview and subsequent testing procedures.   Adequacy of Effort: The validity of neuropsychological testing is limited by the extent to which the individual being tested may be assumed to have exerted adequate effort during testing. Dustin Berry expressed his intention to perform to the best of his abilities and exhibited adequate task engagement and persistence. Scores across stand-alone and embedded performance validity measures were within expectation. As such, the results of the current evaluation are believed to be a valid representation of Dustin Berry current  cognitive functioning.  Test Results: Dustin Berry was oriented at the time of the current evaluation. He was one day off when stating the current date.   Intellectual abilities based upon educational and vocational attainment were estimated to be in the average range. Premorbid abilities were estimated to be within the average range based upon a single-word reading test.   Processing speed was average to well above average. Basic attention was average to well above average. More complex attention (e.g., working memory) was average. Executive functioning was average to above average. He also performed in the average range across a task assessing safety and judgment.   Assessed receptive language abilities were above average. Likewise, Mr. Kollman did not exhibit any difficulties comprehending task instructions and answered all questions asked of him appropriately. Assessed expressive language (e.g., verbal fluency and confrontation naming) was mildly variable but overall appropriate, ranging from the below average to above average normative ranges.     Assessed visuospatial/visuoconstructional abilities were average to well above average.    Learning (i.e., encoding) of novel verbal information was variable, ranging from the well below average to average normative ranges. Spontaneous delayed recall (i.e., retrieval) of previously learned information was well below average to below average. Retention rates were 75% across a story learning task, 14% across a list learning task, and 40% across a figure drawing task. Performance across recognition tasks was exceptionally low across a list learning task but average across two other tasks, suggesting some evidence for information consolidation.   Results of emotional screening instruments suggested that recent symptoms of generalized anxiety were in the minimal range,  while symptoms of depression were within normal limits. A screening instrument assessing recent  sleep quality suggested the presence of minimal sleep dysfunction.  Tables of Scores:   Note: This summary of test scores accompanies the interpretive report and should not be considered in isolation without reference to the appropriate sections in the text. Descriptors are based on appropriate normative data and may be adjusted based on clinical judgment. Terms such as "Within Normal Limits" and "Outside Normal Limits" are used when a more specific description of the test score cannot be determined.       Percentile - Normative Descriptor > 98 - Exceptionally High 91-97 - Well Above Average 75-90 - Above Average 25-74 - Average 9-24 - Below Average 2-8 - Well Below Average < 2 - Exceptionally Low       Orientation:      Raw Score Percentile   NAB Orientation, Form 1 28/29 --- ---       Cognitive Screening:      Raw Score Percentile   SLUMS: 23/30 --- ---       RBANS, Form A: Standard Score/ Scaled Score Percentile   Total Score 81 10 Below Average  Immediate Memory 78 7 Well Below Average    List Learning 4 2 Well Below Average    Story Memory 8 25 Average  Visuospatial/Constructional 102 55 Average    Figure Copy 14 91 Well Above Average    Line Orientation 14/20 17-25 Below Average to Average  Language 82 12 Below Average    Picture Naming 10/10 51-75 Average    Semantic Fluency 3 1 Exceptionally Low  Attention 94 34 Average    Digit Span 8 25 Average    Coding 10 50 Average  Delayed Memory 71 3 Well Below Average    List Recall 1/10 3-9 Well Below Average    List Recognition 16/20 <2 Exceptionally Low    Story Recall 7 16 Below Average    Story Recognition 11/12 47-68 Average    Figure Recall 7 16 Below Average    Figure Recognition 7/8 53-69 Average        Intellectual Functioning:      Standard Score Percentile   Test of Premorbid Functioning: 99 47 Average       Attention/Executive Function:     Trail Making Test (TMT): Raw Score (T Score) Percentile      Part A 21 secs.,  0 errors (67) 96 Well Above Average    Part B 63 secs.,  1 error (62) 88 Above Average         Scaled Score Percentile   WAIS-IV Digit Span: 12 75 Above Average    Forward 15 95 Well Above Average    Backward 10 50 Average    Sequencing 10 50 Average        Scaled Score Percentile   WAIS-IV Similarities: 9 37 Average       D-KEFS Color-Word Interference Test: Raw Score (Scaled Score) Percentile     Color Naming 34 secs. (10) 50 Average    Word Reading 25 secs. (10) 50 Average    Inhibition 60 secs. (12) 75 Above Average      Total Errors 0 errors (13) 84 Above Average    Inhibition/Switching 65 secs. (12) 75 Above Average      Total Errors 1 error (12) 75 Above Average       D-KEFS Verbal Fluency Test: Raw Score (Scaled Score) Percentile     Letter Total  Correct 31 (9) 37 Average    Category Total Correct 35 (11) 63 Average    Category Switching Total Correct 11 (9) 37 Average    Category Switching Accuracy 10 (9) 37 Average      Total Set Loss Errors 5 (6) 9 Below Average      Total Repetition Errors 10 (3) 1 Exceptionally Low       NAB Executive Functions Module, Form 1: T Score Percentile     Judgment 53 62 Average       Language:     Verbal Fluency Test: Raw Score (T Score) Percentile     Phonemic Fluency (FAS) 31 (42) 21 Below Average    Animal Fluency 20 (53) 62 Average        NAB Language Module, Form 1: T Score Percentile     Auditory Comprehension 57 75 Above Average    Naming 31/31 (59) 82 Above Average       Visuospatial/Visuoconstruction:      Raw Score Percentile   Clock Drawing: 10/10 --- Within Normal Limits       NAB Spatial Module, Form 1: T Score Percentile     Visual Discrimination 47 38 Average        Scaled Score Percentile   WAIS-IV Block Design: 15 95 Well Above Average       Mood and Personality:      Raw Score Percentile   Geriatric Depression Scale: 4 --- Within Normal Limits  Geriatric Anxiety Scale: 3 --- Minimal     Somatic 2 --- Minimal    Cognitive 1 --- Minimal    Affective 0 --- Minimal       Additional Questionnaires:      Raw Score Percentile   PROMIS Sleep Disturbance Questionnaire: 18 --- None to Slight   Informed Consent and Coding/Compliance:   The current evaluation represents a clinical evaluation for the purposes previously outlined by the referral source and is in no way reflective of a forensic evaluation.   Mr. Mcbane was provided with a verbal description of the nature and purpose of the present neuropsychological evaluation. Also reviewed were the foreseeable risks and/or discomforts and benefits of the procedure, limits of confidentiality, and mandatory reporting requirements of this provider. The patient was given the opportunity to ask questions and receive answers about the evaluation. Oral consent to participate was provided by the patient.   This evaluation was conducted by Christia Reading, Ph.D., ABPP-CN, board certified clinical neuropsychologist. Mr. Currier completed a clinical interview with Dr. Melvyn Novas, billed as one unit [phone], and 130 minutes of cognitive testing and scoring, billed as one unit [phone] and three additional units [id number]. Psychometrist Cruzita Lederer, B.S., assisted Dr. Melvyn Novas with test administration and scoring procedures. As a separate and discrete service, Dr. Melvyn Novas spent a total of 160 minutes in interpretation and report writing billed as one unit [phone] and two units [id number].

## 2022-12-09 NOTE — Progress Notes (Signed)
   Psychometrician Note   Cognitive testing was administered to Dustin Berry. by Cruzita Lederer, B.S. (psychometrist) under the supervision of Dr. Christia Reading, Ph.D., licensed psychologist on 12/09/2022. Dustin Berry did not appear overtly distressed by the testing session per behavioral observation or responses across self-report questionnaires. Rest breaks were offered.    The battery of tests administered was selected by Dr. Christia Reading, Ph.D. with consideration to Dustin Berry current level of functioning, the nature of his symptoms, emotional and behavioral responses during interview, level of literacy, observed level of motivation/effort, and the nature of the referral question. This battery was communicated to the psychometrist. Communication between Dr. Christia Reading, Ph.D. and the psychometrist was ongoing throughout the evaluation and Dr. Christia Reading, Ph.D. was immediately accessible at all times. Dr. Christia Reading, Ph.D. provided supervision to the psychometrist on the date of this service to the extent necessary to assure the quality of all services provided.    Dustin Sours Ballard Russell. will return within approximately 1-2 weeks for an interactive feedback session with Dr. Melvyn Novas at which time his test performances, clinical impressions, and treatment recommendations will be reviewed in detail. Dustin Berry understands he can contact our office should he require our assistance before this time.  A total of 130 minutes of billable time were spent face-to-face with Dustin Berry by the psychometrist. This includes both test administration and scoring time. Billing for these services is reflected in the clinical report generated by Dr. Christia Reading, Ph.D.  This note reflects time spent with the psychometrician and does not include test scores or any clinical interpretations made by Dr. Melvyn Novas. The full report will follow in a separate note.

## 2022-12-09 NOTE — Progress Notes (Signed)
Patient ID: Dustin Berry., male   DOB: [birth year], 77 y.o.   MRN: [id number]      HPI: Dustin Berry. is a 77 y.o. male who presents for a 12 month followup cardiology evaluation.     Dustin Berry has a long-standing history of paroxysmal atrial fibrillation. Initially in 2000 he was evaluated at [facility] and was treated at that time with flecainide. He subsequently developed breakthrough arrhythmia's and had done well with Rythmol SR and can, beta blocker. Remotely, he had seen Dr. Rosita Fire at [facility] as well as Dr. Glennon Mac at [facility] for consideration of atrial fibrillation ablation in 2011 at that time a decision not to have therapy. He had done well until recently but developed recurrent problems with recurrent atrial fibrillation despite medical therapy. He also has been diagnosed with obstructive sleep apnea and has been utilizing CPAP therapy with 100% compliance. He ultimately went back to [facility] and on June 6,2014 underwent pulmonary vein isolation of all pulmonary veins and radiofrequency ablation of CTI with bidirectional block noted with RA and CS pacing by Dr. Glennon Mac. He has been on eliquis 5 mg twice a day for anticoagulation. At that time he was taken off his multaq and metoprolol.  When I saw him in 2014 he started to notice white blood pressure elevation. He was unaware of any recurrent atrial fibrillation. He had been using his CPAP therapy. At that time, his blood pressure was 130/100. I elected to add low-dose lisinopril at 5 mg per day socially underwent a 2-D echo Doppler study on 11/21/2013. This showed an ejection fraction in the 45-50% range with grade 1 diastolic dysfunction. No definitive wall motion abnormalities were detected although the possibility was raised concerning possible mid inferior hypocontractility. He did have systolic bowing without definitive prolapse of his mitral valve with mild MR.    He underwent a one-year follow-up evaluation at [facility]  for his atrial fibrillation ablation.  He was maintaining sinus rhythm.  At that time, his anticoagulation was discontinued and he was resumed on baby aspirin for antiplatelet therapy.  He apparently has stopped using CPAP therapy over the past 3 years.  He denies any issues with his sleep.  He is unaware of any significant recurrent palpitations.  He has been monitoring his blood pressure at home and he states this is typically been running approximately 220 systolically.  He denies chest pain or palpitations.  He has back issues which has limited his exercise such that he predominantly just walks.  He no longer plays basketball.  When I saw him in February 2019 he was in the process of selling his house, and he is building a new house.  In the interim he was notusing his CPAP and that this is in a box preparing for his move.  He also has moved his [place] center out of [employer] to the [place] region.  He is unaware of any recurrent atrial fibrillation.  Occasionally he admitted to palpitations which he senses particularly when he lies on his side.  He underwent a follow-up echo Doppler study in February 2018 which showed an EF of 50-55% with grade 1 diastolic dysfunction.  His aortic root was mildly dilated and measured 4.5 cm.  There was mitral valve prolapse involving the anterior leaflet and posterior leaflet with mild MR.  There was mild biatrial enlargement.  In follow-up of his aortic root increase he underwent CT angios of his chest and aorta which showed  a 4.3 cm a sending thoracic aortic aneurysm on 03/02/2017.  He was on lisinopril 5 mg for hypertension, atorvastatin 20 for hyperlipidemia.    I saw him on 08/05/2019 at which time he had remained stable from a cardiac perspective.  He was unaware of any recurrent arrhythmia.  He denied any chest pain. He had noticed his blood pressures slightly elevated and labile with typical blood pressures around 683 systolically.  He is unaware of any  breakthrough atrial fibrillation.    He underwent laboratory with Dr. Jenny Reichmann in February 2020.  Total cholesterol was 149, HDL 40.9, LDL 95, triglycerides 67.  When I last saw him, I recommended titration of lisinopril and apparently subsequent to that evaluation his dose was further titrated to 10 mg daily.  I was saw him in October 2021 and over the prior year he had remained stable from a cardiac standpoint.  He was unaware of any recurrent atrial fibrillation.  However there have been times we has noticed some slight increase in palpitations particularly if he would drink 2 to 3 glasses of caffeinated tea.  With caffeine reduction these have improved.  Recently, he has been trying to eat better.  He continued to work approximately 5 and half days per week at his [employer] which has moved to the [place] area.  He typically walks about [phone] steps per day usually while at work but denies any significant routine exercise.  He does cut his grass and work in his yard on weekends.  Over the past year he underwent right thumb surgery by Dr. Amedeo Plenty.  He denied chest pain or change in exertional symptomatology.  He saw Dr. Jenny Reichmann for his primary care evaluation but no laboratory was obtained this year.  His last blood work was done in February 2020.    I last saw him on June 01, 2021.  He continued to work at his [employer].  He has been having some issues with his SI joint and is recently been evaluated by Dr. Noah Delaine at [facility].  At times he notes an occasional isolated palpitation but at times he may notice brief burst of heart rate irregularity.  He has been taking metoprolol succinate 25 mg daily as well as lisinopril 10 mg for hypertension.  He has been taking meloxicam for his arthritic issues.  He continues to be on atorvastatin 40 mg daily.  He denies any chest pain PND orthopnea.  He denies any shortness of breath.  His blood pressure at home has been running in the upper 130s to 140 range.   At his evaluation, I recommended that he undergo a follow-up echo Doppler study.  I also titrated his metoprolol from 25 mg daily to 37.5 mg for the next 2 weeks and depending upon blood pressure and heart rate response increased to 50 mg if necessary.  He underwent an echo Doppler study on June 28, 2021.  He was found to have dilated aortic root and thoracic aortic aneurysm.  EF by 3D volume was 46%.  There was grade 1 diastolic dysfunction.  It was recommended that he undergo ECG gated CTA of his aorta for further evaluation of his aortic ascending aortic aneurysm with particular attention to the sinus of Valsalva.  I recommended that he see Dr. Gilford Raid for initial evaluation.  Apparently on that particular day Dr. Cyndia Bent was unable to be in the office and he was seen by PA.  He subsequently was evaluated at [facility]  by Dr. Mali Haynes.  And had a follow-up 2D echo Doppler study done in April 2023 which showed an EF at 50%.  Left atrium was 4.3 cm.  There was mild mitral regurgitation mild aortic regurgitation.  Ascending aorta measured 4.6 cm.  He is scheduled to have follow-up with him in 1 year.  Dustin Berry states his blood pressure at home has been in the 120/80 range.  At times he has noticed some palpitations and was wondering if he had gone back into atrial fibrillation.  Upon further questioning today, it may not be that he went into atrial fibrillation but perhaps may have had's premature beats.  He was evaluated by Dr. Jenny Reichmann on June 19 following an episode of palpitations Sunday night and that morning.  Apparently he was in sinus rhythm at the time.  He underwent laboratory and is to undergo a 2-week cardiac monitoring.  I last saw him on May 26, 2023.  At that time he felt well and denied any chest pain or exertional dyspnea.   He has been on baby aspirin and apparently remotely his anticoagulation was discontinued.  He continues to be on metoprolol succinate 50 mg daily as well as  atorvastatin 80 mg.  He takes Lyrica at bedtime and is also on lisinopril 10 mg daily.  During that evaluation, he had noticed palpitations for several days for which she subsequently saw Dr. Jenny Reichmann.  A Zio patch monitor was ordered.  Since I last saw him, he has done relatively well.  He is still working at his [employer] area.  He has a follow-up appointment to see Dr. Mali Hughes at [facility] this spring in follow-up of his ascending aortic dilatation previously measured at 4.6 cm.  For Zio patch monitor which showed predominant rhythm its normal sinus rhythm at 61 bpm.  He had 1 short episode of nonsustained VT lasting 4 beats with a heart rate range at 119 and 143.  There were 57 short bursts of SVT with the fastest interval at 164 lasting 4 beats and the longest interval at 14 beats at an average rate at 143.  He had an episode of ventricular trigeminy lasting 29.5 seconds.  He denies chest pain or shortness of breath.  He may still note an occasional palpitation.  He denies presyncope or syncope.  He presents for follow-up evaluation.  Past Medical History:  Diagnosis Date   Abdominal pain 04/01/2013   Acute bronchitis 03/07/2018   Allergic rhinitis 05/12/2008   Aortic atherosclerosis 08/23/2020   Arthritis    Ascending aortic aneurysm 12/13/2018   Atrial fibrillation with RVR 10/21/2012   Echo- EF 50-55%; mild concentric LVH; flow pattern suggestive of impaired LV relaxation; mild mitral valve prolapse, trace mitral regurgitation   Back pain    receiving PT   Benign prostatic hyperplasia 09/05/2012   Boil of buttock 06/24/2011   Bradycardia 04/17/2013   Cataract    Cellulitis of knee, left 04/12/2016   Chest pain with exertion presumed to be tachycardia related along with SOB 04/01/2013   Degeneration of lumbar intervertebral disc 12/10/2020   Eustachian tube dysfunction, bilateral 03/07/2018   External otitis of left ear 06/11/2020   Fatigue 07/04/2011   GERD  (gastroesophageal reflux disease) 03/24/2009   Heart murmur    Hematuria, possible 04/01/2013   Hereditary and idiopathic peripheral neuropathy 05/12/2008   HLD (hyperlipidemia) 08/18/2007   HTN (hypertension) 12/04/2013   Insomnia 06/08/2016   Left knee pain 12/13/2018  Low back pain 12/01/2020   Lumbar spondylosis 05/05/2021   Mild cognitive impairment of uncertain or unknown etiology 12/09/2022   Mitral valve prolapse 07/04/2011   MRSA (methicillin resistant staph aureus) culture positive 5+ years ago   Neck pain 05/05/2021   OSA (obstructive sleep apnea) 07/04/2011   Using CPAP nightly   Pain in right hand 02/04/2020   Palpitations 03/06/2007   R/P MV - mild perfusion defect in basal inferoseptal, basal inferior, mid inferoseptal, and mid inferior regions, consistent w/ infarct/scar; no scintigraphic evidence of inducible myocardial ischemia; prior non transmural infarct cannot be completely excluded; EF 48%; no significant change from previous study   Personal history of colonic polyps 05/12/2008   Radicular pain in left arm 05/05/2021   Sacroiliitis 05/05/2021   Stricture and stenosis of esophagus 03/24/2009   TIA (transient ischemic attack) 11/22/2021   Tinnitus 09/05/2012   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   Vitamin B12 deficiency 06/15/2019   Vitamin D deficiency 05/23/2022    Past Surgical History:  Procedure Laterality Date   ATRIAL FIBRILLATION ABLATION     CARDIOVERSION N/A 04/03/2013   Procedure: CARDIOVERSION;  Surgeon: Pixie Casino, MD;  Location: [facility] ENDOSCOPY;  Service: Cardiovascular;  Laterality: N/A;   COLONOSCOPY  2018   HAND SURGERY     Thumb joint repair   POLYPECTOMY     TEE WITHOUT CARDIOVERSION N/A 04/03/2013   Procedure: TRANSESOPHAGEAL ECHOCARDIOGRAM (TEE);  Surgeon: Pixie Casino, MD;  Location: [facility] ENDOSCOPY;  Service: Cardiovascular;  Laterality: N/A;   TONSILLECTOMY AND ADENOIDECTOMY     UPPER GASTROINTESTINAL ENDOSCOPY     dilation     Allergies  Allergen Reactions   Penicillin G Rash   Quinolones Other (See Comments)    Other reaction(s): Other (See Comments) Fluroquinolone antibiotics should be avoided in patients with history of aortic aneurysm/dissection   Amoxil [Amoxicillin] Rash    Current Outpatient Medications  Medication Sig Dispense Refill   aspirin 81 MG EC tablet Take 1 tablet (81 mg total) by mouth daily. Swallow whole. 30 tablet 12   atorvastatin (LIPITOR) 80 MG tablet TAKE 1 TABLET BY MOUTH EVERY DAY 90 tablet 3   b complex vitamins capsule Take 1 capsule by mouth daily.     lisinopril (ZESTRIL) 10 MG tablet TAKE 1 TABLET BY MOUTH EVERY DAY 90 tablet 3   meloxicam (MOBIC) 15 MG tablet Take 15 mg by mouth as needed for pain. TAKE 0.5-1 tablet  AS NEEDED     rivastigmine (EXELON) 3 MG capsule Take 1 capsule (3 mg total) by mouth 2 (two) times daily. 180 capsule 1   tamsulosin (FLOMAX) 0.4 MG CAPS capsule Take 1 capsule (0.4 mg total) by mouth daily. 90 capsule 3   VITAMIN D PO Take 1 tablet by mouth daily in the afternoon.     zolpidem (AMBIEN) 5 MG tablet TAKE 1 TABLET BY MOUTH AT BEDTIME AS NEEDED FOR SLEEP. 30 tablet 0   metoprolol succinate (TOPROL-XL) 25 MG 24 hr tablet Take 2 tablets (50 mg total) by mouth every morning AND 0.5 tablets (12.5 mg total) every evening. 225 tablet 3   No current facility-administered medications for this visit.    Social History   Socioeconomic History   Marital status: Married    Spouse name: Not on file   Number of children: Not on file   Years of education: 16   Highest education level: Bachelor's degree (e.g., BA, AB, BS)  Occupational History   Occupation: Retail  Comment: Retail; semi-retired Tourist information centre manager, former self employed tech co support  Tobacco Use   Smoking status: Never   Smokeless tobacco: Never  Vaping Use   Vaping Use: Never used  Substance and Sexual Activity   Alcohol use: Not Currently    Alcohol/week: 0.0 - 1.0 standard drinks  of alcohol    Comment: infrequent glass of wine   Drug use: No   Sexual activity: Not on file  Other Topics Concern   Not on file  Social History Narrative   Not on file   Social Determinants of Health   Financial Resource Strain: Not on file  Food Insecurity: Not on file  Transportation Needs: Not on file  Physical Activity: Not on file  Stress: Not on file  Social Connections: Not on file  Intimate Partner Violence: Not on file    Family History  Problem Relation Age of Onset   Dementia Mother        late 2s   Alzheimer's disease Mother    Heart disease Father 48       died with MI   Melanoma Sister    Breast cancer Maternal Grandmother    Stroke Maternal Grandfather    Colon cancer Neg Hx    Esophageal cancer Neg Hx    Stomach cancer Neg Hx    Rectal cancer Neg Hx    Colon polyps Neg Hx    Social history is notable in that he is married has 4 children. He never smoked cigarettes. He does try to exercise. Previously had played basketball. 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    ROS General: Negative; No fevers, chills, or night sweats;  HEENT: Negative; No changes in vision or hearing, sinus congestion, difficulty swallowing Pulmonary: Negative; No cough, wheezing, shortness of breath, hemoptysis Cardiovascular: See history of present illness GI: Negative; No nausea, vomiting, diarrhea, or abdominal pain GU: Negative; No dysuria, hematuria, or difficulty voiding Musculoskeletal: Negative; no myalgias, joint pain, or weakness Hematologic/Oncology: Negative; no easy bruising, bleeding Endocrine: Negative; no heat/cold intolerance; no diabetes Neuro: Negative; no changes in balance, headaches Skin: Negative; No rashes or skin lesions Psychiatric: Negative; No behavioral problems, depression Sleep: h/o obstructive sleep apnea treated with CPAP.  Last sleep study in March 2018 showed increased syndrome without significant sleep apnea.  No snoring,  daytime sleepiness, hypersomnolence, bruxism, restless legs, hypnogognic hallucinations, no cataplexy Other comprehensive 14 point system review is negative.   PE BP 92/68 (BP Location: Left Arm, Patient Position: Sitting, Cuff Size: Normal)   Pulse 61   Ht '6\' 4"'$  (1.93 m)   Wt 227 lb (103 kg)   SpO2 97%   BMI 27.63 kg/m    Repeat blood pressure by me was 110/68  Wt Readings from Last 3 Encounters:  12/12/22 227 lb (103 kg)  11/21/22 224 lb (101.6 kg)  08/23/22 214 lb (97.1 kg)    BP 92/68 (BP Location: Left Arm, Patient Position: Sitting, Cuff Size: Normal)   Pulse 61   Ht '6\' 4"'$  (1.93 m)   Wt 227 lb (103 kg)   SpO2 97%   BMI 27.63 kg/m  General: Alert, oriented, no distress.  Skin: normal turgor, no rashes, warm and dry HEENT: Normocephalic, atraumatic. Pupils equal round and reactive to light; sclera anicteric; extraocular muscles intact; Nose without nasal septal hypertrophy Mouth/Parynx benign; Mallinpatti scale 3 Neck: No JVD, no carotid bruits; normal carotid upstroke Lungs: clear to ausculatation and percussion; no wheezing or rales Chest  wall: without tenderness to palpitation Heart: PMI not displaced, RRR, s1 s2 normal, 1/6 systolic murmur, no diastolic murmur, no rubs, gallops, thrills, or heaves Abdomen: soft, nontender; no hepatosplenomehaly, BS+; abdominal aorta nontender and not dilated by palpation. Back: no CVA tenderness Pulses 2+ Musculoskeletal: full range of motion, normal strength, no joint deformities Extremities: no clubbing cyanosis or edema, Homan's sign negative  Neurologic: grossly nonfocal; Cranial nerves grossly wnl Psychologic: Normal mood and affect  December 12, 2022 ECG (independently read by me): NSR at 61, no ectopy  May 25, 2022 ECG (independently read by me): Sinus bradycardia at 55, no ectopy  June 01, 2021 ECG (independently read by me): Normal sinus rhythm at 69 bpm with an isolated PVC.  Nonspecific ST abnormality.  QTc interval  454 ms.  October 2021 ECG (independently read by me): NSR ay 60; NSST changes; QTc 430 msec  August 2020 ECG (independently read by me): Normal sinus rhythm at 62 bpm.  Nonspecific T changes.  QTc interval normal at 414 ms.  February 2019 ECG (independently read by me): Normal sinus rhythm at 70 bpm.  Isolated PVC.  Nonspecific ST changes.  QTc interval 425 ms.  January 2018 ECG (independently read by me): Normal sinus rhythm at 73 bpm.  PR interval 170 ms, QTc interval 423 ms.  Nonspecific ST-T changes.  December 2014 ECG: Normal sinus rhythm at 66 beats per minute. Nonspecific ST-T changes. QTc interval 429 ms.  LABS:     Latest Ref Rng & Units 11/21/2022   10:34 AM 05/23/2022    2:45 PM 11/22/2021   11:10 AM  BMP  Glucose 70 - 99 mg/dL 102  97  90   BUN 6 - 23 mg/dL '15  17  15   '$ Creatinine 0.40 - 1.50 mg/dL 1.11  1.13  1.15   Sodium 135 - 145 mEq/L 142  140  144   Potassium 3.5 - 5.1 mEq/L 4.0  4.1  4.3   Chloride 96 - 112 mEq/L 107  107  108   CO2 19 - 32 mEq/L '27  30  29   '$ Calcium 8.4 - 10.5 mg/dL 9.5  9.5  9.6       Latest Ref Rng & Units 11/21/2022   10:34 AM 05/23/2022    2:45 PM 11/22/2021   11:10 AM  Hepatic Function  Total Protein 6.0 - 8.3 g/dL 6.8  6.1  6.5   Albumin 3.5 - 5.2 g/dL 4.2  3.9  4.0   AST 0 - 37 U/L '27  26  19   '$ ALT 0 - 53 U/L '31  31  22   '$ Alk Phosphatase 39 - 117 U/L 64  70  61   Total Bilirubin 0.2 - 1.2 mg/dL 0.8  0.6  0.9   Bilirubin, Direct 0.0 - 0.3 mg/dL 0.2  0.2  0.2       Latest Ref Rng & Units 11/21/2022   10:34 AM 05/23/2022    2:45 PM 11/22/2021   11:10 AM  CBC  WBC 4.0 - 10.5 K/uL 5.4  3.8  4.6   Hemoglobin 13.0 - 17.0 g/dL 15.6  14.2  15.0   Hematocrit 39.0 - 52.0 % 45.6  42.2  45.2   Platelets 150.0 - 400.0 K/uL 213.0  196.0  219.0    Lab Results  Component Value Date   MCV 91.2 11/21/2022   MCV 90.6 05/23/2022   MCV 91.7 11/22/2021   Lab Results  Component Value Date   TSH  2.68 11/21/2022   Lipid Panel      Component Value Date/Time   CHOL 102 11/21/2022 1034   CHOL 178 09/18/2020 1038   TRIG 48.0 11/21/2022 1034   HDL 49.10 11/21/2022 1034   HDL 40 09/18/2020 1038   CHOLHDL 2 11/21/2022 1034   VLDL 9.6 11/21/2022 1034   LDLCALC 43 11/21/2022 1034   LDLCALC 122 (H) 09/18/2020 1038    CARDIOLOGY STUDIES:  Patch Wear Time:  13 days and 4 hours (2023-06-24T20:22:07-399 to 2023-07-08T00:42:09-399)   Patient had a min HR of 48 bpm, max HR of 164 bpm, and avg HR of 61 bpm. Predominant underlying rhythm was Sinus Rhythm. 1 run of Ventricular Tachycardia occurred lasting 4 beats with a max rate of 143 bpm (avg 135 bpm). 57 Supraventricular Tachycardia runs occurred, the run with the fastest interval lasting 4 beats with a max rate of 164 bpm, the longest lasting 14 beats with an avg rate of 143 bpm. Isolated SVEs were rare (<1.0%), SVE Couplets were rare (<1.0%), and SVE Triplets were rare (<1.0%). Isolated VEs were occasional (2.6%, [id number]), VE Couplets were rare (<1.0%, 377), and VE Triplets were rare (<1.0%, 20). Ventricular Bigeminy and Trigeminy were present.   Predominant rhythm was sinus rhythm with an average rate 61 bpm.  There was 1 short episode of nonsustained VT lasting 4 beats with a heart rate range of 1 19-1 43 (average 135 bpm.  There were short bursts of SVT with the fastest interval at 164 bpm and lasting 4 beats.  The longest interval was 14 beats at an average rate at 143.  PVCs were present.  The patient had 1 episode of ventricular trigeminy lasting 29.5 seconds.  There were no significant sinus pauses.  There is no evidence of any atrial fibrillation.  IMPRESSION:  1. PAF (paroxysmal atrial fibrillation) ([place]): Status post A-fib ablation with pulm vein isolation in 2014 been.   2. Essential hypertension   3. Aneurysm of ascending aorta without rupture ([place])   4. Mitral valve prolapse with mild MR   5. Hyperlipidemia, unspecified hyperlipidemia type     ASSESSMENT AND  PLAN: Dustin Berry is a 77 year-old Caucasian male who has a history of paroxysmal atrial fibrillation dating back to 2000 when he was initiated on therapy at [facility].  He underwent successful radiofrequency AF ablation with pulmonary vein isolation in June 2014 by Dr. Norm Salt at [facility]. He has been maintaining sinus rhythm.  He had been on eliquis anticoagulation and this was discontinued by his [place] and now he is just on aspirin 81 mg.  An echo Doppler study in  2014 showed an ejection fraction of 45-50%.  The last echo of February 2018  showed an EF of 25-49%, grade 1 diastolic dysfunction, mitral valve prolapse involving both leaflets with mild MR, mild biatrial enlargement, and mild dilation of his ascending aorta at 4.5 cm.  A CT angio of his chest showed a 4.3 cm descending thoracic aortic aneurysm.  He continues to be asymptomatic with reference to chest pain.  When last seen by me in June 2022 his blood pressure was elevated and he had experienced some mild palpitations.  I subsequently further titrated his metoprolol succinate to 37.5 mg and then to 50 mg daily.  An echo Doppler study was recommended and this revealed EF at 46% of raise concern for dilated aortic root and thoracic aortic aneurysm.  He was subsequently evaluated at [facility] by Dr. Mali Hayes and had  repeat echo Doppler assessment in April 2023.  Valuation with me in June 2023 he had experienced some palpitations and was concerned whether or not he had gone back in A-fib.  He was in sinus rhythm.  I reviewed his Zio patch monitor with him which showed predominant sinus rhythm with a rate at 81 bpm.  Slowest heart rate was 48 bpm with maximum 164 bpm.  He had 1 short nonsustained run of VT lasting 4 beats with an average rate of 135.  There were 57 short episodes of SVT with the fastest interval lasting 4 beats at a maximum rate at 164 and the longest interval lasting 14 beats at an average rate at 143.  He was also  noted to have PACs, bigeminy and trigeminy.  Pressure today is stable but on the low side and when rechecked by me was 110/68.  He states blood pressure at home typically runs 643P to 295J systolically.  He currently is on metoprolol succinate 50 mg in the morning in addition to lisinopril 10 mg daily.  I am recommending slight titration of his metoprolol succinate and he will take 50 mg in the morning and 12.5 mg at night.  If he experiences continued palpitations he will increase his evening dose to 25 mg and if blood pressure gets low lisinopril may need to be further decreased.  He continues to be on atorvastatin 80 mg daily for hyperlipidemia.  Lipid studies on November 21, 2022 showed excellent LDL cholesterol at 43 triglycerides 48 total cholesterol 102.  He will be seeing Dr. Tye Maryland in follow-up of his ascending thoracic aortic aneurysm.  I will see him in 6 to 8 follow-up evaluation or sooner as needed.   Troy Sine, MD, [facility]  12/12/2022 1:06 PM

## 2022-12-10 ENCOUNTER — Other Ambulatory Visit: Payer: Self-pay | Admitting: Internal Medicine

## 2022-12-10 NOTE — Telephone Encounter (Signed)
Please refill as per office routine med refill policy (all routine meds to be refilled for 3 mo or monthly (per pt preference) up to one year from last visit, then month to month grace period for 3 mo, then further med refills will have to be denied) ? ?

## 2022-12-12 ENCOUNTER — Ambulatory Visit: Payer: Medicare HMO | Attending: Cardiovascular Disease | Admitting: Cardiovascular Disease

## 2022-12-12 ENCOUNTER — Encounter: Payer: Self-pay | Admitting: Neurology

## 2022-12-12 ENCOUNTER — Encounter: Payer: Self-pay | Admitting: Cardiovascular Disease

## 2022-12-12 VITALS — BP 92/68 | HR 61 | Ht 76.0 in | Wt 227.0 lb

## 2022-12-12 DIAGNOSIS — I10 Essential (primary) hypertension: Secondary | ICD-10-CM

## 2022-12-12 DIAGNOSIS — I341 Nonrheumatic mitral (valve) prolapse: Secondary | ICD-10-CM

## 2022-12-12 DIAGNOSIS — I7121 Aneurysm of the ascending aorta, without rupture: Secondary | ICD-10-CM | POA: Diagnosis not present

## 2022-12-12 DIAGNOSIS — I480 Paroxysmal atrial fibrillation: Secondary | ICD-10-CM

## 2022-12-12 DIAGNOSIS — E785 Hyperlipidemia, unspecified: Secondary | ICD-10-CM

## 2022-12-12 MED ORDER — METOPROLOL SUCCINATE ER 25 MG PO TB24: ORAL_TABLET | ORAL | 3 refills | Status: DC

## 2022-12-12 NOTE — Progress Notes (Signed)
Patient had his neuropsych testing completed 12/09/22, Dr Melvyn Novas.   Dustin Berry pattern of performance is suggestive of performance variability across both encoding (i.e., learning) and delayed retrieval aspects of verbal and visual memory. Retrieval aspects did exhibit somewhat greater dysfunction relative to encoding. Variability was also exhibited across memory recognition trials. Outside of memory, performances were largely age-appropriate across other assessed domains. This includes processing speed, attention/concentration, executive functioning, receptive and expressive language, and visuospatial abilities. Functionally, Dustin Berry did acknowledge trouble remembering to pay bills and some trouble with work performance due to memory dysfunction. At the present time, I feel he best meets diagnostic criteria for a Mild Neurocognitive Disorder ("mild cognitive impairment").   The etiology for ongoing dysfunction is unclear as overall testing patterns are nonspecific. Dustin Berry did exhibit weakness surrounding memory. Retention rates across memory tasks ranged from 14% to 75%, suggesting some concern for quick forgetting. However, despite poor performance across a list-based recognition trial, story and figure-based recognition trials were strong. Overall, there is not strong evidence to suggest a profound storage impairment, nor current testing patterns which would align with a neurodegenerative illness such as Alzheimer's disease. However, given some weakness, continued monitoring will be important as the extremely early stages of this illness cannot be fully ruled out.    He does not exhibit behavioral or cognitive characteristics of Lewy body disease, frontotemporal lobar degeneration, or another more rare parkinsonian presentation. While memory variability can be impacted by cerebrovascular disease, his recent MRI suggested only mild changes which may in fact be age-appropriate.

## 2022-12-12 NOTE — Patient Instructions (Signed)
Medication Instructions:  Increase Metoprolol- take '50mg'$  (2 tablets) in morning and 12.'5mg'$  (1/2 tablet) in evening *If you need a refill on your cardiac medications before your next appointment, please call your pharmacy*   Lab Work: None If you have labs (blood work) drawn today and your tests are completely normal, you will receive your results only by: [place] (if you have MyChart) OR A paper copy in the mail If you have any lab test that is abnormal or we need to change your treatment, we will call you to review the results.   Testing/Procedures: None   Follow-Up: At [facility], you and your health needs are our priority.  As part of our continuing mission to provide you with exceptional heart care, we have created designated Provider Care Teams.  These Care Teams include your primary Cardiologist (physician) and Advanced Practice Providers (APPs -  Physician Assistants and Nurse Practitioners) who all work together to provide you with the care you need, when you need it.  We recommend signing up for the patient portal called "MyChart".  Sign up information is provided on this After Visit Summary.  MyChart is used to connect with patients for Virtual Visits (Telemedicine).  Patients are able to view lab/test results, encounter notes, upcoming appointments, etc.  Non-urgent messages can be sent to your provider as well.   To learn more about what you can do with MyChart, go to [email or web address].    Your next appointment:   6-8 mos  The format for your next appointment:   In Person  Provider:   Shelva Majestic, MD    Other Instructions None  Important Information About Sugar

## 2022-12-15 ENCOUNTER — Ambulatory Visit: Payer: Medicare HMO | Admitting: Psychology

## 2022-12-15 DIAGNOSIS — G3184 Mild cognitive impairment, so stated: Secondary | ICD-10-CM

## 2022-12-15 NOTE — Progress Notes (Signed)
   Neuropsychology Feedback Session Tillie Rung. [facility] Department of Neurology  Reason for Referral:   Mahmoud Blazejewski. is a 77 y.o. right-handed Caucasian male referred by  Alric Ran, M.D. , to characterize his current cognitive functioning and assist with diagnostic clarity and treatment planning in the context of subjective cognitive decline.   Feedback:   Mr. Tracz completed a comprehensive neuropsychological evaluation on 12/09/2022. Please refer to that encounter for the full report and recommendations. Briefly, results suggested performance variability across both encoding (i.e., learning) and delayed retrieval aspects of verbal and visual memory. Retrieval aspects did exhibit somewhat greater dysfunction relative to encoding. Variability was also exhibited across memory recognition trials. Outside of memory, performances were largely age-appropriate across other assessed domains. The etiology for ongoing dysfunction is unclear as overall testing patterns are nonspecific. Mr. Lortie did exhibit weakness surrounding memory. Retention rates across memory tasks ranged from 14% to 75%, suggesting some concern for quick forgetting. However, despite poor performance across a list-based recognition trial, story and figure-based recognition trials were strong. Overall, there is not strong evidence to suggest a profound storage impairment, nor current testing patterns which would align with a neurodegenerative illness such as Alzheimer's disease. However, given some weakness, continued monitoring will be important as the extremely early stages of this illness cannot be fully ruled out.   Mr. Dupras was accompanied by his wife during the current feedback session. Content of the current session focused on the results of his neuropsychological evaluation. Mr. Raborn was given the opportunity to ask questions and his questions were answered. He was encouraged to reach out should  additional questions arise. A copy of his report was provided at the conclusion of the visit.      22 minutes were spent conducting the current feedback session with Mr. Kessinger, billed as one unit [id number].

## 2022-12-18 DIAGNOSIS — I719 Aortic aneurysm of unspecified site, without rupture: Secondary | ICD-10-CM | POA: Diagnosis not present

## 2022-12-18 DIAGNOSIS — I10 Essential (primary) hypertension: Secondary | ICD-10-CM | POA: Diagnosis not present

## 2022-12-18 DIAGNOSIS — E785 Hyperlipidemia, unspecified: Secondary | ICD-10-CM | POA: Diagnosis not present

## 2022-12-18 DIAGNOSIS — G4733 Obstructive sleep apnea (adult) (pediatric): Secondary | ICD-10-CM | POA: Diagnosis not present

## 2022-12-18 DIAGNOSIS — D6869 Other thrombophilia: Secondary | ICD-10-CM | POA: Diagnosis not present

## 2022-12-18 DIAGNOSIS — Z880 Allergy status to penicillin: Secondary | ICD-10-CM | POA: Diagnosis not present

## 2022-12-18 DIAGNOSIS — R413 Other amnesia: Secondary | ICD-10-CM | POA: Diagnosis not present

## 2022-12-18 DIAGNOSIS — G4700 Insomnia, unspecified: Secondary | ICD-10-CM | POA: Diagnosis not present

## 2022-12-18 DIAGNOSIS — N400 Enlarged prostate without lower urinary tract symptoms: Secondary | ICD-10-CM | POA: Diagnosis not present

## 2022-12-18 DIAGNOSIS — Z8249 Family history of ischemic heart disease and other diseases of the circulatory system: Secondary | ICD-10-CM | POA: Diagnosis not present

## 2022-12-18 DIAGNOSIS — Z791 Long term (current) use of non-steroidal anti-inflammatories (NSAID): Secondary | ICD-10-CM | POA: Diagnosis not present

## 2022-12-18 DIAGNOSIS — I4891 Unspecified atrial fibrillation: Secondary | ICD-10-CM | POA: Diagnosis not present

## 2022-12-29 DIAGNOSIS — G4733 Obstructive sleep apnea (adult) (pediatric): Secondary | ICD-10-CM | POA: Diagnosis not present

## 2023-01-16 ENCOUNTER — Encounter: Payer: Self-pay | Admitting: Neurology

## 2023-01-16 ENCOUNTER — Ambulatory Visit: Payer: Medicare HMO | Admitting: Neurology

## 2023-01-16 VITALS — BP 136/85 | HR 61 | Ht 76.0 in | Wt 232.5 lb

## 2023-01-16 DIAGNOSIS — G3184 Mild cognitive impairment, so stated: Secondary | ICD-10-CM

## 2023-01-16 MED ORDER — DULOXETINE HCL 30 MG PO CPEP: 30.0000 mg | ORAL_CAPSULE | Freq: Every day | ORAL | 6 refills | Status: DC

## 2023-01-16 NOTE — Progress Notes (Signed)
[facility]  PATIENT: Dustin Berry. DOB: [birth year]  REQUESTING CLINICIAN: Biagio Borg, MD HISTORY FROM: Patient and Spouse  REASON FOR VISIT: Memory problem    HISTORICAL  CHIEF COMPLAINT:  Chief Complaint  Patient presents with   Follow-up    Rm 13. Patient reports no changes since last visit.     INTERVAL HISTORY 01/16/2023:  Patient presents today for follow-up, last visit was in July.  At that time, he followed up with Dr. Melvyn Novas neuropsych, had a full test completed and he was diagnosed with mild cognitive impairment.  He reports that his symptoms are still the same.  They are not getting worse.  Currently he is on Exelon 3 mg twice daily.  For his neuropathic pain, he did try pregabalin but did not see any relief therefore he discontinued.  No other complaints.   INTERVAL HISTORY 06/15/2022:  Patient presents today for follow-up, he presents alone.  At last visit due to his bradycardia we defer starting Aricept or any medications until completion of the neuropsych testing.  He reported after going home and reading about the medication, he would like to start it now.  I did inform him that his bradycardia is a relative contraindication to Aricept but we can start rivastigmine. He is comfortable with plan. His neuropsych testing is scheduled for January.  He also mentioned that since starting the pregabalin, now when walking he will have cramp-like pain in bilateral calf and his nighttime pain is not well controlled.    INTERVAL HISTORY 03/07/2022:  Patient presents today for follow-up, at last visit plan was to start pregabalin for his neuropathic pain.  He reported the pain improved.  Today he is concerned for his memory.  He reported he has issue with recall, his long-term memory and short-term memory are intact.  Wife thinks he does not do all the tasks he supposed to do, he does forget a lot.  There was time at home where he forget and left the stove  on, burning some pots.  He still drives, does not have any recent accident or being lost in family or places.  He reported mother had a history of Alzheimer's disease, diagnosed in the 63s.  He still independent, able to for perform all ADLs and a IADLs.  Denies any word finding difficulty denies forgetting names of family members.  Wife took over the finances a few years ago because he did forget to pay bills on time.   HISTORY OF PRESENT ILLNESS:  This is a 77 year old gentleman with past medical history of hypertension hyperlipidemia, previously atrial fibrillation status post ablation who was referred by PMD for TIA work up.  Patient reports 6 weeks ago while at a computer desk he noted  binocular double vision, on closing 1 eye his vision will be back to normal but with both eyes he will have double vision.  The entire episode lasted about 3 minutes then resolved.  There were no associated symptoms with the blurry vision, denies any headaches, denies any numbness, no weakness and no slurred speech.  He never experienced an episode like that in the past and has not had any additional episodes.  He did follow-up with his primary care doctor who obtained a stroke labs and refer him to neurology for TIA work-up.  His lipid panel was within normal limits and a hemoglobin A1c was 5.7.  He denies any previous history of strokes.     OTHER MEDICAL CONDITIONS: HLD,  HTN, CAD   REVIEW OF SYSTEMS: Full 14 system review of systems performed and negative with exception of: as noted in the HPI   ALLERGIES: Allergies  Allergen Reactions   Penicillin G Rash   Quinolones Other (See Comments)    Other reaction(s): Other (See Comments) Fluroquinolone antibiotics should be avoided in patients with history of aortic aneurysm/dissection   Amoxil [Amoxicillin] Rash    HOME MEDICATIONS: Outpatient Medications Prior to Visit  Medication Sig Dispense Refill   aspirin 81 MG EC tablet Take 1 tablet (81 mg total) by  mouth daily. Swallow whole. 30 tablet 12   atorvastatin (LIPITOR) 80 MG tablet TAKE 1 TABLET BY MOUTH EVERY DAY 90 tablet 3   b complex vitamins capsule Take 1 capsule by mouth daily.     lisinopril (ZESTRIL) 10 MG tablet TAKE 1 TABLET BY MOUTH EVERY DAY 90 tablet 3   meloxicam (MOBIC) 15 MG tablet Take 15 mg by mouth as needed for pain. TAKE 0.5-1 tablet  AS NEEDED     metoprolol succinate (TOPROL-XL) 25 MG 24 hr tablet Take 2 tablets (50 mg total) by mouth every morning AND 0.5 tablets (12.5 mg total) every evening. 225 tablet 3   rivastigmine (EXELON) 3 MG capsule Take 1 capsule (3 mg total) by mouth 2 (two) times daily. 180 capsule 1   tamsulosin (FLOMAX) 0.4 MG CAPS capsule Take 1 capsule (0.4 mg total) by mouth daily. 90 capsule 3   VITAMIN D PO Take 1 tablet by mouth daily in the afternoon.     zolpidem (AMBIEN) 5 MG tablet TAKE 1 TABLET BY MOUTH AT BEDTIME AS NEEDED FOR SLEEP. 30 tablet 0   No facility-administered medications prior to visit.    PAST MEDICAL HISTORY: Past Medical History:  Diagnosis Date   Abdominal pain 04/01/2013   Acute bronchitis 03/07/2018   Allergic rhinitis 05/12/2008   Aortic atherosclerosis 08/23/2020   Arthritis    Ascending aortic aneurysm 12/13/2018   Atrial fibrillation with RVR 10/21/2012   Echo- EF 50-55%; mild concentric LVH; flow pattern suggestive of impaired LV relaxation; mild mitral valve prolapse, trace mitral regurgitation   Back pain    receiving PT   Benign prostatic hyperplasia 09/05/2012   Boil of buttock 06/24/2011   Bradycardia 04/17/2013   Cataract    Cellulitis of knee, left 04/12/2016   Chest pain with exertion presumed to be tachycardia related along with SOB 04/01/2013   Degeneration of lumbar intervertebral disc 12/10/2020   Eustachian tube dysfunction, bilateral 03/07/2018   External otitis of left ear 06/11/2020   Fatigue 07/04/2011   GERD (gastroesophageal reflux disease) 03/24/2009   Heart murmur    Hematuria,  possible 04/01/2013   Hereditary and idiopathic peripheral neuropathy 05/12/2008   HLD (hyperlipidemia) 08/18/2007   HTN (hypertension) 12/04/2013   Insomnia 06/08/2016   Left knee pain 12/13/2018   Low back pain 12/01/2020   Lumbar spondylosis 05/05/2021   Mild cognitive impairment of uncertain or unknown etiology 12/09/2022   Mitral valve prolapse 07/04/2011   MRSA (methicillin resistant staph aureus) culture positive 5+ years ago   Neck pain 05/05/2021   OSA (obstructive sleep apnea) 07/04/2011   Using CPAP nightly   Pain in right hand 02/04/2020   Palpitations 03/06/2007   R/P MV - mild perfusion defect in basal inferoseptal, basal inferior, mid inferoseptal, and mid inferior regions, consistent w/ infarct/scar; no scintigraphic evidence of inducible myocardial ischemia; prior non transmural infarct cannot be completely excluded; EF 48%; no significant change from  previous study   Personal history of colonic polyps 05/12/2008   Radicular pain in left arm 05/05/2021   Sacroiliitis 05/05/2021   Stricture and stenosis of esophagus 03/24/2009   TIA (transient ischemic attack) 11/22/2021   Tinnitus 09/05/2012   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   Vitamin B12 deficiency 06/15/2019   Vitamin D deficiency 05/23/2022    PAST SURGICAL HISTORY: Past Surgical History:  Procedure Laterality Date   ATRIAL FIBRILLATION ABLATION     CARDIOVERSION N/A 04/03/2013   Procedure: CARDIOVERSION;  Surgeon: Pixie Casino, MD;  Location: [facility];  Service: Cardiovascular;  Laterality: N/A;   COLONOSCOPY  2018   HAND SURGERY     Thumb joint repair   POLYPECTOMY     TEE WITHOUT CARDIOVERSION N/A 04/03/2013   Procedure: TRANSESOPHAGEAL ECHOCARDIOGRAM (TEE);  Surgeon: Pixie Casino, MD;  Location[facility] ENDOSCOPY;  Service: Cardiovascular;  Laterality: N/A;   TONSILLECTOMY AND ADENOIDECTOMY     UPPER GASTROINTESTINAL ENDOSCOPY     dilation    FAMILY HISTORY: Family History  Problem Relation  Age of Onset   Dementia Mother        late 22s   Alzheimer's disease Mother    Heart disease Father 72       died with MI   Melanoma Sister    Breast cancer Maternal Grandmother    Stroke Maternal Grandfather    Colon cancer Neg Hx    Esophageal cancer Neg Hx    Stomach cancer Neg Hx    Rectal cancer Neg Hx    Colon polyps Neg Hx     SOCIAL HISTORY: Social History   Socioeconomic History   Marital status: Married    Spouse name: Not on file   Number of children: Not on file   Years of education: 16   Highest education level: Bachelor's degree (e.g., BA, AB, BS)  Occupational History   Occupation: Retail    Comment: Retail; semi-retired Tourist information centre manager, former self employed Designer, 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  Tobacco Use   Smoking status: Never   Smokeless tobacco: Never  Vaping Use   Vaping Use: Never used  Substance and Sexual Activity   Alcohol use: Not Currently    Alcohol/week: 0.0 - 1.0 standard drinks of alcohol    Comment: infrequent glass of wine   Drug use: No   Sexual activity: Not on file  Other Topics Concern   Not on file  Social History Narrative   Not on file   Social Determinants of Health   Financial Resource Strain: Not on file  Food Insecurity: Not on file  Transportation Needs: Not on file  Physical Activity: Not on file  Stress: Not on file  Social Connections: Not on file  Intimate Partner Violence: Not on file     PHYSICAL EXAM  GENERAL EXAM/CONSTITUTIONAL: Vitals:  Vitals:   01/16/23 1349  BP: 136/85  Pulse: 61  Weight: 232 lb 8 oz (105.5 kg)  Height: 6' 4"$  (1.93 m)    Body mass index is 28.3 kg/m. Wt Readings from Last 3 Encounters:  01/16/23 232 lb 8 oz (105.5 kg)  12/12/22 227 lb (103 kg)  11/21/22 224 lb (101.6 kg)   Patient is in no distress; well developed, nourished and groomed; neck is supple   EYES: Visual fields full to confrontation, Extraocular movements intacts,   MUSCULOSKELETAL: Gait, strength, tone, movements noted in  Neurologic exam below  NEUROLOGIC: MENTAL STATUS:      No data to display  awake, alert, oriented to person, place and time recent and remote memory intact normal attention and concentration language fluent, comprehension intact, naming intact fund of knowledge appropriate     03/07/2022   10:21 AM  Montreal Cognitive Assessment   Visuospatial/ Executive (0/5) 4  Naming (0/3) 2  Attention: Read list of digits (0/2) 2  Attention: Read list of letters (0/1) 1  Attention: Serial 7 subtraction starting at 100 (0/3) 2  Language: Repeat phrase (0/2) 1  Language : Fluency (0/1) 1  Abstraction (0/2) 2  Delayed Recall (0/5) 0  Orientation (0/6) 6  Total 21  Adjusted Score (based on education) 21    CRANIAL NERVE:  2nd, 3rd, 4th, 6th - visual fields full to confrontation, extraocular muscles intact, no nystagmus 5th - facial sensation symmetric 7th - facial strength symmetric 8th - hearing intact 9th - palate elevates symmetrically, uvula midline 11th - shoulder shrug symmetric 12th - tongue protrusion midline  MOTOR:  normal bulk and tone, full strength in the BUE, BLE  SENSORY:  normal and symmetric to light touch  COORDINATION:  finger-nose-finger, fine finger movements normal  REFLEXES:  deep tendon reflexes present and symmetric  GAIT/STATION:  normal   DIAGNOSTIC DATA (LABS, IMAGING, TESTING) - I reviewed patient records, labs, notes, testing and imaging myself where available.  Lab Results  Component Value Date   WBC 5.4 11/21/2022   HGB 15.6 11/21/2022   HCT 45.6 11/21/2022   MCV 91.2 11/21/2022   PLT 213.0 11/21/2022      Component Value Date/Time   NA 142 11/21/2022 1034   NA 140 09/18/2020 1038   K 4.0 11/21/2022 1034   CL 107 11/21/2022 1034   CO2 27 11/21/2022 1034   GLUCOSE 102 (H) 11/21/2022 1034   BUN 15 11/21/2022 1034   BUN 12 09/18/2020 1038   CREATININE 1.11 11/21/2022 1034   CREATININE 1.13 03/01/2017 0001   CALCIUM 9.5  11/21/2022 1034   PROT 6.8 11/21/2022 1034   PROT 6.7 09/18/2020 1038   ALBUMIN 4.2 11/21/2022 1034   ALBUMIN 4.3 09/18/2020 1038   AST 27 11/21/2022 1034   ALT 31 11/21/2022 1034   ALKPHOS 64 11/21/2022 1034   BILITOT 0.8 11/21/2022 1034   BILITOT 0.6 09/18/2020 1038   GFRNONAA 65 09/18/2020 1038   GFRAA 75 09/18/2020 1038   Lab Results  Component Value Date   CHOL 102 11/21/2022   HDL 49.10 11/21/2022   LDLCALC 43 11/21/2022   TRIG 48.0 11/21/2022   CHOLHDL 2 11/21/2022   Lab Results  Component Value Date   HGBA1C 5.7 11/21/2022   Lab Results  Component Value Date   [id number] 09/05/2022   Lab Results  Component Value Date   TSH 2.68 11/21/2022    MRI Brain wo contrast 12/29/2021 1. No acute intracranial pathology.  No evidence of recent infarct. 2. Mild parenchymal volume loss and chronic white matter microangiopathy   ASSESSMENT AND PLAN  77 y.o. year old male with atrial fibrillation status post ablation, hypertension, hyperlipidemia who is presenting for follow up for memory deficit, that he describes as trouble with recall. He followed up with Dr. Melvyn Novas and was diagnosed with Mild cognitive impairment. He is on Exelon 3 mg twice daily. At the moment, I will obtain ATN profile and will recommend to continue current medications.  Discussed ways to reduce the progression of the disease including exercise, keeping a good sleep good diet and overall good health.  We also discussed ways to  increase his physical activity and mental activity.   For his neuropathy he did try pregabalin and reported it was not helpful, we will try him on duloxetine.  I will see him in 1 year for follow-up or sooner if worse.      1. Mild cognitive impairment of uncertain or unknown etiology      Patient Instructions  ATN profile  Continue with Exelon 3 mg gram twice daily Continue your other medications Follow-up in 1 year or sooner if worse.  Orders Placed This Encounter   Procedures   ATN PROFILE    Meds ordered this encounter  Medications   DULoxetine (CYMBALTA) 30 MG capsule    Sig: Take 1 capsule (30 mg total) by mouth daily.    Dispense:  30 capsule    Refill:  6    Return in about 1 year (around 01/17/2024).   I have spent a total of 40 minutes dedicated to this patient today, preparing to see patient, performing a medically appropriate examination and evaluation, ordering tests and/or medications and procedures, and counseling and educating the patient/family/caregiver; independently interpreting result and communicating results to the family/patient/caregiver; and documenting clinical information in the electronic medical record.    Alric Ran, MD 01/16/2023, 2:22 PM  [facility] [address] [place], [place] [address] [phone]

## 2023-01-16 NOTE — Patient Instructions (Signed)
ATN profile  Continue with Exelon 3 mg gram twice daily Continue your other medications Follow-up in 1 year or sooner if worse.

## 2023-01-19 DIAGNOSIS — D2261 Melanocytic nevi of right upper limb, including shoulder: Secondary | ICD-10-CM | POA: Diagnosis not present

## 2023-01-19 DIAGNOSIS — Z85828 Personal history of other malignant neoplasm of skin: Secondary | ICD-10-CM | POA: Diagnosis not present

## 2023-01-19 DIAGNOSIS — L570 Actinic keratosis: Secondary | ICD-10-CM | POA: Diagnosis not present

## 2023-01-19 DIAGNOSIS — D2262 Melanocytic nevi of left upper limb, including shoulder: Secondary | ICD-10-CM | POA: Diagnosis not present

## 2023-01-19 DIAGNOSIS — L821 Other seborrheic keratosis: Secondary | ICD-10-CM | POA: Diagnosis not present

## 2023-01-19 DIAGNOSIS — D225 Melanocytic nevi of trunk: Secondary | ICD-10-CM | POA: Diagnosis not present

## 2023-01-19 DIAGNOSIS — L738 Other specified follicular disorders: Secondary | ICD-10-CM | POA: Diagnosis not present

## 2023-01-19 LAB — ATN PROFILE
A -- Beta-amyloid 42/40 Ratio: 0.09 — ABNORMAL LOW (ref >0.102)
Beta-amyloid 40: 200.1 pg/mL
Beta-amyloid 42: 18 pg/mL
N -- NfL, Plasma: 4.53 pg/mL (ref 0.00–7.64)
T -- p-tau181: 4.27 pg/mL — ABNORMAL HIGH (ref 0.00–0.97)

## 2023-01-24 ENCOUNTER — Encounter: Payer: Self-pay | Admitting: Neurology

## 2023-01-24 DIAGNOSIS — G4733 Obstructive sleep apnea (adult) (pediatric): Secondary | ICD-10-CM | POA: Diagnosis not present

## 2023-01-25 ENCOUNTER — Encounter: Payer: Self-pay | Admitting: Neurology

## 2023-01-26 ENCOUNTER — Encounter: Payer: Self-pay | Admitting: Neurology

## 2023-01-26 NOTE — Telephone Encounter (Signed)
error 

## 2023-01-26 NOTE — Telephone Encounter (Signed)
Dr. Jabier Mutton next available was on 05/03/23. Schedule pt appt on 05/03/23 and put on the waitlist.  Suggested to pt to send a MyChart asking nurse if she had an early appt she could assign.

## 2023-01-26 NOTE — Telephone Encounter (Signed)
You can add him on the cancellation list for follow up

## 2023-01-29 DIAGNOSIS — G4733 Obstructive sleep apnea (adult) (pediatric): Secondary | ICD-10-CM | POA: Diagnosis not present

## 2023-01-30 NOTE — Telephone Encounter (Signed)
Thanks

## 2023-01-30 NOTE — Telephone Encounter (Signed)
Called and scheduled patient for 845 on 02.27.24

## 2023-01-30 NOTE — Telephone Encounter (Signed)
I have a 845  and a 915 opening tomorrow, please add him.  Thanks

## 2023-01-31 ENCOUNTER — Ambulatory Visit: Payer: Medicare HMO | Admitting: Neurology

## 2023-01-31 ENCOUNTER — Encounter: Payer: Self-pay | Admitting: Neurology

## 2023-01-31 VITALS — BP 128/86 | HR 56 | Ht 76.0 in | Wt 228.0 lb

## 2023-01-31 DIAGNOSIS — G3184 Mild cognitive impairment, so stated: Secondary | ICD-10-CM | POA: Diagnosis not present

## 2023-01-31 NOTE — Progress Notes (Signed)
[facility]  PATIENT: Dustin Berry. DOB: 07/19/1946  REQUESTING CLINICIAN: Biagio Borg, MD HISTORY FROM: Patient and Spouse  REASON FOR VISIT: Memory problem    HISTORICAL  CHIEF COMPLAINT:  Chief Complaint  Patient presents with   Follow-up    Rm 12. Accompanied by wife. Patient would like to discuss trail.     INTERVAL HISTORY 01/31/2023:  Patient presents today for follow-up, he is accompanied by wife.  He did have some questions regarding a clinical trial in [place] Personal assistant and Feasibility of [employer] Disruption for Mild Cognitive Impairment or Mild Alzheimer's Disease Undergoing Standard of Care Monoclonal Antibody (mAb) Therapy).   They are requesting him to have a amyloid PET scan or a CSF amyloid test.  He is doing well with the Exelon so far.  No new symptoms.   INTERVAL HISTORY 01/16/2023:  Patient presents today for follow-up, last visit was in July.  At that time, he followed up with Dr. Melvyn Novas neuropsych, had a full test completed and he was diagnosed with mild cognitive impairment.  He reports that his symptoms are still the same.  They are not getting worse.  Currently he is on Exelon 3 mg twice daily.  For his neuropathic pain, he did try pregabalin but did not see any relief therefore he discontinued.  No other complaints.   INTERVAL HISTORY 06/15/2022:  Patient presents today for follow-up, he presents alone.  At last visit due to his bradycardia we defer starting Aricept or any medications until completion of the neuropsych testing.  He reported after going home and reading about the medication, he would like to start it now.  I did inform him that his bradycardia is a relative contraindication to Aricept but we can start rivastigmine. He is comfortable with plan. His neuropsych testing is scheduled for January.  He also mentioned that since starting the pregabalin, now when walking he will have cramp-like pain in  bilateral calf and his nighttime pain is not well controlled.    INTERVAL HISTORY 03/07/2022:  Patient presents today for follow-up, at last visit plan was to start pregabalin for his neuropathic pain.  He reported the pain improved.  Today he is concerned for his memory.  He reported he has issue with recall, his long-term memory and short-term memory are intact.  Wife thinks he does not do all the tasks he supposed to do, he does forget a lot.  There was time at home where he forget and left the stove on, burning some pots.  He still drives, does not have any recent accident or being lost in family or places.  He reported mother had a history of Alzheimer's disease, diagnosed in the 45s.  He still independent, able to for perform all ADLs and a IADLs.  Denies any word finding difficulty denies forgetting names of family members.  Wife took over the finances a few years ago because he did forget to pay bills on time.   HISTORY OF PRESENT ILLNESS:  This is a 77 year old gentleman with past medical history of hypertension hyperlipidemia, previously atrial fibrillation status post ablation who was referred by PMD for TIA work up.  Patient reports 6 weeks ago while at a computer desk he noted  binocular double vision, on closing 1 eye his vision will be back to normal but with both eyes he will have double vision.  The entire episode lasted about 3 minutes then resolved.  There were no associated symptoms  with the blurry vision, denies any headaches, denies any numbness, no weakness and no slurred speech.  He never experienced an episode like that in the past and has not had any additional episodes.  He did follow-up with his primary care doctor who obtained a stroke labs and refer him to neurology for TIA work-up.  His lipid panel was within normal limits and a hemoglobin A1c was 5.7.  He denies any previous history of strokes.     OTHER MEDICAL CONDITIONS: HLD, HTN, CAD   REVIEW OF SYSTEMS: Full 14 system  review of systems performed and negative with exception of: as noted in the HPI   ALLERGIES: Allergies  Allergen Reactions   Penicillin G Rash   Quinolones Other (See Comments)    Other reaction(s): Other (See Comments) Fluroquinolone antibiotics should be avoided in patients with history of aortic aneurysm/dissection   Amoxil [Amoxicillin] Rash    HOME MEDICATIONS: Outpatient Medications Prior to Visit  Medication Sig Dispense Refill   aspirin 81 MG EC tablet Take 1 tablet (81 mg total) by mouth daily. Swallow whole. 30 tablet 12   atorvastatin (LIPITOR) 80 MG tablet TAKE 1 TABLET BY MOUTH EVERY DAY 90 tablet 3   b complex vitamins capsule Take 1 capsule by mouth daily.     DULoxetine (CYMBALTA) 30 MG capsule Take 1 capsule (30 mg total) by mouth daily. 30 capsule 6   lisinopril (ZESTRIL) 10 MG tablet TAKE 1 TABLET BY MOUTH EVERY DAY 90 tablet 3   meloxicam (MOBIC) 15 MG tablet Take 15 mg by mouth as needed for pain. TAKE 0.5-1 tablet  AS NEEDED     metoprolol succinate (TOPROL-XL) 25 MG 24 hr tablet Take 2 tablets (50 mg total) by mouth every morning AND 0.5 tablets (12.5 mg total) every evening. 225 tablet 3   rivastigmine (EXELON) 3 MG capsule Take 1 capsule (3 mg total) by mouth 2 (two) times daily. 180 capsule 1   tamsulosin (FLOMAX) 0.4 MG CAPS capsule Take 1 capsule (0.4 mg total) by mouth daily. 90 capsule 3   VITAMIN D PO Take 1 tablet by mouth daily in the afternoon.     zolpidem (AMBIEN) 5 MG tablet TAKE 1 TABLET BY MOUTH AT BEDTIME AS NEEDED FOR SLEEP. 30 tablet 0   No facility-administered medications prior to visit.    PAST MEDICAL HISTORY: Past Medical History:  Diagnosis Date   Abdominal pain 04/01/2013   Acute bronchitis 03/07/2018   Allergic rhinitis 05/12/2008   Aortic atherosclerosis 08/23/2020   Arthritis    Ascending aortic aneurysm 12/13/2018   Atrial fibrillation with RVR 10/21/2012   Echo- EF 50-55%; mild concentric LVH; flow pattern suggestive of  impaired LV relaxation; mild mitral valve prolapse, trace mitral regurgitation   Back pain    receiving PT   Benign prostatic hyperplasia 09/05/2012   Boil of buttock 06/24/2011   Bradycardia 04/17/2013   Cataract    Cellulitis of knee, left 04/12/2016   Chest pain with exertion presumed to be tachycardia related along with SOB 04/01/2013   Degeneration of lumbar intervertebral disc 12/10/2020   Eustachian tube dysfunction, bilateral 03/07/2018   External otitis of left ear 06/11/2020   Fatigue 07/04/2011   GERD (gastroesophageal reflux disease) 03/24/2009   Heart murmur    Hematuria, possible 04/01/2013   Hereditary and idiopathic peripheral neuropathy 05/12/2008   HLD (hyperlipidemia) 08/18/2007   HTN (hypertension) 12/04/2013   Insomnia 06/08/2016   Left knee pain 12/13/2018   Low back pain 12/01/2020  Lumbar spondylosis 05/05/2021   Mild cognitive impairment of uncertain or unknown etiology 12/09/2022   Mitral valve prolapse 07/04/2011   MRSA (methicillin resistant staph aureus) culture positive 5+ years ago   Neck pain 05/05/2021   OSA (obstructive sleep apnea) 07/04/2011   Using CPAP nightly   Pain in right hand 02/04/2020   Palpitations 03/06/2007   R/P MV - mild perfusion defect in basal inferoseptal, basal inferior, mid inferoseptal, and mid inferior regions, consistent w/ infarct/scar; no scintigraphic evidence of inducible myocardial ischemia; prior non transmural infarct cannot be completely excluded; EF 48%; no significant change from previous study   Personal history of colonic polyps 05/12/2008   Radicular pain in left arm 05/05/2021   Sacroiliitis 05/05/2021   Stricture and stenosis of esophagus 03/24/2009   TIA (transient ischemic attack) 11/22/2021   Tinnitus 09/05/2012   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   Vitamin B12 deficiency 06/15/2019   Vitamin D deficiency 05/23/2022    PAST SURGICAL HISTORY: Past Surgical History:  Procedure Laterality Date    ATRIAL FIBRILLATION ABLATION     CARDIOVERSION N/A 04/03/2013   Procedure: CARDIOVERSION;  Surgeon: Pixie Casino, MD;  Location: [facility] ENDOSCOPY;  Service: Cardiovascular;  Laterality: N/A;   COLONOSCOPY  2018   HAND SURGERY     Thumb joint repair   POLYPECTOMY     TEE WITHOUT CARDIOVERSION N/A 04/03/2013   Procedure: TRANSESOPHAGEAL ECHOCARDIOGRAM (TEE);  Surgeon: Pixie Casino, MD;  Location: [facility] ENDOSCOPY;  Service: Cardiovascular;  Laterality: N/A;   TONSILLECTOMY AND ADENOIDECTOMY     UPPER GASTROINTESTINAL ENDOSCOPY     dilation    FAMILY HISTORY: Family History  Problem Relation Age of Onset   Dementia Mother        late 50s   Alzheimer's disease Mother    Heart disease Father 21       died with MI   Melanoma Sister    Breast cancer Maternal Grandmother    Stroke Maternal Grandfather    Colon cancer Neg Hx    Esophageal cancer Neg Hx    Stomach cancer Neg Hx    Rectal cancer Neg Hx    Colon polyps Neg Hx     SOCIAL HISTORY: Social History   Socioeconomic History   Marital status: Married    Spouse name: Not on file   Number of children: Not on file   Years of education: 16   Highest education level: Bachelor's degree (e.g., BA, AB, BS)  Occupational History   Occupation: Retail    Comment: Retail; semi-retired Tourist information centre manager, former self employed Designer, 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  Tobacco Use   Smoking status: Never   Smokeless tobacco: Never  Vaping Use   Vaping Use: Never used  Substance and Sexual Activity   Alcohol use: Not Currently    Alcohol/week: 0.0 - 1.0 standard drinks of alcohol    Comment: infrequent glass of wine   Drug use: No   Sexual activity: Not on file  Other Topics Concern   Not on file  Social History Narrative   Not on file   Social Determinants of Health   Financial Resource Strain: Not on file  Food Insecurity: Not on file  Transportation Needs: Not on file  Physical Activity: Not on file  Stress: Not on file  Social Connections: Not on  file  Intimate Partner Violence: Not on file     PHYSICAL EXAM  GENERAL EXAM/CONSTITUTIONAL: Vitals:  Vitals:   01/31/23 0827  BP: 128/86  Pulse: (!) 56  Weight: 228 lb (103.4 kg)  Height: '6\' 4"'$  (1.93 m)     Body mass index is 27.75 kg/m. Wt Readings from Last 3 Encounters:  01/31/23 228 lb (103.4 kg)  01/16/23 232 lb 8 oz (105.5 kg)  12/12/22 227 lb (103 kg)   Patient is in no distress; well developed, nourished and groomed; neck is supple   EYES: Visual fields full to confrontation, Extraocular movements intacts,   MUSCULOSKELETAL: Gait, strength, tone, movements noted in Neurologic exam below  NEUROLOGIC: MENTAL STATUS:      No data to display         awake, alert, oriented to person, place and time recent and remote memory intact normal attention and concentration language fluent, comprehension intact, naming intact fund of knowledge appropriate     03/07/2022   10:21 AM  Montreal Cognitive Assessment   Visuospatial/ Executive (0/5) 4  Naming (0/3) 2  Attention: Read list of digits (0/2) 2  Attention: Read list of letters (0/1) 1  Attention: Serial 7 subtraction starting at 100 (0/3) 2  Language: Repeat phrase (0/2) 1  Language : Fluency (0/1) 1  Abstraction (0/2) 2  Delayed Recall (0/5) 0  Orientation (0/6) 6  Total 21  Adjusted Score (based on education) 21    CRANIAL NERVE:  2nd, 3rd, 4th, 6th - visual fields full to confrontation, extraocular muscles intact, no nystagmus 5th - facial sensation symmetric 7th - facial strength symmetric 8th - hearing intact 9th - palate elevates symmetrically, uvula midline 11th - shoulder shrug symmetric 12th - tongue protrusion midline  MOTOR:  normal bulk and tone, full strength in the BUE, BLE  SENSORY:  normal and symmetric to light touch  COORDINATION:  finger-nose-finger, fine finger movements normal  REFLEXES:  deep tendon reflexes present and symmetric  GAIT/STATION:   normal   DIAGNOSTIC DATA (LABS, IMAGING, TESTING) - I reviewed patient records, labs, notes, testing and imaging myself where available.  Lab Results  Component Value Date   WBC 5.4 11/21/2022   HGB 15.6 11/21/2022   HCT 45.6 11/21/2022   MCV 91.2 11/21/2022   PLT 213.0 11/21/2022      Component Value Date/Time   NA 142 11/21/2022 1034   NA 140 09/18/2020 1038   K 4.0 11/21/2022 1034   CL 107 11/21/2022 1034   CO2 27 11/21/2022 1034   GLUCOSE 102 (H) 11/21/2022 1034   BUN 15 11/21/2022 1034   BUN 12 09/18/2020 1038   CREATININE 1.11 11/21/2022 1034   CREATININE 1.13 03/01/2017 0001   CALCIUM 9.5 11/21/2022 1034   PROT 6.8 11/21/2022 1034   PROT 6.7 09/18/2020 1038   ALBUMIN 4.2 11/21/2022 1034   ALBUMIN 4.3 09/18/2020 1038   AST 27 11/21/2022 1034   ALT 31 11/21/2022 1034   ALKPHOS 64 11/21/2022 1034   BILITOT 0.8 11/21/2022 1034   BILITOT 0.6 09/18/2020 1038   GFRNONAA 65 09/18/2020 1038   GFRAA 75 09/18/2020 1038   Lab Results  Component Value Date   CHOL 102 11/21/2022   HDL 49.10 11/21/2022   LDLCALC 43 11/21/2022   TRIG 48.0 11/21/2022   CHOLHDL 2 11/21/2022   Lab Results  Component Value Date   HGBA1C 5.7 11/21/2022   Lab Results  Component Value Date   [id number] 09/05/2022   Lab Results  Component Value Date   TSH 2.68 11/21/2022    MRI Brain wo contrast 12/29/2021 1. No acute intracranial pathology.  No evidence of recent infarct. 2. Mild parenchymal  volume loss and chronic white matter microangiopathy   ASSESSMENT AND PLAN  77 y.o. year old male with atrial fibrillation status post ablation, hypertension, hyperlipidemia, mild cognitive impairment possibly due to AD who is presenting for follow up.  His ATN profile was postive for AD biomarker's. He did have some questions regarding a clinical trial, early phase 1, safety and feasibility of Exablate blood-brain barrier disruption for mild cognitive impairment or mild Alzheimer disease  undergoing standard of care monoclonal antibody therapy.  We discussed in great details about the meaning of this trial, that is an early phase 1 and this is a safety and feasibility trial. He is in the process of changing insurance but if this is done plan will be to move forward with getting the PET scan and then patient will decide if he wants to get treatment with Leqembi or do some other clinical trial.  I will see them as scheduled.     1. Mild cognitive impairment       Patient Instructions  Continue current medications  Contact us if you were able to switch insurance  Follow up as scheduled  No orders of the defined types were placed in this encounter.   No orders of the defined types were placed in this encounter.   No follow-ups on file.   I have spent a total of 50 minutes dedicated to this patient today, preparing to see patient, performing a medically appropriate examination and evaluation, discussing in details his diagnosis and the clinical trial he wants to get enrol. We also discussed about insurance and PET amyloid and Leqembi, and documenting clinical information in the electronic medical record.    Alric Ran, MD 01/31/2023, 9:20 AM  [facility] [address] [place], [place] [address] [phone]

## 2023-01-31 NOTE — Patient Instructions (Signed)
Continue current medications  Contact us if you were able to switch insurance  Follow up as scheduled

## 2023-02-08 ENCOUNTER — Other Ambulatory Visit: Payer: Self-pay | Admitting: Neurology

## 2023-02-13 ENCOUNTER — Other Ambulatory Visit: Payer: Self-pay | Admitting: Neurology

## 2023-02-13 ENCOUNTER — Encounter: Payer: Self-pay | Admitting: Neurology

## 2023-02-13 MED ORDER — RIVASTIGMINE TARTRATE 3 MG PO CAPS: 3.0000 mg | ORAL_CAPSULE | Freq: Two times a day (BID) | ORAL | 1 refills | Status: DC

## 2023-02-27 DIAGNOSIS — G4733 Obstructive sleep apnea (adult) (pediatric): Secondary | ICD-10-CM | POA: Diagnosis not present

## 2023-03-09 ENCOUNTER — Encounter: Payer: Self-pay | Admitting: Cardiovascular Disease

## 2023-03-09 ENCOUNTER — Encounter: Payer: Self-pay | Admitting: Neurology

## 2023-03-13 ENCOUNTER — Other Ambulatory Visit: Payer: Self-pay | Admitting: Neurology

## 2023-03-13 DIAGNOSIS — G3184 Mild cognitive impairment, so stated: Secondary | ICD-10-CM

## 2023-03-13 DIAGNOSIS — G309 Alzheimer's disease, unspecified: Secondary | ICD-10-CM

## 2023-03-16 ENCOUNTER — Telehealth: Payer: Self-pay | Admitting: Neurology

## 2023-03-16 NOTE — Telephone Encounter (Signed)
No insurance-sent to [facility] [phone]

## 2023-04-06 ENCOUNTER — Encounter (HOSPITAL_COMMUNITY)
Admission: RE | Admit: 2023-04-06 | Discharge: 2023-04-06 | Disposition: A | Discharge status: Home / Self Care | Payer: Medicare Other | Source: Ambulatory Visit | Attending: Neurology | Admitting: Neurology

## 2023-04-06 DIAGNOSIS — G3184 Mild cognitive impairment, so stated: Secondary | ICD-10-CM

## 2023-04-06 DIAGNOSIS — G309 Alzheimer's disease, unspecified: Secondary | ICD-10-CM | POA: Diagnosis present

## 2023-04-06 MED ORDER — FLORBETAPIR F 18 500-1900 MBQ/ML IV SOLN
9.1000 | Freq: Once | INTRAVENOUS | Status: AC
  Administered 2023-04-06: 9.1 via INTRAVENOUS

## 2023-04-24 ENCOUNTER — Encounter: Payer: Self-pay | Admitting: Pulmonary Disease

## 2023-04-27 NOTE — Telephone Encounter (Signed)
Dr. Wynona Neat please advise on the following My Chart message:   Dustin Berry. "Dustin Berry"  P [facility] Pool (supporting Adewale Renne Musca, MD)3 days ago    The [place] focused ultrasound trial is now full for this phase. So, I'm not able to get in. Therefore, I would like to take you up on starting a Leqembi regiment.  I have an appointment with you next Wednesday 5/29, good timing.  How long would it be before I could start the sessions?  Thanks for your consideration.    Dustin Berry   Thank you

## 2023-04-28 ENCOUNTER — Encounter: Payer: Self-pay | Admitting: Pulmonary Disease

## 2023-05-03 ENCOUNTER — Encounter: Payer: Self-pay | Admitting: Neurology

## 2023-05-03 ENCOUNTER — Ambulatory Visit (INDEPENDENT_AMBULATORY_CARE_PROVIDER_SITE_OTHER): Payer: Medicare Other | Admitting: Neurology

## 2023-05-03 VITALS — BP 125/77 | HR 55 | Ht 76.0 in | Wt 229.0 lb

## 2023-05-03 DIAGNOSIS — G3184 Mild cognitive impairment, so stated: Secondary | ICD-10-CM | POA: Diagnosis not present

## 2023-05-03 DIAGNOSIS — G301 Alzheimer's disease with late onset: Secondary | ICD-10-CM | POA: Diagnosis not present

## 2023-05-03 DIAGNOSIS — F02A0 Dementia in other diseases classified elsewhere, mild, without behavioral disturbance, psychotic disturbance, mood disturbance, and anxiety: Secondary | ICD-10-CM

## 2023-05-03 NOTE — Progress Notes (Unsigned)
[facility]  PATIENT: Dustin Berry. DOB: Apr 13, 1946  REQUESTING CLINICIAN: Corwin Levins, MD HISTORY FROM: Patient and Spouse  REASON FOR VISIT: Memory problem    HISTORICAL  CHIEF COMPLAINT:  Chief Complaint  Patient presents with   Follow-up    Rm 13. Patient alone. Feels memory is okay and doesn't feel it has gotten any worse.    INTERVAL HISTORY 05/03/2023:  Patient presents today for follow-up, last visit was in February.  Since then he reports that he has been stable.  He did follow-up with the [facility] [place] but was not included in the clinical trial.  He is still interested in starting [place].  No other complaints or concerns.   INTERVAL HISTORY 01/31/2023:  Patient presents today for follow-up, he is accompanied by wife.  He did have some questions regarding a clinical trial in [place] Clinical biochemist and Feasibility of [employer] Disruption for Mild Cognitive Impairment or Mild Alzheimer's Disease Undergoing Standard of Care Monoclonal Antibody (mAb) Therapy).   They are requesting him to have a amyloid PET scan or a CSF amyloid test.  He is doing well with the Exelon so far.  No new symptoms.   INTERVAL HISTORY 01/16/2023:  Patient presents today for follow-up, last visit was in July.  At that time, he followed up with Dr. Milbert Coulter neuropsych, had a full test completed and he was diagnosed with mild cognitive impairment.  He reports that his symptoms are still the same.  They are not getting worse.  Currently he is on Exelon 3 mg twice daily.  For his neuropathic pain, he did try pregabalin but did not see any relief therefore he discontinued.  No other complaints.   INTERVAL HISTORY 06/15/2022:  Patient presents today for follow-up, he presents alone.  At last visit due to his bradycardia we defer starting Aricept or any medications until completion of the neuropsych testing.  He reported after going home and reading  about the medication, he would like to start it now.  I did inform him that his bradycardia is a relative contraindication to Aricept but we can start rivastigmine. He is comfortable with plan. His neuropsych testing is scheduled for January.  He also mentioned that since starting the pregabalin, now when walking he will have cramp-like pain in bilateral calf and his nighttime pain is not well controlled.    INTERVAL HISTORY 03/07/2022:  Patient presents today for follow-up, at last visit plan was to start pregabalin for his neuropathic pain.  He reported the pain improved.  Today he is concerned for his memory.  He reported he has issue with recall, his long-term memory and short-term memory are intact.  Wife thinks he does not do all the tasks he supposed to do, he does forget a lot.  There was time at home where he forget and left the stove on, burning some pots.  He still drives, does not have any recent accident or being lost in family or places.  He reported mother had a history of Alzheimer's disease, diagnosed in the 16s.  He still independent, able to for perform all ADLs and a IADLs.  Denies any word finding difficulty denies forgetting names of family members.  Wife took over the finances a few years ago because he did forget to pay bills on time.   HISTORY OF PRESENT ILLNESS:  This is a 77 year old gentleman with past medical history of hypertension hyperlipidemia, previously atrial fibrillation status post ablation  who was referred by PMD for TIA work up.  Patient reports 6 weeks ago while at a computer desk he noted  binocular double vision, on closing 1 eye his vision will be back to normal but with both eyes he will have double vision.  The entire episode lasted about 3 minutes then resolved.  There were no associated symptoms with the blurry vision, denies any headaches, denies any numbness, no weakness and no slurred speech.  He never experienced an episode like that in the past and has not  had any additional episodes.  He did follow-up with his primary care doctor who obtained a stroke labs and refer him to neurology for TIA work-up.  His lipid panel was within normal limits and a hemoglobin A1c was 5.7.  He denies any previous history of strokes.     OTHER MEDICAL CONDITIONS: HLD, HTN, CAD   REVIEW OF SYSTEMS: Full 14 system review of systems performed and negative with exception of: as noted in the HPI   ALLERGIES: Allergies  Allergen Reactions   Penicillin G Rash   Quinolones Other (See Comments)    Other reaction(s): Other (See Comments) Fluroquinolone antibiotics should be avoided in patients with history of aortic aneurysm/dissection   Amoxil [Amoxicillin] Rash    HOME MEDICATIONS: Outpatient Medications Prior to Visit  Medication Sig Dispense Refill   aspirin 81 MG EC tablet Take 1 tablet (81 mg total) by mouth daily. Swallow whole. 30 tablet 12   atorvastatin (LIPITOR) 80 MG tablet TAKE 1 TABLET BY MOUTH EVERY DAY 90 tablet 3   b complex vitamins capsule Take 1 capsule by mouth daily.     lisinopril (ZESTRIL) 10 MG tablet TAKE 1 TABLET BY MOUTH EVERY DAY 90 tablet 3   meloxicam (MOBIC) 15 MG tablet Take 15 mg by mouth as needed for pain. TAKE 0.5-1 tablet  AS NEEDED     metoprolol succinate (TOPROL-XL) 25 MG 24 hr tablet Take 2 tablets (50 mg total) by mouth every morning AND 0.5 tablets (12.5 mg total) every evening. 225 tablet 3   rivastigmine (EXELON) 3 MG capsule Take 1 capsule (3 mg total) by mouth 2 (two) times daily. 180 capsule 1   tamsulosin (FLOMAX) 0.4 MG CAPS capsule Take 1 capsule (0.4 mg total) by mouth daily. 90 capsule 3   VITAMIN D PO Take 1 tablet by mouth daily in the afternoon.     zolpidem (AMBIEN) 5 MG tablet TAKE 1 TABLET BY MOUTH AT BEDTIME AS NEEDED FOR SLEEP. 30 tablet 0   No facility-administered medications prior to visit.    PAST MEDICAL HISTORY: Past Medical History:  Diagnosis Date   Abdominal pain 04/01/2013   Acute  bronchitis 03/07/2018   Allergic rhinitis 05/12/2008   Aortic atherosclerosis 08/23/2020   Arthritis    Ascending aortic aneurysm 12/13/2018   Atrial fibrillation with RVR 10/21/2012   Echo- EF 50-55%; mild concentric LVH; flow pattern suggestive of impaired LV relaxation; mild mitral valve prolapse, trace mitral regurgitation   Back pain    receiving PT   Benign prostatic hyperplasia 09/05/2012   Boil of buttock 06/24/2011   Bradycardia 04/17/2013   Cataract    Cellulitis of knee, left 04/12/2016   Chest pain with exertion presumed to be tachycardia related along with SOB 04/01/2013   Degeneration of lumbar intervertebral disc 12/10/2020   Eustachian tube dysfunction, bilateral 03/07/2018   External otitis of left ear 06/11/2020   Fatigue 07/04/2011   GERD (gastroesophageal reflux disease) 03/24/2009  Heart murmur    Hematuria, possible 04/01/2013   Hereditary and idiopathic peripheral neuropathy 05/12/2008   HLD (hyperlipidemia) 08/18/2007   HTN (hypertension) 12/04/2013   Insomnia 06/08/2016   Left knee pain 12/13/2018   Low back pain 12/01/2020   Lumbar spondylosis 05/05/2021   Mild cognitive impairment of uncertain or unknown etiology 12/09/2022   Mitral valve prolapse 07/04/2011   MRSA (methicillin resistant staph aureus) culture positive 5+ years ago   Neck pain 05/05/2021   OSA (obstructive sleep apnea) 07/04/2011   Using CPAP nightly   Pain in right hand 02/04/2020   Palpitations 03/06/2007   R/P MV - mild perfusion defect in basal inferoseptal, basal inferior, mid inferoseptal, and mid inferior regions, consistent w/ infarct/scar; no scintigraphic evidence of inducible myocardial ischemia; prior non transmural infarct cannot be completely excluded; EF 48%; no significant change from previous study   Personal history of colonic polyps 05/12/2008   Radicular pain in left arm 05/05/2021   Sacroiliitis 05/05/2021   Stricture and stenosis of esophagus 03/24/2009   TIA  (transient ischemic attack) 11/22/2021   Tinnitus 09/05/2012   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   Vitamin B12 deficiency 06/15/2019   Vitamin D deficiency 05/23/2022    PAST SURGICAL HISTORY: Past Surgical History:  Procedure Laterality Date   ATRIAL FIBRILLATION ABLATION     CARDIOVERSION N/A 04/03/2013   Procedure: CARDIOVERSION;  Surgeon: Chrystie Nose, MD;  Location: [facility] ENDOSCOPY;  Service: Cardiovascular;  Laterality: N/A;   COLONOSCOPY  2018   HAND SURGERY     Thumb joint repair   POLYPECTOMY     TEE WITHOUT CARDIOVERSION N/A 04/03/2013   Procedure: TRANSESOPHAGEAL ECHOCARDIOGRAM (TEE);  Surgeon: Chrystie Nose, MD;  Location: [facility] ENDOSCOPY;  Service: Cardiovascular;  Laterality: N/A;   TONSILLECTOMY AND ADENOIDECTOMY     UPPER GASTROINTESTINAL ENDOSCOPY     dilation    FAMILY HISTORY: Family History  Problem Relation Age of Onset   Dementia Mother        late 11s   Alzheimer's disease Mother    Heart disease Father 48       died with MI   Melanoma Sister    Breast cancer Maternal Grandmother    Stroke Maternal Grandfather    Colon cancer Neg Hx    Esophageal cancer Neg Hx    Stomach cancer Neg Hx    Rectal cancer Neg Hx    Colon polyps Neg Hx     SOCIAL HISTORY: 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  Tobacco Use   Smoking status: Never   Smokeless tobacco: Never  Vaping Use   Vaping Use: Never used  Substance and Sexual Activity   Alcohol use: Not Currently    Alcohol/week: 0.0 - 1.0 standard drinks of alcohol    Comment: infrequent glass of wine   Drug use: No   Sexual activity: Not on file  Other Topics Concern   Not on file  Social History  Narrative   Not on file   Social Determinants of Health   Financial Resource Strain: Not on file  Food Insecurity: Not on file  Transportation Needs: Not on file  Physical Activity: Not  on file  Stress: Not on file  Social Connections: Not on file  Intimate Partner Violence: Not on file     PHYSICAL EXAM  GENERAL EXAM/CONSTITUTIONAL: Vitals:  Vitals:   05/03/23 1526  BP: 125/77  Pulse: (!) 55  Weight: 229 lb (103.9 kg)  Height: 6\' 4"  (1.93 m)     Body mass index is 27.87 kg/m. Wt Readings from Last 3 Encounters:  05/03/23 229 lb (103.9 kg)  01/31/23 228 lb (103.4 kg)  01/16/23 232 lb 8 oz (105.5 kg)   Patient is in no distress; well developed, nourished and groomed; neck is supple  MUSCULOSKELETAL: Gait, strength, tone, movements noted in Neurologic exam below  NEUROLOGIC: MENTAL STATUS:      No data to display         awake, alert, oriented to person, place and time recent and remote memory intact normal attention and concentration language fluent, comprehension intact, naming intact fund of knowledge appropriate     05/03/2023    3:28 PM 03/07/2022   10:21 AM  Montreal Cognitive Assessment   Visuospatial/ Executive (0/5) 5 4  Naming (0/3) 3 2  Attention: Read list of digits (0/2) 2 2  Attention: Read list of letters (0/1) 1 1  Attention: Serial 7 subtraction starting at 100 (0/3) 3 2  Language: Repeat phrase (0/2) 2 1  Language : Fluency (0/1) 1 1  Abstraction (0/2) 2 2  Delayed Recall (0/5) 3 0  Orientation (0/6) 6 6  Total 28 21  Adjusted Score (based on education)  21    CRANIAL NERVE:  2nd, 3rd, 4th, 6th - visual fields full to confrontation, extraocular muscles intact, no nystagmus 5th - facial sensation symmetric 7th - facial strength symmetric 8th - hearing intact 9th - palate elevates symmetrically, uvula midline 11th - shoulder shrug symmetric 12th - tongue protrusion midline  MOTOR:  normal bulk and tone, full strength in the  BUE, BLE  SENSORY:  normal and symmetric to light touch  COORDINATION:  finger-nose-finger, fine finger movements normal  REFLEXES:  deep tendon reflexes present and symmetric  GAIT/STATION:  normal   DIAGNOSTIC DATA (LABS, IMAGING, TESTING) - I reviewed patient records, labs, notes, testing and imaging myself where available.  Lab Results  Component Value Date   WBC 5.4 11/21/2022   HGB 15.6 11/21/2022   HCT 45.6 11/21/2022   MCV 91.2 11/21/2022   PLT 213.0 11/21/2022      Component Value Date/Time   NA 142 11/21/2022 1034   NA 140 09/18/2020 1038   K 4.0 11/21/2022 1034   CL 107 11/21/2022 1034   CO2 27 11/21/2022 1034   GLUCOSE 102 (H) 11/21/2022 1034   BUN 15 11/21/2022 1034   BUN 12 09/18/2020 1038   CREATININE 1.11 11/21/2022 1034   CREATININE 1.13 03/01/2017 0001   CALCIUM 9.5 11/21/2022 1034   PROT 6.8 11/21/2022 1034   PROT 6.7 09/18/2020 1038   ALBUMIN 4.2 11/21/2022 1034   ALBUMIN 4.3 09/18/2020 1038   AST 27 11/21/2022 1034   ALT 31 11/21/2022 1034   ALKPHOS 64 11/21/2022 1034   BILITOT 0.8 11/21/2022 1034   BILITOT 0.6 09/18/2020 1038   GFRNONAA 65 09/18/2020 1038   GFRAA 75 09/18/2020 1038   Lab Results  Component Value Date   CHOL 102 11/21/2022   HDL 49.10 11/21/2022   LDLCALC 43 11/21/2022   TRIG 48.0 11/21/2022   CHOLHDL 2 11/21/2022   Lab Results  Component Value Date   HGBA1C  5.7 11/21/2022   Lab Results  Component Value Date   VITAMINB12 823 09/05/2022   Lab Results  Component Value Date   TSH 2.68 11/21/2022    MRI Brain wo contrast 12/29/2021 1. No acute intracranial pathology.  No evidence of recent infarct. 2. Mild parenchymal volume loss and chronic white matter microangiopathy  PET Amyloid 04/06/2023 Scan is POSITIVE for brain amyloid and is most consistent with the presence of moderate to frequent neuritic beta-amyloid plaques in the brain.   ASSESSMENT AND PLAN  77 y.o. year old male with atrial fibrillation  status post ablation, hypertension, hyperlipidemia, mild cognitive impairment due to AD who is presenting for follow up.  His ATN profile was postive for AD biomarker's.  His amyloid PET scan was also positive.  At the moment I will obtain an AOPE 4 genotype and will start the paperwork to start patient on Leqembi.  I will contact him to let him know the first day of infusion.  Continue follow-up with PCP and return sooner if worse.     1. Mild cognitive impairment of uncertain or unknown etiology   2. Mild late onset Alzheimer's dementia without behavioral disturbance, psychotic disturbance, mood disturbance, or anxiety (HCC)       Patient Instructions  APOE4 genotype Will start paperwork for Leqembi approval Continue current medications Continue to follow with PCP Return in 1 year or sooner if worse    Orders Placed This Encounter  Procedures   APOE Alzheimer's Risk    No orders of the defined types were placed in this encounter.   Return in about 1 year (around 05/02/2024).    Windell Norfolk, MD 05/04/2023, 8:14 PM  [facility] [address] [place], [place] [address] [phone]

## 2023-05-04 ENCOUNTER — Encounter: Payer: Self-pay | Admitting: Neurology

## 2023-05-04 NOTE — Patient Instructions (Signed)
APOE4 genotype Will start paperwork for Leqembi approval Continue current medications Continue to follow with PCP Return in 1 year or sooner if worse

## 2023-05-10 ENCOUNTER — Telehealth: Payer: Self-pay

## 2023-05-10 NOTE — Telephone Encounter (Signed)
Call to patient to go over Functional activities questionnaire, patient answered all questions independently, still works full time, is a Psychologist, sport and exercise, and does all cooking for household. He has a calendar he uses to put appointments down in. Patient appreciative of call.

## 2023-05-10 NOTE — Telephone Encounter (Signed)
Called and patient will be here tomorrow to get completed MOCA for med paperwork

## 2023-05-11 ENCOUNTER — Telehealth: Payer: Self-pay | Admitting: Neurology

## 2023-05-11 ENCOUNTER — Other Ambulatory Visit: Payer: Self-pay | Admitting: Neurology

## 2023-05-11 DIAGNOSIS — G3184 Mild cognitive impairment, so stated: Secondary | ICD-10-CM

## 2023-05-11 DIAGNOSIS — G301 Alzheimer's disease with late onset: Secondary | ICD-10-CM

## 2023-05-11 NOTE — Telephone Encounter (Signed)
Medicare/[employer] NPR sent to GI [phone]

## 2023-05-17 ENCOUNTER — Ambulatory Visit: Payer: Medicare Other | Attending: Physician Assistant | Admitting: Nurse Practitioner

## 2023-05-17 ENCOUNTER — Ambulatory Visit: Payer: Medicare HMO | Admitting: Physician Assistant

## 2023-05-17 ENCOUNTER — Encounter: Payer: Self-pay | Admitting: Nurse Practitioner

## 2023-05-17 VITALS — BP 96/61 | HR 74 | Ht 76.0 in | Wt 224.4 lb

## 2023-05-17 DIAGNOSIS — E785 Hyperlipidemia, unspecified: Secondary | ICD-10-CM | POA: Diagnosis present

## 2023-05-17 DIAGNOSIS — I7121 Aneurysm of the ascending aorta, without rupture: Secondary | ICD-10-CM | POA: Diagnosis not present

## 2023-05-17 DIAGNOSIS — I480 Paroxysmal atrial fibrillation: Secondary | ICD-10-CM | POA: Insufficient documentation

## 2023-05-17 DIAGNOSIS — Z8673 Personal history of transient ischemic attack (TIA), and cerebral infarction without residual deficits: Secondary | ICD-10-CM | POA: Diagnosis present

## 2023-05-17 DIAGNOSIS — F0280 Dementia in other diseases classified elsewhere without behavioral disturbance: Secondary | ICD-10-CM | POA: Insufficient documentation

## 2023-05-17 DIAGNOSIS — I10 Essential (primary) hypertension: Secondary | ICD-10-CM | POA: Insufficient documentation

## 2023-05-17 DIAGNOSIS — G309 Alzheimer's disease, unspecified: Secondary | ICD-10-CM | POA: Diagnosis present

## 2023-05-17 NOTE — Progress Notes (Signed)
Office Visit    Patient Name: Ricky Thomas. Date of Encounter: 05/17/2023  Primary Care Provider:  Corwin Levins, MD Primary Cardiologist:  Nicki Guadalajara, MD  Chief Complaint    77 year old male with a history of paroxysmal atrial fibrillation, mitral valve prolapse with mild MR, ascending aortic aneurysm, hypertension, hyperlipidemia,  TIA, and Alzheimer's dementia who presents for follow-up related to atrial fibrillation.  Past Medical History    Past Medical History:  Diagnosis Date   Abdominal pain 04/01/2013   Acute bronchitis 03/07/2018   Allergic rhinitis 05/12/2008   Aortic atherosclerosis 08/23/2020   Arthritis    Ascending aortic aneurysm 12/13/2018   Atrial fibrillation with RVR 10/21/2012   Echo- EF 50-55%; mild concentric LVH; flow pattern suggestive of impaired LV relaxation; mild mitral valve prolapse, trace mitral regurgitation   Back pain    receiving PT   Benign prostatic hyperplasia 09/05/2012   Boil of buttock 06/24/2011   Bradycardia 04/17/2013   Cataract    Cellulitis of knee, left 04/12/2016   Chest pain with exertion presumed to be tachycardia related along with SOB 04/01/2013   Degeneration of lumbar intervertebral disc 12/10/2020   Eustachian tube dysfunction, bilateral 03/07/2018   External otitis of left ear 06/11/2020   Fatigue 07/04/2011   GERD (gastroesophageal reflux disease) 03/24/2009   Heart murmur    Hematuria, possible 04/01/2013   Hereditary and idiopathic peripheral neuropathy 05/12/2008   HLD (hyperlipidemia) 08/18/2007   HTN (hypertension) 12/04/2013   Insomnia 06/08/2016   Left knee pain 12/13/2018   Low back pain 12/01/2020   Lumbar spondylosis 05/05/2021   Mild cognitive impairment of uncertain or unknown etiology 12/09/2022   Mitral valve prolapse 07/04/2011   MRSA (methicillin resistant staph aureus) culture positive 5+ years ago   Neck pain 05/05/2021   OSA (obstructive sleep apnea) 07/04/2011   Using CPAP  nightly   Pain in right hand 02/04/2020   Palpitations 03/06/2007   R/P MV - mild perfusion defect in basal inferoseptal, basal inferior, mid inferoseptal, and mid inferior regions, consistent w/ infarct/scar; no scintigraphic evidence of inducible myocardial ischemia; prior non transmural infarct cannot be completely excluded; EF 48%; no significant change from previous study   Personal history of colonic polyps 05/12/2008   Radicular pain in left arm 05/05/2021   Sacroiliitis 05/05/2021   Stricture and stenosis of esophagus 03/24/2009   TIA (transient ischemic attack) 11/22/2021   Tinnitus 09/05/2012   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   Vitamin B12 deficiency 06/15/2019   Vitamin D deficiency 05/23/2022   Past Surgical History:  Procedure Laterality Date   ATRIAL FIBRILLATION ABLATION     CARDIOVERSION N/A 04/03/2013   Procedure: CARDIOVERSION;  Surgeon: Chrystie Nose, MD;  Location: [facility] ENDOSCOPY;  Service: Cardiovascular;  Laterality: N/A;   COLONOSCOPY  2018   HAND SURGERY     Thumb joint repair   POLYPECTOMY     TEE WITHOUT CARDIOVERSION N/A 04/03/2013   Procedure: TRANSESOPHAGEAL ECHOCARDIOGRAM (TEE);  Surgeon: Chrystie Nose, MD;  Location: [facility] ENDOSCOPY;  Service: Cardiovascular;  Laterality: N/A;   TONSILLECTOMY AND ADENOIDECTOMY     UPPER GASTROINTESTINAL ENDOSCOPY     dilation    Allergies  Allergies  Allergen Reactions   Penicillin G Rash   Quinolones Other (See Comments)    Other reaction(s): Other (See Comments) Fluroquinolone antibiotics should be avoided in patients with history of aortic aneurysm/dissection   Amoxil [Amoxicillin] Rash     Labs/Other Studies Reviewed    The following  studies were reviewed today:  Cardiac Studies & Procedures     STRESS TESTS  NM MYOCAR MULTI W/SPECT W 04/17/2013   ECHOCARDIOGRAM  ECHOCARDIOGRAM COMPLETE 06/28/2021  Narrative ECHOCARDIOGRAM REPORT    Patient Name:   Ricky Thomas. Date of Exam:  06/28/2021 Medical Rec #:  [id number]             Height:       77.0 in Accession #:    [id number]            Weight:       246.0 lb Date of Birth:  1946-07-21              BSA:          2.443 m Patient Age:    75 years              BP:           140/90 mmHg Patient Gender: M                     HR:           57 bpm. Exam Location:  [address]  Procedure: 2D Echo, 3D Echo, Cardiac Doppler, Color Doppler and Strain Analysis  MODIFIED REPORT: This report was modified by Weston Brass MD on 06/28/2021 due to revision. Indications:     I77.810 Aortic root dilation  History:         Patient has prior history of Echocardiogram examinations, most recent 01/17/2017. Mitral Valve Prolapse, Arrythmias:Atrial Fibrillation and Bradycardia; Risk Factors:Hypertension, Sleep Apnea and Dyslipidemia. Ascending aortic aneurysm.  Sonographer:     Garald Braver, RDCS Referring Phys:  [phone] Lennette Bihari Diagnosing Phys: Weston Brass MD  IMPRESSIONS   1. Aortic dilatation noted. Aneurysm of the aortic root with moderate-severe dilation, measuring 51 mm. There is mild dilatation of the ascending aorta, measuring 43 mm. 2. Left ventricular ejection fraction by 3D volume is 46 %. The left ventricle has mildly decreased function. The left ventricle demonstrates global hypokinesis. Left ventricular diastolic parameters are consistent with Grade I diastolic dysfunction (impaired relaxation). The average left ventricular global longitudinal strain is -17.9 %, borderline normal. 3. Right ventricular systolic function is normal. The right ventricular size is normal. 4. Mild bileaflet mitral valve prolapse.. The mitral valve is abnormal. Mild to moderate mitral valve regurgitation. No evidence of mitral stenosis. 5. The aortic valve is grossly normal. Aortic valve regurgitation is mild. No aortic stenosis is present.  Conclusion(s)/Recommendation(s): Recommend ECG gated CTA aorta for further evaluation of  ascending aortic aneurysm with particular attention to the sinus of Valsalva.  FINDINGS Left Ventricle: Left ventricular ejection fraction by 3D volume is 46 %. The left ventricle has mildly decreased function. The left ventricle demonstrates global hypokinesis. The average left ventricular global longitudinal strain is -17.9 %. The left ventricular internal cavity size was normal in size. There is no left ventricular hypertrophy. Left ventricular diastolic parameters are consistent with Grade I diastolic dysfunction (impaired relaxation).  Right Ventricle: The right ventricular size is normal. No increase in right ventricular wall thickness. Right ventricular systolic function is normal.  Left Atrium: Left atrial size was normal in size.  Right Atrium: Right atrial size was normal in size.  Pericardium: There is no evidence of pericardial effusion.  Mitral Valve: Mild bileaflet mitral valve prolapse. The mitral valve is abnormal. Mild to moderate mitral valve regurgitation. No evidence of mitral valve stenosis.  Tricuspid Valve:  The tricuspid valve is normal in structure. Tricuspid valve regurgitation is not demonstrated. No evidence of tricuspid stenosis.  Aortic Valve: The aortic valve is grossly normal. Aortic valve regurgitation is mild. Aortic regurgitation PHT measures 674 msec. No aortic stenosis is present.  Pulmonic Valve: The pulmonic valve was normal in structure. Pulmonic valve regurgitation is mild. No evidence of pulmonic stenosis.  Aorta: The aortic root is normal in size and structure and aortic dilatation noted. There is mild dilatation of the ascending aorta, measuring 43 mm. There is an aneurysm involving the aortic root measuring 51 mm.  Venous: The inferior vena cava was not well visualized.  IAS/Shunts: The interatrial septum was not well visualized.   LEFT VENTRICLE PLAX 2D LVIDd:         5.80 cm         Diastology LVIDs:         4.30 cm         LV e' medial:     2.63 cm/s LV PW:         1.00 cm         LV E/e' medial:  8.1 LV IVS:        1.00 cm         LV e' lateral:   2.85 cm/s LVOT diam:     2.40 cm         LV E/e' lateral: 7.5 LV SV:         64 LV SV Index:   26              2D LVOT Area:     4.52 cm        Longitudinal Strain 2D Strain GLS  -18.2 % (A2C): 2D Strain GLS  -18.3 % (A3C): 2D Strain GLS  -17.2 % (A4C): 2D Strain GLS  -17.9 % Avg:  3D Volume EF LV 3D EF:    Left ventricular ejection fraction by 3D volume is 46 %.  3D Volume EF: 3D EF:        46 % LV EDV:       170 ml LV ESV:       92 ml LV SV:        77 ml  RIGHT VENTRICLE RV Basal diam:  4.30 cm RV S prime:     12.80 cm/s TAPSE (M-mode): 1.9 cm RVSP:           23.6 mmHg  LEFT ATRIUM             Index       RIGHT ATRIUM           Index LA diam:        4.20 cm 1.72 cm/m  RA Pressure: 3.00 mmHg LA Vol (A2C):   80.5 ml 32.96 ml/m RA Area:     19.80 cm LA Vol (A4C):   51.1 ml 20.92 ml/m RA Volume:   46.90 ml  19.20 ml/m LA Biplane Vol: 65.2 ml 26.69 ml/m AORTIC VALVE LVOT Vmax:   63.70 cm/s LVOT Vmean:  42.500 cm/s LVOT VTI:    0.141 m AI PHT:      674 msec  AORTA Ao Root diam: 5.10 cm Ao Asc diam:  4.30 cm  MITRAL VALVE               TRICUSPID VALVE TR Peak grad:   20.6 mmHg TR Vmax:        227.00 cm/s MR Peak grad: 121.4 mmHg  Estimated RAP:  3.00 mmHg MR Mean grad: 76.0 mmHg    RVSP:           23.6 mmHg MR Vmax:      551.00 cm/s MR Vmean:     410.0 cm/s   SHUNTS MV E velocity: 21.30 cm/s  Systemic VTI:  0.14 m MV A velocity: 46.80 cm/s  Systemic Diam: 2.40 cm MV E/A ratio:  0.46  Weston Brass MD Electronically signed by Weston Brass MD Signature Date/Time: 06/28/2021/2:37:46 PM    Final (Updated)    MONITORS  LONG TERM MONITOR (3-14 DAYS) 07/17/2022  Narrative Patch Wear Time:  13 days and 4 hours (2023-06-24T20:22:07-399 to 2023-07-08T00:42:09-399)  Patient had a min HR of 48 bpm, max HR of 164 bpm, and avg HR of 61  bpm. Predominant underlying rhythm was Sinus Rhythm. 1 run of Ventricular Tachycardia occurred lasting 4 beats with a max rate of 143 bpm (avg 135 bpm). 57 Supraventricular Tachycardia runs occurred, the run with the fastest interval lasting 4 beats with a max rate of 164 bpm, the longest lasting 14 beats with an avg rate of 143 bpm. Isolated SVEs were rare (<1.0%), SVE Couplets were rare (<1.0%), and SVE Triplets were rare (<1.0%). Isolated VEs were occasional (2.6%, [id number]), VE Couplets were rare (<1.0%, 377), and VE Triplets were rare (<1.0%, 20). Ventricular Bigeminy and Trigeminy were present.  Predominant rhythm was sinus rhythm with an average rate 61 bpm.  There was 1 short episode of nonsustained VT lasting 4 beats with a heart rate range of 1 19-1 43 (average 135 bpm.  There were short bursts of SVT with the fastest interval at 164 bpm and lasting 4 beats.  The longest interval was 14 beats at an average rate at 143.  PVCs were present.  The patient had 1 episode of ventricular trigeminy lasting 29.5 seconds.  There were no significant sinus pauses.  There is no evidence of any atrial fibrillation.          Recent Labs: 11/21/2022: ALT 31; BUN 15; Creatinine, Ser 1.11; Hemoglobin 15.6; Platelets 213.0; Potassium 4.0; Sodium 142; TSH 2.68  Recent Lipid Panel    Component Value Date/Time   CHOL 102 11/21/2022 1034   CHOL 178 09/18/2020 1038   TRIG 48.0 11/21/2022 1034   HDL 49.10 11/21/2022 1034   HDL 40 09/18/2020 1038   CHOLHDL 2 11/21/2022 1034   VLDL 9.6 11/21/2022 1034   LDLCALC 43 11/21/2022 1034   LDLCALC 122 (H) 09/18/2020 1038    History of Present Illness    77 year old male with the above past medical history including paroxysmal atrial fibrillation, mitral valve prolapse with mild MR, ascending aortic aneurysm, hypertension, hyperlipidemia, TIA, and Alzheimer's dementia.  He has a longstanding history of paroxysmal atrial fibrillation dating back to 2000 when he was  initiated on therapy at [facility].  He underwent successful A-fib ablation with pulmonary vein isolation in June 2014 by Dr. Sedalia Muta at [facility].  Eliquis was subsequently discontinued.  Echocardiogram in 2014 showed EF 45 to 50%, later improved to 50 to 55% on echo in February 2018.  He has a history of dilation of his ascending aorta as well as descending thoracic aortic aneurysm, follows with CT surgery.  Echo in 2022 revealed moderate to severe dilation of the aortic root measuring 51 mm, mild dilation of the ascending aorta measuring 43 mm, EF 46%, mildly decreased LV function, G1 DD, systolic function, mild bileaflet mitral valve prolapse with mild to  moderate mitral valve regurgitation, mild aortic valve regurgitation.  Repeat echocardiogram at [facility] in 03/2022 revealed EF 50%, mild LVH, normal RV systolic function, mild AR, mild MR.  Stable dilation of the ascending aorta measuring 4.6 cm.  He was last seen in the office on 12/12/2022 stable overall from a cardiac standpoint.  He did note some increased palpitations.  Metoprolol was increased to 50 mg morning and 12.5 mg at night.  Cardiac MRI in 03/2023 at [facility] for evaluation of aortic root/ascending aortic aneurysm.  EF 50%, no evidence of infiltrative disease, stable aortic root dimensions. Follow-up CT was recommended in 1 year.  Additionally, he is following with neurology for history of TIA and Alzheimer's dementia.  He presents today for follow-up.  Since his last visit he has been stable overall from a cardiac standpoint.  He notes occasional palpitations exacerbated by caffeine.  He also notes occasional dizziness, he thinks this is a side effect of the rivastigmine.  He is getting ready to begin treatment with Leqembi per neurology for Alzheimer's dementia.  He has follow-up scheduled with his PCP next week and will have annual labs drawn at that time.  Overall, he reports feeling well.  Home Medications    Current Outpatient  Medications  Medication Sig Dispense Refill   aspirin 81 MG EC tablet Take 1 tablet (81 mg total) by mouth daily. Swallow whole. 30 tablet 12   atorvastatin (LIPITOR) 80 MG tablet TAKE 1 TABLET BY MOUTH EVERY DAY 90 tablet 3   b complex vitamins capsule Take 1 capsule by mouth daily.     lisinopril (ZESTRIL) 10 MG tablet TAKE 1 TABLET BY MOUTH EVERY DAY 90 tablet 3   meloxicam (MOBIC) 15 MG tablet Take 15 mg by mouth as needed for pain. TAKE 0.5-1 tablet  AS NEEDED     metoprolol succinate (TOPROL-XL) 25 MG 24 hr tablet Take 2 tablets (50 mg total) by mouth every morning AND 0.5 tablets (12.5 mg total) every evening. (Patient taking differently: Take 25 mg twice daily) 225 tablet 3   rivastigmine (EXELON) 3 MG capsule Take 1 capsule (3 mg total) by mouth 2 (two) times daily. 180 capsule 1   tamsulosin (FLOMAX) 0.4 MG CAPS capsule Take 1 capsule (0.4 mg total) by mouth daily. 90 capsule 3   VITAMIN D PO Take 1 tablet by mouth daily in the afternoon.     zolpidem (AMBIEN) 5 MG tablet TAKE 1 TABLET BY MOUTH AT BEDTIME AS NEEDED FOR SLEEP. 30 tablet 0   No current facility-administered medications for this visit.     Review of Systems    He denies chest pain, dyspnea, pnd, orthopnea, n, v, syncope, edema, weight gain, or early satiety. All other systems reviewed and are otherwise negative except as noted above.   Physical Exam    VS:  BP 96/61 (BP Location: Left Arm, Patient Position: Sitting, Cuff Size: Normal)   Pulse 74   Ht 6\' 4"  (1.93 m)   Wt 224 lb 6.4 oz (101.8 kg)   SpO2 98%   BMI 27.31 kg/m   GEN: Well nourished, well developed, in no acute distress. HEENT: normal. Neck: Supple, no JVD, carotid bruits, or masses. Cardiac: RRR, no murmurs, rubs, or gallops. No clubbing, cyanosis, edema.  Radials/DP/PT 2+ and equal bilaterally.  Respiratory:  Respirations regular and unlabored, clear to auscultation bilaterally. GI: Soft, nontender, nondistended, BS + x 4. MS: no deformity or  atrophy. Skin: warm and dry, no rash. Neuro:  Strength  and sensation are intact. Psych: Normal affect.  Accessory Clinical Findings    ECG personally reviewed by me today -sinus rhythm, 74 bpm, occasional PVCs- no acute changes.   Lab Results  Component Value Date   WBC 5.4 11/21/2022   HGB 15.6 11/21/2022   HCT 45.6 11/21/2022   MCV 91.2 11/21/2022   PLT 213.0 11/21/2022   Lab Results  Component Value Date   CREATININE 1.11 11/21/2022   BUN 15 11/21/2022   NA 142 11/21/2022   K 4.0 11/21/2022   CL 107 11/21/2022   CO2 27 11/21/2022   Lab Results  Component Value Date   ALT 31 11/21/2022   AST 27 11/21/2022   ALKPHOS 64 11/21/2022   BILITOT 0.8 11/21/2022   Lab Results  Component Value Date   CHOL 102 11/21/2022   HDL 49.10 11/21/2022   LDLCALC 43 11/21/2022   TRIG 48.0 11/21/2022   CHOLHDL 2 11/21/2022    Lab Results  Component Value Date   HGBA1C 5.7 11/21/2022    Assessment & Plan    1. Paroxysmal atrial fibrillation: Maintaining sinus rhythm.  No longer on anticoagulation.  He did not notice any improvement in his palpitations with increased metoprolol dosing.  BP has been borderline low.  Will change metoprolol to 25 mg twice daily.   2. Mitral valve prolapse/mitral valve regurgitation: Most recent echo in 03/2022 revealed mild MR and mild AR.  Asymptomatic.  Euvolemic and well compensated on exam. Consider repeat echo as clinically indicated.  3. Ascending aortic aneurysm: Stable on most recent echo and cardiac MRI in 03/2023.  Following with CT surgery at [facility]. BP well-controlled. Avoid heavy lifting.   4. Hypertension: BP well controlled. Continue current antihypertensive regimen.   5. Hyperlipidemia: LDL was 43 in 11/2022.  Continue Lipitor.  6. History of TIA/Alzheimer's dementia: Following with neurology. Continue ASA, Lipitor.   7. Disposition: Follow-up in 6 months with Dr. Tresa Endo.      Joylene Grapes, NP 05/17/2023, 10:16 AM

## 2023-05-17 NOTE — Addendum Note (Signed)
Addended by: Shayne Alken on: 05/17/2023 10:54 AM   Modules accepted: Orders

## 2023-05-17 NOTE — Progress Notes (Signed)
Office Visit    Patient Name: Dustin Berry. Date of Encounter: 05/17/2023  Primary Care Provider:  Corwin Levins, MD Primary Cardiologist:  Nicki Guadalajara, MD  Chief Complaint    77 year old male with a history of paroxysmal atrial fibrillation, mitral valve prolapse with mild MR, ascending aortic aneurysm, hypertension, hyperlipidemia,  TIA, and Alzheimer's dementia who presents for follow-up related to atrial fibrillation.  Past Medical History    Past Medical History:  Diagnosis Date   Abdominal pain 04/01/2013   Acute bronchitis 03/07/2018   Allergic rhinitis 05/12/2008   Aortic atherosclerosis 08/23/2020   Arthritis    Ascending aortic aneurysm 12/13/2018   Atrial fibrillation with RVR 10/21/2012   Echo- EF 50-55%; mild concentric LVH; flow pattern suggestive of impaired LV relaxation; mild mitral valve prolapse, trace mitral regurgitation   Back pain    receiving PT   Benign prostatic hyperplasia 09/05/2012   Boil of buttock 06/24/2011   Bradycardia 04/17/2013   Cataract    Cellulitis of knee, left 04/12/2016   Chest pain with exertion presumed to be tachycardia related along with SOB 04/01/2013   Degeneration of lumbar intervertebral disc 12/10/2020   Eustachian tube dysfunction, bilateral 03/07/2018   External otitis of left ear 06/11/2020   Fatigue 07/04/2011   GERD (gastroesophageal reflux disease) 03/24/2009   Heart murmur    Hematuria, possible 04/01/2013   Hereditary and idiopathic peripheral neuropathy 05/12/2008   HLD (hyperlipidemia) 08/18/2007   HTN (hypertension) 12/04/2013   Insomnia 06/08/2016   Left knee pain 12/13/2018   Low back pain 12/01/2020   Lumbar spondylosis 05/05/2021   Mild cognitive impairment of uncertain or unknown etiology 12/09/2022   Mitral valve prolapse 07/04/2011   MRSA (methicillin resistant staph aureus) culture positive 5+ years ago   Neck pain 05/05/2021   OSA (obstructive sleep apnea) 07/04/2011   Using CPAP  nightly   Pain in right hand 02/04/2020   Palpitations 03/06/2007   R/P MV - mild perfusion defect in basal inferoseptal, basal inferior, mid inferoseptal, and mid inferior regions, consistent w/ infarct/scar; no scintigraphic evidence of inducible myocardial ischemia; prior non transmural infarct cannot be completely excluded; EF 48%; no significant change from previous study   Personal history of colonic polyps 05/12/2008   Radicular pain in left arm 05/05/2021   Sacroiliitis 05/05/2021   Stricture and stenosis of esophagus 03/24/2009   TIA (transient ischemic attack) 11/22/2021   Tinnitus 09/05/2012   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   Vitamin B12 deficiency 06/15/2019   Vitamin D deficiency 05/23/2022   Past Surgical History:  Procedure Laterality Date   ATRIAL FIBRILLATION ABLATION     CARDIOVERSION N/A 04/03/2013   Procedure: CARDIOVERSION;  Surgeon: Chrystie Nose, MD;  Location: [facility] ENDOSCOPY;  Service: Cardiovascular;  Laterality: N/A;   COLONOSCOPY  2018   HAND SURGERY     Thumb joint repair   POLYPECTOMY     TEE WITHOUT CARDIOVERSION N/A 04/03/2013   Procedure: TRANSESOPHAGEAL ECHOCARDIOGRAM (TEE);  Surgeon: Chrystie Nose, MD;  Location: [facility] ENDOSCOPY;  Service: Cardiovascular;  Laterality: N/A;   TONSILLECTOMY AND ADENOIDECTOMY     UPPER GASTROINTESTINAL ENDOSCOPY     dilation    Allergies  Allergies  Allergen Reactions   Penicillin G Rash   Quinolones Other (See Comments)    Other reaction(s): Other (See Comments) Fluroquinolone antibiotics should be avoided in patients with history of aortic aneurysm/dissection   Amoxil [Amoxicillin] Rash     Labs/Other Studies Reviewed    The following  studies were reviewed today:  Cardiac Studies & Procedures     STRESS TESTS  NM MYOCAR MULTI W/SPECT W 04/17/2013   ECHOCARDIOGRAM  ECHOCARDIOGRAM COMPLETE 06/28/2021  Narrative ECHOCARDIOGRAM REPORT    Patient Name:   Dustin Berry. Date of Exam:  06/28/2021 Medical Rec #:  [id number]             Height:       77.0 in Accession #:    [id number]            Weight:       246.0 lb Date of Birth:  1946-07-21              BSA:          2.443 m Patient Age:    75 years              BP:           140/90 mmHg Patient Gender: M                     HR:           57 bpm. Exam Location:  [address]  Procedure: 2D Echo, 3D Echo, Cardiac Doppler, Color Doppler and Strain Analysis  MODIFIED REPORT: This report was modified by Weston Brass MD on 06/28/2021 due to revision. Indications:     I77.810 Aortic root dilation  History:         Patient has prior history of Echocardiogram examinations, most recent 01/17/2017. Mitral Valve Prolapse, Arrythmias:Atrial Fibrillation and Bradycardia; Risk Factors:Hypertension, Sleep Apnea and Dyslipidemia. Ascending aortic aneurysm.  Sonographer:     Garald Braver, RDCS Referring Phys:  [phone] Lennette Bihari Diagnosing Phys: Weston Brass MD  IMPRESSIONS   1. Aortic dilatation noted. Aneurysm of the aortic root with moderate-severe dilation, measuring 51 mm. There is mild dilatation of the ascending aorta, measuring 43 mm. 2. Left ventricular ejection fraction by 3D volume is 46 %. The left ventricle has mildly decreased function. The left ventricle demonstrates global hypokinesis. Left ventricular diastolic parameters are consistent with Grade I diastolic dysfunction (impaired relaxation). The average left ventricular global longitudinal strain is -17.9 %, borderline normal. 3. Right ventricular systolic function is normal. The right ventricular size is normal. 4. Mild bileaflet mitral valve prolapse.. The mitral valve is abnormal. Mild to moderate mitral valve regurgitation. No evidence of mitral stenosis. 5. The aortic valve is grossly normal. Aortic valve regurgitation is mild. No aortic stenosis is present.  Conclusion(s)/Recommendation(s): Recommend ECG gated CTA aorta for further evaluation of  ascending aortic aneurysm with particular attention to the sinus of Valsalva.  FINDINGS Left Ventricle: Left ventricular ejection fraction by 3D volume is 46 %. The left ventricle has mildly decreased function. The left ventricle demonstrates global hypokinesis. The average left ventricular global longitudinal strain is -17.9 %. The left ventricular internal cavity size was normal in size. There is no left ventricular hypertrophy. Left ventricular diastolic parameters are consistent with Grade I diastolic dysfunction (impaired relaxation).  Right Ventricle: The right ventricular size is normal. No increase in right ventricular wall thickness. Right ventricular systolic function is normal.  Left Atrium: Left atrial size was normal in size.  Right Atrium: Right atrial size was normal in size.  Pericardium: There is no evidence of pericardial effusion.  Mitral Valve: Mild bileaflet mitral valve prolapse. The mitral valve is abnormal. Mild to moderate mitral valve regurgitation. No evidence of mitral valve stenosis.  Tricuspid Valve:  The tricuspid valve is normal in structure. Tricuspid valve regurgitation is not demonstrated. No evidence of tricuspid stenosis.  Aortic Valve: The aortic valve is grossly normal. Aortic valve regurgitation is mild. Aortic regurgitation PHT measures 674 msec. No aortic stenosis is present.  Pulmonic Valve: The pulmonic valve was normal in structure. Pulmonic valve regurgitation is mild. No evidence of pulmonic stenosis.  Aorta: The aortic root is normal in size and structure and aortic dilatation noted. There is mild dilatation of the ascending aorta, measuring 43 mm. There is an aneurysm involving the aortic root measuring 51 mm.  Venous: The inferior vena cava was not well visualized.  IAS/Shunts: The interatrial septum was not well visualized.   LEFT VENTRICLE PLAX 2D LVIDd:         5.80 cm         Diastology LVIDs:         4.30 cm         LV e' medial:     2.63 cm/s LV PW:         1.00 cm         LV E/e' medial:  8.1 LV IVS:        1.00 cm         LV e' lateral:   2.85 cm/s LVOT diam:     2.40 cm         LV E/e' lateral: 7.5 LV SV:         64 LV SV Index:   26              2D LVOT Area:     4.52 cm        Longitudinal Strain 2D Strain GLS  -18.2 % (A2C): 2D Strain GLS  -18.3 % (A3C): 2D Strain GLS  -17.2 % (A4C): 2D Strain GLS  -17.9 % Avg:  3D Volume EF LV 3D EF:    Left ventricular ejection fraction by 3D volume is 46 %.  3D Volume EF: 3D EF:        46 % LV EDV:       170 ml LV ESV:       92 ml LV SV:        77 ml  RIGHT VENTRICLE RV Basal diam:  4.30 cm RV S prime:     12.80 cm/s TAPSE (M-mode): 1.9 cm RVSP:           23.6 mmHg  LEFT ATRIUM             Index       RIGHT ATRIUM           Index LA diam:        4.20 cm 1.72 cm/m  RA Pressure: 3.00 mmHg LA Vol (A2C):   80.5 ml 32.96 ml/m RA Area:     19.80 cm LA Vol (A4C):   51.1 ml 20.92 ml/m RA Volume:   46.90 ml  19.20 ml/m LA Biplane Vol: 65.2 ml 26.69 ml/m AORTIC VALVE LVOT Vmax:   63.70 cm/s LVOT Vmean:  42.500 cm/s LVOT VTI:    0.141 m AI PHT:      674 msec  AORTA Ao Root diam: 5.10 cm Ao Asc diam:  4.30 cm  MITRAL VALVE               TRICUSPID VALVE TR Peak grad:   20.6 mmHg TR Vmax:        227.00 cm/s MR Peak grad: 121.4 mmHg  Estimated RAP:  3.00 mmHg MR Mean grad: 76.0 mmHg    RVSP:           23.6 mmHg MR Vmax:      551.00 cm/s MR Vmean:     410.0 cm/s   SHUNTS MV E velocity: 21.30 cm/s  Systemic VTI:  0.14 m MV A velocity: 46.80 cm/s  Systemic Diam: 2.40 cm MV E/A ratio:  0.46  Weston Brass MD Electronically signed by Weston Brass MD Signature Date/Time: 06/28/2021/2:37:46 PM    Final (Updated)    MONITORS  LONG TERM MONITOR (3-14 DAYS) 07/17/2022  Narrative Patch Wear Time:  13 days and 4 hours (2023-06-24T20:22:07-399 to 2023-07-08T00:42:09-399)  Patient had a min HR of 48 bpm, max HR of 164 bpm, and avg HR of 61  bpm. Predominant underlying rhythm was Sinus Rhythm. 1 run of Ventricular Tachycardia occurred lasting 4 beats with a max rate of 143 bpm (avg 135 bpm). 57 Supraventricular Tachycardia runs occurred, the run with the fastest interval lasting 4 beats with a max rate of 164 bpm, the longest lasting 14 beats with an avg rate of 143 bpm. Isolated SVEs were rare (<1.0%), SVE Couplets were rare (<1.0%), and SVE Triplets were rare (<1.0%). Isolated VEs were occasional (2.6%, [id number]), VE Couplets were rare (<1.0%, 377), and VE Triplets were rare (<1.0%, 20). Ventricular Bigeminy and Trigeminy were present.  Predominant rhythm was sinus rhythm with an average rate 61 bpm.  There was 1 short episode of nonsustained VT lasting 4 beats with a heart rate range of 1 19-1 43 (average 135 bpm.  There were short bursts of SVT with the fastest interval at 164 bpm and lasting 4 beats.  The longest interval was 14 beats at an average rate at 143.  PVCs were present.  The patient had 1 episode of ventricular trigeminy lasting 29.5 seconds.  There were no significant sinus pauses.  There is no evidence of any atrial fibrillation.          Recent Labs: 11/21/2022: ALT 31; BUN 15; Creatinine, Ser 1.11; Hemoglobin 15.6; Platelets 213.0; Potassium 4.0; Sodium 142; TSH 2.68  Recent Lipid Panel    Component Value Date/Time   CHOL 102 11/21/2022 1034   CHOL 178 09/18/2020 1038   TRIG 48.0 11/21/2022 1034   HDL 49.10 11/21/2022 1034   HDL 40 09/18/2020 1038   CHOLHDL 2 11/21/2022 1034   VLDL 9.6 11/21/2022 1034   LDLCALC 43 11/21/2022 1034   LDLCALC 122 (H) 09/18/2020 1038    History of Present Illness    77 year old male with the above past medical history including paroxysmal atrial fibrillation, mitral valve prolapse with mild MR, ascending aortic aneurysm, hypertension, hyperlipidemia, TIA, and Alzheimer's dementia.  He has a longstanding history of paroxysmal atrial fibrillation dating back to 2000 when he was  initiated on therapy at [facility].  He underwent successful A-fib ablation with pulmonary vein isolation in June 2014 by Dr. Sedalia Muta at [facility].  Eliquis was subsequently discontinued.  Echocardiogram in 2014 showed EF 45 to 50%, later improved to 50 to 55% on echo in February 2018.  He has a history of dilation of his ascending aorta as well as descending thoracic aortic aneurysm, follows with CT surgery.  Echo in 2022 revealed moderate to severe dilation of the aortic root measuring 51 mm, mild dilation of the ascending aorta measuring 43 mm, EF 46%, mildly decreased LV function, G1 DD, systolic function, mild bileaflet mitral valve prolapse with mild to  moderate mitral valve regurgitation, mild aortic valve regurgitation.  Repeat echocardiogram at [facility] in 03/2022 revealed EF 50%, mild LVH, normal RV systolic function, mild AR, mild MR.  Stable dilation of the ascending aorta measuring 4.6 cm.  He was last seen in the office on 12/12/2022 stable overall from a cardiac standpoint.  He did note some increased palpitations.  Metoprolol was increased to 50 mg morning and 12.5 mg at night.  Cardiac MRI in 03/2023 at [facility] for evaluation of aortic root/ascending aortic aneurysm.  EF 50%, no evidence of infiltrative disease, stable aortic root dimensions. Follow-up CT was recommended in 1 year.  Additionally, he is following with neurology for history of TIA and Alzheimer's dementia.  He presents today for follow-up.  Since his last visit he has been stable overall from a cardiac standpoint.  He notes occasional palpitations exacerbated by caffeine.  He also notes occasional dizziness, he thinks this is a side effect of the rivastigmine.  He is getting ready to begin treatment with Leqembi per neurology for Alzheimer's dementia.  He has follow-up scheduled with his PCP next week and will have annual labs drawn at that time.  Overall, he reports feeling well.  Home Medications    Current Outpatient  Medications  Medication Sig Dispense Refill   aspirin 81 MG EC tablet Take 1 tablet (81 mg total) by mouth daily. Swallow whole. 30 tablet 12   atorvastatin (LIPITOR) 80 MG tablet TAKE 1 TABLET BY MOUTH EVERY DAY 90 tablet 3   b complex vitamins capsule Take 1 capsule by mouth daily.     lisinopril (ZESTRIL) 10 MG tablet TAKE 1 TABLET BY MOUTH EVERY DAY 90 tablet 3   meloxicam (MOBIC) 15 MG tablet Take 15 mg by mouth as needed for pain. TAKE 0.5-1 tablet  AS NEEDED     metoprolol succinate (TOPROL-XL) 25 MG 24 hr tablet Take 2 tablets (50 mg total) by mouth every morning AND 0.5 tablets (12.5 mg total) every evening. (Patient taking differently: Take 25 mg twice daily) 225 tablet 3   rivastigmine (EXELON) 3 MG capsule Take 1 capsule (3 mg total) by mouth 2 (two) times daily. 180 capsule 1   tamsulosin (FLOMAX) 0.4 MG CAPS capsule Take 1 capsule (0.4 mg total) by mouth daily. 90 capsule 3   VITAMIN D PO Take 1 tablet by mouth daily in the afternoon.     zolpidem (AMBIEN) 5 MG tablet TAKE 1 TABLET BY MOUTH AT BEDTIME AS NEEDED FOR SLEEP. 30 tablet 0   No current facility-administered medications for this visit.     Review of Systems    He denies chest pain, dyspnea, pnd, orthopnea, n, v, syncope, edema, weight gain, or early satiety. All other systems reviewed and are otherwise negative except as noted above.   Physical Exam    VS:  BP 96/61 (BP Location: Left Arm, Patient Position: Sitting, Cuff Size: Normal)   Pulse 74   Ht 6\' 4"  (1.93 m)   Wt 224 lb 6.4 oz (101.8 kg)   SpO2 98%   BMI 27.31 kg/m   GEN: Well nourished, well developed, in no acute distress. HEENT: normal. Neck: Supple, no JVD, carotid bruits, or masses. Cardiac: RRR, no murmurs, rubs, or gallops. No clubbing, cyanosis, edema.  Radials/DP/PT 2+ and equal bilaterally.  Respiratory:  Respirations regular and unlabored, clear to auscultation bilaterally. GI: Soft, nontender, nondistended, BS + x 4. MS: no deformity or  atrophy. Skin: warm and dry, no rash. Neuro:  Strength  and sensation are intact. Psych: Normal affect.  Accessory Clinical Findings    ECG personally reviewed by me today -sinus rhythm, 74 bpm, occasional PVCs- no acute changes.   Lab Results  Component Value Date   WBC 5.4 11/21/2022   HGB 15.6 11/21/2022   HCT 45.6 11/21/2022   MCV 91.2 11/21/2022   PLT 213.0 11/21/2022   Lab Results  Component Value Date   CREATININE 1.11 11/21/2022   BUN 15 11/21/2022   NA 142 11/21/2022   K 4.0 11/21/2022   CL 107 11/21/2022   CO2 27 11/21/2022   Lab Results  Component Value Date   ALT 31 11/21/2022   AST 27 11/21/2022   ALKPHOS 64 11/21/2022   BILITOT 0.8 11/21/2022   Lab Results  Component Value Date   CHOL 102 11/21/2022   HDL 49.10 11/21/2022   LDLCALC 43 11/21/2022   TRIG 48.0 11/21/2022   CHOLHDL 2 11/21/2022    Lab Results  Component Value Date   HGBA1C 5.7 11/21/2022    Assessment & Plan    1. Paroxysmal atrial fibrillation: Maintaining sinus rhythm.  No longer on anticoagulation.  He did not notice any improvement in his palpitations with increased metoprolol dosing.  BP has been borderline low.  Will change metoprolol to 25 mg twice daily.   2. Mitral valve prolapse/mitral valve regurgitation: Most recent echo in 03/2022 revealed mild MR and mild AR.  Asymptomatic.  Euvolemic and well compensated on exam. Consider repeat echo as clinically indicated.  3. Ascending aortic aneurysm: Stable on most recent echo and cardiac MRI in 03/2023.  Following with CT surgery at [facility]. BP well-controlled. Avoid heavy lifting.   4. Hypertension: BP well controlled. Continue current antihypertensive regimen.   5. Hyperlipidemia: LDL was 43 in 11/2022.  Continue Lipitor.  6. History of TIA/Alzheimer's dementia: Following with neurology. Continue ASA, Lipitor.   7. Disposition: Follow-up in 6 months with Dr. Tresa Endo.      Joylene Grapes, NP 05/17/2023, 10:16 AM

## 2023-05-17 NOTE — Patient Instructions (Addendum)
Medication Instructions:  Metoprolol Succinate take 25 mg twice daily.   *If you need a refill on your cardiac medications before your next appointment, please call your pharmacy*   Lab Work: NONE ordered at this time of appointment    Testing/Procedures: NONE ordered at this time of appointment     Follow-Up: At [facility], you and your health needs are our priority.  As part of our continuing mission to provide you with exceptional heart care, we have created designated Provider Care Teams.  These Care Teams include your primary Cardiologist (physician) and Advanced Practice Providers (APPs -  Physician Assistants and Nurse Practitioners) who all work together to provide you with the care you need, when you need it.  We recommend signing up for the patient portal called "MyChart".  Sign up information is provided on this After Visit Summary.  MyChart is used to connect with patients for Virtual Visits (Telemedicine).  Patients are able to view lab/test results, encounter notes, upcoming appointments, etc.  Non-urgent messages can be sent to your provider as well.   To learn more about what you can do with MyChart, go to [email or web address].    Your next appointment:   6 month(s)  Provider:   Nicki Guadalajara, MD     Other Instructions

## 2023-05-19 ENCOUNTER — Ambulatory Visit
Admission: RE | Admit: 2023-05-19 | Discharge: 2023-05-19 | Disposition: A | Discharge status: Home / Self Care | Payer: Medicare Other | Source: Ambulatory Visit | Attending: Neurology | Admitting: Neurology

## 2023-05-19 DIAGNOSIS — G3184 Mild cognitive impairment, so stated: Secondary | ICD-10-CM

## 2023-05-19 DIAGNOSIS — F02A0 Dementia in other diseases classified elsewhere, mild, without behavioral disturbance, psychotic disturbance, mood disturbance, and anxiety: Secondary | ICD-10-CM

## 2023-05-19 DIAGNOSIS — G301 Alzheimer's disease with late onset: Secondary | ICD-10-CM

## 2023-05-20 LAB — APOE ALZHEIMER'S RISK

## 2023-05-23 ENCOUNTER — Encounter: Payer: Self-pay | Admitting: Internal Medicine

## 2023-05-23 ENCOUNTER — Ambulatory Visit (INDEPENDENT_AMBULATORY_CARE_PROVIDER_SITE_OTHER): Payer: Medicare Other | Admitting: Internal Medicine

## 2023-05-23 VITALS — BP 118/70 | HR 62 | Temp 97.7°F | Ht 76.0 in | Wt 226.0 lb

## 2023-05-23 DIAGNOSIS — I10 Essential (primary) hypertension: Secondary | ICD-10-CM

## 2023-05-23 DIAGNOSIS — E538 Deficiency of other specified B group vitamins: Secondary | ICD-10-CM | POA: Diagnosis not present

## 2023-05-23 DIAGNOSIS — E559 Vitamin D deficiency, unspecified: Secondary | ICD-10-CM | POA: Diagnosis not present

## 2023-05-23 DIAGNOSIS — G3184 Mild cognitive impairment, so stated: Secondary | ICD-10-CM | POA: Diagnosis not present

## 2023-05-23 DIAGNOSIS — E7800 Pure hypercholesterolemia, unspecified: Secondary | ICD-10-CM | POA: Diagnosis not present

## 2023-05-23 DIAGNOSIS — M461 Sacroiliitis, not elsewhere classified: Secondary | ICD-10-CM

## 2023-05-23 MED ORDER — LISINOPRIL 5 MG PO TABS
5.0000 mg | ORAL_TABLET | Freq: Every day | ORAL | 3 refills | Status: DC
Start: 2023-05-23 — End: 2023-11-22

## 2023-05-23 NOTE — Patient Instructions (Signed)
Ok to decrease the lisinopril to 5 mg due to the lower BP's  Please continue all other medications as before, though another option would be to stop the lsinopril completely or reduce the toprol xl if you continue to have symptomatic lower BPs  Please have the pharmacy call with any other refills you may need.  Please continue your efforts at being more active, low cholesterol diet, and weight control.  Please keep your appointments with your specialists as you may have planned  We can hold on more lab testing today  Please make an Appointment to return in 6 months, or sooner if needed

## 2023-05-23 NOTE — Progress Notes (Signed)
Patient ID: Dustin Sniff., male   DOB: March 06, 1946, 77 y.o.   MRN: [id number]        Chief Complaint: follow up HTN with dizziness,  HLD , low b12 and vit d       HPI:  Dustin Berry. is a 77 y.o. male here with c/o several episodes dizziness and lower BP at home on current med tx, Pt denies chest pain, increased sob or doe, wheezing, orthopnea, PND, increased LE swelling, palpitations, or syncope.   Pt denies polydipsia, polyuria, or new focal neuro s/s.    Pt denies fever, wt loss, night sweats, loss of appetite, or other constitutional symptoms MCI symptoms overall stable symptomatically, and not assoc with behavioral changes such as hallucinations, paranoia, or agitation. Pt continues to have recurring sacroiliac pain without change in severity, bowel or bladder change, fever, wt loss,  worsening LE pain/numbness/weakness, gait change or falls.       Wt Readings from Last 3 Encounters:  05/23/23 226 lb (102.5 kg)  05/17/23 224 lb 6.4 oz (101.8 kg)  05/03/23 229 lb (103.9 kg)   BP Readings from Last 3 Encounters:  05/23/23 118/70  05/17/23 96/61  05/03/23 125/77         Past Medical History:  Diagnosis Date   Abdominal pain 04/01/2013   Acute bronchitis 03/07/2018   Allergic rhinitis 05/12/2008   Aortic atherosclerosis 08/23/2020   Arthritis    Ascending aortic aneurysm 12/13/2018   Atrial fibrillation with RVR 10/21/2012   Echo- EF 50-55%; mild concentric LVH; flow pattern suggestive of impaired LV relaxation; mild mitral valve prolapse, trace mitral regurgitation   Back pain    receiving PT   Benign prostatic hyperplasia 09/05/2012   Boil of buttock 06/24/2011   Bradycardia 04/17/2013   Cataract    Cellulitis of knee, left 04/12/2016   Chest pain with exertion presumed to be tachycardia related along with SOB 04/01/2013   Degeneration of lumbar intervertebral disc 12/10/2020   Eustachian tube dysfunction, bilateral 03/07/2018   External otitis of left ear  06/11/2020   Fatigue 07/04/2011   GERD (gastroesophageal reflux disease) 03/24/2009   Heart murmur    Hematuria, possible 04/01/2013   Hereditary and idiopathic peripheral neuropathy 05/12/2008   HLD (hyperlipidemia) 08/18/2007   HTN (hypertension) 12/04/2013   Insomnia 06/08/2016   Left knee pain 12/13/2018   Low back pain 12/01/2020   Lumbar spondylosis 05/05/2021   Mild cognitive impairment of uncertain or unknown etiology 12/09/2022   Mitral valve prolapse 07/04/2011   MRSA (methicillin resistant staph aureus) culture positive 5+ years ago   Neck pain 05/05/2021   OSA (obstructive sleep apnea) 07/04/2011   Using CPAP nightly   Pain in right hand 02/04/2020   Palpitations 03/06/2007   R/P MV - mild perfusion defect in basal inferoseptal, basal inferior, mid inferoseptal, and mid inferior regions, consistent w/ infarct/scar; no scintigraphic evidence of inducible myocardial ischemia; prior non transmural infarct cannot be completely excluded; EF 48%; no significant change from previous study   Personal history of colonic polyps 05/12/2008   Radicular pain in left arm 05/05/2021   Sacroiliitis 05/05/2021   Stricture and stenosis of esophagus 03/24/2009   TIA (transient ischemic attack) 11/22/2021   Tinnitus 09/05/2012   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   Vitamin B12 deficiency 06/15/2019   Vitamin D deficiency 05/23/2022   Past Surgical History:  Procedure Laterality Date   ATRIAL FIBRILLATION ABLATION     CARDIOVERSION N/A 04/03/2013   Procedure: CARDIOVERSION;  Surgeon: Chrystie Nose, MD;  Location: [facility] ENDOSCOPY;  Service: Cardiovascular;  Laterality: N/A;   COLONOSCOPY  2018   HAND SURGERY     Thumb joint repair   POLYPECTOMY     TEE WITHOUT CARDIOVERSION N/A 04/03/2013   Procedure: TRANSESOPHAGEAL ECHOCARDIOGRAM (TEE);  Surgeon: Chrystie Nose, MD;  Location: [facility] ENDOSCOPY;  Service: Cardiovascular;  Laterality: N/A;   TONSILLECTOMY AND ADENOIDECTOMY     UPPER  GASTROINTESTINAL ENDOSCOPY     dilation    reports that he has never smoked. He has never used smokeless tobacco. He reports that he does not currently use alcohol. He reports that he does not use drugs. family history includes Alzheimer's disease in his mother; Breast cancer in his maternal grandmother; Dementia in his mother; Heart disease (age of onset: 13) in his father; Melanoma in his sister; Stroke in his maternal grandfather. Allergies  Allergen Reactions   Penicillin G Rash   Quinolones Other (See Comments)    Other reaction(s): Other (See Comments) Fluroquinolone antibiotics should be avoided in patients with history of aortic aneurysm/dissection   Amoxil [Amoxicillin] Rash   Current Outpatient Medications on File Prior to Visit  Medication Sig Dispense Refill   aspirin 81 MG EC tablet Take 1 tablet (81 mg total) by mouth daily. Swallow whole. 30 tablet 12   atorvastatin (LIPITOR) 80 MG tablet TAKE 1 TABLET BY MOUTH EVERY DAY 90 tablet 3   b complex vitamins capsule Take 1 capsule by mouth daily.     meloxicam (MOBIC) 15 MG tablet Take 15 mg by mouth as needed for pain. TAKE 0.5-1 tablet  AS NEEDED     metoprolol succinate (TOPROL-XL) 25 MG 24 hr tablet Take 2 tablets (50 mg total) by mouth every morning AND 0.5 tablets (12.5 mg total) every evening. (Patient taking differently: Take 25 mg twice daily) 225 tablet 3   tamsulosin (FLOMAX) 0.4 MG CAPS capsule Take 1 capsule (0.4 mg total) by mouth daily. 90 capsule 3   VITAMIN D PO Take 1 tablet by mouth daily in the afternoon.     zolpidem (AMBIEN) 5 MG tablet TAKE 1 TABLET BY MOUTH AT BEDTIME AS NEEDED FOR SLEEP. 30 tablet 0   No current facility-administered medications on file prior to visit.        ROS:  All others reviewed and negative.  Objective        PE:  BP 118/70 (BP Location: Right Arm, Patient Position: Sitting, Cuff Size: Normal)   Pulse 62   Temp 97.7 F (36.5 C) (Oral)   Ht 6\' 4"  (1.93 m)   Wt 226 lb (102.5  kg)   SpO2 98%   BMI 27.51 kg/m                 Constitutional: Pt appears in NAD               HENT: Head: NCAT.                Right Ear: External ear normal.                 Left Ear: External ear normal.                Eyes: . Pupils are equal, round, and reactive to light. Conjunctivae and EOM are normal               Nose: without d/c or deformity  Neck: Neck supple. Gross normal ROM               Cardiovascular: Normal rate and regular rhythm.                 Pulmonary/Chest: Effort normal and breath sounds without rales or wheezing.                Abd:  Soft, NT, ND, + BS, no organomegaly               Neurological: Pt is alert. At baseline orientation, motor grossly intact               Skin: Skin is warm. No rashes, no other new lesions, LE edema - none               Psychiatric: Pt behavior is normal without agitation   Micro: none  Cardiac tracings I have personally interpreted today:  none  Pertinent Radiological findings (summarize): none   Lab Results  Component Value Date   WBC 5.4 11/21/2022   HGB 15.6 11/21/2022   HCT 45.6 11/21/2022   PLT 213.0 11/21/2022   GLUCOSE 102 (H) 11/21/2022   CHOL 102 11/21/2022   TRIG 48.0 11/21/2022   HDL 49.10 11/21/2022   LDLCALC 43 11/21/2022   ALT 31 11/21/2022   AST 27 11/21/2022   NA 142 11/21/2022   K 4.0 11/21/2022   CL 107 11/21/2022   CREATININE 1.11 11/21/2022   BUN 15 11/21/2022   CO2 27 11/21/2022   TSH 2.68 11/21/2022   PSA 1.31 05/23/2022   INR 1.19 04/01/2013   HGBA1C 5.7 11/21/2022   Assessment/Plan:  Dustin Berry. is a 77 y.o. White or Caucasian [1] male with  has a past medical history of Abdominal pain (04/01/2013), Acute bronchitis (03/07/2018), Allergic rhinitis (05/12/2008), Aortic atherosclerosis (08/23/2020), Arthritis, Ascending aortic aneurysm (12/13/2018), Atrial fibrillation with RVR (10/21/2012), Back pain, Benign prostatic hyperplasia (09/05/2012), Boil of buttock  (06/24/2011), Bradycardia (04/17/2013), Cataract, Cellulitis of knee, left (04/12/2016), Chest pain with exertion presumed to be tachycardia related along with SOB (04/01/2013), Degeneration of lumbar intervertebral disc (12/10/2020), Eustachian tube dysfunction, bilateral (03/07/2018), External otitis of left ear (06/11/2020), Fatigue (07/04/2011), GERD (gastroesophageal reflux disease) (03/24/2009), Heart murmur, Hematuria, possible (04/01/2013), Hereditary and idiopathic peripheral neuropathy (05/12/2008), HLD (hyperlipidemia) (08/18/2007), HTN (hypertension) (12/04/2013), Insomnia (06/08/2016), Left knee pain (12/13/2018), Low back pain (12/01/2020), Lumbar spondylosis (05/05/2021), Mild cognitive impairment of uncertain or unknown etiology (12/09/2022), Mitral valve prolapse (07/04/2011), MRSA (methicillin resistant staph aureus) culture positive (5+ years ago), Neck pain (05/05/2021), OSA (obstructive sleep apnea) (07/04/2011), Pain in right hand (02/04/2020), Palpitations (03/06/2007), Personal history of colonic polyps (05/12/2008), Radicular pain in left arm (05/05/2021), Sacroiliitis (05/05/2021), Stricture and stenosis of esophagus (03/24/2009), TIA (transient ischemic attack) (11/22/2021), Tinnitus (09/05/2012), Tuberous sclerosis (06/08/2016), Vitamin B12 deficiency (06/15/2019), and Vitamin D deficiency (05/23/2022).  HLD (hyperlipidemia) Lab Results  Component Value Date   LDLCALC 43 11/21/2022   Stable, pt to continue current statin lipitor 80 mg qd   HTN (hypertension) Likely overcontrolled given symptoms, for decrease lsinopril 5 qd  Vitamin B12 deficiency Lab Results  Component Value Date   VITAMINB12 823 09/05/2022   Stable, cont oral replacement - b12 1000 mcg qd   Vitamin D deficiency Last vitamin D Lab Results  Component Value Date   VD25OH 64.08 11/21/2022   Stable, cont oral replacement   Mild cognitive impairment of uncertain or unknown etiology Overall  stable, continue exelon 3 mg  bid  Sacroiliitis Stable, cont mobic prn  Followup: Return in about 6 months (around 11/22/2023).  Oliver Barre, MD 05/27/2023 3:10 PM [facility]  [facility] - [facility] Internal Medicine

## 2023-05-24 ENCOUNTER — Telehealth (INDEPENDENT_AMBULATORY_CARE_PROVIDER_SITE_OTHER): Payer: Self-pay

## 2023-05-24 NOTE — Telephone Encounter (Signed)
Returned pt call and provided information on expectations for up coming clinical visit as scheduled. Pt to deliberate and will call back to confirm or change appt .Marland KitchenRuben Gottron, RN

## 2023-05-25 ENCOUNTER — Other Ambulatory Visit: Payer: Self-pay | Admitting: Neurology

## 2023-05-25 ENCOUNTER — Encounter: Payer: Self-pay | Admitting: Neurology

## 2023-05-25 MED ORDER — RIVASTIGMINE TARTRATE 3 MG PO CAPS: 3.0000 mg | ORAL_CAPSULE | Freq: Two times a day (BID) | ORAL | 1 refills | Status: DC

## 2023-05-25 NOTE — Addendum Note (Signed)
Addended by: Shayne Alken on: 05/25/2023 11:28 AM   Modules accepted: Orders

## 2023-05-26 ENCOUNTER — Encounter: Payer: Self-pay | Admitting: Neurology

## 2023-05-26 ENCOUNTER — Encounter: Payer: Self-pay | Admitting: Cardiovascular Disease

## 2023-05-27 ENCOUNTER — Encounter: Payer: Self-pay | Admitting: Internal Medicine

## 2023-05-27 NOTE — Assessment & Plan Note (Signed)
Likely overcontrolled given symptoms, for decrease lsinopril 5 qd

## 2023-05-27 NOTE — Assessment & Plan Note (Signed)
Lab Results  Component Value Date   VITAMINB12 823 09/05/2022   Stable, cont oral replacement - b12 1000 mcg qd

## 2023-05-27 NOTE — Assessment & Plan Note (Signed)
Overall stable, continue exelon 3 mg bid

## 2023-05-27 NOTE — Assessment & Plan Note (Signed)
Last vitamin D Lab Results  Component Value Date   VD25OH 64.08 11/21/2022   Stable, cont oral replacement

## 2023-05-27 NOTE — Assessment & Plan Note (Signed)
Lab Results  Component Value Date   LDLCALC 43 11/21/2022   Stable, pt to continue current statin lipitor 80 mg qd

## 2023-05-27 NOTE — Assessment & Plan Note (Signed)
Stable, cont mobic prn

## 2023-05-30 ENCOUNTER — Ambulatory Visit (INDEPENDENT_AMBULATORY_CARE_PROVIDER_SITE_OTHER): Payer: Self-pay

## 2023-05-30 NOTE — Telephone Encounter (Addendum)
Spoke with wife about let an this is an early feasibility study which is approved to enroll only a handful of participants. There is no guarantee that any clinic patient will be screened or enrolled. Pt requesting appointment be cancelled at this time.     Renato Gails, RN          ----- Message from Madilyn Hook, RN sent at 05/26/2023  2:33 PM EDT -----  Regarding: RE: malone  ----- Message from Crista Curb sent at 05/26/2023 11:08 AM EDT -----  Pts wife is calling she is very confused with what is going on   she got a call stating that pt was approved for the ultrasound trial  then another call stating that he was not    she needs to be the main contact due to Daulton not understanding the info given and able to contain it as well     she stated that she spoke with Jenel Lucks       he is supposed to start Leqembi  next week at another facility    please reach out to Aram Beecham to get everything straightened out     Aram Beecham [phone]

## 2023-06-03 ENCOUNTER — Other Ambulatory Visit: Payer: Self-pay | Admitting: Internal Medicine

## 2023-06-09 ENCOUNTER — Encounter: Payer: Self-pay | Admitting: Internal Medicine

## 2023-06-14 ENCOUNTER — Other Ambulatory Visit: Payer: Self-pay | Admitting: Internal Medicine

## 2023-06-18 NOTE — Telephone Encounter (Signed)
I think he prefers you due to the language barrier.   Thanks

## 2023-06-20 ENCOUNTER — Telehealth: Payer: Self-pay | Admitting: Neurology

## 2023-06-20 ENCOUNTER — Other Ambulatory Visit: Payer: Self-pay | Admitting: Neurology

## 2023-06-20 DIAGNOSIS — G301 Alzheimer's disease with late onset: Secondary | ICD-10-CM

## 2023-06-20 NOTE — H&P (Deleted)
[facility]    Date: *** June 20, 2023   Patient Name: Ricky Thomas   MRN: [id number]   PCP: No primary care provider on file.  Referring Provider: No ref. provider found    On the day of the encounter, a total of  *** minutes was spent on this patient encounter including review of historical information, examination, documentation and post-visit activities. I discussed the diagnosis, prognosis, and treatment options.  pre-clinic chart or interval history review: *** min  Face-to-face time: *** min  Documentation time:*** min    The patient was interviewed and examined with Dr. Marland Kitchen  Assessment / Plan :   Impression:     In summary, this is a 77 y.o., {NS HANDEDNESS (AMB):[phone]}, {man/woman:28069}, who presented to the [facility] for *** {DURATION:20312} of cognitive changes.     Initial impression based on interview, review of availible laboratory data and physical exam before cognitive testing and consensus conference-       ***    First-line investigations will include structural neuroimaging and neuropsychological testing, the latter to establish a baseline in addition to the formation of a cognitive profile that can inform the differential diagnosis.     We discussed the potential role of the nootropic agent(s), {jmnootropic:48387}. These medications may temporarily stabilize cognitive decline and briefly increase day-to-day functionality. The nuances of the efficacy of these medications were also reviewed.     Care Issues Addressed:    Psychosocial barriers: {PSYCHOSOCIAL STATUS:[phone]}    Neuropsychiatric symptom(s): ***    Medication review: Patient is not on medication known to negatively impact cognitive function     Driving: ***    Decision-making capacity: {DECISION CAPACITY:50022218}    Advanced directives: {advanced directive:12455}    Safety: {SAFETY RISKS (ION):62952}    Caregiver status: {NA  Wildcard:41752}        Diagnosis(es):    Provisional, pre-conference diagnosis:   MMSE:  FAQ:   Presence of cerebrovascular disease:   Contributive factors:     Recomendations  Jatorian Renault  should continue to see his primary care doctor for the maintenance of cardiovascular and metabolic health  Reversible cognitive impairment serologies:   Vitamin B12, TSH, thiamine   Structural neuroimaging with brain MRI w/o contrast, dementia protocol   {Neurodegenerative disease testing:48215}  Start medication: ***  Referral(s): ***  *** I provided in the patient instruction of list of resources for patients and caregivers with cognitive impairment.    Brain Health Lifestyle Factors Provided:    ***Healthy brain aging can be encouraged by regular exercise, socialization, a proper diet and quality sleep.  In the clinical trials that prove the value of activities promote healthy brain aging, patients went to a senior center where they socialized and walked 45 minutes a day, five days a week and adhered to either a Mediterranean diet or MIND diet.  Extensive written instructions were provided to the patient.  Disposition: I would like to see Ricky Thomas in followup after the consensus conference.  Contact me using my chart if you have further questions or new problems develop.  If the problems worsen please call the clinic.    Donato Schultz, MD  Behavioral Neurologist & Neuropsychiatrist   Division Chief, Cognitive Neurology  Assistant Professor of Neurology  [facility], [facility]       Subjective:     Chief Concern:  Primary Informant: ***  Reason if patient is not primary informant: ***  Accompanied by: ***  Educational attainment: {Highest Grade Finished:34886}  Handedness: {NS HANDEDNESS (AMB):[phone]}    HPI:    Symptom(s) onset: ***  Presenting symptom(s): ***  Tempo: ***    Behavioral Neurology & Neuropsychiatry ROS:    I. Social Behavior/Cognition:   Personality changes:  ***  Stereotypies: ***  Changes in dietary preferences: ***  Obsessive/compulsive tendencies: ***  Social decorum: No loss of social graces  Hyperorality: ***    II.Sleep:  -Bedtime: ***  -Awake: ***  -RBD manifestations: ***  -Sleep apnea: {YES NO ***:31156}    III. Neuropsychiatric symptoms:   Depressive features: {Symptoms; depression:1002}    Anxiety: {Symptoms; anxiety:15334::"***"}    Suicidality: {[address] AMB SUICIDE ASSESSMENT:48127}    Manic features: {Symptoms (Manic):44815::"***"}    Abnormal Perceptions:  {Psychosis:33852}    IV. Autonomic:  -orthostatic intolerance: No lightheadedness nor disequilibrium when changing positions: ***  -Balance: {ASSISTIVE DEVICE (PT):25800}   -Falls: {None:33079}  -Appetite: {APPETITE- JXBJ:47829562}  -Deglutition: {Patient Swallowing:47694}    V. Sensory Deprivation Screen:  -Vision: {Correction:42133}  -Hearing: {hearing aids:18520::"Patient does not have hearing aids at this time."}  -Olfaction: ***    VI. Functionality:  ADLs/Care Issues:    -Driving: {ZHYQMVH:84696295}  -Managing Finances:  { :22539}  -Shopping:{ :22418}  -Housekeeping: { :22418}  -Medications:{ :23675}  -G[employer] Sk[employer]: ***  -Scam resistance: ***  -Technology usage: { :22418}    ADLs:  -Bathing:{BATH:50020674}  -Dressing: {WHL PED OCC THER DRESSING SKILLS:210150119}  -Toileting: { :22418}  -Urinary continence: {Continence:42130}  -Fecal continence:  {Continence:42130}    -Living Situation: {LIVING ARRANGEMENTS:500300204::"***"}    VII. Neuropsychiatry history:  History of TBIs: {HEAD TRAUMA IS IS NOT PRESENT (AMB):[phone]}  Cerebrovascular disease: {Stroke history yes/no:31115}  Previously diagnosed psychiatric disease(s): ***  Previous psychiatric therapies/medications: ***  Neurodevelopmental disabilities: ***    VIII. Family history:   Cognitive impairment/Dementia: ***  Parkinsonism: ***  ALS: ***    IX. Social history:    -Marital status: {MARITAL STATUS (AMB):18919}    -Employment/Occupation:  {Occupation:17456}    -Tobacco: {Findings; intake alcohol/tobacco/caffeine:18458}    -Alcohol:   {Findings; intake alcohol/tobacco/caffeine:18458}    -Non-alcohol substance abuse: ***      Past Medical Hx:  No past medical history on file.     Past Surgical Hx:  No past surgical history on file.     MEDICATIONS:    Prior to Admission medications    Not on File       ALLERGIES:  Not on File    Family Hx:    Family Medical History:    None           Social Hx: ***        Review of systems:  ***    Objective:     There were no vitals filed for this visit.    Physical Exam:   General: Normally developed, well-nourished appearing, not in distress    Neurobehavioral Exam:    Cognitive Screening Measure Performed During Encounter: MMSE  MMSE:    Orientation to Time: {NUMBERS; 0-5:20445}  Orientation to Place: {NUMBERS; 0-5:20445}  Registration: {NUMBERS; 0-5:20445}  Attention/Calculation: {NUMBERS; 0-5:20445}  Recall:   Language:  Naming: {NUMBERS; 0-5:20445}  Repetition:{NUMBERS; 2-8:41324}  Comprehension: {NUMBERS; 0-5:20445}  Written command:{NUMBERS; 4-0:10272}  Orthography: {NUMBERS; 0-5:20445}  7. Copying: {NUMBERS; 0-5:20445}    Total score: {Numbers; 0-30:31392}/30      Appearance: Groomed, season-appropriate    Eye contact: {EYE CONTACT BMED (AMB):[phone]}  Affect: {DESC; ZDGUYQ:03474}  Thought content: {thought content csc:36127::"coherent","congruent","consistent with  mood"}  Thought process: {Thought Process/Form:210240081}    Executive attributes:   Insight: {JGP MSE JUDGEMENT/INSIGHT:36046}  Judgement: {JGP MSE JUDGEMENT/INSIGHT:36046}    Executive operations:  -response inhibition (conflicting responses): ***  -motor sequencing with Luria: ***  -Perseverations?: ***  -Stimulus-boundedness?: ***    Language:   Conversational speech fluent and prosodic without paraphasic errors   -Native language: American English   -Secondary language(s): {LANGUAGES:50024262}    Praxes:    Transitive:  -upper  extremity:{jmpraxis:48437}  -Orofacial/buccal:  {jmpraxis:48437}    Intransitive:  -upper extremity:  {jmpraxis:48437}  -Orofacial/buccal:   {jmpraxis:48437}    Visuoperceptual:  -Body topography: able to identify left/right   -Construction: correctly copied cross figure    Elemental Neurological Exam:  Cranial Nerves:     Ophthalmoscopic exam: Normal fundus with intact retinal vasculature.   CN III, IV, VI: PERRLA. EOMI without nystagmus, ophthalmoplegia. No saccadic intrusions. Optokinetic nystagmus preserved.   CN V: Sensation intact in the V1-V3 trigeminal nerve distributions   CN VII: Face symmetric   CN IX, X: Symmetrical palatal elevation   CN XI: Intact shoulder shrug bilaterally  CN XII: Tongue protrudes at midline without deviation. No fasciculations at rest.     Motor:  Muscle bulk: Normal without fasciculations  Muscle tone: Normotonus   Strength: moves all extremities antigravity, no pronator drift   Extrapyramidal: No adventitious movements (i.e. no tics, dystonia, choreoathetoid, myoclonus). Could rise from seated position with arms crossed over chest.     Reflexes: 2+ and symmetrical throughout ***  -Ankle clonus: absent   -Primitive reflexes: grasp, palmomental, glabellar not elicited  -Plantar response: flexor bilaterally    Coordination: Finger to nose intact without dysmetria.     Gait: Normal stance and stride. Romberg sign absent. Retropulsion test score: {NUMBERS; 4-4:01027}    Data:    The patient had the following evaluation(s) that I personally reviewed on June 20, 2023    -Structural neuroimaging: ***    -Functional neuroimaging:     -Vitamin B12: ***    -TSH: ***    -Neuropsych testing: ***      Laboratory testing:    CBC Diff   No results found for: "WBC", "WBCJ", "HGB", "HCT", "PLTCNT", "SEDRATE", "RBC", "MCV", "MCHC", "MCH", "RDW", "MPV" No results found for: "PMNS", "LYMPHOCYTES", "EOSINOPHIL", "MONOCYTES", "BASOPHILS", "PMNABS", "LYMPHSABS", "EOSABS", "MONOSABS", "BASOSABS"      Comprehensive Metabolic Profile   No results found for: "SODIUM", "POTASSIUM", "CHLORIDE", "CO2", "ANIONGAP", "BUN", "CREATININE", "GLUCOSE" No results found for: "CALCIUM", "PHOSPHORUS", "ALB", "ALBUMIN", "TOTALPROTEIN", "ALKPHOS", "AST", "ALT", "TOTBILIRUBIN"     No results found for: "TSH", "RPR", "VITB12"

## 2023-06-20 NOTE — Telephone Encounter (Signed)
medicare/[employer] NPR sent to [facility] [phone] This is for both the MRI brain orders that need done in August and September for medication.

## 2023-06-21 ENCOUNTER — Ambulatory Visit (INDEPENDENT_AMBULATORY_CARE_PROVIDER_SITE_OTHER): Payer: Self-pay | Admitting: Student in an Organized Health Care Education/Training Program

## 2023-07-01 ENCOUNTER — Encounter: Payer: Self-pay | Admitting: Cardiovascular Disease

## 2023-07-02 ENCOUNTER — Other Ambulatory Visit: Payer: Self-pay | Admitting: Cardiovascular Disease

## 2023-07-02 ENCOUNTER — Encounter: Payer: Self-pay | Admitting: Neurology

## 2023-07-03 MED ORDER — METOPROLOL SUCCINATE ER 25 MG PO TB24: ORAL_TABLET | ORAL | 3 refills | Status: DC

## 2023-07-15 IMAGING — CT CTA CHEST W/ AND/OR W/O CM W/ OR W/O DISSECTION AND GATING
1 of 9 series · 2 of 16 positions shown, 3 images · IV contrast (APPLIED)
Comparison: 01/10/2019 and previous

CLINICAL DATA: Thoracic aortic aneurysm, follow-up

EXAM:
CT ANGIOGRAPHY CHEST WITH CONTRAST
TECHNIQUE: Multidetector CT imaging of the chest was performed using the
standard protocol during bolus administration of intravenous
contrast. Multiplanar CT image reconstructions and MIPs were
obtained to evaluate the vascular anatomy.
CONTRAST:  75mL OMNIPAQUE IOHEXOL 350 MG/ML SOLN

[Series 9: arterial thins · axial · arterial · 0.77mm/px · z∈[-216,-116]mm · 2 of 502 slices shown, 3 images]
[im 168/502  soft-tissue]
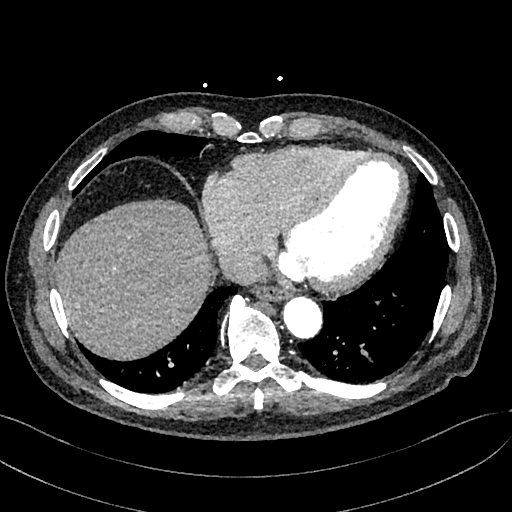
[im 168/502  bone]
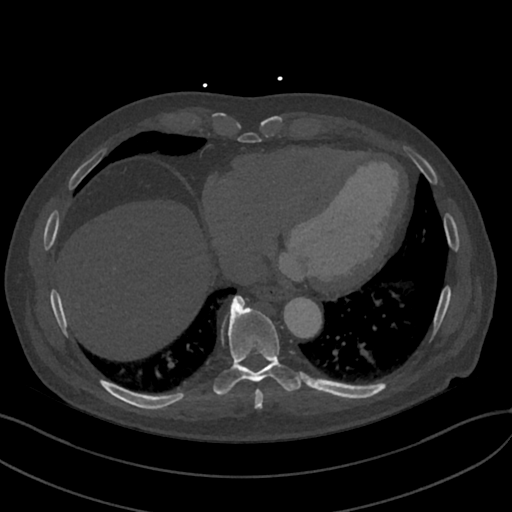
[im 335/502  soft-tissue]
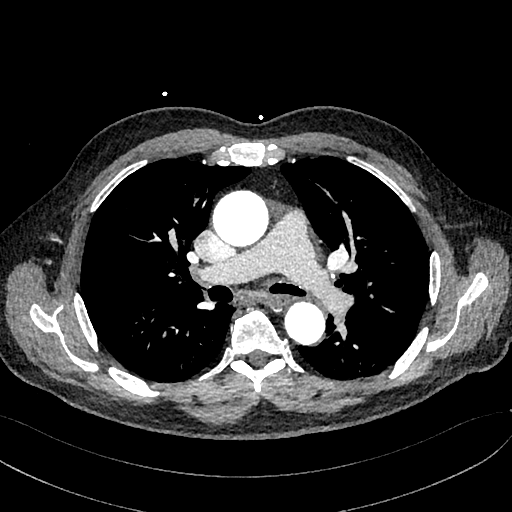

[2 of 16 positions shown; findings below may reference images not displayed]

FINDINGS: Cardiovascular: Heart size upper limits normal. No pericardial
effusion. Fair contrast opacification of the pulmonary arterial
tree; the exam was not optimized for detection of pulmonary emboli.
Patient breathing during the acquisition degrades some images
through the lung bases. Scattered coronary calcifications. Good
contrast opacification of the thoracic aorta. No evidence of
dissection or stenosis. Classic 3 vessel brachiocephalic arterial
origin anatomy without proximal stenosis.

Aortic Root:

--Valve: 3.9 cm

--Sinuses: 6 cm

--Sinotubular Junction: 3.8 cm

Limitations by motion: Moderate

Thoracic Aorta:

--Ascending Aorta: 4.4 cm (previously 4.5)

--Aortic Arch: 4.2 cm

--Descending Aorta: 3.4 cm

Minimal calcified plaque in the distal descending thoracic aorta.
Visualized proximal abdominal aorta unremarkable.

Mediastinum/Nodes: Calcified left hilar and AP window lymph nodes.
No mediastinal mass or hematoma.

Lungs/Pleura: No pleural effusion. No pneumothorax. Minimal
dependent atelectasis posteriorly in the lower lobes. Subcentimeter
calcified granuloma in the left upper lobe. Lungs otherwise clear.

Upper Abdomen: Bilateral renal angiomyolipomas, incompletely
visualized, grossly stable compared to scans dating back to
06/14/2016. Stable right hepatic lobe benign hemangiomas. No acute
findings.

Musculoskeletal: Anterior vertebral endplate spurring at multiple
levels in the mid thoracic spine. No fracture or worrisome bone
lesion.

Review of the MIP images confirms the above findings.
IMPRESSION: 1. Dilated aortic root and thoracic aorta aneurysm as above, without
significant change from previous exam. Recommend semi-annual imaging
followup by CTA or MRA and referral to cardiothoracic surgery if not
already obtained. This recommendation follows 9484
ACCF/AHA/AATS/ACR/ASA/SCA/FARAK/LU PREPELICA OPUZEN/NGHIA/PRIMINTELA Guidelines for the
Diagnosis and Management of Patients With Thoracic Aortic Disease.
Circulation. 9484; 121: [id number]
2. Coronary calcifications. The severity of coronary artery disease
and any potential stenosis cannot be assessed on this non-gated CT
examination. Assessment for potential risk factor modification,
dietary therapy or pharmacologic therapy may be warranted, if
clinically indicated.

## 2023-07-20 ENCOUNTER — Ambulatory Visit
Admission: RE | Admit: 2023-07-20 | Discharge: 2023-07-20 | Disposition: A | Discharge status: Home / Self Care | Payer: Medicare Other | Source: Ambulatory Visit | Attending: Neurology | Admitting: Neurology

## 2023-07-20 DIAGNOSIS — F02A0 Dementia in other diseases classified elsewhere, mild, without behavioral disturbance, psychotic disturbance, mood disturbance, and anxiety: Secondary | ICD-10-CM

## 2023-07-20 DIAGNOSIS — G301 Alzheimer's disease with late onset: Secondary | ICD-10-CM | POA: Diagnosis not present

## 2023-07-24 ENCOUNTER — Encounter: Payer: Self-pay | Admitting: Neurology

## 2023-08-22 ENCOUNTER — Ambulatory Visit
Admission: RE | Admit: 2023-08-22 | Discharge: 2023-08-22 | Disposition: A | Discharge status: Home / Self Care | Payer: Medicare Other | Source: Ambulatory Visit | Attending: Neurology | Admitting: Neurology

## 2023-08-22 DIAGNOSIS — G301 Alzheimer's disease with late onset: Secondary | ICD-10-CM | POA: Diagnosis not present

## 2023-08-22 DIAGNOSIS — F02A0 Dementia in other diseases classified elsewhere, mild, without behavioral disturbance, psychotic disturbance, mood disturbance, and anxiety: Secondary | ICD-10-CM

## 2023-08-31 ENCOUNTER — Other Ambulatory Visit: Payer: Self-pay | Admitting: Internal Medicine

## 2023-08-31 ENCOUNTER — Other Ambulatory Visit: Payer: Self-pay

## 2023-09-21 ENCOUNTER — Ambulatory Visit: Payer: Medicare Other

## 2023-09-21 VITALS — BP 118/80 | HR 65 | Temp 97.6°F | Ht 76.0 in | Wt 218.0 lb

## 2023-09-21 DIAGNOSIS — Z0000 Encounter for general adult medical examination without abnormal findings: Secondary | ICD-10-CM

## 2023-09-21 NOTE — Progress Notes (Signed)
Subjective:   Dustin Berry. is a 77 y.o. male who presents for Medicare Annual/Subsequent preventive examination.  Visit Complete: In person  Cardiac Risk Factors include: advanced age (>58men, >52 women);sedentary lifestyle;dyslipidemia;family history of premature cardiovascular disease;hypertension;male gender     Objective:    Today's Vitals   09/21/23 1304  BP: 118/80  Pulse: 65  Temp: 97.6 F (36.4 C)  TempSrc: Temporal  SpO2: 97%  Weight: 218 lb (98.9 kg)  Height: 6\' 4"  (1.93 m)  PainSc: 2   PainLoc: Back   Body mass index is 26.54 kg/m.     09/21/2023    1:58 PM 02/19/2018    8:52 PM 06/20/2017    8:31 AM 06/05/2015    3:13 PM 04/03/2013    7:00 AM 04/01/2013   11:52 PM  Advanced Directives  Does Patient Have a Medical Advance Directive? Yes Yes No No Patient does not have advance directive;Patient would not like information Patient does not have advance directive;Patient would not like information  Type of Public librarian Power of [place];Living will       Does patient want to make changes to medical advance directive?  No - Patient declined      Copy of Healthcare Power of Attorney in Chart? No - copy requested       Would patient like information on creating a medical advance directive?    No - patient declined information    Pre-existing out of facility DNR order (yellow form or pink MOST form)     No     Current Medications (verified) Outpatient Encounter Medications as of 09/21/2023  Medication Sig   aspirin 81 MG EC tablet Take 1 tablet (81 mg total) by mouth daily. Swallow whole.   atorvastatin (LIPITOR) 80 MG tablet TAKE 1 TABLET BY MOUTH EVERY DAY   cyanocobalamin (VITAMIN B12) 100 MCG tablet Take 100 mcg by mouth daily.   lisinopril (ZESTRIL) 5 MG tablet Take 1 tablet (5 mg total) by mouth daily.   meloxicam (MOBIC) 15 MG tablet Take 15 mg by mouth as needed for pain. TAKE 0.5-1 tablet  AS NEEDED   metoprolol succinate  (TOPROL-XL) 25 MG 24 hr tablet Take 2 tablets (50 mg total) by mouth every morning AND 0.5 tablets (12.5 mg total) every evening.   rivastigmine (EXELON) 3 MG capsule Take 1 capsule (3 mg total) by mouth 2 (two) times daily.   tamsulosin (FLOMAX) 0.4 MG CAPS capsule TAKE 1 CAPSULE BY MOUTH EVERY DAY   VITAMIN D PO Take 1 tablet by mouth daily in the afternoon.   zolpidem (AMBIEN) 5 MG tablet TAKE 1 TABLET BY MOUTH AT BEDTIME AS NEEDED FOR SLEEP.   b complex vitamins capsule Take 1 capsule by mouth daily. (Patient not taking: Reported on 09/21/2023)   No facility-administered encounter medications on file as of 09/21/2023.    Allergies (verified) Penicillin g, Quinolones, and Amoxil [amoxicillin]   History: Past Medical History:  Diagnosis Date   Abdominal pain 04/01/2013   Acute bronchitis 03/07/2018   Allergic rhinitis 05/12/2008   Aortic atherosclerosis 08/23/2020   Arthritis    Ascending aortic aneurysm 12/13/2018   Atrial fibrillation with RVR 10/21/2012   Echo- EF 50-55%; mild concentric LVH; flow pattern suggestive of impaired LV relaxation; mild mitral valve prolapse, trace mitral regurgitation   Back pain    receiving PT   Benign prostatic hyperplasia 09/05/2012   Boil of buttock 06/24/2011   Bradycardia 04/17/2013   Cataract  Cellulitis of knee, left 04/12/2016   Chest pain with exertion presumed to be tachycardia related along with SOB 04/01/2013   Degeneration of lumbar intervertebral disc 12/10/2020   Eustachian tube dysfunction, bilateral 03/07/2018   External otitis of left ear 06/11/2020   Fatigue 07/04/2011   GERD (gastroesophageal reflux disease) 03/24/2009   Heart murmur    Hematuria, possible 04/01/2013   Hereditary and idiopathic peripheral neuropathy 05/12/2008   HLD (hyperlipidemia) 08/18/2007   HTN (hypertension) 12/04/2013   Insomnia 06/08/2016   Left knee pain 12/13/2018   Low back pain 12/01/2020   Lumbar spondylosis 05/05/2021   Mild  cognitive impairment of uncertain or unknown etiology 12/09/2022   Mitral valve prolapse 07/04/2011   MRSA (methicillin resistant staph aureus) culture positive 5+ years ago   Neck pain 05/05/2021   OSA (obstructive sleep apnea) 07/04/2011   Using CPAP nightly   Pain in right hand 02/04/2020   Palpitations 03/06/2007   R/P MV - mild perfusion defect in basal inferoseptal, basal inferior, mid inferoseptal, and mid inferior regions, consistent w/ infarct/scar; no scintigraphic evidence of inducible myocardial ischemia; prior non transmural infarct cannot be completely excluded; EF 48%; no significant change from previous study   Personal history of colonic polyps 05/12/2008   Radicular pain in left arm 05/05/2021   Sacroiliitis 05/05/2021   Stricture and stenosis of esophagus 03/24/2009   TIA (transient ischemic attack) 11/22/2021   Tinnitus 09/05/2012   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   Vitamin B12 deficiency 06/15/2019   Vitamin D deficiency 05/23/2022   Past Surgical History:  Procedure Laterality Date   ATRIAL FIBRILLATION ABLATION     CARDIOVERSION N/A 04/03/2013   Procedure: CARDIOVERSION;  Surgeon: Chrystie Nose, MD;  Location: [facility] ENDOSCOPY;  Service: Cardiovascular;  Laterality: N/A;   COLONOSCOPY  2018   HAND SURGERY     Thumb joint repair   POLYPECTOMY     TEE WITHOUT CARDIOVERSION N/A 04/03/2013   Procedure: TRANSESOPHAGEAL ECHOCARDIOGRAM (TEE);  Surgeon: Chrystie Nose, MD;  Location: [facility] ENDOSCOPY;  Service: Cardiovascular;  Laterality: N/A;   TONSILLECTOMY AND ADENOIDECTOMY     UPPER GASTROINTESTINAL ENDOSCOPY     dilation   Family History  Problem Relation Age of Onset   Dementia Mother        late 46s   Alzheimer's disease Mother    Heart disease Father 77       died with MI   Melanoma Sister    Breast cancer Maternal Grandmother    Stroke Maternal Grandfather    Colon cancer Neg Hx    Esophageal cancer Neg Hx    Stomach cancer Neg Hx    Rectal cancer Neg  Hx    Colon polyps Neg Hx    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  Tobacco Use   Smoking status: Never   Smokeless tobacco: Never  Vaping Use   Vaping status: Never Used  Substance and Sexual Activity   Alcohol use: Not Currently    Alcohol/week: 0.0 - 1.0 standard drinks of alcohol    Comment: infrequent glass of wine   Drug use: No   Sexual activity: Not on file  Other  Topics Concern   Not on file  Social History Narrative   Not on file   Social Determinants of Health   Financial Resource Strain: Low Risk  (09/21/2023)   Overall Financial Resource Strain (CARDIA)    Difficulty of Paying Living Expenses: Not hard at all  Food Insecurity: No Food Insecurity (09/21/2023)   Hunger Vital Sign    Worried About Running Out of Food in the Last Year: Never true    Ran Out of Food in the Last Year: Never true  Transportation Needs: No Transportation Needs (09/21/2023)   PRAPARE - Administrator, Civil Service (Medical): No    Lack of Transportation (Non-Medical): No  Physical Activity: Inactive (09/21/2023)   Exercise Vital Sign    Days of Exercise per Week: 0 days    Minutes of Exercise per Session: 0 min  Stress: No Stress Concern Present (09/21/2023)   [employer] of Occupational Health - Occupational Stress Questionnaire    Feeling of Stress : Not at all  Social Connections: Unknown (09/21/2023)   Social Connection and Isolation Panel [NHANES]    Frequency of Communication with Friends and Family: More than three times a week    Frequency of Social Gatherings with Friends and Family: More than three times a week    Attends Religious Services: Not on  Marketing executive or Organizations: Yes    Attends Engineer, structural: More than 4 times per year    Marital Status: Married    Tobacco Counseling Counseling given: Not Answered   Clinical Intake:  Pre-visit preparation completed: Yes  Pain : 0-10 Pain Score: 2  Pain Type: Chronic pain Pain Location: Back     BMI - recorded: 26.54 Diabetes: No  How often do you need to have someone help you when you read instructions, pamphlets, or other written materials from your doctor or pharmacy?: 1 - Never What is the last grade level you completed in school?: Bachelor's of Science in Medical Technology  Interpreter Needed?: No  Information entered by :: [employer]. Jennyfer Nickolson, LPN.   Activities of Daily Living    09/21/2023    2:00 PM  In your present state of health, do you have any difficulty performing the following activities:  Hearing? 1  Vision? 0  Difficulty concentrating or making decisions? 1  Walking or climbing stairs? 0  Dressing or bathing? 0  Doing errands, shopping? 0  Preparing Food and eating ? N  Using the Toilet? N  In the past six months, have you accidently leaked urine? N  Do you have problems with loss of bowel control? N  Managing your Medications? N  Managing your Finances? N  Housekeeping or managing your Housekeeping? N    Patient Care Team: Corwin Levins, MD as PCP - General Tresa Endo Clovis Pu, MD as PCP - Cardiology (Cardiology) Lennette Bihari, MD as Consulting Physician (Cardiology) Antony Madura, MD (Cardiology) Sheran Luz, MD as Consulting Physician (Physical Medicine and Rehabilitation) Marzella Schlein., MD as Consulting Physician (Ophthalmology)  Indicate any recent Medical Services you may have received from other than [facility] providers in the past year (date may be approximate).     Assessment:   This is a routine wellness examination for Woods.  Hearing/Vision screen Hearing Screening - Comments::  Patient has hearing difficulty and wears hearing aids. Vision Screening - Comments:: Patient does wear corrective lenses/sunglasses for driving.  Annual eye exam done by: Tasia Catchings  Lucretia Roers, MD.    Goals Addressed             This Visit's Progress    To wake up every morning and get back into [employer].        Depression Screen    09/21/2023    1:57 PM 05/23/2023    9:35 AM 11/21/2022    9:54 AM 08/23/2022    8:41 AM 05/23/2022    1:52 PM 11/22/2021   10:30 AM 08/12/2020    8:27 AM  PHQ 2/9 Scores  PHQ - 2 Score 0 0 0  0 0 0  PHQ- 9 Score 0  0  0    Exception Documentation    Patient refusal       Fall Risk    09/21/2023    2:00 PM 05/23/2023    9:35 AM 11/21/2022    9:54 AM 08/23/2022    8:41 AM 05/23/2022    1:52 PM  Fall Risk   Falls in the past year? 0 0 0 0 0  Number falls in past yr: 0 0   0  Injury with Fall? 0 0 0 0 0  Risk for fall due to : No Fall Risks No Fall Risks No Fall Risks No Fall Risks   Follow up Falls prevention discussed Falls evaluation completed Falls evaluation completed Falls evaluation completed     MEDICARE RISK AT HOME: Medicare Risk at Home Any stairs in or around the home?: Yes If so, are there any without handrails?: No Home free of loose throw rugs in walkways, pet beds, electrical cords, etc?: Yes Adequate lighting in your home to reduce risk of falls?: Yes Life alert?: No Use of a cane, walker or w/c?: No Grab bars in the bathroom?: No Shower chair or bench in shower?: Yes Elevated toilet seat or a handicapped toilet?: Yes  TIMED UP AND GO:  Was the test performed?  Yes  Length of time to ambulate 10 feet: 8 sec Gait steady and fast without use of assistive device    Cognitive Function:  Cognitive status assessed by direct observation. Patient has current diagnosis of cognitive impairment. Patient is followed by neurology for ongoing assessment. 6CIT not completed during this visit.     09/21/2023    2:00 PM  MMSE - Mini Mental State  Exam  Not completed: Unable to complete      05/03/2023    3:28 PM 03/07/2022   10:21 AM  Montreal Cognitive Assessment   Visuospatial/ Executive (0/5) 5 4  Naming (0/3) 3 2  Attention: Read list of digits (0/2) 2 2  Attention: Read list of letters (0/1) 1 1  Attention: Serial 7 subtraction starting at 100 (0/3) 3 2  Language: Repeat phrase (0/2) 2 1  Language : Fluency (0/1) 1 1  Abstraction (0/2) 2 2  Delayed Recall (0/5) 3 0  Orientation (0/6) 6 6  Total 28 21  Adjusted Score (based on education)  21       Immunizations Immunization History  Administered Date(s) Administered   Influenza Split 09/05/2012   Influenza, High Dose Seasonal PF 09/05/2018, 09/18/2021   Influenza, Quadrivalent, Recombinant, Inj, Pf 09/02/2019   Influenza-Unspecified 11/22/2017, 10/07/2021, 10/14/2021, 10/08/2022   Moderna Covid-19 Fall Seasonal Vaccine 19yrs & older 06/30/2023   Moderna Sars-Covid-2 Vaccination 01/27/2020, 02/24/2020, 10/15/2020, 09/13/2021   Pneumococcal Conjugate-13 04/29/2014   Pneumococcal Polysaccharide-23 09/05/2012   Td 05/14/2009   Tdap 06/12/2019   Zoster Recombinant(Shingrix) 05/05/2018, 07/05/2018   Zoster, Live  07/10/2009    TDAP status: Up to date  Flu Vaccine status: Due, Education has been provided regarding the importance of this vaccine. Advised may receive this vaccine at local pharmacy or Health Dept. Aware to provide a copy of the vaccination record if obtained from local pharmacy or Health Dept. Verbalized acceptance and understanding.  Pneumococcal vaccine status: Up to date  Covid-19 vaccine status: Completed vaccines  Qualifies for Shingles Vaccine? Yes   Zostavax completed Yes   Shingrix Completed?: Yes  Screening Tests Health Maintenance  Topic Date Due   INFLUENZA VACCINE  07/06/2023   Medicare Annual Wellness (AWV)  09/20/2024   Colonoscopy  12/10/2027   DTaP/Tdap/Td (3 - Td or Tdap) 06/11/2029   Pneumonia Vaccine 64+ Years old   Completed   Hepatitis C Screening  Completed   Zoster Vaccines- Shingrix  Completed   HPV VACCINES  Aged Out   COVID-19 Vaccine  Discontinued    Health Maintenance  Health Maintenance Due  Topic Date Due   INFLUENZA VACCINE  07/06/2023    Colorectal cancer screening: Type of screening: Colonoscopy. Completed 12/09/2020. Repeat every 7 years  Lung Cancer Screening: (Low Dose CT Chest recommended if Age 18-80 years, 20 pack-year currently smoking OR have quit w/in 15years.) does not qualify.   Lung Cancer Screening Referral: no  Additional Screening:  Hepatitis C Screening: does qualify; Completed 01/09/2019  Vision Screening: Recommended annual ophthalmology exams for early detection of glaucoma and other disorders of the eye. Is the patient up to date with their annual eye exam?  Yes  Who is the provider or what is the name of the office in which the patient attends annual eye exams? Velna Ochs, MD. If pt is not established with a provider, would they like to be referred to a provider to establish care? No .   Dental Screening: Recommended annual dental exams for proper oral hygiene  Diabetic Foot Exam: N/A  Community Resource Referral / Chronic Care Management: CRR required this visit?  No   CCM required this visit?  No     Plan:     I have personally reviewed and noted the following in the patient's chart:   Medical and social history Use of alcohol, tobacco or illicit drugs  Current medications and supplements including opioid prescriptions. Patient is not currently taking opioid prescriptions. Functional ability and status Nutritional status Physical activity Advanced directives List of other physicians Hospitalizations, surgeries, and ER visits in previous 12 months Vitals Screenings to include cognitive, depression, and falls Referrals and appointments  In addition, I have reviewed and discussed with patient certain preventive protocols, quality metrics,  and best practice recommendations. A written personalized care plan for preventive services as well as general preventive health recommendations were provided to patient.     Mickeal Needy, LPN   [id number]   After Visit Summary: (In Person-Printed) AVS printed and given to the patient  Nurse Notes: N/A

## 2023-09-21 NOTE — Patient Instructions (Signed)
Dustin Berry , Thank you for taking time to come for your Medicare Wellness Visit. I appreciate your ongoing commitment to your health goals. Please review the following plan we discussed and let me know if I can assist you in the future.   Referrals/Orders/Follow-Ups/Clinician Recommendations: No  This is a list of the screening recommended for you and due dates:  Health Maintenance  Topic Date Due   Medicare Annual Wellness Visit  06/04/2016   Flu Shot  07/06/2023   Colon Cancer Screening  12/10/2027   DTaP/Tdap/Td vaccine (3 - Td or Tdap) 06/11/2029   Pneumonia Vaccine  Completed   Hepatitis C Screening  Completed   Zoster (Shingles) Vaccine  Completed   HPV Vaccine  Aged Out   COVID-19 Vaccine  Discontinued   Conditions/risks identified: Yes; Reviewed health maintenance screenings with patient today and relevant education, vaccines, and/or referrals were provided. Please continue to do your personal lifestyle choices by: daily care of teeth and gums, regular physical activity (goal should be 5 days a week for 30 minutes), eat a healthy diet, avoid tobacco and drug use, limiting any alcohol intake, taking a low-dose aspirin (if not allergic or have been advised by your provider otherwise) and taking vitamins and minerals as recommended by your provider. Continue doing brain stimulating activities (puzzles, reading, adult coloring books, staying active) to keep memory sharp. Continue to eat heart healthy diet (full of fruits, vegetables, whole grains, lean protein, water--limit salt, fat, and sugar intake) and increase physical activity as tolerated.  Advanced directives: (Copy Requested) Please bring a copy of your health care power of attorney and living will to the office to be added to your chart at your convenience.  Next Medicare Annual Wellness Visit scheduled for next year: No, please schedule your next AWV after September 20, 2024 by calling [phone].

## 2023-09-27 ENCOUNTER — Encounter: Payer: Self-pay | Admitting: Neurology

## 2023-09-28 MED ORDER — RIVASTIGMINE TARTRATE 3 MG PO CAPS: 3.0000 mg | ORAL_CAPSULE | Freq: Two times a day (BID) | ORAL | 3 refills | Status: AC

## 2023-10-26 ENCOUNTER — Telehealth: Payer: Self-pay | Admitting: Neurology

## 2023-10-26 ENCOUNTER — Other Ambulatory Visit: Payer: Self-pay | Admitting: Neurology

## 2023-10-26 DIAGNOSIS — G309 Alzheimer's disease, unspecified: Secondary | ICD-10-CM

## 2023-10-26 NOTE — Telephone Encounter (Signed)
medicare/AARP NPR sent to GI to schedule between 12/19 and 1/13  [phone]

## 2023-11-21 ENCOUNTER — Encounter: Payer: Self-pay | Admitting: Cardiovascular Disease

## 2023-11-21 ENCOUNTER — Ambulatory Visit: Payer: Medicare Other | Attending: Cardiovascular Disease | Admitting: Cardiovascular Disease

## 2023-11-21 DIAGNOSIS — I77810 Thoracic aortic ectasia: Secondary | ICD-10-CM | POA: Diagnosis present

## 2023-11-21 DIAGNOSIS — I4891 Unspecified atrial fibrillation: Secondary | ICD-10-CM

## 2023-11-21 DIAGNOSIS — I7121 Aneurysm of the ascending aorta, without rupture: Secondary | ICD-10-CM | POA: Insufficient documentation

## 2023-11-21 DIAGNOSIS — I341 Nonrheumatic mitral (valve) prolapse: Secondary | ICD-10-CM | POA: Diagnosis present

## 2023-11-21 DIAGNOSIS — I10 Essential (primary) hypertension: Secondary | ICD-10-CM | POA: Diagnosis present

## 2023-11-21 DIAGNOSIS — E785 Hyperlipidemia, unspecified: Secondary | ICD-10-CM | POA: Diagnosis present

## 2023-11-21 DIAGNOSIS — I480 Paroxysmal atrial fibrillation: Secondary | ICD-10-CM | POA: Insufficient documentation

## 2023-11-21 DIAGNOSIS — G309 Alzheimer's disease, unspecified: Secondary | ICD-10-CM | POA: Diagnosis present

## 2023-11-21 DIAGNOSIS — F0280 Dementia in other diseases classified elsewhere without behavioral disturbance: Secondary | ICD-10-CM | POA: Diagnosis present

## 2023-11-21 NOTE — Progress Notes (Unsigned)
Patient ID: Dustin Berry., male   DOB: [birth year], 77 y.o.   MRN: [id number]      HPI: Dustin Berry. is a 77 y.o. male who presents for an 16 month followup cardiology evaluation.    [name] has a long-standing history of paroxysmal atrial fibrillation. Initially in 2000 he was evaluated at [facility] and was treated at that time with flecainide. He subsequently developed breakthrough arrhythmia's and had done well with Rythmol SR and can, beta blocker. Remotely, he had seen Dr. Sharol Harness at [facility] as well as Dr. Jean Rosenthal at [facility] for consideration of atrial fibrillation ablation in 2011 at that time a decision not to have therapy. He had done well until recently but developed recurrent problems with recurrent atrial fibrillation despite medical therapy. He also has been diagnosed with obstructive sleep apnea and has been utilizing CPAP therapy with 100% compliance. He ultimately went back to [facility] and on June 6,2014 underwent pulmonary vein isolation of all pulmonary veins and radiofrequency ablation of CTI with bidirectional block noted with RA and CS pacing by Dr. Jean Rosenthal. He has been on eliquis 5 mg twice a day for anticoagulation. At that time he was taken off his multaq and metoprolol.  When I saw him in 2014 he started to notice white blood pressure elevation. He was unaware of any recurrent atrial fibrillation. He had been using his CPAP therapy. At that time, his blood pressure was 130/100. I elected to add low-dose lisinopril at 5 mg per day socially underwent a 2-D echo Doppler study on 11/21/2013. This showed an ejection fraction in the 45-50% range with grade 1 diastolic dysfunction. No definitive wall motion abnormalities were detected although the possibility was raised concerning possible mid inferior hypocontractility. He did have systolic bowing without definitive prolapse of his mitral valve with mild MR.   He underwent a one-year follow-up evaluation at [facility]  for his atrial fibrillation ablation.  He was maintaining sinus rhythm.  At that time, his anticoagulation was discontinued and he was resumed on baby aspirin for antiplatelet therapy.  He apparently has stopped using CPAP therapy over the past 3 years.  He denies any issues with his sleep.  He is unaware of any significant recurrent palpitations.  He has been monitoring his blood pressure at home and he states this is typically been running approximately 130 systolically.  He denies chest pain or palpitations.  He has back issues which has limited his exercise such that he predominantly just walks.  He no longer plays basketball.  When I saw him in February 2019 he was in the process of selling his house, and he is building a new house.  In the interim he was notusing his CPAP and that this is in a box preparing for his move.  He also has moved his [name] out of [employer] to the [place] region.  He is unaware of any recurrent atrial fibrillation.  Occasionally he admitted to palpitations which he senses particularly when he lies on his side.  He underwent a follow-up echo Doppler study in February 2018 which showed an EF of 50-55% with grade 1 diastolic dysfunction.  His aortic root was mildly dilated and measured 4.5 cm.  There was mitral valve prolapse involving the anterior leaflet and posterior leaflet with mild MR.  There was mild biatrial enlargement.  In follow-up of his aortic root increase he underwent CT angios of his chest and aorta which showed a 4.3  cm a sending thoracic aortic aneurysm on 03/02/2017.  He was on lisinopril 5 mg for hypertension, atorvastatin 20 for hyperlipidemia.    I saw him on 08/05/2019 at which time he had remained stable from a cardiac perspective.  He was unaware of any recurrent arrhythmia.  He denied any chest pain. He had noticed his blood pressures slightly elevated and labile with typical blood pressures around 140 systolically.  He is unaware of any  breakthrough atrial fibrillation.    He underwent laboratory with Dr. Jonny Ruiz in February 2020.  Total cholesterol was 149, HDL 40.9, LDL 95, triglycerides 67.  When I last saw him, I recommended titration of lisinopril and apparently subsequent to that evaluation his dose was further titrated to 10 mg daily.  I was saw him in October 2021 and over the prior year he had remained stable from a cardiac standpoint.  He was unaware of any recurrent atrial fibrillation.  However there have been times we has noticed some slight increase in palpitations particularly if he would drink 2 to 3 glasses of caffeinated tea.  With caffeine reduction these have improved.  Recently, he has been trying to eat better.  He continued to work approximately 5 and half days per week at his [employer] which has moved to the [employer] area.  He typically walks about [phone] steps per day usually while at work but denies any significant routine exercise.  He does cut his grass and work in his yard on weekends.  Over the past year he underwent right thumb surgery by Dr. Amanda Pea.  He denied chest pain or change in exertional symptomatology.  He saw Dr. Jonny Ruiz for his primary care evaluation but no laboratory was obtained this year.  His last blood work was done in February 2020.    I saw him on June 01, 2021.  He continued to work at his [employer].  He has been having some issues with his SI joint and is recently been evaluated by Dr. Theotis Burrow at [facility].  At times he notes an occasional isolated palpitation but at times he may notice brief burst of heart rate irregularity.  He has been taking metoprolol succinate 25 mg daily as well as lisinopril 10 mg for hypertension.  He has been taking meloxicam for his arthritic issues.  He continues to be on atorvastatin 40 mg daily.  He denies any chest pain PND orthopnea.  He denies any shortness of breath.  His blood pressure at home has been running in the upper 130s to 140 range.  At  his evaluation, I recommended that he undergo a follow-up echo Doppler study.  I also titrated his metoprolol from 25 mg daily to 37.5 mg for the next 2 weeks and depending upon blood pressure and heart rate response increased to 50 mg if necessary.  He underwent an echo Doppler study on June 28, 2021.  He was found to have dilated aortic root and thoracic aortic aneurysm.  EF by 3D volume was 46%.  There was grade 1 diastolic dysfunction.  It was recommended that he undergo ECG gated CTA of his aorta for further evaluation of his aortic ascending aortic aneurysm with particular attention to the sinus of Valsalva.  I recommended that he see Dr. Evelene Croon for initial evaluation.  Apparently on that particular day Dr. Laneta Simmers was unable to be in the office and he was seen by PA.  He subsequently was evaluated at [facility] by Dr. Italy  Bing Plume.  And had a follow-up 2D echo Doppler study done in April 2023 which showed an EF at 50%.  Left atrium was 4.3 cm.  There was mild mitral regurgitation mild aortic regurgitation.  Ascending aorta measured 4.6 cm.  He is scheduled to have follow-up with him in 1 year.  Dustin Berry states his blood pressure at home has been in the 120/80 range.  At times he has noticed some palpitations and was wondering if he had gone back into atrial fibrillation.  Upon further questioning today, it may not be that he went into atrial fibrillation but perhaps may have had's premature beats.  He was evaluated by Dr. Jonny Ruiz on June 19 following an episode of palpitations Sunday night and that morning.  Apparently he was in sinus rhythm at the time.  He underwent laboratory and is to undergo a 2-week cardiac monitoring.  I last saw him on May 25, 2022.  At that time he felt well and denied any chest pain or exertional dyspnea.   He has been on baby aspirin and apparently remotely his anticoagulation was discontinued.  He continues to be on metoprolol succinate 50 mg daily as well as  atorvastatin 80 mg.  He takes Lyrica at bedtime and is also on lisinopril 10 mg daily.  During that evaluation, he had noticed palpitations for several days for which she subsequently saw Dr. Jonny Ruiz.  A Zio patch monitor was ordered.  I last saw him on December 12, 2022.  Since I last saw him, he has done relatively well.  He is still working at his [employer] in [place] shopping area.  He has a follow-up appointment to see Dr. Italy Hughes at [facility] this spring in follow-up of his ascending aortic dilatation previously measured at 4.6 cm.  For Zio patch monitor which showed predominant rhythm its normal sinus rhythm at 61 bpm.  He had 1 short episode of nonsustained VT lasting 4 beats with a heart rate range at 119 and 143.  There were 57 short bursts of SVT with the fastest interval at 164 lasting 4 beats and the longest interval at 14 beats at an average rate at 143.  He had an episode of ventricular trigeminy lasting 29.5 seconds. He denies chest pain or shortness of breath.  He may still note an occasional palpitation.  He denies presyncope or syncope.    Presently, Dustin Berry feels well.  He is now working somewhat less since his wife had retired and she is spending more time at the [employer].  He is scheduled to see Dr. Oliver Barre for primary care tomorrow and complete laboratory will be obtained.  Occasionally, he has forgotten to take his metoprolol and when this occurs he does note some recurrence of palpitations.  However when he takes the metoprolol succinate 50 mg in the morning and 12.5 mg in the evening along with his lisinopril 5 mg blood pressure and heart rate are well-controlled.  He is on atorvastatin 80 mg for hyperlipidemia.  He was recently started on Exelon for memory issues and is now being followed by Dr. Teresa Coombs of neurology and is felt to have mild Alzheimer's disease.  He continues to go to [facility] every April for follow-up evaluation with Dr. Kizzie Bane.  He did undergo repeat  cardiac MRI chest angiogram and MRI of his heart morphology and function April 2024.  Sinus of Valsalva largest dimension was 4.8 x 4.7 x 4.6 cm.  Left ventricle was normal  in cavity size and wall thickness with low normal EF at 50% and questionable mid septal wall hypokinesis.  The right ventricle was normal in size thickness and function.  Right atrium was moderately enlarged, left atrium was mildly dilated.  Aortic valve was trileaflet.  There was mild tricuspid regurgitation.  He presents for evaluation.   Past Medical History:  Diagnosis Date   Abdominal pain 04/01/2013   Acute bronchitis 03/07/2018   Allergic rhinitis 05/12/2008   Aortic atherosclerosis 08/23/2020   Arthritis    Ascending aortic aneurysm 12/13/2018   Atrial fibrillation with RVR 10/21/2012   Echo- EF 50-55%; mild concentric LVH; flow pattern suggestive of impaired LV relaxation; mild mitral valve prolapse, trace mitral regurgitation   Back pain    receiving PT   Benign prostatic hyperplasia 09/05/2012   Boil of buttock 06/24/2011   Bradycardia 04/17/2013   Cataract    Cellulitis of knee, left 04/12/2016   Chest pain with exertion presumed to be tachycardia related along with SOB 04/01/2013   Degeneration of lumbar intervertebral disc 12/10/2020   Eustachian tube dysfunction, bilateral 03/07/2018   External otitis of left ear 06/11/2020   Fatigue 07/04/2011   GERD (gastroesophageal reflux disease) 03/24/2009   Heart murmur    Hematuria, possible 04/01/2013   Hereditary and idiopathic peripheral neuropathy 05/12/2008   HLD (hyperlipidemia) 08/18/2007   HTN (hypertension) 12/04/2013   Insomnia 06/08/2016   Left knee pain 12/13/2018   Low back pain 12/01/2020   Lumbar spondylosis 05/05/2021   Mild cognitive impairment of uncertain or unknown etiology 12/09/2022   Mitral valve prolapse 07/04/2011   MRSA (methicillin resistant staph aureus) culture positive 5+ years ago   Neck pain 05/05/2021   OSA (obstructive  sleep apnea) 07/04/2011   Using CPAP nightly   Pain in right hand 02/04/2020   Palpitations 03/06/2007   R/P MV - mild perfusion defect in basal inferoseptal, basal inferior, mid inferoseptal, and mid inferior regions, consistent w/ infarct/scar; no scintigraphic evidence of inducible myocardial ischemia; prior non transmural infarct cannot be completely excluded; EF 48%; no significant change from previous study   Personal history of colonic polyps 05/12/2008   Radicular pain in left arm 05/05/2021   Sacroiliitis 05/05/2021   Stricture and stenosis of esophagus 03/24/2009   TIA (transient ischemic attack) 11/22/2021   Tinnitus 09/05/2012   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   Vitamin B12 deficiency 06/15/2019   Vitamin D deficiency 05/23/2022    Past Surgical History:  Procedure Laterality Date   ATRIAL FIBRILLATION ABLATION     CARDIOVERSION N/A 04/03/2013   Procedure: CARDIOVERSION;  Surgeon: Chrystie Nose, MD;  Location: [facility] ENDOSCOPY;  Service: Cardiovascular;  Laterality: N/A;   COLONOSCOPY  2018   HAND SURGERY     Thumb joint repair   POLYPECTOMY     TEE WITHOUT CARDIOVERSION N/A 04/03/2013   Procedure: TRANSESOPHAGEAL ECHOCARDIOGRAM (TEE);  Surgeon: Chrystie Nose, MD;  Location: [facility] ENDOSCOPY;  Service: Cardiovascular;  Laterality: N/A;   TONSILLECTOMY AND ADENOIDECTOMY     UPPER GASTROINTESTINAL ENDOSCOPY     dilation    Allergies  Allergen Reactions   Penicillin G Rash   Quinolones Other (See Comments)    Other reaction(s): Other (See Comments) Fluroquinolone antibiotics should be avoided in patients with history of aortic aneurysm/dissection   Amoxil [Amoxicillin] Rash    Current Outpatient Medications  Medication Sig Dispense Refill   aspirin 81 MG EC tablet Take 1 tablet (81 mg total) by mouth daily. Swallow whole. 30 tablet 12  cyanocobalamin (VITAMIN B12) 100 MCG tablet Take 100 mcg by mouth daily.     meloxicam (MOBIC) 15 MG tablet Take 15 mg by mouth as  needed for pain. TAKE 0.5-1 tablet  AS NEEDED     metoprolol succinate (TOPROL-XL) 25 MG 24 hr tablet Take 2 tablets (50 mg total) by mouth every morning AND 0.5 tablets (12.5 mg total) every evening. 225 tablet 3   rivastigmine (EXELON) 3 MG capsule Take 1 capsule (3 mg total) by mouth 2 (two) times daily. 180 capsule 3   VITAMIN D PO Take 1 tablet by mouth daily in the afternoon.     zolpidem (AMBIEN) 5 MG tablet TAKE 1 TABLET BY MOUTH AT BEDTIME AS NEEDED FOR SLEEP. 30 tablet 0   atorvastatin (LIPITOR) 80 MG tablet Take 1 tablet (80 mg total) by mouth daily. 90 tablet 3   lisinopril (ZESTRIL) 5 MG tablet Take 1 tablet (5 mg total) by mouth daily. 90 tablet 3   silodosin (RAPAFLO) 8 MG CAPS capsule Take 1 capsule (8 mg total) by mouth daily with breakfast. 90 capsule 3   No current facility-administered medications for this visit.    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  Tobacco Use   Smoking status: Never   Smokeless tobacco: Never  Vaping Use   Vaping status: Never Used  Substance and Sexual Activity   Alcohol use: Not Currently    Alcohol/week: 0.0 - 1.0 standard drinks of alcohol    Comment: infrequent glass of wine   Drug use: No   Sexual activity: Not on file  Other Topics Concern   Not on file  Social History Narrative   Not on file   Social Drivers of Health   Financial Resource Strain: Low Risk  (09/21/2023)   Overall Financial Resource Strain (CARDIA)    Difficulty of Paying Living Expenses: Not hard at all  Food Insecurity: No Food Insecurity (09/21/2023)   Hunger Vital Sign    Worried About Running Out of Food in the Last Year: Never true    Ran Out of Food  in the Last Year: Never true  Transportation Needs: No Transportation Needs (09/21/2023)   PRAPARE - Administrator, Civil Service (Medical): No    Lack of Transportation (Non-Medical): No  Physical Activity: Inactive (09/21/2023)   Exercise Vital Sign    Days of Exercise per Week: 0 days    Minutes of Exercise per Session: 0 min  Stress: No Stress Concern Present (09/21/2023)   [employer] of Occupational Health - Occupational Stress Questionnaire    Feeling of Stress : Not at all  Social Connections: Unknown (09/21/2023)   Social Connection and Isolation Panel [NHANES]    Frequency of Communication with Friends and Family: More than three times a week    Frequency of Social Gatherings with Friends and Family: More than three times a week    Attends Religious Services: Not on file    Active Member of Clubs or Organizations: Yes    Attends Banker Meetings: More than 4 times per year    Marital Status: Married  Catering manager Violence: Not At Risk (  09/21/2023)   Humiliation, Afraid, Rape, and Kick questionnaire    Fear of Current or Ex-Partner: No    Emotionally Abused: No    Physically Abused: No    Sexually Abused: No    Family History  Problem Relation Age of Onset   Dementia Mother        late 40s   Alzheimer's disease Mother    Heart disease Father 64       died with MI   Melanoma Sister    Breast cancer Maternal Grandmother    Stroke Maternal Grandfather    Colon cancer Neg Hx    Esophageal cancer Neg Hx    Stomach cancer Neg Hx    Rectal cancer Neg Hx    Colon polyps Neg Hx    Social history is notable in that he is married has 4 children. He never smoked cigarettes. He does try to exercise. Previously had played basketball. 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    ROS General: Negative; No fevers, chills, or night sweats;  HEENT: Negative; No changes in vision or hearing, sinus congestion, difficulty  swallowing Pulmonary: Negative; No cough, wheezing, shortness of breath, hemoptysis Cardiovascular: See history of present illness GI: Negative; No nausea, vomiting, diarrhea, or abdominal pain GU: Negative; No dysuria, hematuria, or difficulty voiding Musculoskeletal: Negative; no myalgias, joint pain, or weakness Hematologic/Oncology: Negative; no easy bruising, bleeding Endocrine: Negative; no heat/cold intolerance; no diabetes Neuro: Negative; no changes in balance, headaches Skin: Negative; No rashes or skin lesions Psychiatric: Negative; No behavioral problems, depression Sleep: h/o obstructive sleep apnea treated with CPAP.  Last sleep study in March 2018 showed increased syndrome without significant sleep apnea.  No snoring, daytime sleepiness, hypersomnolence, bruxism, restless legs, hypnogognic hallucinations, no cataplexy Other comprehensive 14 point system review is negative.   PE BP 112/78   Pulse (!) 58   Ht 6\' 5"  (1.956 m)   Wt 238 lb 12.8 oz (108.3 kg)   SpO2 94%   BMI 28.32 kg/m    Repeat blood pressure by me was 120/74  Wt Readings from Last 3 Encounters:  11/22/23 239 lb (108.4 kg)  11/21/23 238 lb 12.8 oz (108.3 kg)  09/21/23 218 lb (98.9 kg)   General: Alert, oriented, no distress.  Skin: normal turgor, no rashes, warm and dry HEENT: Normocephalic, atraumatic. Pupils equal round and reactive to light; sclera anicteric; extraocular muscles intact; Nose without nasal septal hypertrophy Mouth/Parynx benign; Mallinpatti scale 3 Neck: No JVD, no carotid bruits; normal carotid upstroke Lungs: clear to ausculatation and percussion; no wheezing or rales Chest wall: without tenderness to palpitation Heart: PMI not displaced, RRR, s1 s2 normal, 1/6 systolic murmur, no diastolic murmur, no rubs, gallops, thrills, or heaves Abdomen: soft, nontender; no hepatosplenomehaly, BS+; abdominal aorta nontender and not dilated by palpation. Back: no CVA tenderness Pulses  2+ Musculoskeletal: full range of motion, normal strength, no joint deformities Extremities: no clubbing cyanosis or edema, Homan's sign negative  Neurologic: grossly nonfocal; Cranial nerves grossly wnl Psychologic: Normal mood and affect  EKG Interpretation Date/Time:  Tuesday November 21 2023 08:50:29 EST Ventricular Rate:  58 PR Interval:  212 QRS Duration:  104 QT Interval:  430 QTC Calculation: 422 R Axis:   10  Text Interpretation: Sinus bradycardia with 1st degree A-V block with Premature atrial complexes Nonspecific ST abnormality When compared with ECG of 03-Apr-2013 15:03, Premature atrial complexes are now Present PR interval has increased T wave amplitude has increased in  Anterior leads Confirmed by Nicki Guadalajara ([id number]) on 11/23/2023 10:39:32 AM    December 12, 2022 ECG (independently read by me): NSR at 61, no ectopy  May 25, 2022 ECG (independently read by me): Sinus bradycardia at 55, no ectopy  June 01, 2021 ECG (independently read by me): Normal sinus rhythm at 69 bpm with an isolated PVC.  Nonspecific ST abnormality.  QTc interval 454 ms.  October 2021 ECG (independently read by me): NSR ay 60; NSST changes; QTc 430 msec  August 2020 ECG (independently read by me): Normal sinus rhythm at 62 bpm.  Nonspecific T changes.  QTc interval normal at 414 ms.  February 2019 ECG (independently read by me): Normal sinus rhythm at 70 bpm.  Isolated PVC.  Nonspecific ST changes.  QTc interval 425 ms.  January 2018 ECG (independently read by me): Normal sinus rhythm at 73 bpm.  PR interval 170 ms, QTc interval 423 ms.  Nonspecific ST-T changes.  December 2014 ECG: Normal sinus rhythm at 66 beats per minute. Nonspecific ST-T changes. QTc interval 429 ms.  LABS:     Latest Ref Rng & Units 11/22/2023   11:28 AM 11/21/2022   10:34 AM 05/23/2022    2:45 PM  BMP  Glucose 70 - 99 mg/dL 94  604  97   BUN 6 - 23 mg/dL 15  15  17    Creatinine 0.40 - 1.50 mg/dL 5.40  9.81  1.91    Sodium 135 - 145 mEq/L 142  142  140   Potassium 3.5 - 5.1 mEq/L 4.1  4.0  4.1   Chloride 96 - 112 mEq/L 108  107  107   CO2 19 - 32 mEq/L 29  27  30    Calcium 8.4 - 10.5 mg/dL 9.3  9.5  9.5       Latest Ref Rng & Units 11/22/2023   11:28 AM 11/21/2022   10:34 AM 05/23/2022    2:45 PM  Hepatic Function  Total Protein 6.0 - 8.3 g/dL 6.1  6.8  6.1   Albumin 3.5 - 5.2 g/dL 4.0  4.2  3.9   AST 0 - 37 U/L 24  27  26    ALT 0 - 53 U/L 25  31  31    Alk Phosphatase 39 - 117 U/L 62  64  70   Total Bilirubin 0.2 - 1.2 mg/dL 0.9  0.8  0.6   Bilirubin, Direct 0.0 - 0.3 mg/dL 0.3  0.2  0.2       Latest Ref Rng & Units 11/22/2023   11:28 AM 11/21/2022   10:34 AM 05/23/2022    2:45 PM  CBC  WBC 4.0 - 10.5 K/uL 3.8  5.4  3.8   Hemoglobin 13.0 - 17.0 g/dL 47.8  29.5  62.1   Hematocrit 39.0 - 52.0 % 42.0  45.6  42.2   Platelets 150.0 - 400.0 K/uL 203.0  213.0  196.0    Lab Results  Component Value Date   MCV 92.2 11/22/2023   MCV 91.2 11/21/2022   MCV 90.6 05/23/2022   Lab Results  Component Value Date   TSH 2.07 11/22/2023   Lipid Panel     Component Value Date/Time   CHOL 94 11/22/2023 1128   CHOL 178 09/18/2020 1038   TRIG 79.0 11/22/2023 1128   HDL 40.60 11/22/2023 1128   HDL 40 09/18/2020 1038   CHOLHDL 2 11/22/2023 1128   VLDL 15.8 11/22/2023 1128   LDLCALC 38 11/22/2023 1128   LDLCALC  122 (H) 09/18/2020 1038    CARDIOLOGY STUDIES:  Patch Wear Time:  13 days and 4 hours (2023-06-24T20:22:07-399 to 2023-07-08T00:42:09-399)   Patient had a min HR of 48 bpm, max HR of 164 bpm, and avg HR of 61 bpm. Predominant underlying rhythm was Sinus Rhythm. 1 run of Ventricular Tachycardia occurred lasting 4 beats with a max rate of 143 bpm (avg 135 bpm). 57 Supraventricular Tachycardia runs occurred, the run with the fastest interval lasting 4 beats with a max rate of 164 bpm, the longest lasting 14 beats with an avg rate of 143 bpm. Isolated SVEs were rare (<1.0%), SVE Couplets  were rare (<1.0%), and SVE Triplets were rare (<1.0%). Isolated VEs were occasional (2.6%, [id number]), VE Couplets were rare (<1.0%, 377), and VE Triplets were rare (<1.0%, 20). Ventricular Bigeminy and Trigeminy were present.   Predominant rhythm was sinus rhythm with an average rate 61 bpm.  There was 1 short episode of nonsustained VT lasting 4 beats with a heart rate range of 1 19-1 43 (average 135 bpm.  There were short bursts of SVT with the fastest interval at 164 bpm and lasting 4 beats.  The longest interval was 14 beats at an average rate at 143.  PVCs were present.  The patient had 1 episode of ventricular trigeminy lasting 29.5 seconds.  There were no significant sinus pauses.  There is no evidence of any atrial fibrillation.   Cardiac MRI and MRA of the chest done at [facility] were reviewed from March 31, 2023.  IMPRESSION:  1. Essential hypertension   2. PAF (paroxysmal atrial fibrillation) [facility]): s/p ablation at [facility] 2011, andDrJean Rosenthal at [facility] 2014   3. Mitral valve prolapse with mild MR   4. Aortic root dilatation (HCC)   5. Aneurysm of ascending aorta without rupture (HCC)   6. Hyperlipidemia with target low density lipoprotein (LDL) cholesterol less than 55 mg/dL   7. Alzheimer's dementia, unspecified dementia severity, unspecified timing of dementia onset, unspecified whether behavioral, psychotic, or mood disturbance or anxiety (HCC)     ASSESSMENT AND PLAN: Dustin Berry is a 77 year-old Caucasian male who has a history of paroxysmal atrial fibrillation dating back to 2000 when he was initiated on therapy at [facility].  He underwent successful radiofrequency AF ablation with pulmonary vein isolation in June 2014 by Dr. Sedalia Muta at [facility]. He has been maintaining sinus rhythm.  He had been on eliquis anticoagulation and this was discontinued by his [facility] physicians and now he is just on aspirin 81 mg.  An echo Doppler study in  2014 showed an ejection fraction of 45-50%.   The last echo of February 2018  showed an EF of 50-55%, grade 1 diastolic dysfunction, mitral valve prolapse involving both leaflets with mild MR, mild biatrial enlargement, and mild dilation of his ascending aorta at 4.5 cm.  A CT angio of his chest showed a 4.3 cm descending thoracic aortic aneurysm.  He continues to be asymptomatic with reference to chest pain.  When last seen by me in June 2022 his blood pressure was elevated and he had experienced some mild palpitations.  I subsequently further titrated his metoprolol succinate to 37.5 mg and then to 50 mg daily.  An echo Doppler study was recommended and this revealed EF at 46% of raise concern for dilated aortic root and thoracic aortic aneurysm.  He was subsequently evaluated at [facility] by Dr. Italy Hayes and had repeat echo Doppler assessment in April 2023.  When  evaluated by me in June 2023 he had experienced some palpitations and was concerned whether or not he had gone back in A-fib.  He was in sinus rhythm.  I reviewed his Zio patch monitor with him which showed predominant sinus rhythm with a rate at 81 bpm.  Slowest heart rate was 48 bpm with maximum 164 bpm.  He had 1 short nonsustained run of VT lasting 4 beats with an average rate of 135.  There were 57 short episodes of SVT with the fastest interval lasting 4 beats at a maximum rate at 164 and the longest interval lasting 14 beats at an average rate at 143.  He was also noted to have PACs, bigeminy and trigeminy.  At his January 2024 with me he was on metoprolol succinate 50 mg in the morning in addition to lisinopril 10 mg daily.  I recommended slight titration of his metoprolol succinate and he will take 50 mg in the morning and 12.5 mg at night.  If he experiences continued palpitations he will increase his evening dose to 25 mg and if blood pressure gets low lisinopril may need to be further decreased.  He continued to be on atorvastatin 80 mg daily for hyperlipidemia.  Lipid studies on November 21, 2022 showed excellent LDL cholesterol at 43 triglycerides 48 total cholesterol 102.  He will be seeing Dr. Toney Sang in follow-up of his ascending thoracic aortic aneurysm.  I reviewed his most recent cardiac MRI and MRA data from [facility] in April 2024 which has been followed closely by Dr. Kizzie Bane.  Presently, he has noticed some slight increased palpitations when he had forgotten to take metoprolol these have resolved with resumption of therapy as prescribed.  He is now undergoing therapy for Alzheimer's disease followed by Dr. Teresa Coombs of neurology.  He will be seeing his primary physician tomorrow and repeat laboratory will be obtained in the fasting state.  He will be seeing Dr. Kizzie Bane at [facility] in April.  I discussed with him my plans for retirement in need 2025.  In 8 months, I have recommended a follow-up Cardiologic evaluation and since I will be retiring we will transition him to the care of Dr. Royann Shivers.  Dustin Bihari, MD, [facility]  11/23/2023 11:11 AM

## 2023-11-21 NOTE — Patient Instructions (Signed)
Medication Instructions:  The current medical regimen is effective;  continue present plan and medications as directed. Please refer to the Current Medication list given to you today.   *If you need a refill on your cardiac medications before your next appointment, please call your pharmacy*   Lab Work: No labs were ordered during today's visit.  If you have labs (blood work) drawn today and your tests are completely normal, you will receive your results only by: MyChart Message (if you have MyChart) OR A paper copy in the mail If you have any lab test that is abnormal or we need to change your treatment, we will call you to review the results.   Testing/Procedures: No procedures ordered today.    Follow-Up: At [facility], you and your health needs are our priority.  As part of our continuing mission to provide you with exceptional heart care, we have created designated Provider Care Teams.  These Care Teams include your primary Cardiologist (physician) and Advanced Practice Providers (APPs -  Physician Assistants and Nurse Practitioners) who all work together to provide you with the care you need, when you need it.  We recommend signing up for the patient portal called "MyChart".  Sign up information is provided on this After Visit Summary.  MyChart is used to connect with patients for Virtual Visits (Telemedicine).  Patients are able to view lab/test results, encounter notes, upcoming appointments, etc.  Non-urgent messages can be sent to your provider as well.   To learn more about what you can do with MyChart, go to [email or web address].    Your next appointment:   8 month(s)  Provider:   Dr. Rachelle Hora Croitoru

## 2023-11-22 ENCOUNTER — Encounter: Payer: Self-pay | Admitting: Neurology

## 2023-11-22 ENCOUNTER — Encounter: Payer: Self-pay | Admitting: Internal Medicine

## 2023-11-22 ENCOUNTER — Ambulatory Visit: Payer: Medicare Other | Admitting: Internal Medicine

## 2023-11-22 VITALS — BP 120/72 | HR 60 | Temp 97.7°F | Ht 77.0 in | Wt 239.0 lb

## 2023-11-22 DIAGNOSIS — I10 Essential (primary) hypertension: Secondary | ICD-10-CM | POA: Diagnosis not present

## 2023-11-22 DIAGNOSIS — R739 Hyperglycemia, unspecified: Secondary | ICD-10-CM | POA: Diagnosis not present

## 2023-11-22 DIAGNOSIS — E538 Deficiency of other specified B group vitamins: Secondary | ICD-10-CM | POA: Diagnosis not present

## 2023-11-22 DIAGNOSIS — E7800 Pure hypercholesterolemia, unspecified: Secondary | ICD-10-CM | POA: Diagnosis not present

## 2023-11-22 DIAGNOSIS — N400 Enlarged prostate without lower urinary tract symptoms: Secondary | ICD-10-CM | POA: Diagnosis not present

## 2023-11-22 DIAGNOSIS — Z125 Encounter for screening for malignant neoplasm of prostate: Secondary | ICD-10-CM | POA: Diagnosis not present

## 2023-11-22 DIAGNOSIS — N320 Bladder-neck obstruction: Secondary | ICD-10-CM

## 2023-11-22 DIAGNOSIS — E559 Vitamin D deficiency, unspecified: Secondary | ICD-10-CM | POA: Diagnosis not present

## 2023-11-22 LAB — CBC WITH DIFFERENTIAL/PLATELET
Basophils Absolute: 0 10*3/uL (ref 0.0–0.1)
Basophils Relative: 0.8 % (ref 0.0–3.0)
Eosinophils Absolute: 0.3 10*3/uL (ref 0.0–0.7)
Eosinophils Relative: 6.9 % — ABNORMAL HIGH (ref 0.0–5.0)
HCT: 42 % (ref 39.0–52.0)
Hemoglobin: 14 g/dL (ref 13.0–17.0)
Lymphocytes Relative: 29.8 % (ref 12.0–46.0)
Lymphs Abs: 1.1 10*3/uL (ref 0.7–4.0)
MCHC: 33.4 g/dL (ref 30.0–36.0)
MCV: 92.2 fL (ref 78.0–100.0)
Monocytes Absolute: 0.5 10*3/uL (ref 0.1–1.0)
Monocytes Relative: 13 % — ABNORMAL HIGH (ref 3.0–12.0)
Neutro Abs: 1.9 10*3/uL (ref 1.4–7.7)
Neutrophils Relative %: 49.5 % (ref 43.0–77.0)
Platelets: 203 10*3/uL (ref 150.0–400.0)
RBC: 4.56 Mil/uL (ref 4.22–5.81)
RDW: 14 % (ref 11.5–15.5)
WBC: 3.8 10*3/uL — ABNORMAL LOW (ref 4.0–10.5)

## 2023-11-22 LAB — URINALYSIS, ROUTINE W REFLEX MICROSCOPIC
Bilirubin Urine: NEGATIVE
Hgb urine dipstick: NEGATIVE
Ketones, ur: NEGATIVE
Leukocytes,Ua: NEGATIVE
Nitrite: NEGATIVE
RBC / HPF: NONE SEEN (ref 0–?)
Specific Gravity, Urine: 1.01 (ref 1.000–1.030)
Total Protein, Urine: NEGATIVE
Urine Glucose: NEGATIVE
Urobilinogen, UA: 0.2 (ref 0.0–1.0)
pH: 6 (ref 5.0–8.0)

## 2023-11-22 LAB — LIPID PANEL
Cholesterol: 94 mg/dL (ref 0–200)
HDL: 40.6 mg/dL (ref >39.00)
LDL Cholesterol: 38 mg/dL (ref 0–99)
NonHDL: 53.67
Total CHOL/HDL Ratio: 2
Triglycerides: 79 mg/dL (ref 0.0–149.0)
VLDL: 15.8 mg/dL (ref 0.0–40.0)

## 2023-11-22 LAB — HEPATIC FUNCTION PANEL
ALT: 25 U/L (ref 0–53)
AST: 24 U/L (ref 0–37)
Albumin: 4 g/dL (ref 3.5–5.2)
Alkaline Phosphatase: 62 U/L (ref 39–117)
Bilirubin, Direct: 0.3 mg/dL (ref 0.0–0.3)
Total Bilirubin: 0.9 mg/dL (ref 0.2–1.2)
Total Protein: 6.1 g/dL (ref 6.0–8.3)

## 2023-11-22 LAB — BASIC METABOLIC PANEL
BUN: 15 mg/dL (ref 6–23)
CO2: 29 meq/L (ref 19–32)
Calcium: 9.3 mg/dL (ref 8.4–10.5)
Chloride: 108 meq/L (ref 96–112)
Creatinine, Ser: 1.09 mg/dL (ref 0.40–1.50)
GFR: 65.91 mL/min (ref >60.00)
Glucose, Bld: 94 mg/dL (ref 70–99)
Potassium: 4.1 meq/L (ref 3.5–5.1)
Sodium: 142 meq/L (ref 135–145)

## 2023-11-22 LAB — PSA: PSA: 1.12 ng/mL (ref 0.10–4.00)

## 2023-11-22 LAB — VITAMIN B12: Vitamin B-12: 1179 pg/mL — ABNORMAL HIGH (ref 211–911)

## 2023-11-22 LAB — HEMOGLOBIN A1C: Hgb A1c MFr Bld: 5.7 % (ref 4.6–6.5)

## 2023-11-22 LAB — TSH: TSH: 2.07 u[IU]/mL (ref 0.35–5.50)

## 2023-11-22 LAB — VITAMIN D 25 HYDROXY (VIT D DEFICIENCY, FRACTURES): VITD: 37.25 ng/mL (ref 30.00–100.00)

## 2023-11-22 MED ORDER — LISINOPRIL 5 MG PO TABS: 5.0000 mg | ORAL_TABLET | Freq: Every day | ORAL | 3 refills | Status: DC

## 2023-11-22 MED ORDER — SILODOSIN 8 MG PO CAPS: 8.0000 mg | ORAL_CAPSULE | Freq: Every day | ORAL | 3 refills | Status: DC

## 2023-11-22 MED ORDER — ATORVASTATIN CALCIUM 80 MG PO TABS: 80.0000 mg | ORAL_TABLET | Freq: Every day | ORAL | 3 refills | Status: DC

## 2023-11-22 NOTE — Progress Notes (Signed)
Patient ID: Dustin Sniff., male   DOB: 30-Oct-1946, 77 y.o.   MRN: [id number]         Chief Complaint:: yearly exam and dementia, bph. Hld, low b12 and vit d       HPI:  Dustin Mceuen. is a 77 y.o. male here overall doing ok,  Pt denies chest pain, increased sob or doe, wheezing, orthopnea, PND, increased LE swelling, palpitations, dizziness or syncope.   Pt denies polydipsia, polyuria, or new focal neuro s/s.    Pt denies fever, wt loss, night sweats, loss of appetite, or other constitutional symptoms  Now taking Leqembi with early dementia.  Also flomax no longer working as well with slower flow than before, asks for change in tx.           Wt Readings from Last 3 Encounters:  11/22/23 239 lb (108.4 kg)  11/21/23 238 lb 12.8 oz (108.3 kg)  09/21/23 218 lb (98.9 kg)   BP Readings from Last 3 Encounters:  11/22/23 120/72  11/21/23 112/78  09/21/23 118/80   Immunization History  Administered Date(s) Administered   Influenza Split 09/05/2012   Influenza, High Dose Seasonal PF 09/05/2018, 09/18/2021   Influenza, Quadrivalent, Recombinant, Inj, Pf 09/02/2019   Influenza-Unspecified 11/22/2017, 10/07/2021, 10/14/2021, 10/08/2022, 11/11/2023   Moderna Covid-19 Fall Seasonal Vaccine 69yrs & older 06/30/2023   Moderna Sars-Covid-2 Vaccination 01/27/2020, 02/24/2020, 10/15/2020, 09/13/2021   PNEUMOCOCCAL CONJUGATE-20 11/13/2023   Pneumococcal Conjugate-13 04/29/2014   Pneumococcal Polysaccharide-23 09/05/2012   Td 05/14/2009   Tdap 06/12/2019   Zoster Recombinant(Shingrix) 05/05/2018, 07/05/2018   Zoster, Live 07/10/2009  There are no preventive care reminders to display for this patient.    Past Medical History:  Diagnosis Date   Abdominal pain 04/01/2013   Acute bronchitis 03/07/2018   Allergic rhinitis 05/12/2008   Aortic atherosclerosis 08/23/2020   Arthritis    Ascending aortic aneurysm 12/13/2018   Atrial fibrillation with RVR 10/21/2012   Echo- EF 50-55%; mild  concentric LVH; flow pattern suggestive of impaired LV relaxation; mild mitral valve prolapse, trace mitral regurgitation   Back pain    receiving PT   Benign prostatic hyperplasia 09/05/2012   Boil of buttock 06/24/2011   Bradycardia 04/17/2013   Cataract    Cellulitis of knee, left 04/12/2016   Chest pain with exertion presumed to be tachycardia related along with SOB 04/01/2013   Degeneration of lumbar intervertebral disc 12/10/2020   Eustachian tube dysfunction, bilateral 03/07/2018   External otitis of left ear 06/11/2020   Fatigue 07/04/2011   GERD (gastroesophageal reflux disease) 03/24/2009   Heart murmur    Hematuria, possible 04/01/2013   Hereditary and idiopathic peripheral neuropathy 05/12/2008   HLD (hyperlipidemia) 08/18/2007   HTN (hypertension) 12/04/2013   Insomnia 06/08/2016   Left knee pain 12/13/2018   Low back pain 12/01/2020   Lumbar spondylosis 05/05/2021   Mild cognitive impairment of uncertain or unknown etiology 12/09/2022   Mitral valve prolapse 07/04/2011   MRSA (methicillin resistant staph aureus) culture positive 5+ years ago   Neck pain 05/05/2021   OSA (obstructive sleep apnea) 07/04/2011   Using CPAP nightly   Pain in right hand 02/04/2020   Palpitations 03/06/2007   R/P MV - mild perfusion defect in basal inferoseptal, basal inferior, mid inferoseptal, and mid inferior regions, consistent w/ infarct/scar; no scintigraphic evidence of inducible myocardial ischemia; prior non transmural infarct cannot be completely excluded; EF 48%; no significant change from previous study   Personal history of colonic polyps  05/12/2008   Radicular pain in left arm 05/05/2021   Sacroiliitis 05/05/2021   Stricture and stenosis of esophagus 03/24/2009   TIA (transient ischemic attack) 11/22/2021   Tinnitus 09/05/2012   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   Vitamin B12 deficiency 06/15/2019   Vitamin D deficiency 05/23/2022   Past Surgical History:  Procedure  Laterality Date   ATRIAL FIBRILLATION ABLATION     CARDIOVERSION N/A 04/03/2013   Procedure: CARDIOVERSION;  Surgeon: Chrystie Nose, MD;  Location[facility] ENDOSCOPY;  Service: Cardiovascular;  Laterality: N/A;   COLONOSCOPY  2018   HAND SURGERY     Thumb joint repair   POLYPECTOMY     TEE WITHOUT CARDIOVERSION N/A 04/03/2013   Procedure: TRANSESOPHAGEAL ECHOCARDIOGRAM (TEE);  Surgeon: Chrystie Nose, MD;  Location: [facility] ENDOSCOPY;  Service: Cardiovascular;  Laterality: N/A;   TONSILLECTOMY AND ADENOIDECTOMY     UPPER GASTROINTESTINAL ENDOSCOPY     dilation    reports that he has never smoked. He has never used smokeless tobacco. He reports that he does not currently use alcohol. He reports that he does not use drugs. family history includes Alzheimer's disease in his mother; Breast cancer in his maternal grandmother; Dementia in his mother; Heart disease (age of onset: 66) in his father; Melanoma in his sister; Stroke in his maternal grandfather. Allergies  Allergen Reactions   Penicillin G Rash   Quinolones Other (See Comments)    Other reaction(s): Other (See Comments) Fluroquinolone antibiotics should be avoided in patients with history of aortic aneurysm/dissection   Amoxil [Amoxicillin] Rash   Current Outpatient Medications on File Prior to Visit  Medication Sig Dispense Refill   aspirin 81 MG EC tablet Take 1 tablet (81 mg total) by mouth daily. Swallow whole. 30 tablet 12   cyanocobalamin (VITAMIN B12) 100 MCG tablet Take 100 mcg by mouth daily.     meloxicam (MOBIC) 15 MG tablet Take 15 mg by mouth as needed for pain. TAKE 0.5-1 tablet  AS NEEDED     metoprolol succinate (TOPROL-XL) 25 MG 24 hr tablet Take 2 tablets (50 mg total) by mouth every morning AND 0.5 tablets (12.5 mg total) every evening. 225 tablet 3   rivastigmine (EXELON) 3 MG capsule Take 1 capsule (3 mg total) by mouth 2 (two) times daily. 180 capsule 3   VITAMIN D PO Take 1 tablet by mouth daily in the afternoon.      zolpidem (AMBIEN) 5 MG tablet TAKE 1 TABLET BY MOUTH AT BEDTIME AS NEEDED FOR SLEEP. 30 tablet 0   No current facility-administered medications on file prior to visit.        ROS:  All others reviewed and negative.  Objective        PE:  BP 120/72 (BP Location: Right Arm, Patient Position: Sitting, Cuff Size: Normal)   Pulse 60   Temp 97.7 F (36.5 C) (Oral)   Ht 6\' 5"  (1.956 m)   Wt 239 lb (108.4 kg)   SpO2 96%   BMI 28.34 kg/m                 Constitutional: Pt appears in NAD               HENT: Head: NCAT.                Right Ear: External ear normal.                 Left Ear: External ear normal.  Eyes: . Pupils are equal, round, and reactive to light. Conjunctivae and EOM are normal               Nose: without d/c or deformity               Neck: Neck supple. Gross normal ROM               Cardiovascular: Normal rate and regular rhythm.                 Pulmonary/Chest: Effort normal and breath sounds without rales or wheezing.                Abd:  Soft, NT, ND, + BS, no organomegaly               Neurological: Pt is alert. At baseline orientation, motor grossly intact               Skin: Skin is warm. No rashes, no other new lesions, LE edema - none               Psychiatric: Pt behavior is normal without agitation   Micro: none  Cardiac tracings I have personally interpreted today:  none  Pertinent Radiological findings (summarize): none   Lab Results  Component Value Date   WBC 3.8 (L) 11/22/2023   HGB 14.0 11/22/2023   HCT 42.0 11/22/2023   PLT 203.0 11/22/2023   GLUCOSE 94 11/22/2023   CHOL 94 11/22/2023   TRIG 79.0 11/22/2023   HDL 40.60 11/22/2023   LDLCALC 38 11/22/2023   ALT 25 11/22/2023   AST 24 11/22/2023   NA 142 11/22/2023   K 4.1 11/22/2023   CL 108 11/22/2023   CREATININE 1.09 11/22/2023   BUN 15 11/22/2023   CO2 29 11/22/2023   TSH 2.07 11/22/2023   PSA 1.12 11/22/2023   INR 1.19 04/01/2013   HGBA1C 5.7 11/22/2023    Assessment/Plan:  Dustin Canedy. is a 77 y.o. White or Caucasian [1] male with  has a past medical history of Abdominal pain (04/01/2013), Acute bronchitis (03/07/2018), Allergic rhinitis (05/12/2008), Aortic atherosclerosis (08/23/2020), Arthritis, Ascending aortic aneurysm (12/13/2018), Atrial fibrillation with RVR (10/21/2012), Back pain, Benign prostatic hyperplasia (09/05/2012), Boil of buttock (06/24/2011), Bradycardia (04/17/2013), Cataract, Cellulitis of knee, left (04/12/2016), Chest pain with exertion presumed to be tachycardia related along with SOB (04/01/2013), Degeneration of lumbar intervertebral disc (12/10/2020), Eustachian tube dysfunction, bilateral (03/07/2018), External otitis of left ear (06/11/2020), Fatigue (07/04/2011), GERD (gastroesophageal reflux disease) (03/24/2009), Heart murmur, Hematuria, possible (04/01/2013), Hereditary and idiopathic peripheral neuropathy (05/12/2008), HLD (hyperlipidemia) (08/18/2007), HTN (hypertension) (12/04/2013), Insomnia (06/08/2016), Left knee pain (12/13/2018), Low back pain (12/01/2020), Lumbar spondylosis (05/05/2021), Mild cognitive impairment of uncertain or unknown etiology (12/09/2022), Mitral valve prolapse (07/04/2011), MRSA (methicillin resistant staph aureus) culture positive (5+ years ago), Neck pain (05/05/2021), OSA (obstructive sleep apnea) (07/04/2011), Pain in right hand (02/04/2020), Palpitations (03/06/2007), Personal history of colonic polyps (05/12/2008), Radicular pain in left arm (05/05/2021), Sacroiliitis (05/05/2021), Stricture and stenosis of esophagus (03/24/2009), TIA (transient ischemic attack) (11/22/2021), Tinnitus (09/05/2012), Tuberous sclerosis (06/08/2016), Vitamin B12 deficiency (06/15/2019), and Vitamin D deficiency (05/23/2022).  HLD (hyperlipidemia) Lab Results  Component Value Date   LDLCALC 38 11/22/2023   Stable, pt to continue current statin lipitor 80 mg qd   Benign prostatic  hyperplasia With mild worsening symptoms, ok to try change flomax to rapaflo, consider uroogy if not improved  HTN (hypertension) BP Readings from Last 3 Encounters:  11/22/23  120/72  11/21/23 112/78  09/21/23 118/80   Stable, pt to continue medical treatment toprol xl 25 mg every day, lisinopril 5 qd   Vitamin B12 deficiency Last vitamin D Lab Results  Component Value Date   VD25OH 37.25 11/22/2023   Low, to start oral replacement   Vitamin D deficiency Last vitamin D Lab Results  Component Value Date   VD25OH 37.25 11/22/2023   Low, to start oral replacement  Followup: Return in about 6 months (around 05/22/2024).  Oliver Barre, MD 11/25/2023 11:58 AM [facility]

## 2023-11-22 NOTE — Progress Notes (Signed)
Medication Error: Patient received 1500 mg of Leqembi instead of 1039 mg (max dose). Contacted Leqembi team to make them aware. Will contact patient once we received additional guidance.  Spoke with patient, informed him of the error and that I will update if I have more information. Right now, he is fine, has no symptoms and no complaints, but advised him to go to the hospital if he experiences any new focal neurological deficits.    Dr. Teresa Coombs

## 2023-11-22 NOTE — Progress Notes (Signed)
The test results show that your current treatment is OK, as the tests are stable.  Please continue the same plan.  There is no other need for change of treatment or further evaluation based on these results, at this time.  thanks 

## 2023-11-22 NOTE — Patient Instructions (Signed)
Ok to change the generic Flomax for prostate, to the Rapaflo 8 mg per day  Please continue all other medications as before, and refills have been done if requested.  Please have the pharmacy call with any other refills you may need.  Please continue your efforts at being more active, low cholesterol diet, and weight control.  You are otherwise up to date with prevention measures today.  Please keep your appointments with your specialists as you may have planned  Please go to the LAB at the blood drawing area for the tests to be done  You will be contacted by phone if any changes need to be made immediately.  Otherwise, you will receive a letter about your results with an explanation, but please check with MyChart first.  Please make an Appointment to return in 6 months, or sooner if needed

## 2023-11-23 ENCOUNTER — Encounter: Payer: Self-pay | Admitting: Cardiovascular Disease

## 2023-11-24 ENCOUNTER — Other Ambulatory Visit: Payer: Self-pay

## 2023-11-24 MED ORDER — SILODOSIN 8 MG PO CAPS: 8.0000 mg | ORAL_CAPSULE | Freq: Every day | ORAL | 3 refills | Status: DC

## 2023-11-25 ENCOUNTER — Encounter: Payer: Self-pay | Admitting: Internal Medicine

## 2023-11-25 ENCOUNTER — Ambulatory Visit
Admission: RE | Admit: 2023-11-25 | Discharge: 2023-11-25 | Disposition: A | Discharge status: Home / Self Care | Payer: Medicare Other | Source: Ambulatory Visit | Attending: Neurology

## 2023-11-25 DIAGNOSIS — G309 Alzheimer's disease, unspecified: Secondary | ICD-10-CM

## 2023-11-25 NOTE — Assessment & Plan Note (Signed)
BP Readings from Last 3 Encounters:  11/22/23 120/72  11/21/23 112/78  09/21/23 118/80   Stable, pt to continue medical treatment toprol xl 25 mg every day, lisinopril 5 qd

## 2023-11-25 NOTE — Assessment & Plan Note (Signed)
Lab Results  Component Value Date   LDLCALC 38 11/22/2023   Stable, pt to continue current statin lipitor 80 mg qd

## 2023-11-25 NOTE — Assessment & Plan Note (Signed)
With mild worsening symptoms, ok to try change flomax to rapaflo, consider uroogy if not improved

## 2023-11-25 NOTE — Assessment & Plan Note (Signed)
Last vitamin D Lab Results  Component Value Date   VD25OH 37.25 11/22/2023   Low, to start oral replacement

## 2023-11-27 ENCOUNTER — Encounter: Payer: Self-pay | Admitting: Neurology

## 2023-12-04 ENCOUNTER — Encounter: Payer: Self-pay | Admitting: Internal Medicine

## 2023-12-14 ENCOUNTER — Encounter: Payer: Self-pay | Admitting: Family Medicine

## 2023-12-14 ENCOUNTER — Ambulatory Visit (INDEPENDENT_AMBULATORY_CARE_PROVIDER_SITE_OTHER): Payer: Medicare Other | Admitting: Family Medicine

## 2023-12-14 VITALS — BP 106/62 | HR 64 | Temp 97.6°F | Ht 77.0 in | Wt 237.0 lb

## 2023-12-14 DIAGNOSIS — R109 Unspecified abdominal pain: Secondary | ICD-10-CM | POA: Diagnosis not present

## 2023-12-14 LAB — POC URINALSYSI DIPSTICK (AUTOMATED)
Bilirubin, UA: NEGATIVE
Blood, UA: NEGATIVE
Glucose, UA: NEGATIVE
Ketones, UA: NEGATIVE
Leukocytes, UA: NEGATIVE
Nitrite, UA: NEGATIVE
Protein, UA: NEGATIVE
Spec Grav, UA: >=1.03 — AB (ref 1.010–1.025)
Urobilinogen, UA: 0.2 U/dL
pH, UA: 6 (ref 5.0–8.0)

## 2023-12-14 NOTE — Patient Instructions (Signed)
 Your symptoms appear to be related to a musculoskeletal condition but if you have any new or worsening symptoms, please let us  know.   Use a heating pad on the area 2-3 times per day. You can try a topical medication such as Salon Pas with lidocaine  or lidocaine  patches over the counter.

## 2023-12-14 NOTE — Progress Notes (Signed)
 Subjective:     Patient ID: Dustin [name] Jewel Berry., male    DOB: 31-Jan-1946, 78 y.o.   MRN: [id number]  Chief Complaint  Patient presents with   Chest Pain    Rib pain on right side off and on for a month. Denies any injury     Chest Pain  Pertinent negatives include no abdominal pain, back pain, dizziness, fever, headaches, malaise/fatigue, nausea, palpitations, shortness of breath or vomiting.     History of Present Illness          C/o a dull intermittent right side pain x 1 month. Denies injury. He notices pain with certain movements. Pain at its worse is 4-5/10.   Hx of kidney disease. No new urinary symptoms.     There are no preventive care reminders to display for this patient.  Past Medical History:  Diagnosis Date   Abdominal pain 04/01/2013   Acute bronchitis 03/07/2018   Allergic rhinitis 05/12/2008   Aortic atherosclerosis 08/23/2020   Arthritis    Ascending aortic aneurysm 12/13/2018   Atrial fibrillation with RVR 10/21/2012   Echo- EF 50-55%; mild concentric LVH; flow pattern suggestive of impaired LV relaxation; mild mitral valve prolapse, trace mitral regurgitation   Back pain    receiving PT   Benign prostatic hyperplasia 09/05/2012   Boil of buttock 06/24/2011   Bradycardia 04/17/2013   Cataract    Cellulitis of knee, left 04/12/2016   Chest pain with exertion presumed to be tachycardia related along with SOB 04/01/2013   Degeneration of lumbar intervertebral disc 12/10/2020   Eustachian tube dysfunction, bilateral 03/07/2018   External otitis of left ear 06/11/2020   Fatigue 07/04/2011   GERD (gastroesophageal reflux disease) 03/24/2009   Heart murmur    Hematuria, possible 04/01/2013   Hereditary and idiopathic peripheral neuropathy 05/12/2008   HLD (hyperlipidemia) 08/18/2007   HTN (hypertension) 12/04/2013   Insomnia 06/08/2016   Left knee pain 12/13/2018   Low back pain 12/01/2020   Lumbar spondylosis 05/05/2021   Mild cognitive  impairment of uncertain or unknown etiology 12/09/2022   Mitral valve prolapse 07/04/2011   MRSA (methicillin resistant staph aureus) culture positive 5+ years ago   Neck pain 05/05/2021   OSA (obstructive sleep apnea) 07/04/2011   Using CPAP nightly   Pain in right hand 02/04/2020   Palpitations 03/06/2007   R/P MV - mild perfusion defect in basal inferoseptal, basal inferior, mid inferoseptal, and mid inferior regions, consistent w/ infarct/scar; no scintigraphic evidence of inducible myocardial ischemia; prior non transmural infarct cannot be completely excluded; EF 48%; no significant change from previous study   Personal history of colonic polyps 05/12/2008   Radicular pain in left arm 05/05/2021   Sacroiliitis 05/05/2021   Stricture and stenosis of esophagus 03/24/2009   TIA (transient ischemic attack) 11/22/2021   Tinnitus 09/05/2012   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   Vitamin B12 deficiency 06/15/2019   Vitamin D  deficiency 05/23/2022    Past Surgical History:  Procedure Laterality Date   ATRIAL FIBRILLATION ABLATION     CARDIOVERSION N/A 04/03/2013   Procedure: CARDIOVERSION;  Surgeon: Vinie KYM Maxcy, MD;  Location: [facility] ENDOSCOPY;  Service: Cardiovascular;  Laterality: N/A;   COLONOSCOPY  2018   HAND SURGERY     Thumb joint repair   POLYPECTOMY     TEE WITHOUT CARDIOVERSION N/A 04/03/2013   Procedure: TRANSESOPHAGEAL ECHOCARDIOGRAM (TEE);  Surgeon: Vinie KYM Maxcy, MD;  Location: [facility] ENDOSCOPY;  Service: Cardiovascular;  Laterality: N/A;   TONSILLECTOMY AND ADENOIDECTOMY  UPPER GASTROINTESTINAL ENDOSCOPY     dilation    Family History  Problem Relation Age of Onset   Dementia Mother        late 50s   Alzheimer's disease Mother    Heart disease Father 74       died with MI   Melanoma Sister    Breast cancer Maternal Grandmother    Stroke Maternal Grandfather    Colon cancer Neg Hx    Esophageal cancer Neg Hx    Stomach cancer Neg Hx    Rectal cancer Neg Hx     Colon polyps Neg Hx     Social History   Socioeconomic History   Marital status: Married    Spouse name: Not on file   Number of children: Not on file   Years of education: 16   Highest education level: Bachelor's degree (e.g., BA, AB, BS)  Occupational History   Occupation: Retail    Comment: Retail; semi-retired firefighter, 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  Tobacco Use   Smoking status: Never   Smokeless tobacco: Never  Vaping Use   Vaping status: Never Used  Substance and Sexual Activity   Alcohol use: Not Currently    Alcohol/week: 0.0 - 1.0 standard drinks of alcohol    Comment: infrequent glass of wine   Drug use: No   Sexual activity: Not on file  Other Topics Concern   Not on file  Social History Narrative   Not on file   Social Drivers of Health   Financial Resource Strain: Low Risk  (09/21/2023)   Overall Financial Resource Strain (CARDIA)    Difficulty of Paying Living Expenses: Not hard at all  Food Insecurity: No Food Insecurity (09/21/2023)   Hunger Vital Sign    Worried About Running Out of Food in the Last Year: Never true    Ran Out of Food in the Last Year: Never true  Transportation Needs: No Transportation Needs (09/21/2023)   PRAPARE - Administrator, Civil Service (Medical): No    Lack of Transportation (Non-Medical): No  Physical Activity: Inactive (09/21/2023)   Exercise Vital Sign    Days of Exercise per Week: 0 days    Minutes of Exercise per Session: 0 min  Stress: No Stress Concern Present (09/21/2023)   [employer] of Occupational Health - Occupational Stress Questionnaire    Feeling of Stress : Not at all  Social Connections: Unknown (09/21/2023)   Social Connection and Isolation Panel [NHANES]    Frequency of Communication with Friends and Family: More than three times a week    Frequency of Social Gatherings with Friends and Family: More than three times a week    Attends Religious Services: Not on file     Active Member of Clubs or Organizations: Yes    Attends Banker Meetings: More than 4 times per year    Marital Status: Married  Catering Manager Violence: Not At Risk (09/21/2023)   Humiliation, Afraid, Rape, and Kick questionnaire    Fear of Current or Ex-Partner: No    Emotionally Abused: No    Physically Abused: No    Sexually Abused: No    Outpatient Medications Prior to Visit  Medication Sig Dispense Refill   aspirin  81 MG EC tablet Take 1 tablet (81 mg total) by mouth daily. Swallow whole. 30 tablet 12   atorvastatin  (LIPITOR) 80 MG tablet Take 1 tablet (80 mg total) by mouth daily. 90 tablet 3  cyanocobalamin  (VITAMIN B12) 100 MCG tablet Take 100 mcg by mouth daily.     lisinopril  (ZESTRIL ) 5 MG tablet Take 1 tablet (5 mg total) by mouth daily. 90 tablet 3   meloxicam (MOBIC) 15 MG tablet Take 15 mg by mouth as needed for pain. TAKE 0.5-1 tablet  AS NEEDED     metoprolol  succinate (TOPROL -XL) 25 MG 24 hr tablet Take 2 tablets (50 mg total) by mouth every morning AND 0.5 tablets (12.5 mg total) every evening. 225 tablet 3   rivastigmine  (EXELON ) 3 MG capsule Take 1 capsule (3 mg total) by mouth 2 (two) times daily. 180 capsule 3   silodosin  (RAPAFLO ) 8 MG CAPS capsule Take 1 capsule (8 mg total) by mouth daily with breakfast. 90 capsule 3   VITAMIN D  PO Take 1 tablet by mouth daily in the afternoon.     zolpidem  (AMBIEN ) 5 MG tablet TAKE 1 TABLET BY MOUTH AT BEDTIME AS NEEDED FOR SLEEP. 30 tablet 0   No facility-administered medications prior to visit.    Allergies  Allergen Reactions   Penicillin G Rash   Quinolones Other (See Comments)    Other reaction(s): Other (See Comments) Fluroquinolone antibiotics should be avoided in patients with history of aortic aneurysm/dissection   Amoxil [Amoxicillin] Rash    Review of Systems  Constitutional:  Negative for chills, fever, malaise/fatigue and weight loss.  Respiratory:  Negative for shortness of breath.    Cardiovascular:  Negative for chest pain, palpitations and leg swelling.  Gastrointestinal:  Negative for abdominal pain, blood in stool, constipation, diarrhea, nausea and vomiting.  Genitourinary:  Negative for dysuria, flank pain, frequency, hematuria and urgency.  Musculoskeletal:  Positive for myalgias and neck pain. Negative for back pain, falls and joint pain.       Intermittent neck pain, ongoing, sees Chiropractor   Skin:  Negative for itching and rash.  Neurological:  Negative for dizziness, sensory change, focal weakness and headaches.       Objective:    Physical Exam Constitutional:      General: He is not in acute distress.    Appearance: He is not ill-appearing.  Eyes:     Extraocular Movements: Extraocular movements intact.     Conjunctiva/sclera: Conjunctivae normal.  Neck:     Thyroid : No thyromegaly.  Cardiovascular:     Rate and Rhythm: Normal rate and regular rhythm.  Pulmonary:     Effort: Pulmonary effort is normal.     Breath sounds: Normal breath sounds.  Chest:     Chest wall: No tenderness or edema.  Abdominal:     General: Bowel sounds are normal. There is no abdominal bruit.     Palpations: Abdomen is soft. There is no hepatomegaly, splenomegaly or mass.     Tenderness: There is no abdominal tenderness. There is no guarding or rebound.  Musculoskeletal:     Cervical back: Normal range of motion and neck supple.     Thoracic back: Normal.     Lumbar back: Normal.     Comments: Good sensation and ROM of back. No rash or bruising. Pain reproduced with stretching with arms out over head and rotation of torso.   Lymphadenopathy:     Cervical: No cervical adenopathy.  Skin:    General: Skin is warm and dry.     Findings: No ecchymosis, erythema or rash.  Neurological:     General: No focal deficit present.     Mental Status: He is alert and oriented to  person, place, and time.     Cranial Nerves: No cranial nerve deficit.     Motor: No weakness.   Psychiatric:        Mood and Affect: Mood normal.        Behavior: Behavior normal.        Thought Content: Thought content normal.      BP 106/62 (BP Location: Left Arm, Patient Position: Sitting, Cuff Size: Large)   Pulse 64   Temp 97.6 F (36.4 C) (Temporal)   Ht 6' 5 (1.956 m)   Wt 237 lb (107.5 kg)   SpO2 97%   BMI 28.10 kg/m  Wt Readings from Last 3 Encounters:  12/14/23 237 lb (107.5 kg)  11/22/23 239 lb (108.4 kg)  11/21/23 238 lb 12.8 oz (108.3 kg)       Assessment & Plan:   Problem List Items Addressed This Visit   None Visit Diagnoses       Acute flank pain    -  Primary   Relevant Orders   POCT Urinalysis Dipstick (Automated) (Completed)      UA negative.  No red flag symptoms.  Normal exam.  Discussed possible etiologies with MSK being the most likely etiology.  Discussed symptomatic treatment. Follow up if any new or worsening symptoms.   I am having Dustin Berry. maintain his meloxicam, VITAMIN D  PO, zolpidem , aspirin  EC, metoprolol  succinate, cyanocobalamin , rivastigmine , atorvastatin , lisinopril , and silodosin .  No orders of the defined types were placed in this encounter.

## 2023-12-20 NOTE — Progress Notes (Deleted)
 [facility]  PATIENT: Dustin Berry. DOB: [birth year]  REQUESTING CLINICIAN: Corwin Levins, MD HISTORY FROM: Patient and Spouse  REASON FOR VISIT: Memory problem   HISTORICAL  CHIEF COMPLAINT:  No chief complaint on file.  Today December 21, 2023 SS: Has been on Leqembi since 06/01/23 with infusions every 2 weeks, most recent was yesterday 12/20/23. Will need new authorization before any more infusions.   -MRI of the brain 07/20/2023 stable minimal chronic microvascular ischemic change, mild generalized cortical atrophy.  No ARIA. -MRI of the brain 08/22/2023 was stable, no ARIA. -MRI of the brain 11/25/2023 stable, no ARIA  INTERVAL HISTORY 05/03/2023:  Patient presents today for follow-up, last visit was in February.  Since then he reports that he has been stable.  He did follow-up with the [place] [facility] [place] but was not included in the clinical trial.  He is still interested in starting [place].  No other complaints or concerns.   INTERVAL HISTORY 01/31/2023:  Patient presents today for follow-up, he is accompanied by wife.  He did have some questions regarding a clinical trial in [place] Clinical biochemist and Feasibility of [employer] Disruption for Mild Cognitive Impairment or Mild Alzheimer's Disease Undergoing Standard of Care Monoclonal Antibody (mAb) Therapy).   They are requesting him to have a amyloid PET scan or a CSF amyloid test.  He is doing well with the Exelon so far.  No new symptoms.   INTERVAL HISTORY 01/16/2023:  Patient presents today for follow-up, last visit was in July.  At that time, he followed up with Dr. Milbert Coulter neuropsych, had a full test completed and he was diagnosed with mild cognitive impairment.  He reports that his symptoms are still the same.  They are not getting worse.  Currently he is on Exelon 3 mg twice daily.  For his neuropathic pain, he did try pregabalin but did not see any relief therefore he  discontinued.  No other complaints.   INTERVAL HISTORY 06/15/2022:  Patient presents today for follow-up, he presents alone.  At last visit due to his bradycardia we defer starting Aricept or any medications until completion of the neuropsych testing.  He reported after going home and reading about the medication, he would like to start it now.  I did inform him that his bradycardia is a relative contraindication to Aricept but we can start rivastigmine. He is comfortable with plan. His neuropsych testing is scheduled for January.  He also mentioned that since starting the pregabalin, now when walking he will have cramp-like pain in bilateral calf and his nighttime pain is not well controlled.    INTERVAL HISTORY 03/07/2022:  Patient presents today for follow-up, at last visit plan was to start pregabalin for his neuropathic pain.  He reported the pain improved.  Today he is concerned for his memory.  He reported he has issue with recall, his long-term memory and short-term memory are intact.  Wife thinks he does not do all the tasks he supposed to do, he does forget a lot.  There was time at home where he forget and left the stove on, burning some pots.  He still drives, does not have any recent accident or being lost in family or places.  He reported mother had a history of Alzheimer's disease, diagnosed in the 45s.  He still independent, able to for perform all ADLs and a IADLs.  Denies any word finding difficulty denies forgetting names of family members.  Wife took  over the finances a few years ago because he did forget to pay bills on time.   HISTORY OF PRESENT ILLNESS:  This is a 78 year old gentleman with past medical history of hypertension hyperlipidemia, previously atrial fibrillation status post ablation who was referred by PMD for TIA work up.  Patient reports 6 weeks ago while at a computer desk he noted  binocular double vision, on closing 1 eye his vision will be back to normal but with  both eyes he will have double vision.  The entire episode lasted about 3 minutes then resolved.  There were no associated symptoms with the blurry vision, denies any headaches, denies any numbness, no weakness and no slurred speech.  He never experienced an episode like that in the past and has not had any additional episodes.  He did follow-up with his primary care doctor who obtained a stroke labs and refer him to neurology for TIA work-up.  His lipid panel was within normal limits and a hemoglobin A1c was 5.7.  He denies any previous history of strokes.     OTHER MEDICAL CONDITIONS: HLD, HTN, CAD   REVIEW OF SYSTEMS: Full 14 system review of systems performed and negative with exception of: as noted in the HPI   ALLERGIES: Allergies  Allergen Reactions   Penicillin G Rash   Quinolones Other (See Comments)    Other reaction(s): Other (See Comments) Fluroquinolone antibiotics should be avoided in patients with history of aortic aneurysm/dissection   Amoxil [Amoxicillin] Rash    HOME MEDICATIONS: Outpatient Medications Prior to Visit  Medication Sig Dispense Refill   aspirin 81 MG EC tablet Take 1 tablet (81 mg total) by mouth daily. Swallow whole. 30 tablet 12   atorvastatin (LIPITOR) 80 MG tablet Take 1 tablet (80 mg total) by mouth daily. 90 tablet 3   cyanocobalamin (VITAMIN B12) 100 MCG tablet Take 100 mcg by mouth daily.     lisinopril (ZESTRIL) 5 MG tablet Take 1 tablet (5 mg total) by mouth daily. 90 tablet 3   meloxicam (MOBIC) 15 MG tablet Take 15 mg by mouth as needed for pain. TAKE 0.5-1 tablet  AS NEEDED     metoprolol succinate (TOPROL-XL) 25 MG 24 hr tablet Take 2 tablets (50 mg total) by mouth every morning AND 0.5 tablets (12.5 mg total) every evening. 225 tablet 3   rivastigmine (EXELON) 3 MG capsule Take 1 capsule (3 mg total) by mouth 2 (two) times daily. 180 capsule 3   silodosin (RAPAFLO) 8 MG CAPS capsule Take 1 capsule (8 mg total) by mouth daily with breakfast. 90  capsule 3   VITAMIN D PO Take 1 tablet by mouth daily in the afternoon.     zolpidem (AMBIEN) 5 MG tablet TAKE 1 TABLET BY MOUTH AT BEDTIME AS NEEDED FOR SLEEP. 30 tablet 0   No facility-administered medications prior to visit.    PAST MEDICAL HISTORY: Past Medical History:  Diagnosis Date   Abdominal pain 04/01/2013   Acute bronchitis 03/07/2018   Allergic rhinitis 05/12/2008   Aortic atherosclerosis 08/23/2020   Arthritis    Ascending aortic aneurysm 12/13/2018   Atrial fibrillation with RVR 10/21/2012   Echo- EF 50-55%; mild concentric LVH; flow pattern suggestive of impaired LV relaxation; mild mitral valve prolapse, trace mitral regurgitation   Back pain    receiving PT   Benign prostatic hyperplasia 09/05/2012   Boil of buttock 06/24/2011   Bradycardia 04/17/2013   Cataract    Cellulitis of knee, left 04/12/2016  Chest pain with exertion presumed to be tachycardia related along with SOB 04/01/2013   Degeneration of lumbar intervertebral disc 12/10/2020   Eustachian tube dysfunction, bilateral 03/07/2018   External otitis of left ear 06/11/2020   Fatigue 07/04/2011   GERD (gastroesophageal reflux disease) 03/24/2009   Heart murmur    Hematuria, possible 04/01/2013   Hereditary and idiopathic peripheral neuropathy 05/12/2008   HLD (hyperlipidemia) 08/18/2007   HTN (hypertension) 12/04/2013   Insomnia 06/08/2016   Left knee pain 12/13/2018   Low back pain 12/01/2020   Lumbar spondylosis 05/05/2021   Mild cognitive impairment of uncertain or unknown etiology 12/09/2022   Mitral valve prolapse 07/04/2011   MRSA (methicillin resistant staph aureus) culture positive 5+ years ago   Neck pain 05/05/2021   OSA (obstructive sleep apnea) 07/04/2011   Using CPAP nightly   Pain in right hand 02/04/2020   Palpitations 03/06/2007   R/P MV - mild perfusion defect in basal inferoseptal, basal inferior, mid inferoseptal, and mid inferior regions, consistent w/ infarct/scar; no  scintigraphic evidence of inducible myocardial ischemia; prior non transmural infarct cannot be completely excluded; EF 48%; no significant change from previous study   Personal history of colonic polyps 05/12/2008   Radicular pain in left arm 05/05/2021   Sacroiliitis 05/05/2021   Stricture and stenosis of esophagus 03/24/2009   TIA (transient ischemic attack) 11/22/2021   Tinnitus 09/05/2012   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   Vitamin B12 deficiency 06/15/2019   Vitamin D deficiency 05/23/2022    PAST SURGICAL HISTORY: Past Surgical History:  Procedure Laterality Date   ATRIAL FIBRILLATION ABLATION     CARDIOVERSION N/A 04/03/2013   Procedure: CARDIOVERSION;  Surgeon: Chrystie Nose, MD;  Location: [facility] ENDOSCOPY;  Service: Cardiovascular;  Laterality: N/A;   COLONOSCOPY  2018   HAND SURGERY     Thumb joint repair   POLYPECTOMY     TEE WITHOUT CARDIOVERSION N/A 04/03/2013   Procedure: TRANSESOPHAGEAL ECHOCARDIOGRAM (TEE);  Surgeon: Chrystie Nose, MD;  Location: [facility] ENDOSCOPY;  Service: Cardiovascular;  Laterality: N/A;   TONSILLECTOMY AND ADENOIDECTOMY     UPPER GASTROINTESTINAL ENDOSCOPY     dilation    FAMILY HISTORY: Family History  Problem Relation Age of Onset   Dementia Mother        late 42s   Alzheimer's disease Mother    Heart disease Father 24       died with MI   Melanoma Sister    Breast cancer Maternal Grandmother    Stroke Maternal Grandfather    Colon cancer Neg Hx    Esophageal cancer Neg Hx    Stomach cancer Neg Hx    Rectal cancer Neg Hx    Colon polyps Neg Hx     SOCIAL HISTORY: 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  Tobacco Use   Smoking  status: Never   Smokeless tobacco: Never  Vaping Use   Vaping status: Never Used  Substance and Sexual Activity   Alcohol use: Not Currently    Alcohol/week: 0.0 - 1.0 standard drinks of alcohol    Comment: infrequent glass of wine   Drug use: No   Sexual activity: Not on  file  Other Topics Concern   Not on file  Social History Narrative   Not on file   Social Drivers of Health   Financial Resource Strain: Low Risk  (09/21/2023)   Overall Financial Resource Strain (CARDIA)    Difficulty of Paying Living Expenses: Not hard at all  Food Insecurity: No Food Insecurity (09/21/2023)   Hunger Vital Sign    Worried About Running Out of Food in the Last Year: Never true    Ran Out of Food in the Last Year: Never true  Transportation Needs: No Transportation Needs (09/21/2023)   PRAPARE - Administrator, Civil Service (Medical): No    Lack of Transportation (Non-Medical): No  Physical Activity: Inactive (09/21/2023)   Exercise Vital Sign    Days of Exercise per Week: 0 days    Minutes of Exercise per Session: 0 min  Stress: No Stress Concern Present (09/21/2023)   [employer] of Occupational Health - Occupational Stress Questionnaire    Feeling of Stress : Not at all  Social Connections: Unknown (09/21/2023)   Social Connection and Isolation Panel [NHANES]    Frequency of Communication with Friends and Family: More than three times a week    Frequency of Social Gatherings with Friends and Family: More than three times a week    Attends Religious Services: Not on file    Active Member of Clubs or Organizations: Yes    Attends Banker Meetings: More than 4 times per year    Marital Status: Married  Catering manager Violence: Not At Risk (09/21/2023)   Humiliation, Afraid, Rape, and Kick questionnaire    Fear of Current or Ex-Partner: No    Emotionally Abused: No    Physically Abused: No    Sexually Abused: No     PHYSICAL EXAM  GENERAL  EXAM/CONSTITUTIONAL: Vitals:  There were no vitals filed for this visit.    There is no height or weight on file to calculate BMI. Wt Readings from Last 3 Encounters:  12/14/23 237 lb (107.5 kg)  11/22/23 239 lb (108.4 kg)  11/21/23 238 lb 12.8 oz (108.3 kg)   Patient is in no distress; well developed, nourished and groomed; neck is supple  MUSCULOSKELETAL: Gait, strength, tone, movements noted in Neurologic exam below  NEUROLOGIC: MENTAL STATUS:     09/21/2023    2:00 PM  MMSE - Mini Mental State Exam  Not completed: Unable to complete   awake, alert, oriented to person, place and time recent and remote memory intact normal attention and concentration language fluent, comprehension intact, naming intact fund of knowledge appropriate     05/03/2023    3:28 PM 03/07/2022   10:21 AM  Montreal Cognitive Assessment   Visuospatial/ Executive (0/5) 5 4  Naming (0/3) 3 2  Attention: Read list of digits (0/2) 2 2  Attention: Read list of letters (0/1) 1 1  Attention: Serial 7 subtraction starting at 100 (0/3) 3 2  Language: Repeat phrase (0/2) 2 1  Language : Fluency (0/1) 1 1  Abstraction (0/2) 2 2  Delayed Recall (0/5) 3 0  Orientation (0/6) 6 6  Total 28 21  Adjusted Score (based on education)  21    CRANIAL NERVE:  2nd, 3rd, 4th, 6th - visual fields full to confrontation, extraocular muscles intact, no nystagmus 5th - facial sensation symmetric 7th - facial strength symmetric 8th - hearing intact 9th - palate elevates symmetrically, uvula midline 11th - shoulder shrug symmetric 12th -  tongue protrusion midline  MOTOR:  normal bulk and tone, full strength in the BUE, BLE  SENSORY:  normal and symmetric to light touch  COORDINATION:  finger-nose-finger, fine finger movements normal  REFLEXES:  deep tendon reflexes present and symmetric  GAIT/STATION:  normal   DIAGNOSTIC DATA (LABS, IMAGING, TESTING) - I reviewed patient records, labs, notes, testing  and imaging myself where available.  Lab Results  Component Value Date   WBC 3.8 (L) 11/22/2023   HGB 14.0 11/22/2023   HCT 42.0 11/22/2023   MCV 92.2 11/22/2023   PLT 203.0 11/22/2023      Component Value Date/Time   NA 142 11/22/2023 1128   NA 140 09/18/2020 1038   K 4.1 11/22/2023 1128   CL 108 11/22/2023 1128   CO2 29 11/22/2023 1128   GLUCOSE 94 11/22/2023 1128   BUN 15 11/22/2023 1128   BUN 12 09/18/2020 1038   CREATININE 1.09 11/22/2023 1128   CREATININE 1.13 03/01/2017 0001   CALCIUM 9.3 11/22/2023 1128   PROT 6.1 11/22/2023 1128   PROT 6.7 09/18/2020 1038   ALBUMIN 4.0 11/22/2023 1128   ALBUMIN 4.3 09/18/2020 1038   AST 24 11/22/2023 1128   ALT 25 11/22/2023 1128   ALKPHOS 62 11/22/2023 1128   BILITOT 0.9 11/22/2023 1128   BILITOT 0.6 09/18/2020 1038   GFRNONAA 65 09/18/2020 1038   GFRAA 75 09/18/2020 1038   Lab Results  Component Value Date   CHOL 94 11/22/2023   HDL 40.60 11/22/2023   LDLCALC 38 11/22/2023   TRIG 79.0 11/22/2023   CHOLHDL 2 11/22/2023   Lab Results  Component Value Date   HGBA1C 5.7 11/22/2023   Lab Results  Component Value Date   VITAMINB12 1,179 (H) 11/22/2023   Lab Results  Component Value Date   TSH 2.07 11/22/2023    MRI Brain wo contrast 12/29/2021 1. No acute intracranial pathology.  No evidence of recent infarct. 2. Mild parenchymal volume loss and chronic white matter microangiopathy  PET Amyloid 04/06/2023 Scan is POSITIVE for brain amyloid and is most consistent with the presence of moderate to frequent neuritic beta-amyloid plaques in the brain.   ASSESSMENT AND PLAN  78 y.o. year old male with atrial fibrillation status post ablation, hypertension, hyperlipidemia, mild cognitive impairment due to AD who is presenting for follow up.  His ATN profile was postive for AD biomarker's.  His amyloid PET scan was also positive.  At the moment I will obtain an AOPE 4 genotype and will start the paperwork to start patient  on Leqembi.  I will contact him to let him know the first day of infusion.  Continue follow-up with PCP and return sooner if worse.     No diagnosis found.     There are no Patient Instructions on file for this visit.  No orders of the defined types were placed in this encounter.   No orders of the defined types were placed in this encounter.   No follow-ups on file.    Otila Kluver, DNP  [facility] [address] [place], [place] [address] [phone]

## 2023-12-21 ENCOUNTER — Ambulatory Visit: Payer: Medicare Other | Admitting: Neurology

## 2023-12-26 ENCOUNTER — Ambulatory Visit (INDEPENDENT_AMBULATORY_CARE_PROVIDER_SITE_OTHER): Payer: Medicare Other | Admitting: Neurology

## 2023-12-26 ENCOUNTER — Encounter: Payer: Self-pay | Admitting: Neurology

## 2023-12-26 VITALS — BP 127/80 | HR 78 | Resp 16 | Ht 76.0 in

## 2023-12-26 DIAGNOSIS — F0280 Dementia in other diseases classified elsewhere without behavioral disturbance: Secondary | ICD-10-CM | POA: Insufficient documentation

## 2023-12-26 DIAGNOSIS — G300 Alzheimer's disease with early onset: Secondary | ICD-10-CM

## 2023-12-26 DIAGNOSIS — F02A0 Dementia in other diseases classified elsewhere, mild, without behavioral disturbance, psychotic disturbance, mood disturbance, and anxiety: Secondary | ICD-10-CM | POA: Diagnosis not present

## 2023-12-26 NOTE — Progress Notes (Signed)
[facility]  PATIENT: Dustin Berry. DOB: 1946/11/14  REQUESTING CLINICIAN: Corwin Levins, MD HISTORY FROM: Patient and Spouse  REASON FOR VISIT: Memory problem   HISTORICAL  CHIEF COMPLAINT:  Chief Complaint  Patient presents with   Follow-up    RM16, ALONE, MCI// needs in order to restart lequembi: PT STATED ONLY ISSUE IS WEIGHT GAIN, MOCA 25, FUNCTIONAL ACTIVITIES QUESTIONS ALSO COMPLETED. WILL NEED THESE TO RESTART   Today December 21, 2023 SS: Has been on Leqembi since 06/01/23 with infusions every 2 weeks, most recent was 12/20/23. Will need new authorization before any more infusions.  Has not had any issues, has cardiac palpations often, on metoprolol. Today MOCA 25/30. He feels memory is sharper, remembering appointments more frequently with times. Still has issues with word recall. Remains on Exelon 3 mg BID. No health changes. Drives a car, does all cooking, grocery shopping. 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 Mentions weight gain, looks similar from Feb 2024. Wife is taking over the bills, likely due to his memory also she has accounting background. MOCA 25/30, functional activities questionnaire 0. Mentions over a year, weakness to right calf known history of peripheral neuropathy.   -MRI of the brain 07/20/2023 stable minimal chronic microvascular ischemic change, mild generalized cortical atrophy.  No ARIA. -MRI of the brain 08/22/2023 was stable, no ARIA. -MRI of the brain 11/25/2023 stable, no ARIA  INTERVAL HISTORY 05/03/2023:  Patient presents today for follow-up, last visit was in February.  Since then he reports that he has been stable.  He did follow-up with the [place] but was not included in the clinical trial.  He is still interested in starting [place].  No other complaints or concerns.  INTERVAL HISTORY 01/31/2023:  Patient presents today for follow-up, he is accompanied by wife.  He did have  some questions regarding a clinical trial in [place] Clinical biochemist and Feasibility of [employer] Disruption for Mild Cognitive Impairment or Mild Alzheimer's Disease Undergoing Standard of Care Monoclonal Antibody (mAb) Therapy).   They are requesting him to have a amyloid PET scan or a CSF amyloid test.  He is doing well with the Exelon so far.  No new symptoms.  INTERVAL HISTORY 01/16/2023:  Patient presents today for follow-up, last visit was in July.  At that time, he followed up with Dr. Milbert Coulter neuropsych, had a full test completed and he was diagnosed with mild cognitive impairment.  He reports that his symptoms are still the same.  They are not getting worse.  Currently he is on Exelon 3 mg twice daily.  For his neuropathic pain, he did try pregabalin but did not see any relief therefore he discontinued.  No other complaints.   INTERVAL HISTORY 06/15/2022:  Patient presents today for follow-up, he presents alone.  At last visit due to his bradycardia we defer starting Aricept or any medications until completion of the neuropsych testing.  He reported after going home and reading about the medication, he would like to start it now.  I did inform him that his bradycardia is a relative contraindication to Aricept but we can start rivastigmine. He is comfortable with plan. His neuropsych testing is scheduled for January.  He also mentioned that since starting the pregabalin, now when walking he will have cramp-like pain in bilateral calf and his nighttime pain is not well controlled.    INTERVAL HISTORY 03/07/2022:  Patient presents today for follow-up, at last visit  plan was to start pregabalin for his neuropathic pain.  He reported the pain improved.  Today he is concerned for his memory.  He reported he has issue with recall, his long-term memory and short-term memory are intact.  Wife thinks he does not do all the tasks he supposed to do, he does forget a lot.  There was time at home  where he forget and left the stove on, burning some pots.  He still drives, does not have any recent accident or being lost in family or places.  He reported mother had a history of Alzheimer's disease, diagnosed in the 65s.  He still independent, able to for perform all ADLs and a IADLs.  Denies any word finding difficulty denies forgetting names of family members.  Wife took over the finances a few years ago because he did forget to pay bills on time.   HISTORY OF PRESENT ILLNESS:  This is a 78 year old gentleman with past medical history of hypertension hyperlipidemia, previously atrial fibrillation status post ablation who was referred by PMD for TIA work up.  Patient reports 6 weeks ago while at a computer desk he noted  binocular double vision, on closing 1 eye his vision will be back to normal but with both eyes he will have double vision.  The entire episode lasted about 3 minutes then resolved.  There were no associated symptoms with the blurry vision, denies any headaches, denies any numbness, no weakness and no slurred speech.  He never experienced an episode like that in the past and has not had any additional episodes.  He did follow-up with his primary care doctor who obtained a stroke labs and refer him to neurology for TIA work-up.  His lipid panel was within normal limits and a hemoglobin A1c was 5.7.  He denies any previous history of strokes.     OTHER MEDICAL CONDITIONS: HLD, HTN, CAD   REVIEW OF SYSTEMS: Full 14 system review of systems performed and negative with exception of: as noted in the HPI   ALLERGIES: Allergies  Allergen Reactions   Penicillin G Rash   Quinolones Other (See Comments)    Other reaction(s): Other (See Comments) Fluroquinolone antibiotics should be avoided in patients with history of aortic aneurysm/dissection   Amoxil [Amoxicillin] Rash    HOME MEDICATIONS: Outpatient Medications Prior to Visit  Medication Sig Dispense Refill   aspirin 81 MG EC  tablet Take 1 tablet (81 mg total) by mouth daily. Swallow whole. 30 tablet 12   atorvastatin (LIPITOR) 80 MG tablet Take 1 tablet (80 mg total) by mouth daily. 90 tablet 3   cyanocobalamin (VITAMIN B12) 100 MCG tablet Take 100 mcg by mouth daily.     lisinopril (ZESTRIL) 5 MG tablet Take 1 tablet (5 mg total) by mouth daily. 90 tablet 3   meloxicam (MOBIC) 15 MG tablet Take 15 mg by mouth as needed for pain. TAKE 0.5-1 tablet  AS NEEDED     metoprolol succinate (TOPROL-XL) 25 MG 24 hr tablet Take 2 tablets (50 mg total) by mouth every morning AND 0.5 tablets (12.5 mg total) every evening. 225 tablet 3   rivastigmine (EXELON) 3 MG capsule Take 1 capsule (3 mg total) by mouth 2 (two) times daily. 180 capsule 3   silodosin (RAPAFLO) 8 MG CAPS capsule Take 1 capsule (8 mg total) by mouth daily with breakfast. 90 capsule 3   VITAMIN D PO Take 1 tablet by mouth daily in the afternoon.     zolpidem (  AMBIEN) 5 MG tablet TAKE 1 TABLET BY MOUTH AT BEDTIME AS NEEDED FOR SLEEP. 30 tablet 0   No facility-administered medications prior to visit.    PAST MEDICAL HISTORY: Past Medical History:  Diagnosis Date   Abdominal pain 04/01/2013   Acute bronchitis 03/07/2018   Allergic rhinitis 05/12/2008   Aortic atherosclerosis 08/23/2020   Arthritis    Ascending aortic aneurysm 12/13/2018   Atrial fibrillation with RVR 10/21/2012   Echo- EF 50-55%; mild concentric LVH; flow pattern suggestive of impaired LV relaxation; mild mitral valve prolapse, trace mitral regurgitation   Back pain    receiving PT   Benign prostatic hyperplasia 09/05/2012   Boil of buttock 06/24/2011   Bradycardia 04/17/2013   Cataract    Cellulitis of knee, left 04/12/2016   Chest pain with exertion presumed to be tachycardia related along with SOB 04/01/2013   Degeneration of lumbar intervertebral disc 12/10/2020   Eustachian tube dysfunction, bilateral 03/07/2018   External otitis of left ear 06/11/2020   Fatigue 07/04/2011    GERD (gastroesophageal reflux disease) 03/24/2009   Heart murmur    Hematuria, possible 04/01/2013   Hereditary and idiopathic peripheral neuropathy 05/12/2008   HLD (hyperlipidemia) 08/18/2007   HTN (hypertension) 12/04/2013   Insomnia 06/08/2016   Left knee pain 12/13/2018   Low back pain 12/01/2020   Lumbar spondylosis 05/05/2021   Mild cognitive impairment of uncertain or unknown etiology 12/09/2022   Mitral valve prolapse 07/04/2011   MRSA (methicillin resistant staph aureus) culture positive 5+ years ago   Neck pain 05/05/2021   OSA (obstructive sleep apnea) 07/04/2011   Using CPAP nightly   Pain in right hand 02/04/2020   Palpitations 03/06/2007   R/P MV - mild perfusion defect in basal inferoseptal, basal inferior, mid inferoseptal, and mid inferior regions, consistent w/ infarct/scar; no scintigraphic evidence of inducible myocardial ischemia; prior non transmural infarct cannot be completely excluded; EF 48%; no significant change from previous study   Personal history of colonic polyps 05/12/2008   Radicular pain in left arm 05/05/2021   Sacroiliitis 05/05/2021   Stricture and stenosis of esophagus 03/24/2009   TIA (transient ischemic attack) 11/22/2021   Tinnitus 09/05/2012   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   Vitamin B12 deficiency 06/15/2019   Vitamin D deficiency 05/23/2022    PAST SURGICAL HISTORY: Past Surgical History:  Procedure Laterality Date   ATRIAL FIBRILLATION ABLATION     CARDIOVERSION N/A 04/03/2013   Procedure: CARDIOVERSION;  Surgeon: Chrystie Nose, MD;  Location: [facility] ENDOSCOPY;  Service: Cardiovascular;  Laterality: N/A;   COLONOSCOPY  2018   HAND SURGERY     Thumb joint repair   POLYPECTOMY     TEE WITHOUT CARDIOVERSION N/A 04/03/2013   Procedure: TRANSESOPHAGEAL ECHOCARDIOGRAM (TEE);  Surgeon: Chrystie Nose, MD;  Location: [facility] ENDOSCOPY;  Service: Cardiovascular;  Laterality: N/A;   TONSILLECTOMY AND ADENOIDECTOMY     UPPER GASTROINTESTINAL  ENDOSCOPY     dilation    FAMILY HISTORY: Family History  Problem Relation Age of Onset   Dementia Mother        late 44s   Alzheimer's disease Mother    Heart disease Father 28       died with MI   Melanoma Sister    Breast cancer Maternal Grandmother    Stroke Maternal Grandfather    Colon cancer Neg Hx    Esophageal cancer Neg Hx    Stomach cancer Neg Hx    Rectal cancer Neg Hx    Colon polyps  Neg Hx     SOCIAL HISTORY: 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  Tobacco Use   Smoking status: Never   Smokeless tobacco: Never  Vaping Use   Vaping status: Never Used  Substance and Sexual Activity   Alcohol use: Not Currently    Alcohol/week: 0.0 - 1.0 standard drinks of alcohol    Comment: infrequent glass of wine   Drug use: No   Sexual activity: Not on file  Other Topics Concern   Not on file  Social History Narrative   Not on file   Social Drivers of Health   Financial Resource Strain: Low Risk  (09/21/2023)   Overall Financial Resource Strain (CARDIA)    Difficulty of Paying Living Expenses: Not hard at all  Food Insecurity: No Food Insecurity (09/21/2023)   Hunger Vital Sign    Worried About Running Out of Food in the Last Year: Never true    Ran Out of Food in the Last Year: Never true  Transportation Needs: No Transportation Needs (09/21/2023)   PRAPARE - Administrator, Civil Service (Medical): No    Lack of Transportation (Non-Medical): No  Physical Activity: Inactive (09/21/2023)   Exercise Vital Sign    Days of Exercise per Week: 0 days    Minutes of Exercise per Session: 0 min  Stress: No Stress Concern Present (09/21/2023)   [employer] of Occupational Health - Occupational Stress Questionnaire    Feeling of Stress : Not at all  Social Connections: Unknown (09/21/2023)   Social Connection and Isolation Panel [NHANES]    Frequency of Communication with Friends and Family: More than three times a week    Frequency of Social Gatherings with Friends and Family: More than three times a week    Attends Religious Services: Not on file    Active Member of Clubs or Organizations: Yes    Attends Banker Meetings: More than 4 times per year    Marital Status: Married  Catering manager Violence: Not At Risk (09/21/2023)   Humiliation, Afraid, Rape, and Kick questionnaire    Fear of Current or Ex-Partner: No    Emotionally Abused: No    Physically Abused: No    Sexually Abused: No     PHYSICAL EXAM  GENERAL EXAM/CONSTITUTIONAL: Vitals:  Vitals:   12/26/23 0842  BP: 127/80  Pulse: 78  Resp: 16  Height: 6\' 4"  (1.93 m)      Body mass index is 28.85 kg/m. Wt Readings from Last 3 Encounters:  12/14/23 237 lb (107.5 kg)  11/22/23 239 lb (108.4 kg)  11/21/23 238 lb 12.8 oz (108.3 kg)   Patient is in no distress; well developed, nourished and groomed; neck is supple  MUSCULOSKELETAL: Gait, strength, tone, movements noted in Neurologic exam below  NEUROLOGIC: MENTAL STATUS:     09/21/2023    2:00 PM  MMSE - Mini Mental State Exam  Not completed: Unable to complete   awake, alert, oriented to person, place and time recent and remote memory intact normal attention and concentration language fluent, comprehension intact, naming intact fund of knowledge appropriate     12/26/2023  8:45 AM 05/03/2023    3:28 PM 03/07/2022   10:21 AM  Montreal Cognitive Assessment   Visuospatial/ Executive (0/5) 5 5 4   Naming (0/3) 3 3 2   Attention: Read list of digits (0/2) 2 2 2   Attention: Read list of letters (0/1) 1 1 1   Attention: Serial 7 subtraction starting at 100 (0/3) 3 3 2   Language: Repeat  phrase (0/2) 1 2 1   Language : Fluency (0/1) 1 1 1   Abstraction (0/2) 2 2 2   Delayed Recall (0/5) 1 3 0  Orientation (0/6) 6 6 6   Total 25 28 21   Adjusted Score (based on education)   21    CRANIAL NERVE:  2nd, 3rd, 4th, 6th - visual fields full to confrontation, extraocular muscles intact, no nystagmus 5th - facial sensation symmetric 7th - facial strength symmetric 8th - hearing intact 11th - shoulder shrug symmetric  MOTOR:  normal bulk and tone, full strength in the BUE, BLE. No atrophy noted to right calf.   SENSORY:  normal and symmetric to light touch  COORDINATION:  finger-nose-finger, fine finger movements normal  REFLEXES:  deep tendon reflexes present and symmetric  GAIT/STATION:  Normal, slight limp with the right  DIAGNOSTIC DATA (LABS, IMAGING, TESTING) - I reviewed patient records, labs, notes, testing and imaging myself where available.  Lab Results  Component Value Date   WBC 3.8 (L) 11/22/2023   HGB 14.0 11/22/2023   HCT 42.0 11/22/2023   MCV 92.2 11/22/2023   PLT 203.0 11/22/2023      Component Value Date/Time   NA 142 11/22/2023 1128   NA 140 09/18/2020 1038   K 4.1 11/22/2023 1128   CL 108 11/22/2023 1128   CO2 29 11/22/2023 1128   GLUCOSE 94 11/22/2023 1128   BUN 15 11/22/2023 1128   BUN 12 09/18/2020 1038   CREATININE 1.09 11/22/2023 1128   CREATININE 1.13 03/01/2017 0001   CALCIUM 9.3 11/22/2023 1128   PROT 6.1 11/22/2023 1128   PROT 6.7 09/18/2020 1038   ALBUMIN 4.0 11/22/2023 1128   ALBUMIN 4.3 09/18/2020 1038   AST 24 11/22/2023 1128   ALT 25 11/22/2023 1128   ALKPHOS 62 11/22/2023 1128   BILITOT 0.9 11/22/2023 1128   BILITOT 0.6 09/18/2020 1038   GFRNONAA 65 09/18/2020 1038   GFRAA 75 09/18/2020 1038   Lab Results  Component Value Date   CHOL 94 11/22/2023   HDL 40.60 11/22/2023   LDLCALC 38 11/22/2023   TRIG 79.0 11/22/2023   CHOLHDL 2 11/22/2023   Lab Results  Component Value Date   HGBA1C 5.7 11/22/2023    Lab Results  Component Value Date   VITAMINB12 1,179 (H) 11/22/2023   Lab Results  Component Value Date   TSH 2.07 11/22/2023    MRI Brain wo contrast 12/29/2021 1. No acute intracranial pathology.  No evidence of recent infarct. 2. Mild parenchymal volume loss and chronic white matter microangiopathy  PET Amyloid 04/06/2023 Scan is POSITIVE for brain amyloid and is most consistent with the presence of moderate to frequent neuritic beta-amyloid plaques in the brain.  ASSESSMENT AND PLAN  78 y.o. year old male with atrial fibrillation status post ablation, hypertension, hyperlipidemia, mild cognitive impairment due to AD who is presenting for follow up.  His ATN profile was postive for AD biomarker's.  His amyloid PET scan was also positive.  On Leqembi since 06/01/23.  Has been receiving infusions every 2 weeks. Serial MRI imaging has been negative for ARIA.  Today,  MoCA 25/30.  He has tolerated Leqembi well.  -Overall, seems to have remained fairly stable. Remains independent, drives car, working part time.  -Will continue Leqembi every 2 weeks x 18 months total -Remains on Exelon 3 mg twice daily -Encourage exercise, brain stimulating activity, healthy eating, management of vascular risk factors -We discussed weakness to right calf, offered work up with EMG, patient prefers to monitor for now, ongoing for over a year -Follow-up in 6 months, scheduled in July with Dr. Berline Chough, DNP  [facility] [address] [place], [place] [address] [phone]

## 2023-12-26 NOTE — Patient Instructions (Signed)
Great to meet you today.  We will continue Leqembi every 2 weeks.  You can continue the Exelon.  Please let us know if you experience any side effects from the Leqembi.  Keep follow-up appointment in July with Dr. Teresa Coombs.

## 2024-01-01 IMAGING — MR MR HEAD W/O CM
10 series · 48 of 48 positions shown · non-contrast
Comparison: None.

CLINICAL DATA: TIA 4 weeks ago

EXAM:
MRI HEAD WITHOUT CONTRAST
TECHNIQUE: Multiplanar, multiecho pulse sequences of the brain and surrounding
structures were obtained without intravenous contrast.

[Series 2: T1 · sagittal · 5.0mm · 0.45mm/px · 3 of 21 slices shown]
[im 1/21]
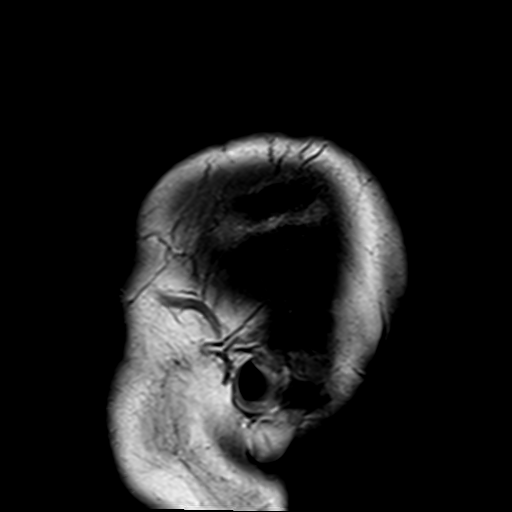
[im 11/21]
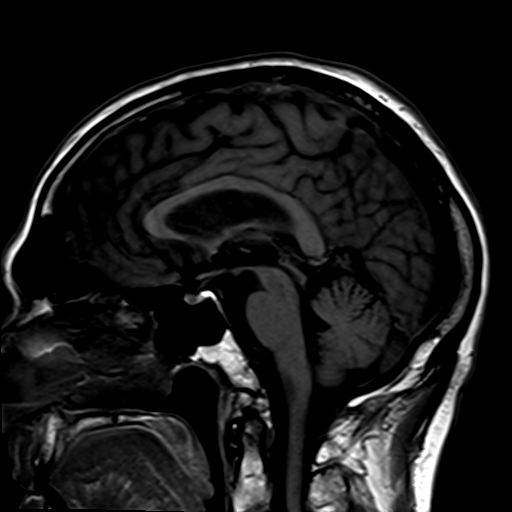
[im 21/21]
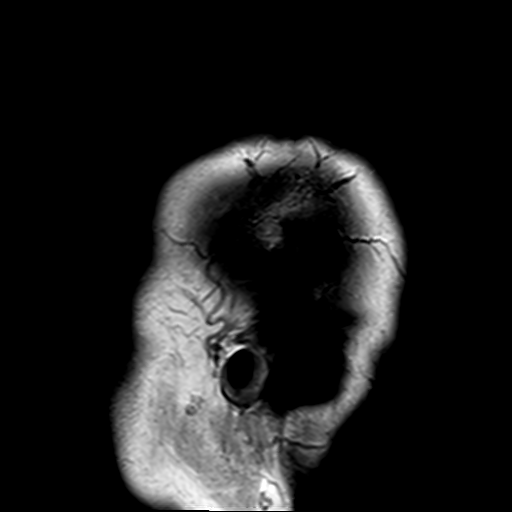

[Series 3: DWI · axial · 3.0mm · 1.80mm/px · z∈[-68,+90]mm · 9 of 106 slices shown (1 of 4)]
[im 1/106]
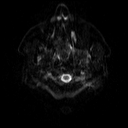
[im 14/106]
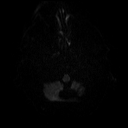
[im 27/106]
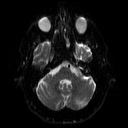
[im 40/106]
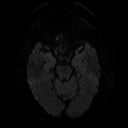
[im 53/106]
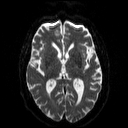
[im 66/106]
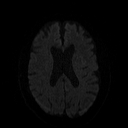
[im 79/106]
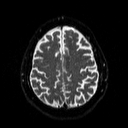
[im 92/106]
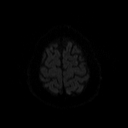
[im 106/106]
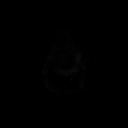

[Series 4: DWI · axial · 3.0mm · 1.80mm/px · z∈[-68,+90]mm · 4 of 51 slices shown (2 of 4)]
[im 1/51]
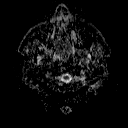
[im 17/51]
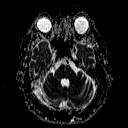
[im 34/51]
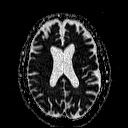
[im 51/51]
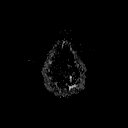

[Series 5: DWI · coronal · 5.0mm · 1.80mm/px · 7 of 78 slices shown (3 of 4)]
[im 1/78]
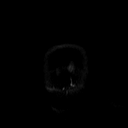
[im 13/78]
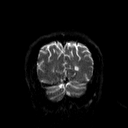
[im 26/78]
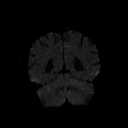
[im 39/78]
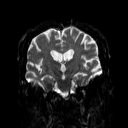
[im 52/78]
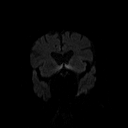
[im 65/78]
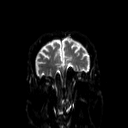
[im 78/78]
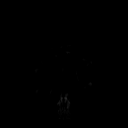

[Series 6: DWI · coronal · 5.0mm · 1.80mm/px · 3 of 39 slices shown (4 of 4)]
[im 1/39]
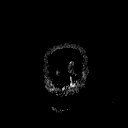
[im 20/39]
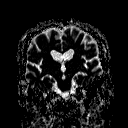
[im 39/39]
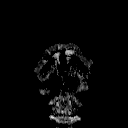

[Series 7: T2 · axial · 5.0mm · 0.60mm/px · z∈[-69,+97]mm · 2 of 25 slices shown (1 of 2)]
[im 1/25]
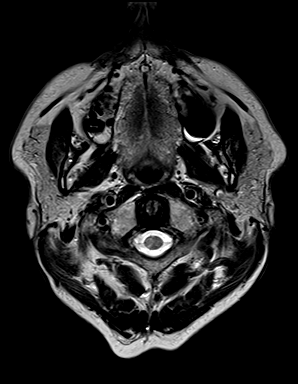
[im 25/25]
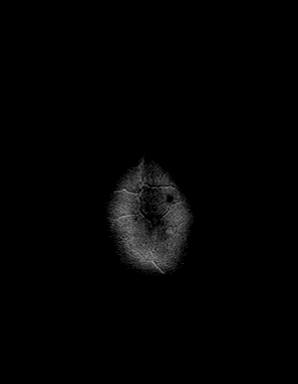

[Series 8: FLAIR · axial · 3.0mm · 0.45mm/px · z∈[-56,+78]mm · 3 of 30 slices shown]
[im 1/30]
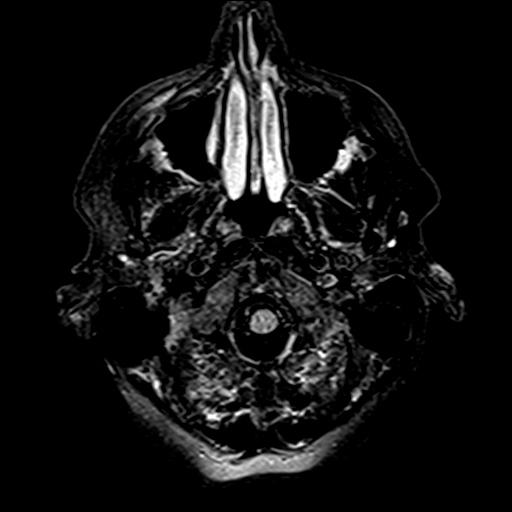
[im 15/30]
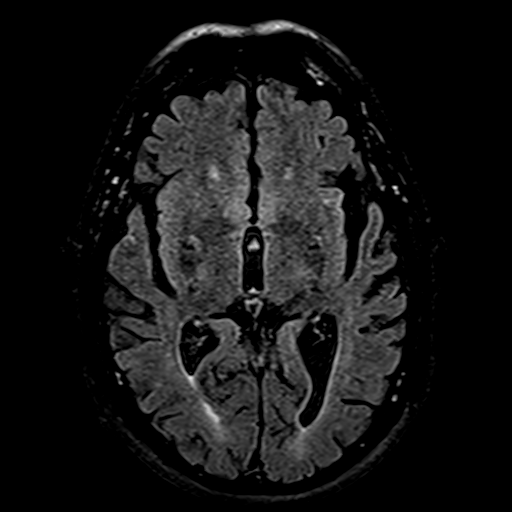
[im 30/30]
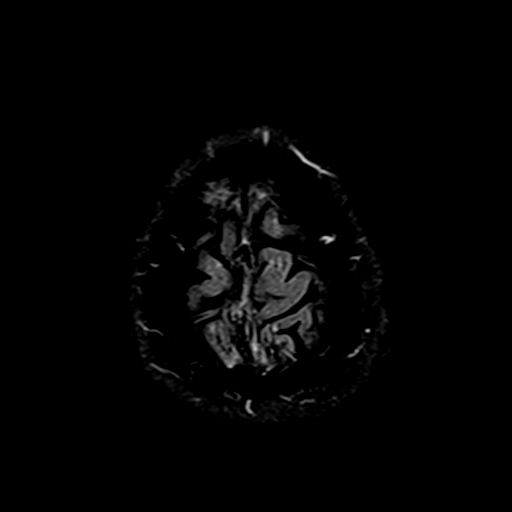

[Series 10: swi_images · axial · 4.0mm · 0.90mm/px · z∈[-65,+89]mm · 3 of 40 slices shown]
[im 1/40]
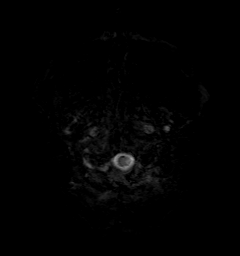
[im 20/40]
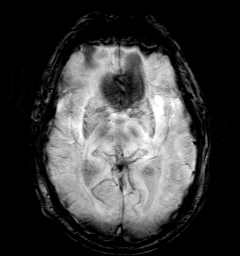
[im 40/40]
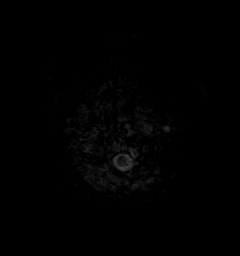

[Series 11: t1_mpr_tra · axial · 1.0mm · 0.71mm/px · z∈[-57,+85]mm · 12 of 144 slices shown]
[im 1/144]
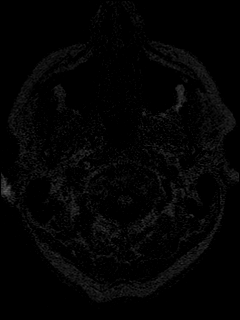
[im 14/144]
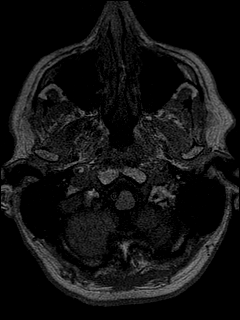
[im 27/144]
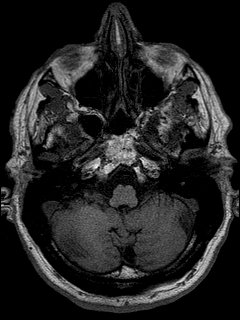
[im 40/144]
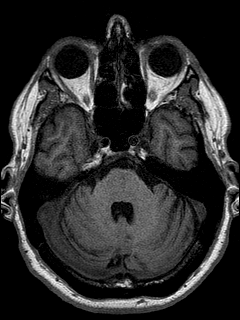
[im 53/144]
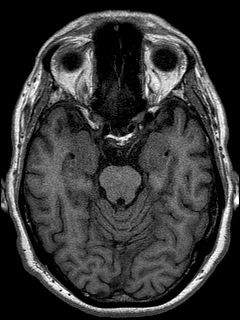
[im 66/144]
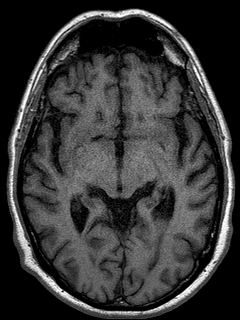
[im 79/144]
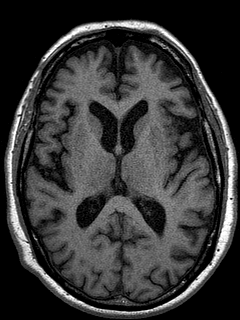
[im 92/144]
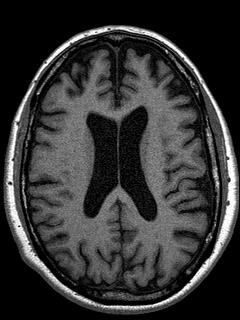
[im 105/144]
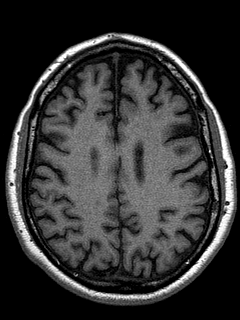
[im 118/144]
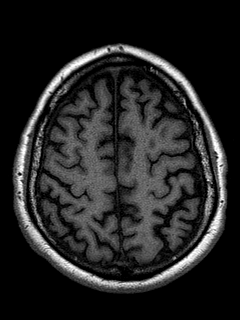
[im 131/144]
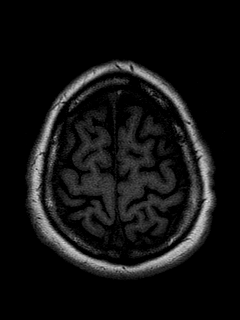
[im 144/144]
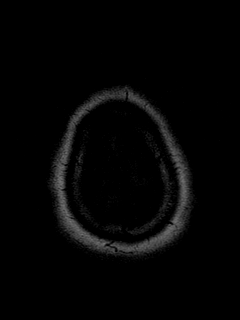

[Series 12: T2 · coronal · 5.0mm · 0.45mm/px · 2 of 29 slices shown (2 of 2)]
[im 1/29]
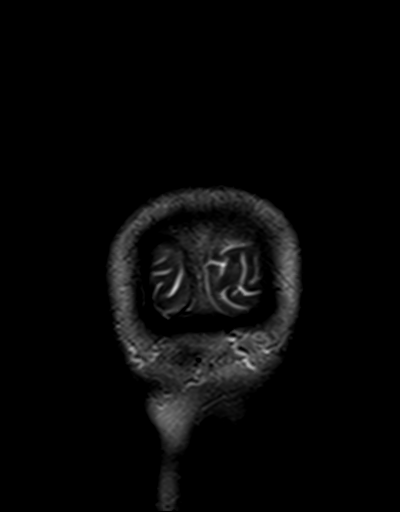
[im 29/29]
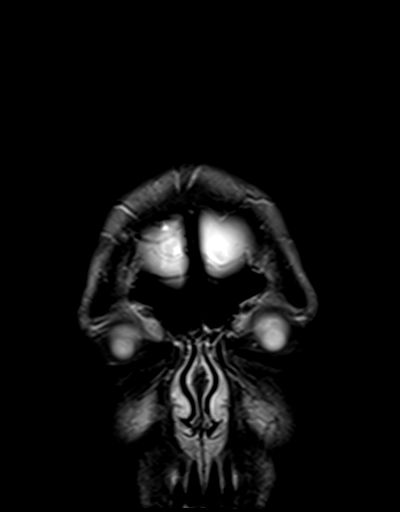

[48 of 48 positions shown; findings below may reference images not displayed]

FINDINGS: Brain: There is no evidence of acute intracranial hemorrhage,
extra-axial fluid collection, or acute infarct. There is no evidence
of recent infarct.

There is a background of mild global parenchymal volume loss with
prominence of the ventricular system and extra-axial CSF spaces.
There are scattered small foci of FLAIR signal abnormality in the
subcortical and periventricular white matter, nonspecific but likely
reflecting sequela of chronic white matter microangiopathy, mild for
age.

There is no suspicious parenchymal signal abnormality. There is no
mass lesion. There is no midline shift.

Vascular: Normal flow voids.

Skull and upper cervical spine: Normal marrow signal.

Sinuses/Orbits: The paranasal sinuses are clear. Bilateral lens
implants are in place. The globes and orbits are otherwise
unremarkable.

Other: None.
IMPRESSION: 1. No acute intracranial pathology.  No evidence of recent infarct.
2. Mild parenchymal volume loss and chronic white matter
microangiopathy.

## 2024-01-14 ENCOUNTER — Encounter: Payer: Self-pay | Admitting: Neurology

## 2024-01-17 ENCOUNTER — Ambulatory Visit: Payer: Medicare HMO | Admitting: Neurology

## 2024-01-22 ENCOUNTER — Encounter: Payer: Self-pay | Admitting: Internal Medicine

## 2024-01-22 ENCOUNTER — Ambulatory Visit: Payer: Medicare Other | Admitting: Internal Medicine

## 2024-01-22 VITALS — BP 118/64 | HR 60 | Temp 98.1°F | Ht 76.0 in | Wt 239.0 lb

## 2024-01-22 DIAGNOSIS — I10 Essential (primary) hypertension: Secondary | ICD-10-CM

## 2024-01-22 DIAGNOSIS — E538 Deficiency of other specified B group vitamins: Secondary | ICD-10-CM | POA: Diagnosis not present

## 2024-01-22 DIAGNOSIS — E559 Vitamin D deficiency, unspecified: Secondary | ICD-10-CM

## 2024-01-22 DIAGNOSIS — R1011 Right upper quadrant pain: Secondary | ICD-10-CM

## 2024-01-22 LAB — CBC WITH DIFFERENTIAL/PLATELET
Basophils Absolute: 0 10*3/uL (ref 0.0–0.1)
Basophils Relative: 0.6 % (ref 0.0–3.0)
Eosinophils Absolute: 0.3 10*3/uL (ref 0.0–0.7)
Eosinophils Relative: 4.9 % (ref 0.0–5.0)
HCT: 46 % (ref 39.0–52.0)
Hemoglobin: 15.4 g/dL (ref 13.0–17.0)
Lymphocytes Relative: 31.6 % (ref 12.0–46.0)
Lymphs Abs: 1.7 10*3/uL (ref 0.7–4.0)
MCHC: 33.4 g/dL (ref 30.0–36.0)
MCV: 91 fL (ref 78.0–100.0)
Monocytes Absolute: 0.7 10*3/uL (ref 0.1–1.0)
Monocytes Relative: 12.6 % — ABNORMAL HIGH (ref 3.0–12.0)
Neutro Abs: 2.8 10*3/uL (ref 1.4–7.7)
Neutrophils Relative %: 50.3 % (ref 43.0–77.0)
Platelets: 210 10*3/uL (ref 150.0–400.0)
RBC: 5.05 Mil/uL (ref 4.22–5.81)
RDW: 14.1 % (ref 11.5–15.5)
WBC: 5.5 10*3/uL (ref 4.0–10.5)

## 2024-01-22 LAB — BASIC METABOLIC PANEL
BUN: 15 mg/dL (ref 6–23)
CO2: 29 meq/L (ref 19–32)
Calcium: 9.5 mg/dL (ref 8.4–10.5)
Chloride: 108 meq/L (ref 96–112)
Creatinine, Ser: 0.97 mg/dL (ref 0.40–1.50)
GFR: 75.73 mL/min (ref >60.00)
Glucose, Bld: 90 mg/dL (ref 70–99)
Potassium: 4.2 meq/L (ref 3.5–5.1)
Sodium: 144 meq/L (ref 135–145)

## 2024-01-22 LAB — HEPATIC FUNCTION PANEL
ALT: 25 U/L (ref 0–53)
AST: 23 U/L (ref 0–37)
Albumin: 4.2 g/dL (ref 3.5–5.2)
Alkaline Phosphatase: 74 U/L (ref 39–117)
Bilirubin, Direct: 0.1 mg/dL (ref 0.0–0.3)
Total Bilirubin: 0.7 mg/dL (ref 0.2–1.2)
Total Protein: 6.8 g/dL (ref 6.0–8.3)

## 2024-01-22 LAB — LIPASE: Lipase: 47 U/L (ref 11.0–59.0)

## 2024-01-22 NOTE — Patient Instructions (Signed)
 Please continue all other medications as before, and refills have been done if requested.  Please have the pharmacy call with any other refills you may need.  Please continue your efforts at being more active, low cholesterol diet, and weight control.  You are otherwise up to date with prevention measures today.  Please keep your appointments with your specialists as you may have planned - you should still follow up with Urology in May 2025 as planned for the kidneys  You will be contacted regarding the referral for: CT scan to be done now instead of May 2025  Please go to the LAB at the blood drawing area for the tests to be done  You will be contacted by phone if any changes need to be made immediately.  Otherwise, you will receive a letter about your results with an explanation, but please check with MyChart first.

## 2024-01-22 NOTE — Progress Notes (Signed)
 Patient ID: Dustin Sniff., male   DOB: 14-Aug-1946, 79 y.o.   MRN: [id number]        Chief Complaint: follow up ruq pain / right side pain,  htn, low vit d and b12       HPI:  Dustin Alesi. is a 78 y.o. male here with c/o 3 mo onset RUQ and right sided pain worse to stand up and twist and turn about, he thought was msk but persists, getting worse, and no obvious trauma or fall skin rash.  Pt denies chest pain, increased sob or doe, wheezing, orthopnea, PND, increased LE swelling, palpitations, dizziness or syncope.   Pt denies polydipsia, polyuria, or new focal neuro s/s.    Pt denies fever, wt loss, night sweats, loss of appetite, or other constitutional symptoms  Denies urinary symptoms such as dysuria, frequency, urgency, flank pain, hematuria or n/v, fever, chills.  Denies worsening reflux, abd pain, dysphagia, n/v, bowel change or blood.  Has urology f/u scheduled for may 2025 and has plan for repeat CT to f/u renal masses.  .         Wt Readings from Last 3 Encounters:  01/22/24 239 lb (108.4 kg)  12/14/23 237 lb (107.5 kg)  11/22/23 239 lb (108.4 kg)   BP Readings from Last 3 Encounters:  01/22/24 118/64  12/26/23 127/80  12/14/23 106/62         Past Medical History:  Diagnosis Date   Abdominal pain 04/01/2013   Acute bronchitis 03/07/2018   Allergic rhinitis 05/12/2008   Aortic atherosclerosis 08/23/2020   Arthritis    Ascending aortic aneurysm 12/13/2018   Atrial fibrillation with RVR 10/21/2012   Echo- EF 50-55%; mild concentric LVH; flow pattern suggestive of impaired LV relaxation; mild mitral valve prolapse, trace mitral regurgitation   Back pain    receiving PT   Benign prostatic hyperplasia 09/05/2012   Boil of buttock 06/24/2011   Bradycardia 04/17/2013   Cataract    Cellulitis of knee, left 04/12/2016   Chest pain with exertion presumed to be tachycardia related along with SOB 04/01/2013   Degeneration of lumbar intervertebral disc 12/10/2020    Eustachian tube dysfunction, bilateral 03/07/2018   External otitis of left ear 06/11/2020   Fatigue 07/04/2011   GERD (gastroesophageal reflux disease) 03/24/2009   Heart murmur    Hematuria, possible 04/01/2013   Hereditary and idiopathic peripheral neuropathy 05/12/2008   HLD (hyperlipidemia) 08/18/2007   HTN (hypertension) 12/04/2013   Insomnia 06/08/2016   Left knee pain 12/13/2018   Low back pain 12/01/2020   Lumbar spondylosis 05/05/2021   Mild cognitive impairment of uncertain or unknown etiology 12/09/2022   Mitral valve prolapse 07/04/2011   MRSA (methicillin resistant staph aureus) culture positive 5+ years ago   Neck pain 05/05/2021   OSA (obstructive sleep apnea) 07/04/2011   Using CPAP nightly   Pain in right hand 02/04/2020   Palpitations 03/06/2007   R/P MV - mild perfusion defect in basal inferoseptal, basal inferior, mid inferoseptal, and mid inferior regions, consistent w/ infarct/scar; no scintigraphic evidence of inducible myocardial ischemia; prior non transmural infarct cannot be completely excluded; EF 48%; no significant change from previous study   Personal history of colonic polyps 05/12/2008   Radicular pain in left arm 05/05/2021   Sacroiliitis 05/05/2021   Stricture and stenosis of esophagus 03/24/2009   TIA (transient ischemic attack) 11/22/2021   Tinnitus 09/05/2012   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   Vitamin B12 deficiency 06/15/2019  Vitamin D deficiency 05/23/2022   Past Surgical History:  Procedure Laterality Date   ATRIAL FIBRILLATION ABLATION     CARDIOVERSION N/A 04/03/2013   Procedure: CARDIOVERSION;  Surgeon: Chrystie Nose, MD;  Location: [facility] ENDOSCOPY;  Service: Cardiovascular;  Laterality: N/A;   COLONOSCOPY  2018   HAND SURGERY     Thumb joint repair   POLYPECTOMY     TEE WITHOUT CARDIOVERSION N/A 04/03/2013   Procedure: TRANSESOPHAGEAL ECHOCARDIOGRAM (TEE);  Surgeon: Chrystie Nose, MD;  Location: [facility] ENDOSCOPY;  Service:  Cardiovascular;  Laterality: N/A;   TONSILLECTOMY AND ADENOIDECTOMY     UPPER GASTROINTESTINAL ENDOSCOPY     dilation    reports that he has never smoked. He has never used smokeless tobacco. He reports that he does not currently use alcohol. He reports that he does not use drugs. family history includes Alzheimer's disease in his mother; Breast cancer in his maternal grandmother; Dementia in his mother; Heart disease (age of onset: 34) in his father; Melanoma in his sister; Stroke in his maternal grandfather. Allergies  Allergen Reactions   Penicillin G Rash   Quinolones Other (See Comments)    Other reaction(s): Other (See Comments) Fluroquinolone antibiotics should be avoided in patients with history of aortic aneurysm/dissection   Amoxil [Amoxicillin] Rash   Current Outpatient Medications on File Prior to Visit  Medication Sig Dispense Refill   aspirin 81 MG EC tablet Take 1 tablet (81 mg total) by mouth daily. Swallow whole. 30 tablet 12   atorvastatin (LIPITOR) 80 MG tablet Take 1 tablet (80 mg total) by mouth daily. 90 tablet 3   cyanocobalamin (VITAMIN B12) 100 MCG tablet Take 100 mcg by mouth daily.     lisinopril (ZESTRIL) 5 MG tablet Take 1 tablet (5 mg total) by mouth daily. 90 tablet 3   meloxicam (MOBIC) 15 MG tablet Take 15 mg by mouth as needed for pain. TAKE 0.5-1 tablet  AS NEEDED     metoprolol succinate (TOPROL-XL) 25 MG 24 hr tablet Take 2 tablets (50 mg total) by mouth every morning AND 0.5 tablets (12.5 mg total) every evening. 225 tablet 3   rivastigmine (EXELON) 3 MG capsule Take 1 capsule (3 mg total) by mouth 2 (two) times daily. 180 capsule 3   silodosin (RAPAFLO) 8 MG CAPS capsule Take 1 capsule (8 mg total) by mouth daily with breakfast. 90 capsule 3   VITAMIN D PO Take 1 tablet by mouth daily in the afternoon.     zolpidem (AMBIEN) 5 MG tablet TAKE 1 TABLET BY MOUTH AT BEDTIME AS NEEDED FOR SLEEP. 30 tablet 0   No current facility-administered medications on  file prior to visit.        ROS:  All others reviewed and negative.  Objective        PE:  BP 118/64 (BP Location: Right Arm, Patient Position: Sitting, Cuff Size: Normal)   Pulse 60   Temp 98.1 F (36.7 C) (Oral)   Ht 6\' 4"  (1.93 m)   Wt 239 lb (108.4 kg)   SpO2 95%   BMI 29.09 kg/m                 Constitutional: Pt appears in NAD               HENT: Head: NCAT.                Right Ear: External ear normal.  Left Ear: External ear normal.                Eyes: . Pupils are equal, round, and reactive to light. Conjunctivae and EOM are normal               Nose: without d/c or deformity               Neck: Neck supple. Gross normal ROM               Cardiovascular: Normal rate and regular rhythm.                 Pulmonary/Chest: Effort normal and breath sounds without rales or wheezing.                Abd:  Soft, NT, ND, + BS, no organomegaly               Neurological: Pt is alert. At baseline orientation, motor grossly intact               Skin: Skin is warm. No rashes, no other new lesions, LE edema - none               Psychiatric: Pt behavior is normal without agitation   Micro: none  Cardiac tracings I have personally interpreted today:  none  Pertinent Radiological findings (summarize): none   Lab Results  Component Value Date   WBC 5.5 01/22/2024   HGB 15.4 01/22/2024   HCT 46.0 01/22/2024   PLT 210.0 01/22/2024   GLUCOSE 90 01/22/2024   CHOL 94 11/22/2023   TRIG 79.0 11/22/2023   HDL 40.60 11/22/2023   LDLCALC 38 11/22/2023   ALT 25 01/22/2024   AST 23 01/22/2024   NA 144 01/22/2024   K 4.2 01/22/2024   CL 108 01/22/2024   CREATININE 0.97 01/22/2024   BUN 15 01/22/2024   CO2 29 01/22/2024   TSH 2.07 11/22/2023   PSA 1.12 11/22/2023   INR 1.19 04/01/2013   HGBA1C 5.7 11/22/2023   Assessment/Plan:  Dustin Yoho. is a 78 y.o. White or Caucasian [1] male with  has a past medical history of Abdominal pain (04/01/2013), Acute  bronchitis (03/07/2018), Allergic rhinitis (05/12/2008), Aortic atherosclerosis (08/23/2020), Arthritis, Ascending aortic aneurysm (12/13/2018), Atrial fibrillation with RVR (10/21/2012), Back pain, Benign prostatic hyperplasia (09/05/2012), Boil of buttock (06/24/2011), Bradycardia (04/17/2013), Cataract, Cellulitis of knee, left (04/12/2016), Chest pain with exertion presumed to be tachycardia related along with SOB (04/01/2013), Degeneration of lumbar intervertebral disc (12/10/2020), Eustachian tube dysfunction, bilateral (03/07/2018), External otitis of left ear (06/11/2020), Fatigue (07/04/2011), GERD (gastroesophageal reflux disease) (03/24/2009), Heart murmur, Hematuria, possible (04/01/2013), Hereditary and idiopathic peripheral neuropathy (05/12/2008), HLD (hyperlipidemia) (08/18/2007), HTN (hypertension) (12/04/2013), Insomnia (06/08/2016), Left knee pain (12/13/2018), Low back pain (12/01/2020), Lumbar spondylosis (05/05/2021), Mild cognitive impairment of uncertain or unknown etiology (12/09/2022), Mitral valve prolapse (07/04/2011), MRSA (methicillin resistant staph aureus) culture positive (5+ years ago), Neck pain (05/05/2021), OSA (obstructive sleep apnea) (07/04/2011), Pain in right hand (02/04/2020), Palpitations (03/06/2007), Personal history of colonic polyps (05/12/2008), Radicular pain in left arm (05/05/2021), Sacroiliitis (05/05/2021), Stricture and stenosis of esophagus (03/24/2009), TIA (transient ischemic attack) (11/22/2021), Tinnitus (09/05/2012), Tuberous sclerosis (06/08/2016), Vitamin B12 deficiency (06/15/2019), and Vitamin D deficiency (05/23/2022).  Vitamin D deficiency Last vitamin D Lab Results  Component Value Date   VD25OH 37.25 11/22/2023   Low, to start oral replacement   Vitamin B12 deficiency Lab Results  Component Value Date   VITAMINB12 1,179 (  H) 11/22/2023   Stable, cont oral replacement - b12 1000 mcg qd   HTN (hypertension) BP Readings from Last 3  Encounters:  01/22/24 118/64  12/26/23 127/80  12/14/23 106/62   Stable, pt to continue medical treatment lisinopril 5 every day, toprol xl 25 every day and 12.5 qhs   Right upper quadrant abdominal pain Exam benign but hx appears significant; for labs and CT abd pelvis , will still need to f/u ROV with urology in may 2025 as well  Followup: Return in about 6 months (around 07/21/2024).  Dustin Barre, MD 01/27/2024 3:30 PM [facility] - [facility] Medicine

## 2024-01-22 NOTE — Progress Notes (Signed)
 The test results show that your current treatment is OK, as the tests are stable.  Please continue the same plan.  There is no other need for change of treatment or further evaluation based on these results, at this time.  thanks

## 2024-01-27 ENCOUNTER — Encounter: Payer: Self-pay | Admitting: Internal Medicine

## 2024-01-27 DIAGNOSIS — R1011 Right upper quadrant pain: Secondary | ICD-10-CM | POA: Insufficient documentation

## 2024-01-27 NOTE — Assessment & Plan Note (Signed)
 Last vitamin D Lab Results  Component Value Date   VD25OH 37.25 11/22/2023   Low, to start oral replacement

## 2024-01-27 NOTE — Assessment & Plan Note (Signed)
 BP Readings from Last 3 Encounters:  01/22/24 118/64  12/26/23 127/80  12/14/23 106/62   Stable, pt to continue medical treatment lisinopril 5 every day, toprol xl 25 every day and 12.5 qhs

## 2024-01-27 NOTE — Assessment & Plan Note (Signed)
 Lab Results  Component Value Date   VITAMINB12 1,179 (H) 11/22/2023   Stable, cont oral replacement - b12 1000 mcg qd

## 2024-01-27 NOTE — Assessment & Plan Note (Addendum)
 Exam benign but hx appears significant; for labs and CT abd pelvis , will still need to f/u ROV with urology in may 2025 as well

## 2024-02-14 ENCOUNTER — Other Ambulatory Visit: Payer: Self-pay | Admitting: Cardiovascular Disease

## 2024-02-20 ENCOUNTER — Ambulatory Visit
Admission: RE | Admit: 2024-02-20 | Discharge: 2024-02-20 | Disposition: A | Discharge status: Home / Self Care | Payer: Medicare Other | Source: Ambulatory Visit | Attending: Internal Medicine | Admitting: Internal Medicine

## 2024-02-20 DIAGNOSIS — R1011 Right upper quadrant pain: Secondary | ICD-10-CM

## 2024-02-20 MED ORDER — IOPAMIDOL (ISOVUE-300) INJECTION 61%
100.0000 mL | Freq: Once | INTRAVENOUS | Status: AC | PRN
  Administered 2024-02-20: 100 mL via INTRAVENOUS

## 2024-02-26 ENCOUNTER — Encounter: Payer: Self-pay | Admitting: Cardiovascular Disease

## 2024-02-27 ENCOUNTER — Encounter: Payer: Self-pay | Admitting: Internal Medicine

## 2024-03-04 DIAGNOSIS — Z4789 Encounter for other orthopedic aftercare: Secondary | ICD-10-CM | POA: Insufficient documentation

## 2024-03-04 DIAGNOSIS — M19032 Primary osteoarthritis, left wrist: Secondary | ICD-10-CM | POA: Insufficient documentation

## 2024-03-18 ENCOUNTER — Telehealth: Payer: Self-pay | Admitting: Internal Medicine

## 2024-03-18 NOTE — Telephone Encounter (Unsigned)
 Copied from CRM [id number]. Topic: General - Other >> Mar 18, 2024  3:23 PM Tiffany S wrote: Reason for CRM: CPAP result 40 events per hour  Supplier suggested patient calls to let the provider know

## 2024-03-18 NOTE — Telephone Encounter (Signed)
 Ok to contact pt - needs f/u with his Sleep apnea provider if not seen recently, or I can refer to Pulmonary if he wants,  thanks

## 2024-04-08 ENCOUNTER — Other Ambulatory Visit: Payer: Self-pay | Admitting: Neurology

## 2024-04-08 MED ORDER — LEQEMBI 200 MG/2ML IV SOLN: 10.0000 mg/kg | INTRAVENOUS | Status: AC

## 2024-05-13 ENCOUNTER — Ambulatory Visit: Payer: Self-pay

## 2024-05-13 NOTE — Telephone Encounter (Signed)
 FYI Only or Action Required?: FYI only for provider  Patient was last seen in primary care on 01/22/2024 by Roslyn Coombe, MD. Called Nurse Triage reporting Elbow Pain. Symptoms began 1 years & worsening x 2 months. Interventions attempted: Nothing. Symptoms are: gradually worsening.  Triage Disposition: See PCP Within 2 Weeks  Patient/caregiver understands and will follow disposition?: Yes   Copied from CRM [id number]. Topic: Clinical - Red Word Triage >> May 13, 2024  2:10 PM [place] E wrote: [facility] that prompted transfer to Nurse Triage: Elbow pain. Patient experiencing left elbow pain for the past year, but worsening over the past couple months. Stated when he tries to lift something up, he drops it from being in pain. Reason for Disposition  [1] MILD pain (e.g., does not interfere with normal activities) AND [2] present > 7 days  Answer Assessment - Initial Assessment Questions 1. ONSET: "When did the pain start?"     1 year and Worsening over x 2 months 2. LOCATION: "Where is the pain located?"     Left elbow 3. PAIN: "How bad is the pain?" (Scale 1-10; or mild, moderate, severe)   - MILD (1-3): doesn't interfere with normal activities.   - MODERATE (4-7): interferes with normal activities (e.g., work or school) or awakens from sleep.   - SEVERE (8-10): excruciating pain, unable to do any normal activities, unable to use arm at all.     Severe, sharp shooting pain that comes and goes - sometimes pain radiates down left arm 4. WORK OR EXERCISE: "Has there been any recent work or exercise that involved this part of the body?"     N/a 5. CAUSE: "What do you think is causing the elbow pain?"     no 6. OTHER SYMPTOMS: "Do you have any other symptoms?" (e.g., neck pain, elbow swelling, rash, fever)     Left pain radiates down arm 7. PREGNANCY: "Is there any chance you are pregnant?" "When was your last menstrual period?"     N/a  Protocols used: Elbow Pain-A-AH

## 2024-05-22 ENCOUNTER — Ambulatory Visit (INDEPENDENT_AMBULATORY_CARE_PROVIDER_SITE_OTHER): Admitting: Internal Medicine

## 2024-05-22 ENCOUNTER — Encounter: Payer: Self-pay | Admitting: Internal Medicine

## 2024-05-22 VITALS — BP 120/78 | HR 59 | Temp 98.6°F | Ht 76.0 in | Wt 233.0 lb

## 2024-05-22 DIAGNOSIS — R269 Unspecified abnormalities of gait and mobility: Secondary | ICD-10-CM | POA: Diagnosis not present

## 2024-05-22 DIAGNOSIS — I10 Essential (primary) hypertension: Secondary | ICD-10-CM | POA: Diagnosis not present

## 2024-05-22 DIAGNOSIS — E559 Vitamin D deficiency, unspecified: Secondary | ICD-10-CM | POA: Diagnosis not present

## 2024-05-22 DIAGNOSIS — E538 Deficiency of other specified B group vitamins: Secondary | ICD-10-CM

## 2024-05-22 DIAGNOSIS — M7712 Lateral epicondylitis, left elbow: Secondary | ICD-10-CM | POA: Diagnosis not present

## 2024-05-22 NOTE — Progress Notes (Unsigned)
 Patient ID: Dustin [name] Jewel Mickey., male   DOB: 1946-10-19, 78 y.o.   MRN: [id number]        Chief Complaint: follow up left lateral epicondylitis, gait disorder, low b12, low Vit d, htn, dementia       HPI:  Dustin Rossa. is a 78 y.o. male here with c/o 1 wk onset left elbow area paiin and mild swelling, worse to grip with the hand and raise the arm,  No trauma.  Pt denies chest pain, increased sob or doe, wheezing, orthopnea, PND, increased LE swelling, palpitations, dizziness or syncope.  Pt denies polydipsia, polyuria, or new focal neuro s/s except for worsening gait difficulty with lower back paain, left > right mild weakness, and tends to stomp with walking now.  Dementia overall stable symptomatically, and not assoc with behavioral changes        Wt Readings from Last 3 Encounters:  05/22/24 233 lb (105.7 kg)  01/22/24 239 lb (108.4 kg)  12/14/23 237 lb (107.5 kg)   BP Readings from Last 3 Encounters:  05/22/24 120/78  01/22/24 118/64  12/26/23 127/80         Past Medical History:  Diagnosis Date   Abdominal pain 04/01/2013   Acute bronchitis 03/07/2018   Allergic rhinitis 05/12/2008   Aortic atherosclerosis 08/23/2020   Arthritis    Ascending aortic aneurysm 12/13/2018   Atrial fibrillation with RVR 10/21/2012   Echo- EF 50-55%; mild concentric LVH; flow pattern suggestive of impaired LV relaxation; mild mitral valve prolapse, trace mitral regurgitation   Back pain    receiving PT   Benign prostatic hyperplasia 09/05/2012   Boil of buttock 06/24/2011   Bradycardia 04/17/2013   Cataract    Cellulitis of knee, left 04/12/2016   Chest pain with exertion presumed to be tachycardia related along with SOB 04/01/2013   Degeneration of lumbar intervertebral disc 12/10/2020   Eustachian tube dysfunction, bilateral 03/07/2018   External otitis of left ear 06/11/2020   Fatigue 07/04/2011   GERD (gastroesophageal reflux disease) 03/24/2009   Heart murmur    Hematuria,  possible 04/01/2013   Hereditary and idiopathic peripheral neuropathy 05/12/2008   HLD (hyperlipidemia) 08/18/2007   HTN (hypertension) 12/04/2013   Insomnia 06/08/2016   Left knee pain 12/13/2018   Low back pain 12/01/2020   Lumbar spondylosis 05/05/2021   Mild cognitive impairment of uncertain or unknown etiology 12/09/2022   Mitral valve prolapse 07/04/2011   MRSA (methicillin resistant staph aureus) culture positive 5+ years ago   Neck pain 05/05/2021   OSA (obstructive sleep apnea) 07/04/2011   Using CPAP nightly   Pain in right hand 02/04/2020   Palpitations 03/06/2007   R/P MV - mild perfusion defect in basal inferoseptal, basal inferior, mid inferoseptal, and mid inferior regions, consistent w/ infarct/scar; no scintigraphic evidence of inducible myocardial ischemia; prior non transmural infarct cannot be completely excluded; EF 48%; no significant change from previous study   Personal history of colonic polyps 05/12/2008   Radicular pain in left arm 05/05/2021   Sacroiliitis 05/05/2021   Stricture and stenosis of esophagus 03/24/2009   TIA (transient ischemic attack) 11/22/2021   Tinnitus 09/05/2012   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   Vitamin B12 deficiency 06/15/2019   Vitamin D  deficiency 05/23/2022   Past Surgical History:  Procedure Laterality Date   ATRIAL FIBRILLATION ABLATION     CARDIOVERSION N/A 04/03/2013   Procedure: CARDIOVERSION;  Surgeon: Vinie KYM Maxcy, MD;  Location: [facility] ENDOSCOPY;  Service: Cardiovascular;  Laterality: N/A;  COLONOSCOPY  2018   HAND SURGERY     Thumb joint repair   POLYPECTOMY     TEE WITHOUT CARDIOVERSION N/A 04/03/2013   Procedure: TRANSESOPHAGEAL ECHOCARDIOGRAM (TEE);  Surgeon: Vinie KYM Maxcy, MD;  Location: [facility] ENDOSCOPY;  Service: Cardiovascular;  Laterality: N/A;   TONSILLECTOMY AND ADENOIDECTOMY     UPPER GASTROINTESTINAL ENDOSCOPY     dilation    reports that he has never smoked. He has never used smokeless tobacco. He  reports that he does not currently use alcohol. He reports that he does not use drugs. family history includes Alzheimer's disease in his mother; Breast cancer in his maternal grandmother; Dementia in his mother; Heart disease (age of onset: 47) in his father; Melanoma in his sister; Stroke in his maternal grandfather. Allergies  Allergen Reactions   Penicillin G Rash   Quinolones Other (See Comments)    Other reaction(s): Other (See Comments) Fluroquinolone antibiotics should be avoided in patients with history of aortic aneurysm/dissection   Amoxil [Amoxicillin] Rash   Current Outpatient Medications on File Prior to Visit  Medication Sig Dispense Refill   aspirin  81 MG EC tablet Take 1 tablet (81 mg total) by mouth daily. Swallow whole. 30 tablet 12   atorvastatin  (LIPITOR) 80 MG tablet Take 1 tablet (80 mg total) by mouth daily. 90 tablet 3   cyanocobalamin  (VITAMIN B12) 100 MCG tablet Take 100 mcg by mouth daily.     Lecanemab -irmb (LEQEMBI ) 200 MG/2ML SOLN Inject 10 mg/kg into the vein every 14 (fourteen) days.     lisinopril  (ZESTRIL ) 5 MG tablet Take 1 tablet (5 mg total) by mouth daily. 90 tablet 3   meloxicam (MOBIC) 15 MG tablet Take 15 mg by mouth as needed for pain. TAKE 0.5-1 tablet  AS NEEDED     metoprolol  succinate (TOPROL -XL) 25 MG 24 hr tablet Take 2 tablets (50 mg total) by mouth every morning AND 0.5 tablets (12.5 mg total) every evening. 225 tablet 3   rivastigmine  (EXELON ) 3 MG capsule Take 1 capsule (3 mg total) by mouth 2 (two) times daily. 180 capsule 3   silodosin  (RAPAFLO ) 8 MG CAPS capsule Take 1 capsule (8 mg total) by mouth daily with breakfast. 90 capsule 3   tamsulosin  (FLOMAX ) 0.4 MG CAPS capsule Take 0.4 mg by mouth daily.     VITAMIN D  PO Take 1 tablet by mouth daily in the afternoon.     zolpidem  (AMBIEN ) 5 MG tablet TAKE 1 TABLET BY MOUTH AT BEDTIME AS NEEDED FOR SLEEP. 30 tablet 0   No current facility-administered medications on file prior to visit.         ROS:  All others reviewed and negative.  Objective        PE:  BP 120/78 (BP Location: Right Arm, Patient Position: Sitting, Cuff Size: Normal)   Pulse (!) 59   Temp 98.6 F (37 C) (Oral)   Ht 6' 4 (1.93 m)   Wt 233 lb (105.7 kg)   SpO2 95%   BMI 28.36 kg/m                 Constitutional: Pt appears in NAD               HENT: Head: NCAT.                Right Ear: External ear normal.                 Left Ear: External ear normal.  Eyes: . Pupils are equal, round, and reactive to light. Conjunctivae and EOM are normal               Nose: without d/c or deformity               Neck: Neck supple. Gross normal ROM               Cardiovascular: Normal rate and regular rhythm.                 Pulmonary/Chest: Effort normal and breath sounds without rales or wheezing.                Abd:  Soft, NT, ND, + BS, no organomegaly               Neurological: Pt is alert. At baseline orientation, motor grossly intact               Skin: Skin is warm. No rashes, no other new lesions, LE edema - none               Left lateral epicondyle with mild tender swelling               Psychiatric: Pt behavior is normal without agitation   Micro: none  Cardiac tracings I have personally interpreted today:  none  Pertinent Radiological findings (summarize): none   Lab Results  Component Value Date   WBC 5.5 01/22/2024   HGB 15.4 01/22/2024   HCT 46.0 01/22/2024   PLT 210.0 01/22/2024   GLUCOSE 90 01/22/2024   CHOL 94 11/22/2023   TRIG 79.0 11/22/2023   HDL 40.60 11/22/2023   LDLCALC 38 11/22/2023   ALT 25 01/22/2024   AST 23 01/22/2024   NA 144 01/22/2024   K 4.2 01/22/2024   CL 108 01/22/2024   CREATININE 0.97 01/22/2024   BUN 15 01/22/2024   CO2 29 01/22/2024   TSH 2.07 11/22/2023   PSA 1.12 11/22/2023   INR 1.19 04/01/2013   HGBA1C 5.7 11/22/2023   Assessment/Plan:  Dustin Toren. is a 78 y.o. White or Caucasian [1] male with  has a past medical  history of Abdominal pain (04/01/2013), Acute bronchitis (03/07/2018), Allergic rhinitis (05/12/2008), Aortic atherosclerosis (08/23/2020), Arthritis, Ascending aortic aneurysm (12/13/2018), Atrial fibrillation with RVR (10/21/2012), Back pain, Benign prostatic hyperplasia (09/05/2012), Boil of buttock (06/24/2011), Bradycardia (04/17/2013), Cataract, Cellulitis of knee, left (04/12/2016), Chest pain with exertion presumed to be tachycardia related along with SOB (04/01/2013), Degeneration of lumbar intervertebral disc (12/10/2020), Eustachian tube dysfunction, bilateral (03/07/2018), External otitis of left ear (06/11/2020), Fatigue (07/04/2011), GERD (gastroesophageal reflux disease) (03/24/2009), Heart murmur, Hematuria, possible (04/01/2013), Hereditary and idiopathic peripheral neuropathy (05/12/2008), HLD (hyperlipidemia) (08/18/2007), HTN (hypertension) (12/04/2013), Insomnia (06/08/2016), Left knee pain (12/13/2018), Low back pain (12/01/2020), Lumbar spondylosis (05/05/2021), Mild cognitive impairment of uncertain or unknown etiology (12/09/2022), Mitral valve prolapse (07/04/2011), MRSA (methicillin resistant staph aureus) culture positive (5+ years ago), Neck pain (05/05/2021), OSA (obstructive sleep apnea) (07/04/2011), Pain in right hand (02/04/2020), Palpitations (03/06/2007), Personal history of colonic polyps (05/12/2008), Radicular pain in left arm (05/05/2021), Sacroiliitis (05/05/2021), Stricture and stenosis of esophagus (03/24/2009), TIA (transient ischemic attack) (11/22/2021), Tinnitus (09/05/2012), Tuberous sclerosis (06/08/2016), Vitamin B12 deficiency (06/15/2019), and Vitamin D  deficiency (05/23/2022).  Vitamin D  deficiency Last vitamin D  Lab Results  Component Value Date   VD25OH 37.25 11/22/2023   Low, to start oral replacement '  Vitamin B12 deficiency Lab Results  Component Value Date   VITAMINB12  1,179 (H) 11/22/2023   Stable, cont oral replacement - b12 1000 mcg  qd   HTN (hypertension) BP Readings from Last 3 Encounters:  05/22/24 120/78  01/22/24 118/64  12/26/23 127/80   Stable, pt to continue medical treatment lisinopril   5 every day, toprol  xl 12.5  qd   Left lateral epicondylitis Mild to mod, for refer sport med for possible cortosone, and tylenol  prn,  to f/u any worsening symptoms or concerns  Gait disorder Worsening, for PT evalaution to improved strength and balance  Followup: Return in about 6 months (around 11/21/2024).  Dustin Rush, MD 05/25/2024 9:28 PM [facility] - [facility] Medicine

## 2024-05-22 NOTE — Patient Instructions (Addendum)
 Please see Sports Medicine on the first floor for the left elbow and leg pain and weakness  You will be contacted regarding the referral for: Physical Therapy  Please continue all other medications as before, and refills have been done if requested.  Please have the pharmacy call with any other refills you may need.  Please keep your appointments with your specialists as you may have planned  Please make an Appointment to return in 6 months, or sooner if needed

## 2024-05-25 ENCOUNTER — Encounter: Payer: Self-pay | Admitting: Internal Medicine

## 2024-05-25 DIAGNOSIS — M7712 Lateral epicondylitis, left elbow: Secondary | ICD-10-CM | POA: Insufficient documentation

## 2024-05-25 DIAGNOSIS — R269 Unspecified abnormalities of gait and mobility: Secondary | ICD-10-CM | POA: Insufficient documentation

## 2024-05-25 NOTE — Assessment & Plan Note (Signed)
 Last vitamin D Lab Results  Component Value Date   VD25OH 37.25 11/22/2023   Low, to start oral replacement

## 2024-05-25 NOTE — Assessment & Plan Note (Signed)
 BP Readings from Last 3 Encounters:  05/22/24 120/78  01/22/24 118/64  12/26/23 127/80   Stable, pt to continue medical treatment lisinopril   5 every day, toprol  xl 12.5  qd

## 2024-05-25 NOTE — Assessment & Plan Note (Signed)
 Worsening, for PT evalaution to improved strength and balance

## 2024-05-25 NOTE — Assessment & Plan Note (Signed)
 Lab Results  Component Value Date   VITAMINB12 1,179 (H) 11/22/2023   Stable, cont oral replacement - b12 1000 mcg qd

## 2024-05-25 NOTE — Assessment & Plan Note (Signed)
 Mild to mod, for refer sport med for possible cortosone, and tylenol  prn,  to f/u any worsening symptoms or concerns

## 2024-06-03 ENCOUNTER — Other Ambulatory Visit: Payer: Self-pay

## 2024-06-03 ENCOUNTER — Ambulatory Visit: Attending: Internal Medicine | Admitting: Physical Therapy

## 2024-06-03 ENCOUNTER — Encounter: Payer: Self-pay | Admitting: Physical Therapy

## 2024-06-03 DIAGNOSIS — M6281 Muscle weakness (generalized): Secondary | ICD-10-CM | POA: Diagnosis present

## 2024-06-03 DIAGNOSIS — R269 Unspecified abnormalities of gait and mobility: Secondary | ICD-10-CM | POA: Insufficient documentation

## 2024-06-03 DIAGNOSIS — R2689 Other abnormalities of gait and mobility: Secondary | ICD-10-CM | POA: Insufficient documentation

## 2024-06-03 NOTE — Therapy (Signed)
 OUTPATIENT PHYSICAL THERAPY EVALUATION   Patient Name: Dustin Berry. MRN: [id number] DOB:[birth year], 78 y.o., male Today's Date: 06/03/2024  END OF SESSION:  PT End of Session - 06/03/24 1402     Visit Number 1    Number of Visits 13    Date for PT Re-Evaluation 07/15/24    Authorization Type MDC    Progress Note Due on Visit 10    PT Start Time 1402    PT Stop Time 1445    PT Time Calculation (min) 43 min    Activity Tolerance Patient tolerated treatment well          Past Medical History:  Diagnosis Date   Abdominal pain 04/01/2013   Acute bronchitis 03/07/2018   Allergic rhinitis 05/12/2008   Aortic atherosclerosis 08/23/2020   Arthritis    Ascending aortic aneurysm 12/13/2018   Atrial fibrillation with RVR 10/21/2012   Echo- EF 50-55%; mild concentric LVH; flow pattern suggestive of impaired LV relaxation; mild mitral valve prolapse, trace mitral regurgitation   Back pain    receiving PT   Benign prostatic hyperplasia 09/05/2012   Boil of buttock 06/24/2011   Bradycardia 04/17/2013   Cataract    Cellulitis of knee, left 04/12/2016   Chest pain with exertion presumed to be tachycardia related along with SOB 04/01/2013   Degeneration of lumbar intervertebral disc 12/10/2020   Eustachian tube dysfunction, bilateral 03/07/2018   External otitis of left ear 06/11/2020   Fatigue 07/04/2011   GERD (gastroesophageal reflux disease) 03/24/2009   Heart murmur    Hematuria, possible 04/01/2013   Hereditary and idiopathic peripheral neuropathy 05/12/2008   HLD (hyperlipidemia) 08/18/2007   HTN (hypertension) 12/04/2013   Insomnia 06/08/2016   Left knee pain 12/13/2018   Low back pain 12/01/2020   Lumbar spondylosis 05/05/2021   Mild cognitive impairment of uncertain or unknown etiology 12/09/2022   Mitral valve prolapse 07/04/2011   MRSA (methicillin resistant staph aureus) culture positive 5+ years ago   Neck pain 05/05/2021   OSA (obstructive sleep apnea)  07/04/2011   Using CPAP nightly   Pain in right hand 02/04/2020   Palpitations 03/06/2007   R/P MV - mild perfusion defect in basal inferoseptal, basal inferior, mid inferoseptal, and mid inferior regions, consistent w/ infarct/scar; no scintigraphic evidence of inducible myocardial ischemia; prior non transmural infarct cannot be completely excluded; EF 48%; no significant change from previous study   Personal history of colonic polyps 05/12/2008   Radicular pain in left arm 05/05/2021   Sacroiliitis 05/05/2021   Stricture and stenosis of esophagus 03/24/2009   TIA (transient ischemic attack) 11/22/2021   Tinnitus 09/05/2012   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   Vitamin B12 deficiency 06/15/2019   Vitamin D  deficiency 05/23/2022   Past Surgical History:  Procedure Laterality Date   ATRIAL FIBRILLATION ABLATION     CARDIOVERSION N/A 04/03/2013   Procedure: CARDIOVERSION;  Surgeon: Vinie KYM Maxcy, MD;  Location: [facility] ENDOSCOPY;  Service: Cardiovascular;  Laterality: N/A;   COLONOSCOPY  2018   HAND SURGERY     Thumb joint repair   POLYPECTOMY     TEE WITHOUT CARDIOVERSION N/A 04/03/2013   Procedure: TRANSESOPHAGEAL ECHOCARDIOGRAM (TEE);  Surgeon: Vinie KYM Maxcy, MD;  Location: [facility] ENDOSCOPY;  Service: Cardiovascular;  Laterality: N/A;   TONSILLECTOMY AND ADENOIDECTOMY     UPPER GASTROINTESTINAL ENDOSCOPY     dilation   Patient Active Problem List   Diagnosis Date Noted   Left lateral epicondylitis 05/25/2024   Gait disorder 05/25/2024  Arthritis of left wrist 03/04/2024   Encounter for orthopedic follow-up care 03/04/2024   Right upper quadrant abdominal pain 01/27/2024   Alzheimer dementia (HCC) 12/26/2023   Mild cognitive impairment of uncertain or unknown etiology 12/09/2022   Vitamin D  deficiency 05/23/2022   TIA (transient ischemic attack) 11/22/2021   Aortic root aneurysm 07/30/2021   Lumbar spondylosis 05/05/2021   Neck pain 05/05/2021   Radicular pain in left arm  05/05/2021   Sacroiliitis 05/05/2021   Degeneration of lumbar intervertebral disc 12/10/2020   Low back pain 12/01/2020   Pain of right hip joint 10/22/2020   Aortic atherosclerosis 08/23/2020   External otitis of left ear 06/11/2020   Pain in right hand 02/04/2020   Vitamin B12 deficiency 06/15/2019   Weight loss 12/13/2018   Left knee pain 12/13/2018   Ascending aortic aneurysm 12/13/2018   Eustachian tube dysfunction, bilateral 03/07/2018   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   Insomnia 06/08/2016   Cellulitis of knee, left 04/12/2016   HTN (hypertension) 12/04/2013   Bradycardia 04/17/2013   Atrial fibrillation with RVR 04/01/2013   Chest pain with exertion presumed to be tachycardia related along with SOB 04/01/2013   Hematuria 04/01/2013   Benign prostatic hyperplasia 09/05/2012   Tinnitus 09/05/2012   OSA (obstructive sleep apnea) 07/04/2011   Mitral valve prolapse 07/04/2011   Fatigue 07/04/2011   Stricture and stenosis of esophagus 03/24/2009   GERD (gastroesophageal reflux disease) 03/24/2009   Hereditary and idiopathic peripheral neuropathy 05/12/2008   Allergic rhinitis 05/12/2008   History of colonic polyps 05/12/2008   HLD (hyperlipidemia) 08/18/2007    PCP: Norleen Lynwood ORN, MD  REFERRING PROVIDER: Norleen Lynwood ORN, MD  REFERRING DIAG: R26.9 (ICD-10-CM) - Gait disorder  Rationale for Evaluation and Treatment: Rehabilitation  THERAPY DIAG:  Muscle weakness (generalized)  Other abnormalities of gait and mobility  ONSET DATE: gradually worsening over past several years  SUBJECTIVE:                                                                                                                                                                                           SUBJECTIVE STATEMENT: Pt endorses history of peripheral neuropathy with gradually worsening weakness which is now affecting his walking. Feels his calves are weaker, can no longer do heel raise. Feels his  light touch is muted pretty significantly in both feet.  Used to be quite active, enjoyed playing basketball into his 18s. Enjoys going to gym but has been less active over last couple years.  Difficulty navigating hills, prolonged walking.   PERTINENT HISTORY:  ascending aortic aneurysm, Afib RVR, hx chest pain, GERD, peripheral neuropathy, insomnia,  CI, MRSA, OSA, TIA Describes Afib as stable, no pacemaker   PAIN:  Reports hx of chronic back pain - standing, walking, bending; states it is actually improving some w/ chiropractic care   PRECAUTIONS: fall risk, cardiac hx  RED FLAGS: None   WEIGHT BEARING RESTRICTIONS: No  FALLS:  Has patient fallen in last 6 months? No  LIVING ENVIRONMENT: 2 story home, bed/bath upstairs and main floor. 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   PATIENT SURVEYS:  PSFS:  - walking, 5  - heel raise, 0  - sit<>stand, 4  Total: 9; avg 3  COGNITION: Overall cognitive status: Within functional limits for tasks assessed     SENSATION: Light touch intact BIL LE aside from plantar aspect of BIL feet R>L per pt report   LOWER EXTREMITY MMT:    MMT Right eval Left eval  Hip flexion 4 4  Hip abduction (modified sitting) 5 5  Hip internal rotation    Hip external rotation    Knee flexion 4- 4  Knee extension 5 5  Ankle dorsiflexion 5 5   (Blank rows = not tested) (Key: WFL = within functional limits not formally assessed, * = concordant pain, s = stiffness/stretching sensation, NT = not tested)  Comments:     FUNCTIONAL  TESTS:  5xSTS: 24.67sec w/o UE support  GAIT:  FGA (no AD):  -Item 1 Gait Level Surface: mild impairment 2  -Item 2 Change in Gait Speed: moderate impairment 1   -Item 3 Gait with Horizontal Head Turns: mild impairment 2  -Item 4 Gait with Vertical Head Turns: Normal 3  -Item 5 Gait with Pivot Turn: moderate impairment 1   -Item 6 Step Over Obstacle: mild impairment 2  -Item 7 Gait with Narrow Base of Support: severe impairment 0 -Item 8 Gait with Eyes Closed: mild impairment 2             -Item 9 Ambulating Backwards: moderate impairment 1  -Item 10 Steps: Normal 3 (per pt report but does require inc time/effort) Total: 17 /30  * Score of <=22/30 indicates that patient is at increased risk for falls.   TREATMENT DATE:  [facility] Adult PT Treatment:                                                DATE: 06/03/24 Therapeutic Exercise: HEP practice + education/handout, emphasis on pacing and safe setup, rationale for interventions based on exam findings and relevant anatomy/physiology     PATIENT EDUCATION:  Education details: Pt education on PT impairments, prognosis, and POC. Informed consent. Rationale for interventions, safe/appropriate HEP performance Person educated: Patient Education method: Explanation, Demonstration, Tactile cues, Verbal cues Education comprehension: verbalized understanding, returned demonstration, verbal cues required, tactile cues required, and needs further education    HOME EXERCISE PROGRAM: Access Code: [email or web address] URL: [email or web address] Date: 06/03/2024 Prepared by: Alm Jenny  Exercises - Seated Heel Raise  - 2-3 x daily -  1 sets - 8-10 reps - Sit to Stand with Armchair  - 2-3 x daily - 1 sets - 5 reps - Standing Tandem Balance with Counter Support  - 2-3 x daily - 1 sets - 1-2 reps - 20-30sec hold  ASSESSMENT:  CLINICAL IMPRESSION: Patient is a pleasant 78 y.o. gentleman who was seen today for physical therapy evaluation and  treatment for gait disorder. He endorses gradually worsening balance/weakness over last several years which he attributes to his peripheral neuropathy. On exam he demonstrates mild reductions in focal MMT, reduced functional strength and fall risk as evidenced by 5xSTS, and fall risk as evidenced by FGA. Demonstrates impairments in change of pace, dual tasking, and BOS challenges. No adverse events, tolerates exam and HEP well overall. Recommend trial of skilled PT to address aforementioned deficits with aim of improving functional tolerance and reducing pain with typical activities. Pt departs today's session in no acute distress, all voiced concerns/questions addressed appropriately from PT perspective.      OBJECTIVE IMPAIRMENTS: Abnormal gait, decreased activity tolerance, decreased balance, decreased endurance, decreased mobility, difficulty walking, decreased strength, impaired perceived functional ability, and improper body mechanics.   ACTIVITY LIMITATIONS: carrying, lifting, bending, standing, stairs, transfers, and locomotion level  PARTICIPATION LIMITATIONS: meal prep, cleaning, laundry, community activity, and yard work  PERSONAL FACTORS: Age, Time since onset of injury/illness/exacerbation, and 3+ comorbidities: ascending aortic aneurysm, Afib RVR, hx chest pain, GERD, peripheral neuropathy, insomnia, CI, MRSA, OSA, TIA are also affecting patient's functional outcome.   REHAB POTENTIAL: Good  CLINICAL DECISION MAKING: Evolving/moderate complexity  EVALUATION COMPLEXITY: Moderate   GOALS:   SHORT TERM GOALS: Target date: 06/24/2024   Pt will demonstrate appropriate understanding and performance of initially prescribed HEP in order to facilitate improved independence with management of symptoms.  Baseline: HEP established  Goal status: INITIAL   2. Pt will report at least 5 sec improvement in 5xSTS in order to reduce fall risk. (MCID 2.3sec)  Baseline: 24 sec no UE  support  Goal status: INITIAL    LONG TERM GOALS: Target date: 07/15/2024   Pt will score avg 6 or greater on PSFS in order to demonstrate improved perception of functional status due to symptoms.  Baseline: 3 avg Goal status: INITIAL  2.  Pt will demonstrate LE MMT of at least 4+/5 in tested groups in order to facilitate improved functional strength.  Baseline: see MMT chart above  Goal status: INITIAL   3.  Pt will score greater than or equal to 22/30 on Functional Gait assessment in order to indicate reduced fall risk (cutoff score </= 22/30 predictive of falls per Willye et al 2010, MCID 4 pts Beninato et al 2014)  Baseline: 17/30  Goal status: INITIAL   4. Pt will perform 5xSTS in </=14 sec in order to demonstrate reduced fall risk and improved functional independence. (MCID of 2.3sec)  Baseline: 24 sec no UE support  Goal status: INITIAL   5. Pt will demonstrate appropriate performance of final prescribed HEP in order to facilitate improved self-management of symptoms post-discharge.   Baseline: initial HEP prescribed  Goal status: INITIAL     PLAN:  PT FREQUENCY: 1-2x/week  PT DURATION: 6 weeks  PLANNED INTERVENTIONS: [id number]- PT Re-evaluation, 97750- Physical Performance Testing, 97110-Therapeutic exercises, 97530- Therapeutic activity, 97112- Neuromuscular re-education, 97535- Self Care, [id number]- Manual therapy, [id number]- Gait training, Patient/Family education, Balance training, Stair training, Taping, Joint mobilization, Spinal mobilization, Cryotherapy, and Moist heat.  PLAN FOR NEXT SESSION: Review/update HEP PRN. Work on  ROM/strength exercises as appropriate with emphasis on dual tasking, change of pace, functional strengthening. Symptom modification strategies as indicated/appropriate, mindful of cardiac history.    Alm DELENA Jenny PT, DPT 06/03/2024 5:16 PM

## 2024-06-05 ENCOUNTER — Ambulatory Visit (INDEPENDENT_AMBULATORY_CARE_PROVIDER_SITE_OTHER): Payer: Medicare HMO | Admitting: Neurology

## 2024-06-05 ENCOUNTER — Encounter: Payer: Self-pay | Admitting: Neurology

## 2024-06-05 VITALS — BP 112/67 | HR 56 | Resp 16 | Ht 77.0 in

## 2024-06-05 DIAGNOSIS — G3184 Mild cognitive impairment, so stated: Secondary | ICD-10-CM | POA: Diagnosis not present

## 2024-06-05 DIAGNOSIS — G309 Alzheimer's disease, unspecified: Secondary | ICD-10-CM

## 2024-06-05 NOTE — Progress Notes (Signed)
 [facility]  PATIENT: Dustin Berry. DOB: Nov 28, 1946  REQUESTING CLINICIAN: Norleen Lynwood ORN, MD HISTORY FROM: Patient and Spouse  REASON FOR VISIT: Memory problem   HISTORICAL  CHIEF COMPLAINT:  Chief Complaint  Patient presents with   Memory Loss    RM13, alone, MEMORY LOSS:FAQ score of 2, moca score of 24. Pt stated that he doesn't notice the infusion helping but he doesn't feel like he's any worse    INTERVAL HISTORY 06/05/2024:  Dustin Berry presents today for follow-up, last visit was in January, since that he has been doing well.  He continues Leqembi  infusion, his first infusion was in June 2024 and he has been getting the infusion every 2 weeks.  Tells me that he has been doing well, feels like his memory is stable.  He has cut down on the store hours, only works 2 days a week versus 50 hours a week, this is due to wife retiring and now spending more time in the shop.  Overall he does not have any additional questions or concern, the infusions are going well and he wonders what the next step after the completion of the treatment in 47-month.   Today December 21, 2023 SS: Has been on Leqembi  since 06/01/23 with infusions every 2 weeks, most recent was 12/20/23. Will need new authorization before any more infusions.  Has not had any issues, has cardiac palpations often, on metoprolol . Today MOCA 25/30. He feels memory is sharper, remembering appointments more frequently with times. Still has issues with word recall. Remains on Exelon  3 mg BID. No health changes. Drives a car, does all cooking, grocery shopping. 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 Mentions weight gain, looks similar from Feb 2024. Wife is taking over the bills, likely due to his memory also she has accounting background. MOCA 25/30, functional activities questionnaire 0. Mentions over a year, weakness to right calf known history of peripheral neuropathy.   -MRI of the brain  07/20/2023 stable minimal chronic microvascular ischemic change, mild generalized cortical atrophy.  No ARIA. -MRI of the brain 08/22/2023 was stable, no ARIA. -MRI of the brain 11/25/2023 stable, no ARIA  INTERVAL HISTORY 05/03/2023:  Patient presents today for follow-up, last visit was in February.  Since then he reports that he has been stable.  He did follow-up with the [facility] [place]  but was not included in the clinical trial.  He is still interested in starting Leqembi .  No other complaints or concerns.  INTERVAL HISTORY 01/31/2023:  Patient presents today for follow-up, he is accompanied by wife.  He did have some questions regarding a clinical trial in [place]  (Safety and Feasibility of Exablate Blood-Brain Barrier Disruption for Mild Cognitive Impairment or Mild Alzheimer's Disease Undergoing Standard of Care Monoclonal Antibody (mAb) Therapy).   They are requesting him to have a amyloid PET scan or a CSF amyloid test.  He is doing well with the Exelon  so far.  No new symptoms.  INTERVAL HISTORY 01/16/2023:  Patient presents today for follow-up, last visit was in July.  At that time, he followed up with Dr. Richie neuropsych, had a full test completed and he was diagnosed with mild cognitive impairment.  He reports that his symptoms are still the same.  They are not getting worse.  Currently he is on Exelon  3 mg twice daily.  For his neuropathic pain, he did try pregabalin  but did not see any relief therefore he discontinued.  No other complaints.  INTERVAL HISTORY 06/15/2022:  Patient presents today for follow-up, he presents alone.  At last visit due to his bradycardia we defer starting Aricept or any medications until completion of the neuropsych testing.  He reported after going home and reading about the medication, he would like to start it now.  I did inform him that his bradycardia is a relative contraindication to Aricept but we can start rivastigmine . He is comfortable  with plan. His neuropsych testing is scheduled for January.  He also mentioned that since starting the pregabalin , now when walking he will have cramp-like pain in bilateral calf and his nighttime pain is not well controlled.    INTERVAL HISTORY 03/07/2022:  Patient presents today for follow-up, at last visit plan was to start pregabalin  for his neuropathic pain.  He reported the pain improved.  Today he is concerned for his memory.  He reported he has issue with recall, his long-term memory and short-term memory are intact.  Wife thinks he does not do all the tasks he supposed to do, he does forget a lot.  There was time at home where he forget and left the stove on, burning some pots.  He still drives, does not have any recent accident or being lost in family or places.  He reported mother had a history of Alzheimer's disease, diagnosed in the 94s.  He still independent, able to for perform all ADLs and a IADLs.  Denies any word finding difficulty denies forgetting names of family members.  Wife took over the finances a few years ago because he did forget to pay bills on time.   HISTORY OF PRESENT ILLNESS:  This is a 78 year old gentleman with past medical history of hypertension hyperlipidemia, previously atrial fibrillation status post ablation who was referred by PMD for TIA work up.  Patient reports 6 weeks ago while at a computer desk he noted  binocular double vision, on closing 1 eye his vision will be back to normal but with both eyes he will have double vision.  The entire episode lasted about 3 minutes then resolved.  There were no associated symptoms with the blurry vision, denies any headaches, denies any numbness, no weakness and no slurred speech.  He never experienced an episode like that in the past and has not had any additional episodes.  He did follow-up with his primary care doctor who obtained a stroke labs and refer him to neurology for TIA work-up.  His lipid panel was within normal  limits and a hemoglobin A1c was 5.7.  He denies any previous history of strokes.     OTHER MEDICAL CONDITIONS: HLD, HTN, CAD   REVIEW OF SYSTEMS: Full 14 system review of systems performed and negative with exception of: as noted in the HPI   ALLERGIES: Allergies  Allergen Reactions   Penicillin G Rash   Quinolones Other (See Comments)    Other reaction(s): Other (See Comments) Fluroquinolone antibiotics should be avoided in patients with history of aortic aneurysm/dissection   Amoxil [Amoxicillin] Rash    HOME MEDICATIONS: Outpatient Medications Prior to Visit  Medication Sig Dispense Refill   aspirin  81 MG EC tablet Take 1 tablet (81 mg total) by mouth daily. Swallow whole. 30 tablet 12   atorvastatin  (LIPITOR) 80 MG tablet Take 1 tablet (80 mg total) by mouth daily. 90 tablet 3   cyanocobalamin  (VITAMIN B12) 100 MCG tablet Take 100 mcg by mouth daily.     Lecanemab -irmb (LEQEMBI ) 200 MG/2ML SOLN Inject 10 mg/kg into the  vein every 14 (fourteen) days.     lisinopril  (ZESTRIL ) 5 MG tablet Take 1 tablet (5 mg total) by mouth daily. 90 tablet 3   meloxicam (MOBIC) 15 MG tablet Take 15 mg by mouth as needed for pain. TAKE 0.5-1 tablet  AS NEEDED     metoprolol  succinate (TOPROL -XL) 25 MG 24 hr tablet Take 2 tablets (50 mg total) by mouth every morning AND 0.5 tablets (12.5 mg total) every evening. 225 tablet 3   rivastigmine  (EXELON ) 3 MG capsule Take 1 capsule (3 mg total) by mouth 2 (two) times daily. 180 capsule 3   tamsulosin  (FLOMAX ) 0.4 MG CAPS capsule Take 0.4 mg by mouth daily.     VITAMIN D  PO Take 1 tablet by mouth daily in the afternoon.     zolpidem  (AMBIEN ) 5 MG tablet TAKE 1 TABLET BY MOUTH AT BEDTIME AS NEEDED FOR SLEEP. 30 tablet 0   silodosin  (RAPAFLO ) 8 MG CAPS capsule Take 1 capsule (8 mg total) by mouth daily with breakfast. 90 capsule 3   No facility-administered medications prior to visit.    PAST MEDICAL HISTORY: Past Medical History:  Diagnosis Date    Abdominal pain 04/01/2013   Acute bronchitis 03/07/2018   Allergic rhinitis 05/12/2008   Aortic atherosclerosis 08/23/2020   Arthritis    Ascending aortic aneurysm 12/13/2018   Atrial fibrillation with RVR 10/21/2012   Echo- EF 50-55%; mild concentric LVH; flow pattern suggestive of impaired LV relaxation; mild mitral valve prolapse, trace mitral regurgitation   Back pain    receiving PT   Benign prostatic hyperplasia 09/05/2012   Boil of buttock 06/24/2011   Bradycardia 04/17/2013   Cataract    Cellulitis of knee, left 04/12/2016   Chest pain with exertion presumed to be tachycardia related along with SOB 04/01/2013   Degeneration of lumbar intervertebral disc 12/10/2020   Eustachian tube dysfunction, bilateral 03/07/2018   External otitis of left ear 06/11/2020   Fatigue 07/04/2011   GERD (gastroesophageal reflux disease) 03/24/2009   Heart murmur    Hematuria, possible 04/01/2013   Hereditary and idiopathic peripheral neuropathy 05/12/2008   HLD (hyperlipidemia) 08/18/2007   HTN (hypertension) 12/04/2013   Insomnia 06/08/2016   Left knee pain 12/13/2018   Low back pain 12/01/2020   Lumbar spondylosis 05/05/2021   Mild cognitive impairment of uncertain or unknown etiology 12/09/2022   Mitral valve prolapse 07/04/2011   MRSA (methicillin resistant staph aureus) culture positive 5+ years ago   Neck pain 05/05/2021   OSA (obstructive sleep apnea) 07/04/2011   Using CPAP nightly   Pain in right hand 02/04/2020   Palpitations 03/06/2007   R/P MV - mild perfusion defect in basal inferoseptal, basal inferior, mid inferoseptal, and mid inferior regions, consistent w/ infarct/scar; no scintigraphic evidence of inducible myocardial ischemia; prior non transmural infarct cannot be completely excluded; EF 48%; no significant change from previous study   Personal history of colonic polyps 05/12/2008   Radicular pain in left arm 05/05/2021   Sacroiliitis 05/05/2021   Stricture and  stenosis of esophagus 03/24/2009   TIA (transient ischemic attack) 11/22/2021   Tinnitus 09/05/2012   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   Vitamin B12 deficiency 06/15/2019   Vitamin D  deficiency 05/23/2022    PAST SURGICAL HISTORY: Past Surgical History:  Procedure Laterality Date   ATRIAL FIBRILLATION ABLATION     CARDIOVERSION N/A 04/03/2013   Procedure: CARDIOVERSION;  Surgeon: Vinie KYM Maxcy, MD;  Location: [facility] ENDOSCOPY;  Service: Cardiovascular;  Laterality: N/A;   COLONOSCOPY  2018   HAND SURGERY     Thumb joint repair   POLYPECTOMY     TEE WITHOUT CARDIOVERSION N/A 04/03/2013   Procedure: TRANSESOPHAGEAL ECHOCARDIOGRAM (TEE);  Surgeon: Vinie KYM Maxcy, MD;  Location: [facility] ENDOSCOPY;  Service: Cardiovascular;  Laterality: N/A;   TONSILLECTOMY AND ADENOIDECTOMY     UPPER GASTROINTESTINAL ENDOSCOPY     dilation    FAMILY HISTORY: Family History  Problem Relation Age of Onset   Dementia Mother        late 9s   Alzheimer's disease Mother    Heart disease Father 72       died with MI   Melanoma Sister    Breast cancer Maternal Grandmother    Stroke Maternal Grandfather    Colon cancer Neg Hx    Esophageal cancer Neg Hx    Stomach cancer Neg Hx    Rectal cancer Neg Hx    Colon polyps Neg Hx     SOCIAL HISTORY: 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  Tobacco Use   Smoking status: Never   Smokeless tobacco: Never  Vaping Use   Vaping status: Never Used  Substance and Sexual Activity   Alcohol use: Not Currently    Alcohol/week: 0.0 - 1.0 standard drinks of alcohol    Comment: infrequent glass of wine   Drug use: No   Sexual activity: Not on file  Other Topics  Concern   Not on file  Social History Narrative   Not on file   Social Drivers of Health   Financial Resource Strain: Low Risk  (09/21/2023)   Overall Financial Resource Strain (CARDIA)    Difficulty of Paying Living Expenses: Not hard at all  Food Insecurity: No Food Insecurity (09/21/2023)   Hunger Vital Sign    Worried About Running Out of Food in the Last Year: Never true    Ran Out of Food in the Last Year: Never true  Transportation Needs: No Transportation Needs (09/21/2023)   PRAPARE - Administrator, Civil Service (Medical): No    Lack of Transportation (Non-Medical): No  Physical Activity: Inactive (09/21/2023)   Exercise Vital Sign    Days of Exercise per Week: 0 days    Minutes of Exercise per Session: 0 min  Stress: No Stress Concern Present (09/21/2023)   [employer] of Occupational Health - Occupational Stress Questionnaire    Feeling of Stress : Not at all  Social Connections: Unknown (09/21/2023)   Social Connection and Isolation Panel    Frequency of Communication with Friends and Family: More than three times a week    Frequency of Social Gatherings with Friends and Family: More than three times a week    Attends Religious Services: Not on file    Active Member of Clubs or Organizations: Yes    Attends Banker Meetings: More than 4 times per year    Marital Status: Married  Catering manager Violence: Not At Risk (09/21/2023)   Humiliation, Afraid, Rape, and Kick questionnaire    Fear of Current or Ex-Partner: No    Emotionally Abused: No    Physically Abused: No    Sexually  Abused: No     PHYSICAL EXAM  GENERAL EXAM/CONSTITUTIONAL: Vitals:  Vitals:   06/05/24 1341  BP: 112/67  Pulse: (!) 56  Resp: 16  SpO2: 95%  Height: 6' 5 (1.956 m)      Body mass index is 27.63 kg/m. Wt Readings from Last 3 Encounters:  05/22/24 233 lb (105.7 kg)  01/22/24 239 lb (108.4 kg)  12/14/23 237 lb (107.5 kg)   Patient is in  no distress; well developed, nourished and groomed; neck is supple  MUSCULOSKELETAL: Gait, strength, tone, movements noted in Neurologic exam below  NEUROLOGIC: MENTAL STATUS:     09/21/2023    2:00 PM  MMSE - Mini Mental State Exam  Not completed: Unable to complete   awake, alert, oriented to person, place and time recent and remote memory intact normal attention and concentration language fluent, comprehension intact, naming intact fund of knowledge appropriate     06/05/2024    1:35 PM 12/26/2023    8:45 AM 05/03/2023    3:28 PM 03/07/2022   10:21 AM  Montreal Cognitive Assessment   Visuospatial/ Executive (0/5) 5 5 5 4   Naming (0/3) 3 3 3 2   Attention: Read list of digits (0/2) 2 2 2 2   Attention: Read list of letters (0/1) 1 1 1 1   Attention: Serial 7 subtraction starting at 100 (0/3) 3 3 3 2   Language: Repeat phrase (0/2) 1 1 2 1   Language : Fluency (0/1) 1 1 1 1   Abstraction (0/2) 2 2 2 2   Delayed Recall (0/5) 0 1 3 0  Orientation (0/6) 6 6 6 6   Total 24 25 28 21   Adjusted Score (based on education)    21    CRANIAL NERVE:  2nd, 3rd, 4th, 6th - visual fields full to confrontation, extraocular muscles intact, no nystagmus 5th - facial sensation symmetric 7th - facial strength symmetric 8th - hearing intact 11th - shoulder shrug symmetric  MOTOR:  normal bulk and tone, full strength in the BUE, BLE. No atrophy noted to right calf.   SENSORY:  normal and symmetric to light touch  COORDINATION:  finger-nose-finger, fine finger movements normal  GAIT/STATION:  Normal, slight limp with the right  DIAGNOSTIC DATA (LABS, IMAGING, TESTING) - I reviewed patient records, labs, notes, testing and imaging myself where available.  Lab Results  Component Value Date   WBC 5.5 01/22/2024   HGB 15.4 01/22/2024   HCT 46.0 01/22/2024   MCV 91.0 01/22/2024   PLT 210.0 01/22/2024      Component Value Date/Time   NA 144 01/22/2024 1124   NA 140 09/18/2020 1038   K  4.2 01/22/2024 1124   CL 108 01/22/2024 1124   CO2 29 01/22/2024 1124   GLUCOSE 90 01/22/2024 1124   BUN 15 01/22/2024 1124   BUN 12 09/18/2020 1038   CREATININE 0.97 01/22/2024 1124   CREATININE 1.13 03/01/2017 0001   CALCIUM  9.5 01/22/2024 1124   PROT 6.8 01/22/2024 1124   PROT 6.7 09/18/2020 1038   ALBUMIN 4.2 01/22/2024 1124   ALBUMIN 4.3 09/18/2020 1038   AST 23 01/22/2024 1124   ALT 25 01/22/2024 1124   ALKPHOS 74 01/22/2024 1124   BILITOT 0.7 01/22/2024 1124   BILITOT 0.6 09/18/2020 1038   GFRNONAA 65 09/18/2020 1038   GFRAA 75 09/18/2020 1038   Lab Results  Component Value Date   CHOL 94 11/22/2023   HDL 40.60 11/22/2023   LDLCALC 38 11/22/2023   TRIG 79.0 11/22/2023  CHOLHDL 2 11/22/2023   Lab Results  Component Value Date   HGBA1C 5.7 11/22/2023   Lab Results  Component Value Date   VITAMINB12 1,179 (H) 11/22/2023   Lab Results  Component Value Date   TSH 2.07 11/22/2023    MRI Brain wo contrast 12/29/2021 1. No acute intracranial pathology.  No evidence of recent infarct. 2. Mild parenchymal volume loss and chronic white matter microangiopathy  PET Amyloid 04/06/2023 Scan is POSITIVE for brain amyloid and is most consistent with the presence of moderate to frequent neuritic beta-amyloid plaques in the brain.  ASSESSMENT AND PLAN  78 y.o. year old male with atrial fibrillation status post ablation, hypertension, hyperlipidemia, mild cognitive impairment due to AD who is presenting for follow up.  His ATN profile was postive for AD biomarker's.  His amyloid PET scan was also positive.  On Leqembi  since 06/01/23.  Has been receiving infusions every 2 weeks. Serial MRI imaging has been negative for ARIA.  Today, MoCA 24/30.  He has tolerated Leqembi  well.  -Overall, seems to have remained fairly stable. Remains independent, drives car, working part time.  -Will continue Leqembi  every 2 weeks x 18 months total -Remains on Exelon  3 mg twice daily -Encourage  exercise, brain stimulating activity, healthy eating, management of vascular risk factors -We discussed weakness to right calf, he will start physical therapy  -Follow-up in 6 months   Pastor Falling, MD  [facility] [address], [address] [place], [place] [address] [phone]

## 2024-06-10 ENCOUNTER — Ambulatory Visit: Attending: Internal Medicine | Admitting: Physical Therapy

## 2024-06-10 ENCOUNTER — Encounter: Payer: Self-pay | Admitting: Physical Therapy

## 2024-06-10 DIAGNOSIS — M6281 Muscle weakness (generalized): Secondary | ICD-10-CM | POA: Diagnosis present

## 2024-06-10 DIAGNOSIS — R2689 Other abnormalities of gait and mobility: Secondary | ICD-10-CM | POA: Insufficient documentation

## 2024-06-10 NOTE — Therapy (Signed)
 OUTPATIENT PHYSICAL THERAPY TREATMENT   Patient Name: Dustin Berry. MRN: [id number] DOB:1946-04-21, 78 y.o., male Today's Date: 06/10/2024  END OF SESSION:  PT End of Session - 06/10/24 1216     Visit Number 2    Number of Visits 13    Date for PT Re-Evaluation 07/15/24    Authorization Type MDC    Progress Note Due on Visit 10    PT Start Time 1216    PT Stop Time 1256    PT Time Calculation (min) 40 min    Activity Tolerance Patient tolerated treatment well           Past Medical History:  Diagnosis Date   Abdominal pain 04/01/2013   Acute bronchitis 03/07/2018   Allergic rhinitis 05/12/2008   Aortic atherosclerosis 08/23/2020   Arthritis    Ascending aortic aneurysm 12/13/2018   Atrial fibrillation with RVR 10/21/2012   Echo- EF 50-55%; mild concentric LVH; flow pattern suggestive of impaired LV relaxation; mild mitral valve prolapse, trace mitral regurgitation   Back pain    receiving PT   Benign prostatic hyperplasia 09/05/2012   Boil of buttock 06/24/2011   Bradycardia 04/17/2013   Cataract    Cellulitis of knee, left 04/12/2016   Chest pain with exertion presumed to be tachycardia related along with SOB 04/01/2013   Degeneration of lumbar intervertebral disc 12/10/2020   Eustachian tube dysfunction, bilateral 03/07/2018   External otitis of left ear 06/11/2020   Fatigue 07/04/2011   GERD (gastroesophageal reflux disease) 03/24/2009   Heart murmur    Hematuria, possible 04/01/2013   Hereditary and idiopathic peripheral neuropathy 05/12/2008   HLD (hyperlipidemia) 08/18/2007   HTN (hypertension) 12/04/2013   Insomnia 06/08/2016   Left knee pain 12/13/2018   Low back pain 12/01/2020   Lumbar spondylosis 05/05/2021   Mild cognitive impairment of uncertain or unknown etiology 12/09/2022   Mitral valve prolapse 07/04/2011   MRSA (methicillin resistant staph aureus) culture positive 5+ years ago   Neck pain 05/05/2021   OSA (obstructive sleep apnea)  07/04/2011   Using CPAP nightly   Pain in right hand 02/04/2020   Palpitations 03/06/2007   R/P MV - mild perfusion defect in basal inferoseptal, basal inferior, mid inferoseptal, and mid inferior regions, consistent w/ infarct/scar; no scintigraphic evidence of inducible myocardial ischemia; prior non transmural infarct cannot be completely excluded; EF 48%; no significant change from previous study   Personal history of colonic polyps 05/12/2008   Radicular pain in left arm 05/05/2021   Sacroiliitis 05/05/2021   Stricture and stenosis of esophagus 03/24/2009   TIA (transient ischemic attack) 11/22/2021   Tinnitus 09/05/2012   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   Vitamin B12 deficiency 06/15/2019   Vitamin D  deficiency 05/23/2022   Past Surgical History:  Procedure Laterality Date   ATRIAL FIBRILLATION ABLATION     CARDIOVERSION N/A 04/03/2013   Procedure: CARDIOVERSION;  Surgeon: Vinie KYM Maxcy, MD;  Location: [facility] ENDOSCOPY;  Service: Cardiovascular;  Laterality: N/A;   COLONOSCOPY  2018   HAND SURGERY     Thumb joint repair   POLYPECTOMY     TEE WITHOUT CARDIOVERSION N/A 04/03/2013   Procedure: TRANSESOPHAGEAL ECHOCARDIOGRAM (TEE);  Surgeon: Vinie KYM Maxcy, MD;  Location: [facility] ENDOSCOPY;  Service: Cardiovascular;  Laterality: N/A;   TONSILLECTOMY AND ADENOIDECTOMY     UPPER GASTROINTESTINAL ENDOSCOPY     dilation   Patient Active Problem List   Diagnosis Date Noted   Left lateral epicondylitis 05/25/2024   Gait disorder 05/25/2024  Arthritis of left wrist 03/04/2024   Encounter for orthopedic follow-up care 03/04/2024   Right upper quadrant abdominal pain 01/27/2024   Alzheimer dementia (HCC) 12/26/2023   Mild cognitive impairment of uncertain or unknown etiology 12/09/2022   Vitamin D  deficiency 05/23/2022   TIA (transient ischemic attack) 11/22/2021   Aortic root aneurysm 07/30/2021   Lumbar spondylosis 05/05/2021   Neck pain 05/05/2021   Radicular pain in left arm  05/05/2021   Sacroiliitis 05/05/2021   Degeneration of lumbar intervertebral disc 12/10/2020   Low back pain 12/01/2020   Pain of right hip joint 10/22/2020   Aortic atherosclerosis 08/23/2020   External otitis of left ear 06/11/2020   Pain in right hand 02/04/2020   Vitamin B12 deficiency 06/15/2019   Weight loss 12/13/2018   Left knee pain 12/13/2018   Ascending aortic aneurysm 12/13/2018   Eustachian tube dysfunction, bilateral 03/07/2018   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   Insomnia 06/08/2016   Cellulitis of knee, left 04/12/2016   HTN (hypertension) 12/04/2013   Bradycardia 04/17/2013   Atrial fibrillation with RVR 04/01/2013   Chest pain with exertion presumed to be tachycardia related along with SOB 04/01/2013   Hematuria 04/01/2013   Benign prostatic hyperplasia 09/05/2012   Tinnitus 09/05/2012   OSA (obstructive sleep apnea) 07/04/2011   Mitral valve prolapse 07/04/2011   Fatigue 07/04/2011   Stricture and stenosis of esophagus 03/24/2009   GERD (gastroesophageal reflux disease) 03/24/2009   Hereditary and idiopathic peripheral neuropathy 05/12/2008   Allergic rhinitis 05/12/2008   History of colonic polyps 05/12/2008   HLD (hyperlipidemia) 08/18/2007    PCP: Norleen Lynwood ORN, MD  REFERRING PROVIDER: Norleen Lynwood ORN, MD  REFERRING DIAG: R26.9 (ICD-10-CM) - Gait disorder  Rationale for Evaluation and Treatment: Rehabilitation  THERAPY DIAG:  Muscle weakness (generalized)  Other abnormalities of gait and mobility  ONSET DATE: gradually worsening over past several years  SUBJECTIVE:                                                                                                                                                                                          Per eval: Pt endorses history of peripheral neuropathy with gradually worsening weakness which is now affecting his walking. Feels his calves are weaker, can no longer do heel raise. Feels his light touch is  muted pretty significantly in both feet.  Used to be quite active, enjoyed playing basketball into his 80s. Enjoys going to gym but has been less active over last couple years.  Difficulty navigating hills, prolonged walking.   SUBJECTIVE STATEMENT: 06/10/2024: pt states he remains about the same. Has been practicing HEP. Feels like  seated heel raises are easier but will feel some fatigue by the end of repetitions.   PERTINENT HISTORY:  ascending aortic aneurysm, Afib RVR, hx chest pain, GERD, peripheral neuropathy, insomnia, CI, MRSA, OSA, TIA Describes Afib as stable, no pacemaker   PAIN:  Reports hx of chronic back pain - standing, walking, bending; states it is actually improving some w/ chiropractic care   PRECAUTIONS: fall risk, cardiac hx  RED FLAGS: None   WEIGHT BEARING RESTRICTIONS: No  FALLS:  Has patient fallen in last 6 months? No  LIVING ENVIRONMENT: 2 story home, bed/bath upstairs and main floor. 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   PATIENT SURVEYS:  PSFS:  - walking, 5  - heel raise, 0  - sit<>stand, 4  Total: 9; avg 3  COGNITION: Overall cognitive status: Within functional limits for tasks assessed     SENSATION: Light touch intact BIL LE aside from plantar aspect of BIL feet R>L per pt report   LOWER EXTREMITY MMT:    MMT Right eval Left eval  Hip flexion 4 4  Hip abduction (modified sitting) 5 5  Hip internal rotation    Hip external rotation    Knee flexion 4- 4  Knee extension 5 5  Ankle dorsiflexion 5 5   (Blank rows  = not tested) (Key: WFL = within functional limits not formally assessed, * = concordant pain, s = stiffness/stretching sensation, NT = not tested)  Comments:     FUNCTIONAL TESTS:  5xSTS: 24.67sec w/o UE support  06/10/24: Heel raise 2 cm difference less on R than L   GAIT:  FGA (no AD):  -Item 1 Gait Level Surface: mild impairment 2  -Item 2 Change in Gait Speed: moderate impairment 1   -Item 3 Gait with Horizontal Head Turns: mild impairment 2  -Item 4 Gait with Vertical Head Turns: Normal 3  -Item 5 Gait with Pivot Turn: moderate impairment 1   -Item 6 Step Over Obstacle: mild impairment 2  -Item 7 Gait with Narrow Base of Support: severe impairment 0 -Item 8 Gait with Eyes Closed: mild impairment 2             -Item 9 Ambulating Backwards: moderate impairment 1  -Item 10 Steps: Normal 3 (per pt report but does require inc time/effort) Total: 17 /30  * Score of <=22/30 indicates that patient is at increased risk for falls.   TREATMENT DATE:  [facility] Adult PT Treatment:                                                DATE: 06/10/24 Therapeutic Exercise: Seated heel raise x15 BIL  Seated hamstring curl green band 2x10, cues/education for home setup  Seated green band PF 2x15 BIL cues for setup HEP update + education/handout, education throughout on relevant anatomy/physiology and rationale for interventions  Neuromuscular re-ed: Tandem walk 3 laps along counter CGA  STS 2x8 emphasis on eccentric control  Step up + contralat LE hover on airex x10 BIL CGA     [facility] Adult PT Treatment:                                                DATE: 06/03/24 Therapeutic Exercise: HEP practice + education/handout, emphasis on pacing and safe setup, rationale for interventions based on exam findings and relevant anatomy/physiology     PATIENT EDUCATION:  Education details: rationale for interventions, HEP  Person educated: Patient Education method: Explanation, Demonstration, Tactile cues,  Verbal cues Education comprehension: verbalized understanding, returned demonstration, verbal cues required, tactile cues required, and needs further education      HOME EXERCISE PROGRAM: Access Code: [email or web address] URL: [email or web address] Date: 06/10/2024 Prepared by: Alm Jenny  Exercises - Seated Heel Raise  - 2-3 x daily - 1 sets - 8-10 reps - Sit to Stand with Armchair  - 2-3 x daily - 1 sets - 5 reps - Standing Tandem Balance with Counter Support  - 2-3 x daily - 1 sets - 1-2 reps - 20-30sec hold - Seated Ankle Plantarflexion with Resistance  - 2-3 x daily - 1 sets - 15 reps - Seated Hamstring Curl with Anchored Resistance  - 2-3 x daily - 1 sets - 10 reps  ASSESSMENT:  CLINICAL IMPRESSION: 06/10/2024: Pt arrives w/o new updates. Today we expand program for focal LE strengthening and improved stability with closed chain tasks. He tolerates well without adverse event, HEP updated accordingly. Cues as above. Recommend continuing along current POC in order to address relevant deficits and improve functional tolerance. Pt departs today's session in no acute distress, all voiced questions/concerns addressed appropriately from PT perspective.    Per eval - Patient is a pleasant 78 y.o. gentleman who was seen today for physical therapy evaluation and treatment for gait disorder. He endorses gradually worsening balance/weakness over last several years which he attributes to his peripheral neuropathy. On exam he demonstrates mild reductions in focal MMT, reduced functional strength and fall risk as evidenced by 5xSTS, and fall risk as evidenced by FGA. Demonstrates impairments in change of pace, dual tasking, and BOS challenges. No adverse events, tolerates exam and HEP well overall. Recommend trial of skilled PT to address aforementioned deficits with aim of improving functional tolerance and reducing pain with typical activities. Pt departs today's session in no acute distress, all  voiced concerns/questions addressed appropriately from PT perspective.      OBJECTIVE IMPAIRMENTS: Abnormal gait, decreased activity tolerance, decreased balance, decreased endurance, decreased mobility, difficulty walking, decreased strength, impaired perceived functional ability, and improper body mechanics.   ACTIVITY LIMITATIONS: carrying, lifting, bending, standing, stairs, transfers, and locomotion level  PARTICIPATION LIMITATIONS: meal prep, cleaning, laundry, community activity, and yard work  PERSONAL FACTORS: Age, Time since onset of injury/illness/exacerbation, and 3+ comorbidities: ascending aortic aneurysm, Afib RVR, hx chest pain, GERD, peripheral neuropathy, insomnia, CI, MRSA, OSA, TIA are also affecting patient's functional outcome.   REHAB POTENTIAL: Good  CLINICAL DECISION MAKING: Evolving/moderate complexity  EVALUATION COMPLEXITY: Moderate   GOALS:   SHORT TERM GOALS: Target date: 06/24/2024   Pt will demonstrate appropriate understanding and performance of initially prescribed HEP in order to facilitate improved independence with management of symptoms.  Baseline: HEP established  Goal status: INITIAL   2. Pt will report at least 5 sec improvement in 5xSTS in order to reduce fall  risk. (MCID 2.3sec)  Baseline: 24 sec no UE support  Goal status: INITIAL    LONG TERM GOALS: Target date: 07/15/2024   Pt will score avg 6 or greater on PSFS in order to demonstrate improved perception of functional status due to symptoms.  Baseline: 3 avg Goal status: INITIAL  2.  Pt will demonstrate LE MMT of at least 4+/5 in tested groups in order to facilitate improved functional strength.  Baseline: see MMT chart above  Goal status: INITIAL   3.  Pt will score greater than or equal to 22/30 on Functional Gait assessment in order to indicate reduced fall risk (cutoff score </= 22/30 predictive of falls per Willye et al 2010, MCID 4 pts Beninato et al 2014)  Baseline:  17/30  Goal status: INITIAL   4. Pt will perform 5xSTS in </=14 sec in order to demonstrate reduced fall risk and improved functional independence. (MCID of 2.3sec)  Baseline: 24 sec no UE support  Goal status: INITIAL   5. Pt will demonstrate appropriate performance of final prescribed HEP in order to facilitate improved self-management of symptoms post-discharge.   Baseline: initial HEP prescribed  Goal status: INITIAL     PLAN:  PT FREQUENCY: 1-2x/week  PT DURATION: 6 weeks  PLANNED INTERVENTIONS: 97164- PT Re-evaluation, 97750- Physical Performance Testing, 97110-Therapeutic exercises, 97530- Therapeutic activity, 97112- Neuromuscular re-education, 97535- Self Care, [id number]- Manual therapy, [phone]- Gait training, Patient/Family education, Balance training, Stair training, Taping, Joint mobilization, Spinal mobilization, Cryotherapy, and Moist heat.  PLAN FOR NEXT SESSION: Review/update HEP PRN. Work on [employer] exercises as appropriate with emphasis on dual tasking, change of pace, functional strengthening. Symptom modification strategies as indicated/appropriate, mindful of cardiac history.    Alm DELENA Jenny PT, DPT 06/10/2024 1:02 PM

## 2024-06-11 NOTE — Progress Notes (Unsigned)
    Ben Jackson D.[facility] [address] [place] [address] Phone: [phone]   Assessment and Plan:     There are no diagnoses linked to this encounter.  ***   Pertinent previous records reviewed include ***    Follow Up: ***     Subjective:   I, Charleigh Correnti, am serving as a Neurosurgeon for Doctor Morene Mace  Chief Complaint: left elbow and wrist pain  HPI:   06/12/2024  Patient is a 78 year old male with left elbow and wrist pain. Patient states   Relevant Historical Information: ***  Additional pertinent review of systems negative.   Current Outpatient Medications:    aspirin  81 MG EC tablet, Take 1 tablet (81 mg total) by mouth daily. Swallow whole., Disp: 30 tablet, Rfl: 12   atorvastatin  (LIPITOR) 80 MG tablet, Take 1 tablet (80 mg total) by mouth daily., Disp: 90 tablet, Rfl: 3   cyanocobalamin  (VITAMIN B12) 100 MCG tablet, Take 100 mcg by mouth daily., Disp: , Rfl:    Lecanemab -irmb (LEQEMBI ) 200 MG/2ML SOLN, Inject 10 mg/kg into the vein every 14 (fourteen) days., Disp: , Rfl:    lisinopril  (ZESTRIL ) 5 MG tablet, Take 1 tablet (5 mg total) by mouth daily., Disp: 90 tablet, Rfl: 3   meloxicam (MOBIC) 15 MG tablet, Take 15 mg by mouth as needed for pain. TAKE 0.5-1 tablet  AS NEEDED, Disp: , Rfl:    metoprolol  succinate (TOPROL -XL) 25 MG 24 hr tablet, Take 2 tablets (50 mg total) by mouth every morning AND 0.5 tablets (12.5 mg total) every evening., Disp: 225 tablet, Rfl: 3   rivastigmine  (EXELON ) 3 MG capsule, Take 1 capsule (3 mg total) by mouth 2 (two) times daily., Disp: 180 capsule, Rfl: 3   tamsulosin  (FLOMAX ) 0.4 MG CAPS capsule, Take 0.4 mg by mouth daily., Disp: , Rfl:    VITAMIN D  PO, Take 1 tablet by mouth daily in the afternoon., Disp: , Rfl:    zolpidem  (AMBIEN ) 5 MG tablet, TAKE 1 TABLET BY MOUTH AT BEDTIME AS NEEDED FOR SLEEP., Disp: 30 tablet, Rfl: 0   Objective:     There were no vitals filed for this  visit.    There is no height or weight on file to calculate BMI.    Physical Exam:    ***   Electronically signed by:  Odis Mace D.[facility] 7:36 AM 06/11/24

## 2024-06-12 ENCOUNTER — Telehealth: Payer: Self-pay

## 2024-06-12 ENCOUNTER — Ambulatory Visit: Admitting: Sports Medicine

## 2024-06-12 ENCOUNTER — Ambulatory Visit (INDEPENDENT_AMBULATORY_CARE_PROVIDER_SITE_OTHER)

## 2024-06-12 VITALS — BP 124/82 | HR 64 | Ht 77.0 in | Wt 234.0 lb

## 2024-06-12 DIAGNOSIS — M7702 Medial epicondylitis, left elbow: Secondary | ICD-10-CM

## 2024-06-12 DIAGNOSIS — G8929 Other chronic pain: Secondary | ICD-10-CM

## 2024-06-12 DIAGNOSIS — M1852 Other unilateral secondary osteoarthritis of first carpometacarpal joint, left hand: Secondary | ICD-10-CM | POA: Diagnosis not present

## 2024-06-12 DIAGNOSIS — M51362 Other intervertebral disc degeneration, lumbar region with discogenic back pain and lower extremity pain: Secondary | ICD-10-CM

## 2024-06-12 DIAGNOSIS — M25522 Pain in left elbow: Secondary | ICD-10-CM

## 2024-06-12 DIAGNOSIS — M25532 Pain in left wrist: Secondary | ICD-10-CM

## 2024-06-12 NOTE — Patient Instructions (Addendum)
 Lumbar MRI  Elbow HEP  Recommend getting a wrist brace with thumb to use when in pain  Tennis elbow strap  Tylenol  [phone] mg 2-3 times a day for pain relief  - Start meloxicam 15 mg daily x2 weeks.  If still having pain after 2 weeks, complete 3rd-week of NSAID. May use remaining NSAID as needed once daily for pain control.  Do not to use additional over-the-counter NSAIDs (ibuprofen, naproxen, Advil, Aleve, etc.) while taking prescription NSAIDs.  May use Tylenol  [phone] mg 2 to 3 times a day for breakthrough pain. 4 week follow up

## 2024-06-12 NOTE — Telephone Encounter (Signed)
 Notified by [facility], pt is here for 24th infusion. Will inform Dr. Gregg

## 2024-06-17 ENCOUNTER — Ambulatory Visit: Payer: Self-pay | Admitting: Sports Medicine

## 2024-06-19 ENCOUNTER — Ambulatory Visit

## 2024-06-19 DIAGNOSIS — R2689 Other abnormalities of gait and mobility: Secondary | ICD-10-CM

## 2024-06-19 DIAGNOSIS — M6281 Muscle weakness (generalized): Secondary | ICD-10-CM | POA: Diagnosis not present

## 2024-06-19 NOTE — Therapy (Signed)
 OUTPATIENT PHYSICAL THERAPY TREATMENT   Patient Name: Dustin Berry. MRN: [id number] DOB:14-Jul-1946, 78 y.o., male Today's Date: 06/19/2024  END OF SESSION:  PT End of Session - 06/19/24 1221     Visit Number 3    Number of Visits 13    Date for PT Re-Evaluation 07/15/24    Authorization Type MDC    Progress Note Due on Visit 10    PT Start Time 1218    PT Stop Time 1257    PT Time Calculation (min) 39 min    Activity Tolerance Patient tolerated treatment well    Behavior During Therapy [facility] for tasks assessed/performed            Past Medical History:  Diagnosis Date   Abdominal pain 04/01/2013   Acute bronchitis 03/07/2018   Allergic rhinitis 05/12/2008   Aortic atherosclerosis 08/23/2020   Arthritis    Ascending aortic aneurysm 12/13/2018   Atrial fibrillation with RVR 10/21/2012   Echo- EF 50-55%; mild concentric LVH; flow pattern suggestive of impaired LV relaxation; mild mitral valve prolapse, trace mitral regurgitation   Back pain    receiving PT   Benign prostatic hyperplasia 09/05/2012   Boil of buttock 06/24/2011   Bradycardia 04/17/2013   Cataract    Cellulitis of knee, left 04/12/2016   Chest pain with exertion presumed to be tachycardia related along with SOB 04/01/2013   Degeneration of lumbar intervertebral disc 12/10/2020   Eustachian tube dysfunction, bilateral 03/07/2018   External otitis of left ear 06/11/2020   Fatigue 07/04/2011   GERD (gastroesophageal reflux disease) 03/24/2009   Heart murmur    Hematuria, possible 04/01/2013   Hereditary and idiopathic peripheral neuropathy 05/12/2008   HLD (hyperlipidemia) 08/18/2007   HTN (hypertension) 12/04/2013   Insomnia 06/08/2016   Left knee pain 12/13/2018   Low back pain 12/01/2020   Lumbar spondylosis 05/05/2021   Mild cognitive impairment of uncertain or unknown etiology 12/09/2022   Mitral valve prolapse 07/04/2011   MRSA (methicillin resistant staph aureus) culture positive 5+  years ago   Neck pain 05/05/2021   OSA (obstructive sleep apnea) 07/04/2011   Using CPAP nightly   Pain in right hand 02/04/2020   Palpitations 03/06/2007   R/P MV - mild perfusion defect in basal inferoseptal, basal inferior, mid inferoseptal, and mid inferior regions, consistent w/ infarct/scar; no scintigraphic evidence of inducible myocardial ischemia; prior non transmural infarct cannot be completely excluded; EF 48%; no significant change from previous study   Personal history of colonic polyps 05/12/2008   Radicular pain in left arm 05/05/2021   Sacroiliitis 05/05/2021   Stricture and stenosis of esophagus 03/24/2009   TIA (transient ischemic attack) 11/22/2021   Tinnitus 09/05/2012   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   Vitamin B12 deficiency 06/15/2019   Vitamin D  deficiency 05/23/2022   Past Surgical History:  Procedure Laterality Date   ATRIAL FIBRILLATION ABLATION     CARDIOVERSION N/A 04/03/2013   Procedure: CARDIOVERSION;  Surgeon: Vinie KYM Maxcy, MD;  Location: [facility] ENDOSCOPY;  Service: Cardiovascular;  Laterality: N/A;   COLONOSCOPY  2018   HAND SURGERY     Thumb joint repair   POLYPECTOMY     TEE WITHOUT CARDIOVERSION N/A 04/03/2013   Procedure: TRANSESOPHAGEAL ECHOCARDIOGRAM (TEE);  Surgeon: Vinie KYM Maxcy, MD;  Location: [facility] ENDOSCOPY;  Service: Cardiovascular;  Laterality: N/A;   TONSILLECTOMY AND ADENOIDECTOMY     UPPER GASTROINTESTINAL ENDOSCOPY     dilation   Patient Active Problem List   Diagnosis Date Noted  Left lateral epicondylitis 05/25/2024   Gait disorder 05/25/2024   Arthritis of left wrist 03/04/2024   Encounter for orthopedic follow-up care 03/04/2024   Right upper quadrant abdominal pain 01/27/2024   Alzheimer dementia (HCC) 12/26/2023   Mild cognitive impairment of uncertain or unknown etiology 12/09/2022   Vitamin D  deficiency 05/23/2022   TIA (transient ischemic attack) 11/22/2021   Aortic root aneurysm 07/30/2021   Lumbar spondylosis  05/05/2021   Neck pain 05/05/2021   Radicular pain in left arm 05/05/2021   Sacroiliitis 05/05/2021   Degeneration of lumbar intervertebral disc 12/10/2020   Low back pain 12/01/2020   Pain of right hip joint 10/22/2020   Aortic atherosclerosis 08/23/2020   External otitis of left ear 06/11/2020   Pain in right hand 02/04/2020   Vitamin B12 deficiency 06/15/2019   Weight loss 12/13/2018   Left knee pain 12/13/2018   Ascending aortic aneurysm 12/13/2018   Eustachian tube dysfunction, bilateral 03/07/2018   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   Insomnia 06/08/2016   Cellulitis of knee, left 04/12/2016   HTN (hypertension) 12/04/2013   Bradycardia 04/17/2013   Atrial fibrillation with RVR 04/01/2013   Chest pain with exertion presumed to be tachycardia related along with SOB 04/01/2013   Hematuria 04/01/2013   Benign prostatic hyperplasia 09/05/2012   Tinnitus 09/05/2012   OSA (obstructive sleep apnea) 07/04/2011   Mitral valve prolapse 07/04/2011   Fatigue 07/04/2011   Stricture and stenosis of esophagus 03/24/2009   GERD (gastroesophageal reflux disease) 03/24/2009   Hereditary and idiopathic peripheral neuropathy 05/12/2008   Allergic rhinitis 05/12/2008   History of colonic polyps 05/12/2008   HLD (hyperlipidemia) 08/18/2007    PCP: Norleen Lynwood ORN, MD  REFERRING PROVIDER: Norleen Lynwood ORN, MD  REFERRING DIAG: R26.9 (ICD-10-CM) - Gait disorder  Rationale for Evaluation and Treatment: Rehabilitation  THERAPY DIAG:  Muscle weakness (generalized)  Other abnormalities of gait and mobility  ONSET DATE: gradually worsening over past several years  SUBJECTIVE:                                                                                                                                                                                          Per eval: Pt endorses history of peripheral neuropathy with gradually worsening weakness which is now affecting his walking. Feels his calves  are weaker, can no longer do heel raise. Feels his light touch is muted pretty significantly in both feet.  Used to be quite active, enjoyed playing basketball into his 37s. Enjoys going to gym but has been less active over last couple years.  Difficulty navigating hills, prolonged walking.   SUBJECTIVE STATEMENT: 06/19/2024: Patient reports  that he has been weaning from meloxicam over the last few days. As a result, he has more pain in his knees and hands.    PERTINENT HISTORY:  ascending aortic aneurysm, Afib RVR, hx chest pain, GERD, peripheral neuropathy, insomnia, CI, MRSA, OSA, TIA Describes Afib as stable, no pacemaker   PAIN:  Reports hx of chronic back pain - standing, walking, bending; states it is actually improving some w/ chiropractic care   PRECAUTIONS: fall risk, cardiac hx  RED FLAGS: None   WEIGHT BEARING RESTRICTIONS: No  FALLS:  Has patient fallen in last 6 months? No  LIVING ENVIRONMENT: 2 story home, bed/bath upstairs and main floor. 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   PATIENT SURVEYS:  PSFS:  - walking, 5  - heel raise, 0  - sit<>stand, 4  Total: 9; avg 3  COGNITION: Overall cognitive status: Within functional limits for tasks assessed     SENSATION: Light touch intact BIL LE aside from plantar aspect of BIL feet R>L per pt report   LOWER EXTREMITY MMT:    MMT Right eval Left eval  Hip flexion 4 4  Hip abduction (modified sitting) 5 5  Hip internal rotation    Hip external rotation    Knee flexion 4- 4  Knee extension  5 5  Ankle dorsiflexion 5 5   (Blank rows = not tested) (Key: WFL = within functional limits not formally assessed, * = concordant pain, s = stiffness/stretching sensation, NT = not tested)  Comments:     FUNCTIONAL TESTS:  5xSTS: 24.67sec w/o UE support  06/10/24: Heel raise 2 cm difference less on R than L   GAIT:  FGA (no AD):  -Item 1 Gait Level Surface: mild impairment 2  -Item 2 Change in Gait Speed: moderate impairment 1   -Item 3 Gait with Horizontal Head Turns: mild impairment 2  -Item 4 Gait with Vertical Head Turns: Normal 3  -Item 5 Gait with Pivot Turn: moderate impairment 1   -Item 6 Step Over Obstacle: mild impairment 2  -Item 7 Gait with Narrow Base of Support: severe impairment 0 -Item 8 Gait with Eyes Closed: mild impairment 2             -Item 9 Ambulating Backwards: moderate impairment 1  -Item 10 Steps: Normal 3 (per pt report but does require inc time/effort) Total: 17 /30  * Score of <=22/30 indicates that patient is at increased risk for falls.   TREATMENT DATE:    [facility] Adult PT Treatment:                                                DATE: 06/19/2024  Therapeutic Exercise:  Seated heel raise x15 BIL  Seated hamstring curl green band 2x10, cues/education for home setup  Seated green band PF 2x15 BIL cues for setup  Neuromuscular re-ed: Tandem walk 3 laps along counter CGA  STS 2x8 emphasis on eccentric  control  Forward Step up 8 step 2 x 10  Lateral Step Up 8 step 2 x 10  Cone taps 2 x 30 sec each   [facility] Adult PT Treatment:                                                DATE: 06/10/24 Therapeutic Exercise: Seated heel raise x15 BIL  Seated hamstring curl green band 2x10, cues/education for home setup  Seated green band PF 2x15 BIL cues for setup HEP update + education/handout, education throughout on relevant anatomy/physiology and rationale for interventions  Neuromuscular re-ed: Tandem walk 3 laps along counter CGA  STS 2x8 emphasis on  eccentric control  Step up + contralat LE hover on airex x10 BIL CGA     [facility] Adult PT Treatment:                                                DATE: 06/03/24 Therapeutic Exercise: HEP practice + education/handout, emphasis on pacing and safe setup, rationale for interventions based on exam findings and relevant anatomy/physiology     PATIENT EDUCATION:  Education details: rationale for interventions, HEP  Person educated: Patient Education method: Explanation, Demonstration, Tactile cues, Verbal cues Education comprehension: verbalized understanding, returned demonstration, verbal cues required, tactile cues required, and needs further education      HOME EXERCISE PROGRAM: Access Code: [email or web address] URL: [email or web address] Date: 06/10/2024 Prepared by: Alm Jenny  Exercises - Seated Heel Raise  - 2-3 x daily - 1 sets - 8-10 reps - Sit to Stand with Armchair  - 2-3 x daily - 1 sets - 5 reps - Standing Tandem Balance with Counter Support  - 2-3 x daily - 1 sets - 1-2 reps - 20-30sec hold - Seated Ankle Plantarflexion with Resistance  - 2-3 x daily - 1 sets - 15 reps - Seated Hamstring Curl with Anchored Resistance  - 2-3 x daily - 1 sets - 10 reps  ASSESSMENT:  CLINICAL IMPRESSION: 06/19/2024: Patient had good tolerance of today's treatment session, which focused on progression of LE strengthening and balance program. Patient had some difficulty with cone taps towards end of session.. We will continue to progress per POC as tolerated, in order to reach established rehab goals.    Per eval - Patient is a pleasant 78 y.o. gentleman who was seen today for physical therapy evaluation and treatment for gait disorder. He endorses gradually worsening balance/weakness over last several years which he attributes to his peripheral neuropathy. On exam he demonstrates mild reductions in focal MMT, reduced functional strength and fall risk as evidenced by 5xSTS, and fall risk as  evidenced by FGA. Demonstrates impairments in change of pace, dual tasking, and BOS challenges. No adverse events, tolerates exam and HEP well overall. Recommend trial of skilled PT to address aforementioned deficits with aim of improving functional tolerance and reducing pain with typical activities. Pt departs today's session in no acute distress, all voiced concerns/questions addressed appropriately from PT perspective.      OBJECTIVE IMPAIRMENTS: Abnormal gait, decreased activity tolerance, decreased balance, decreased endurance, decreased mobility, difficulty walking, decreased strength, impaired perceived functional ability, and improper body mechanics.   ACTIVITY LIMITATIONS: carrying, lifting, bending, standing, stairs, transfers, and  locomotion level  PARTICIPATION LIMITATIONS: meal prep, cleaning, laundry, community activity, and yard work  PERSONAL FACTORS: Age, Time since onset of injury/illness/exacerbation, and 3+ comorbidities: ascending aortic aneurysm, Afib RVR, hx chest pain, GERD, peripheral neuropathy, insomnia, CI, MRSA, OSA, TIA are also affecting patient's functional outcome.   REHAB POTENTIAL: Good  CLINICAL DECISION MAKING: Evolving/moderate complexity  EVALUATION COMPLEXITY: Moderate   GOALS:   SHORT TERM GOALS: Target date: 06/24/2024   Pt will demonstrate appropriate understanding and performance of initially prescribed HEP in order to facilitate improved independence with management of symptoms.  Baseline: HEP established  Goal status: INITIAL   2. Pt will report at least 5 sec improvement in 5xSTS in order to reduce fall risk. (MCID 2.3sec)  Baseline: 24 sec no UE support  Goal status: INITIAL    LONG TERM GOALS: Target date: 07/15/2024   Pt will score avg 6 or greater on PSFS in order to demonstrate improved perception of functional status due to symptoms.  Baseline: 3 avg Goal status: INITIAL  2.  Pt will demonstrate LE MMT of at least 4+/5 in tested  groups in order to facilitate improved functional strength.  Baseline: see MMT chart above  Goal status: INITIAL   3.  Pt will score greater than or equal to 22/30 on Functional Gait assessment in order to indicate reduced fall risk (cutoff score </= 22/30 predictive of falls per Willye et al 2010, MCID 4 pts Beninato et al 2014)  Baseline: 17/30  Goal status: INITIAL   4. Pt will perform 5xSTS in </=14 sec in order to demonstrate reduced fall risk and improved functional independence. (MCID of 2.3sec)  Baseline: 24 sec no UE support  Goal status: INITIAL   5. Pt will demonstrate appropriate performance of final prescribed HEP in order to facilitate improved self-management of symptoms post-discharge.   Baseline: initial HEP prescribed  Goal status: INITIAL     PLAN:  PT FREQUENCY: 1-2x/week  PT DURATION: 6 weeks  PLANNED INTERVENTIONS: [id number]- PT Re-evaluation, 97750- Physical Performance Testing, 97110-Therapeutic exercises, 97530- Therapeutic activity, 97112- Neuromuscular re-education, 97535- Self Care, [id number]- Manual therapy, [phone]- Gait training, Patient/Family education, Balance training, Stair training, Taping, Joint mobilization, Spinal mobilization, Cryotherapy, and Moist heat.  PLAN FOR NEXT SESSION: Review/update HEP PRN. Work on [employer] exercises as appropriate with emphasis on dual tasking, change of pace, functional strengthening. Symptom modification strategies as indicated/appropriate, mindful of cardiac history.    Marko Molt, PT, DPT  06/19/2024 6:10 PM

## 2024-06-21 ENCOUNTER — Ambulatory Visit

## 2024-06-21 DIAGNOSIS — M6281 Muscle weakness (generalized): Secondary | ICD-10-CM | POA: Diagnosis not present

## 2024-06-21 DIAGNOSIS — R2689 Other abnormalities of gait and mobility: Secondary | ICD-10-CM

## 2024-06-21 NOTE — Therapy (Signed)
 OUTPATIENT PHYSICAL THERAPY TREATMENT   Patient Name: Dustin Berry. MRN: [id number] DOB:09-14-1946, 78 y.o., male Today's Date: 06/21/2024  END OF SESSION:  PT End of Session - 06/21/24 1029     Visit Number 4    Number of Visits 13    Date for PT Re-Evaluation 07/15/24    Authorization Type MDC    Progress Note Due on Visit 10    PT Start Time 1030    PT Stop Time 1110    PT Time Calculation (min) 40 min    Activity Tolerance Patient tolerated treatment well    Behavior During Therapy [facility] for tasks assessed/performed             Past Medical History:  Diagnosis Date   Abdominal pain 04/01/2013   Acute bronchitis 03/07/2018   Allergic rhinitis 05/12/2008   Aortic atherosclerosis 08/23/2020   Arthritis    Ascending aortic aneurysm 12/13/2018   Atrial fibrillation with RVR 10/21/2012   Echo- EF 50-55%; mild concentric LVH; flow pattern suggestive of impaired LV relaxation; mild mitral valve prolapse, trace mitral regurgitation   Back pain    receiving PT   Benign prostatic hyperplasia 09/05/2012   Boil of buttock 06/24/2011   Bradycardia 04/17/2013   Cataract    Cellulitis of knee, left 04/12/2016   Chest pain with exertion presumed to be tachycardia related along with SOB 04/01/2013   Degeneration of lumbar intervertebral disc 12/10/2020   Eustachian tube dysfunction, bilateral 03/07/2018   External otitis of left ear 06/11/2020   Fatigue 07/04/2011   GERD (gastroesophageal reflux disease) 03/24/2009   Heart murmur    Hematuria, possible 04/01/2013   Hereditary and idiopathic peripheral neuropathy 05/12/2008   HLD (hyperlipidemia) 08/18/2007   HTN (hypertension) 12/04/2013   Insomnia 06/08/2016   Left knee pain 12/13/2018   Low back pain 12/01/2020   Lumbar spondylosis 05/05/2021   Mild cognitive impairment of uncertain or unknown etiology 12/09/2022   Mitral valve prolapse 07/04/2011   MRSA (methicillin resistant staph aureus) culture positive 5+  years ago   Neck pain 05/05/2021   OSA (obstructive sleep apnea) 07/04/2011   Using CPAP nightly   Pain in right hand 02/04/2020   Palpitations 03/06/2007   R/P MV - mild perfusion defect in basal inferoseptal, basal inferior, mid inferoseptal, and mid inferior regions, consistent w/ infarct/scar; no scintigraphic evidence of inducible myocardial ischemia; prior non transmural infarct cannot be completely excluded; EF 48%; no significant change from previous study   Personal history of colonic polyps 05/12/2008   Radicular pain in left arm 05/05/2021   Sacroiliitis 05/05/2021   Stricture and stenosis of esophagus 03/24/2009   TIA (transient ischemic attack) 11/22/2021   Tinnitus 09/05/2012   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   Vitamin B12 deficiency 06/15/2019   Vitamin D  deficiency 05/23/2022   Past Surgical History:  Procedure Laterality Date   ATRIAL FIBRILLATION ABLATION     CARDIOVERSION N/A 04/03/2013   Procedure: CARDIOVERSION;  Surgeon: Vinie KYM Maxcy, MD;  Location: [facility] ENDOSCOPY;  Service: Cardiovascular;  Laterality: N/A;   COLONOSCOPY  2018   HAND SURGERY     Thumb joint repair   POLYPECTOMY     TEE WITHOUT CARDIOVERSION N/A 04/03/2013   Procedure: TRANSESOPHAGEAL ECHOCARDIOGRAM (TEE);  Surgeon: Vinie KYM Maxcy, MD;  Location: [facility] ENDOSCOPY;  Service: Cardiovascular;  Laterality: N/A;   TONSILLECTOMY AND ADENOIDECTOMY     UPPER GASTROINTESTINAL ENDOSCOPY     dilation   Patient Active Problem List   Diagnosis Date  Noted   Left lateral epicondylitis 05/25/2024   Gait disorder 05/25/2024   Arthritis of left wrist 03/04/2024   Encounter for orthopedic follow-up care 03/04/2024   Right upper quadrant abdominal pain 01/27/2024   Alzheimer dementia (HCC) 12/26/2023   Mild cognitive impairment of uncertain or unknown etiology 12/09/2022   Vitamin D  deficiency 05/23/2022   TIA (transient ischemic attack) 11/22/2021   Aortic root aneurysm 07/30/2021   Lumbar spondylosis  05/05/2021   Neck pain 05/05/2021   Radicular pain in left arm 05/05/2021   Sacroiliitis 05/05/2021   Degeneration of lumbar intervertebral disc 12/10/2020   Low back pain 12/01/2020   Pain of right hip joint 10/22/2020   Aortic atherosclerosis 08/23/2020   External otitis of left ear 06/11/2020   Pain in right hand 02/04/2020   Vitamin B12 deficiency 06/15/2019   Weight loss 12/13/2018   Left knee pain 12/13/2018   Ascending aortic aneurysm 12/13/2018   Eustachian tube dysfunction, bilateral 03/07/2018   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   Insomnia 06/08/2016   Cellulitis of knee, left 04/12/2016   HTN (hypertension) 12/04/2013   Bradycardia 04/17/2013   Atrial fibrillation with RVR 04/01/2013   Chest pain with exertion presumed to be tachycardia related along with SOB 04/01/2013   Hematuria 04/01/2013   Benign prostatic hyperplasia 09/05/2012   Tinnitus 09/05/2012   OSA (obstructive sleep apnea) 07/04/2011   Mitral valve prolapse 07/04/2011   Fatigue 07/04/2011   Stricture and stenosis of esophagus 03/24/2009   GERD (gastroesophageal reflux disease) 03/24/2009   Hereditary and idiopathic peripheral neuropathy 05/12/2008   Allergic rhinitis 05/12/2008   History of colonic polyps 05/12/2008   HLD (hyperlipidemia) 08/18/2007    PCP: Norleen Lynwood ORN, MD  REFERRING PROVIDER: Norleen Lynwood ORN, MD  REFERRING DIAG: R26.9 (ICD-10-CM) - Gait disorder  Rationale for Evaluation and Treatment: Rehabilitation  THERAPY DIAG:  Muscle weakness (generalized)  Other abnormalities of gait and mobility  ONSET DATE: gradually worsening over past several years  SUBJECTIVE:                                                                                                                                                                                          Per eval: Pt endorses history of peripheral neuropathy with gradually worsening weakness which is now affecting his walking. Feels his calves  are weaker, can no longer do heel raise. Feels his light touch is muted pretty significantly in both feet.  Used to be quite active, enjoyed playing basketball into his 36s. Enjoys going to gym but has been less active over last couple years.  Difficulty navigating hills, prolonged walking.   SUBJECTIVE STATEMENT:  06/21/2024: Patient reports that he normally has soreness in his calves after walking, but did not have that after his last visit.     PERTINENT HISTORY:  ascending aortic aneurysm, Afib RVR, hx chest pain, GERD, peripheral neuropathy, insomnia, CI, MRSA, OSA, TIA Describes Afib as stable, no pacemaker   PAIN:  Reports hx of chronic back pain - standing, walking, bending; states it is actually improving some w/ chiropractic care   PRECAUTIONS: fall risk, cardiac hx  RED FLAGS: None   WEIGHT BEARING RESTRICTIONS: No  FALLS:  Has patient fallen in last 6 months? No  LIVING ENVIRONMENT: 2 story home, bed/bath upstairs and main floor. 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   PATIENT SURVEYS:  PSFS:  - walking, 5  - heel raise, 0  - sit<>stand, 4  Total: 9; avg 3  COGNITION: Overall cognitive status: Within functional limits for tasks assessed     SENSATION: Light touch intact BIL LE aside from plantar aspect of BIL feet R>L per pt report   LOWER EXTREMITY MMT:    MMT Right eval Left eval  Hip flexion 4 4  Hip abduction (modified sitting) 5 5  Hip internal rotation    Hip external rotation    Knee flexion 4- 4  Knee extension 5 5  Ankle  dorsiflexion 5 5   (Blank rows = not tested) (Key: WFL = within functional limits not formally assessed, * = concordant pain, s = stiffness/stretching sensation, NT = not tested)  Comments:     FUNCTIONAL TESTS:  5xSTS: 24.67sec w/o UE support  06/10/24: Heel raise 2 cm difference less on R than L   GAIT:  FGA (no AD):  -Item 1 Gait Level Surface: mild impairment 2  -Item 2 Change in Gait Speed: moderate impairment 1   -Item 3 Gait with Horizontal Head Turns: mild impairment 2  -Item 4 Gait with Vertical Head Turns: Normal 3  -Item 5 Gait with Pivot Turn: moderate impairment 1   -Item 6 Step Over Obstacle: mild impairment 2  -Item 7 Gait with Narrow Base of Support: severe impairment 0 -Item 8 Gait with Eyes Closed: mild impairment 2             -Item 9 Ambulating Backwards: moderate impairment 1  -Item 10 Steps: Normal 3 (per pt report but does require inc time/effort) Total: 17 /30  * Score of <=22/30 indicates that patient is at increased risk for falls.   TREATMENT DATE:    [facility] Adult PT Treatment:                                                DATE: 06/21/2024  Therapeutic Exercise:  Seated heel raise 3x10 BIL x 10 lb  Seated toe raise x20 Seated hamstring curl blue band x15 Seated blue band PF x15 BIL cues for setup  Neuromuscular re-ed: Hurdle step overs (4 hurdles) x 3 laps fwd, x 3 laps  lateral (+ 1 airex between 2 hurdles) STS 2x8 emphasis on eccentric control  Forward Step up 8 step + airex 2 x 10  Lateral Step Up 8 step + airex  x 10  Cone taps 2 x 30 sec each    [facility] Adult PT Treatment:                                                DATE: 06/19/2024  Therapeutic Exercise:  Seated heel raise x15 BIL  Seated hamstring curl green band 2x10, cues/education for home setup  Seated green band PF 2x15 BIL cues for setup  Neuromuscular re-ed: Tandem walk 3 laps along counter CGA  STS 2x8 emphasis on eccentric control  Forward Step up 8 step 2 x 10   Lateral Step Up 8 step 2 x 10  Cone taps 2 x 30 sec each   [facility] Adult PT Treatment:                                                DATE: 06/10/24 Therapeutic Exercise: Seated heel raise x15 BIL  Seated hamstring curl green band 2x10, cues/education for home setup  Seated green band PF 2x15 BIL cues for setup HEP update + education/handout, education throughout on relevant anatomy/physiology and rationale for interventions  Neuromuscular re-ed: Tandem walk 3 laps along counter CGA  STS 2x8 emphasis on eccentric control  Step up + contralat LE hover on airex x10 BIL CGA     [facility] Adult PT Treatment:                                                DATE: 06/03/24 Therapeutic Exercise: HEP practice + education/handout, emphasis on pacing and safe setup, rationale for interventions based on exam findings and relevant anatomy/physiology     PATIENT EDUCATION:  Education details: rationale for interventions, HEP  Person educated: Patient Education method: Explanation, Demonstration, Tactile cues, Verbal cues Education comprehension: verbalized understanding, returned demonstration, verbal cues required, tactile cues required, and needs further education      HOME EXERCISE PROGRAM: Access Code: [email or web address] URL: [email or web address] Date: 06/10/2024 Prepared by: Alm Jenny  Exercises - Seated Heel Raise  - 2-3 x daily - 1 sets - 8-10 reps - Sit to Stand with Armchair  - 2-3 x daily - 1 sets - 5 reps - Standing Tandem Balance with Counter Support  - 2-3 x daily - 1 sets - 1-2 reps - 20-30sec hold - Seated Ankle Plantarflexion with Resistance  - 2-3 x daily - 1 sets - 15 reps - Seated Hamstring Curl with Anchored Resistance  - 2-3 x daily - 1 sets - 10 reps  ASSESSMENT:  CLINICAL IMPRESSION: 06/21/2024: Patient had good tolerance of today's treatment session, which focused on progression of LE strengthening and balance program. Patient had some difficulty with cone taps  towards end of session.. We will continue to progress per POC as tolerated, in order to reach established rehab goals.    Per eval - Patient is a pleasant 78 y.o. gentleman who was seen today for physical therapy evaluation  and treatment for gait disorder. He endorses gradually worsening balance/weakness over last several years which he attributes to his peripheral neuropathy. On exam he demonstrates mild reductions in focal MMT, reduced functional strength and fall risk as evidenced by 5xSTS, and fall risk as evidenced by FGA. Demonstrates impairments in change of pace, dual tasking, and BOS challenges. No adverse events, tolerates exam and HEP well overall. Recommend trial of skilled PT to address aforementioned deficits with aim of improving functional tolerance and reducing pain with typical activities. Pt departs today's session in no acute distress, all voiced concerns/questions addressed appropriately from PT perspective.      OBJECTIVE IMPAIRMENTS: Abnormal gait, decreased activity tolerance, decreased balance, decreased endurance, decreased mobility, difficulty walking, decreased strength, impaired perceived functional ability, and improper body mechanics.   ACTIVITY LIMITATIONS: carrying, lifting, bending, standing, stairs, transfers, and locomotion level  PARTICIPATION LIMITATIONS: meal prep, cleaning, laundry, community activity, and yard work  PERSONAL FACTORS: Age, Time since onset of injury/illness/exacerbation, and 3+ comorbidities: ascending aortic aneurysm, Afib RVR, hx chest pain, GERD, peripheral neuropathy, insomnia, CI, MRSA, OSA, TIA are also affecting patient's functional outcome.   REHAB POTENTIAL: Good  CLINICAL DECISION MAKING: Evolving/moderate complexity  EVALUATION COMPLEXITY: Moderate   GOALS:   SHORT TERM GOALS: Target date: 06/24/2024   Pt will demonstrate appropriate understanding and performance of initially prescribed HEP in order to facilitate improved  independence with management of symptoms.  Baseline: HEP established  Goal status: INITIAL   2. Pt will report at least 5 sec improvement in 5xSTS in order to reduce fall risk. (MCID 2.3sec)  Baseline: 24 sec no UE support  Goal status: INITIAL    LONG TERM GOALS: Target date: 07/15/2024   Pt will score avg 6 or greater on PSFS in order to demonstrate improved perception of functional status due to symptoms.  Baseline: 3 avg Goal status: INITIAL  2.  Pt will demonstrate LE MMT of at least 4+/5 in tested groups in order to facilitate improved functional strength.  Baseline: see MMT chart above  Goal status: INITIAL   3.  Pt will score greater than or equal to 22/30 on Functional Gait assessment in order to indicate reduced fall risk (cutoff score </= 22/30 predictive of falls per Willye et al 2010, MCID 4 pts Beninato et al 2014)  Baseline: 17/30  Goal status: INITIAL   4. Pt will perform 5xSTS in </=14 sec in order to demonstrate reduced fall risk and improved functional independence. (MCID of 2.3sec)  Baseline: 24 sec no UE support  Goal status: INITIAL   5. Pt will demonstrate appropriate performance of final prescribed HEP in order to facilitate improved self-management of symptoms post-discharge.   Baseline: initial HEP prescribed  Goal status: INITIAL     PLAN:  PT FREQUENCY: 1-2x/week  PT DURATION: 6 weeks  PLANNED INTERVENTIONS: [id number]- PT Re-evaluation, 97750- Physical Performance Testing, 97110-Therapeutic exercises, 97530- Therapeutic activity, 97112- Neuromuscular re-education, 97535- Self Care, [id number]- Manual therapy, [phone]- Gait training, Patient/Family education, Balance training, Stair training, Taping, Joint mobilization, Spinal mobilization, Cryotherapy, and Moist heat.  PLAN FOR NEXT SESSION: Review/update HEP PRN. Work on [employer] exercises as appropriate with emphasis on dual tasking, change of pace, functional strengthening. Symptom modification strategies  as indicated/appropriate, mindful of cardiac history.    Marko Molt, PT, DPT  06/21/2024 11:35 AM

## 2024-06-21 NOTE — Therapy (Signed)
 OUTPATIENT PHYSICAL THERAPY TREATMENT   Patient Name: Dustin Berry. MRN: [id number] DOB:[birth year], 78 y.o., male Today's Date: 06/24/2024  END OF SESSION:  PT End of Session - 06/24/24 1233     Visit Number 5    Number of Visits 13    Date for PT Re-Evaluation 07/15/24    Authorization Type MDC    Progress Note Due on Visit 10    PT Start Time 1233   late check in   PT Stop Time 1300    PT Time Calculation (min) 27 min    Activity Tolerance Patient tolerated treatment well             Past Medical History:  Diagnosis Date   Abdominal pain 04/01/2013   Acute bronchitis 03/07/2018   Allergic rhinitis 05/12/2008   Aortic atherosclerosis 08/23/2020   Arthritis    Ascending aortic aneurysm 12/13/2018   Atrial fibrillation with RVR 10/21/2012   Echo- EF 50-55%; mild concentric LVH; flow pattern suggestive of impaired LV relaxation; mild mitral valve prolapse, trace mitral regurgitation   Back pain    receiving PT   Benign prostatic hyperplasia 09/05/2012   Boil of buttock 06/24/2011   Bradycardia 04/17/2013   Cataract    Cellulitis of knee, left 04/12/2016   Chest pain with exertion presumed to be tachycardia related along with SOB 04/01/2013   Degeneration of lumbar intervertebral disc 12/10/2020   Eustachian tube dysfunction, bilateral 03/07/2018   External otitis of left ear 06/11/2020   Fatigue 07/04/2011   GERD (gastroesophageal reflux disease) 03/24/2009   Heart murmur    Hematuria, possible 04/01/2013   Hereditary and idiopathic peripheral neuropathy 05/12/2008   HLD (hyperlipidemia) 08/18/2007   HTN (hypertension) 12/04/2013   Insomnia 06/08/2016   Left knee pain 12/13/2018   Low back pain 12/01/2020   Lumbar spondylosis 05/05/2021   Mild cognitive impairment of uncertain or unknown etiology 12/09/2022   Mitral valve prolapse 07/04/2011   MRSA (methicillin resistant staph aureus) culture positive 5+ years ago   Neck pain 05/05/2021   OSA  (obstructive sleep apnea) 07/04/2011   Using CPAP nightly   Pain in right hand 02/04/2020   Palpitations 03/06/2007   R/P MV - mild perfusion defect in basal inferoseptal, basal inferior, mid inferoseptal, and mid inferior regions, consistent w/ infarct/scar; no scintigraphic evidence of inducible myocardial ischemia; prior non transmural infarct cannot be completely excluded; EF 48%; no significant change from previous study   Personal history of colonic polyps 05/12/2008   Radicular pain in left arm 05/05/2021   Sacroiliitis 05/05/2021   Stricture and stenosis of esophagus 03/24/2009   TIA (transient ischemic attack) 11/22/2021   Tinnitus 09/05/2012   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   Vitamin B12 deficiency 06/15/2019   Vitamin D  deficiency 05/23/2022   Past Surgical History:  Procedure Laterality Date   ATRIAL FIBRILLATION ABLATION     CARDIOVERSION N/A 04/03/2013   Procedure: CARDIOVERSION;  Surgeon: Vinie KYM Maxcy, MD;  Location: [facility] ENDOSCOPY;  Service: Cardiovascular;  Laterality: N/A;   COLONOSCOPY  2018   HAND SURGERY     Thumb joint repair   POLYPECTOMY     TEE WITHOUT CARDIOVERSION N/A 04/03/2013   Procedure: TRANSESOPHAGEAL ECHOCARDIOGRAM (TEE);  Surgeon: Vinie KYM Maxcy, MD;  Location: [facility] ENDOSCOPY;  Service: Cardiovascular;  Laterality: N/A;   TONSILLECTOMY AND ADENOIDECTOMY     UPPER GASTROINTESTINAL ENDOSCOPY     dilation   Patient Active Problem List   Diagnosis Date Noted   Left lateral epicondylitis  05/25/2024   Gait disorder 05/25/2024   Arthritis of left wrist 03/04/2024   Encounter for orthopedic follow-up care 03/04/2024   Right upper quadrant abdominal pain 01/27/2024   Alzheimer dementia (HCC) 12/26/2023   Mild cognitive impairment of uncertain or unknown etiology 12/09/2022   Vitamin D  deficiency 05/23/2022   TIA (transient ischemic attack) 11/22/2021   Aortic root aneurysm 07/30/2021   Lumbar spondylosis 05/05/2021   Neck pain 05/05/2021    Radicular pain in left arm 05/05/2021   Sacroiliitis 05/05/2021   Degeneration of lumbar intervertebral disc 12/10/2020   Low back pain 12/01/2020   Pain of right hip joint 10/22/2020   Aortic atherosclerosis 08/23/2020   External otitis of left ear 06/11/2020   Pain in right hand 02/04/2020   Vitamin B12 deficiency 06/15/2019   Weight loss 12/13/2018   Left knee pain 12/13/2018   Ascending aortic aneurysm 12/13/2018   Eustachian tube dysfunction, bilateral 03/07/2018   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   Insomnia 06/08/2016   Cellulitis of knee, left 04/12/2016   HTN (hypertension) 12/04/2013   Bradycardia 04/17/2013   Atrial fibrillation with RVR 04/01/2013   Chest pain with exertion presumed to be tachycardia related along with SOB 04/01/2013   Hematuria 04/01/2013   Benign prostatic hyperplasia 09/05/2012   Tinnitus 09/05/2012   OSA (obstructive sleep apnea) 07/04/2011   Mitral valve prolapse 07/04/2011   Fatigue 07/04/2011   Stricture and stenosis of esophagus 03/24/2009   GERD (gastroesophageal reflux disease) 03/24/2009   Hereditary and idiopathic peripheral neuropathy 05/12/2008   Allergic rhinitis 05/12/2008   History of colonic polyps 05/12/2008   HLD (hyperlipidemia) 08/18/2007    PCP: Norleen Lynwood ORN, MD  REFERRING PROVIDER: Norleen Lynwood ORN, MD  REFERRING DIAG: R26.9 (ICD-10-CM) - Gait disorder  Rationale for Evaluation and Treatment: Rehabilitation  THERAPY DIAG:  Muscle weakness (generalized)  Other abnormalities of gait and mobility  ONSET DATE: gradually worsening over past several years  SUBJECTIVE:                                                                                                                                                                                          Per eval: Pt endorses history of peripheral neuropathy with gradually worsening weakness which is now affecting his walking. Feels his calves are weaker, can no longer do heel  raise. Feels his light touch is muted pretty significantly in both feet.  Used to be quite active, enjoyed playing basketball into his 32s. Enjoys going to gym but has been less active over last couple years.  Difficulty navigating hills, prolonged walking.   SUBJECTIVE STATEMENT: 06/24/2024: feeling good, no issues after  last session. States he feels like he can tell a bit of improvement in his calf strength. Also having less pain with walking. No other new updates.   PERTINENT HISTORY:  ascending aortic aneurysm, Afib RVR, hx chest pain, GERD, peripheral neuropathy, insomnia, CI, MRSA, OSA, TIA Describes Afib as stable, no pacemaker   PAIN:  Reports hx of chronic back pain - standing, walking, bending; states it is actually improving some w/ chiropractic care   PRECAUTIONS: fall risk, cardiac hx  RED FLAGS: None   WEIGHT BEARING RESTRICTIONS: No  FALLS:  Has patient fallen in last 6 months? No  LIVING ENVIRONMENT: 2 story home, bed/bath upstairs and main floor. 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   PATIENT SURVEYS:  PSFS:  - walking, 5  - heel raise, 0  - sit<>stand, 4  Total: 9; avg 3  COGNITION: Overall cognitive status: Within functional limits for tasks assessed     SENSATION: Light touch intact BIL LE aside from plantar aspect of BIL feet R>L per pt report   LOWER EXTREMITY MMT:    MMT Right eval Left eval  Hip flexion 4 4  Hip abduction (modified sitting) 5 5  Hip internal rotation    Hip external rotation    Knee flexion 4- 4  Knee  extension 5 5  Ankle dorsiflexion 5 5   (Blank rows = not tested) (Key: WFL = within functional limits not formally assessed, * = concordant pain, s = stiffness/stretching sensation, NT = not tested)  Comments:     FUNCTIONAL TESTS:  5xSTS: 24.67sec w/o UE support  06/10/24: Heel raise 2 cm difference less on R than L   GAIT:  FGA (no AD):  -Item 1 Gait Level Surface: mild impairment 2  -Item 2 Change in Gait Speed: moderate impairment 1   -Item 3 Gait with Horizontal Head Turns: mild impairment 2  -Item 4 Gait with Vertical Head Turns: Normal 3  -Item 5 Gait with Pivot Turn: moderate impairment 1   -Item 6 Step Over Obstacle: mild impairment 2  -Item 7 Gait with Narrow Base of Support: severe impairment 0 -Item 8 Gait with Eyes Closed: mild impairment 2             -Item 9 Ambulating Backwards: moderate impairment 1  -Item 10 Steps: Normal 3 (per pt report but does require inc time/effort) Total: 17 /30  * Score of <=22/30 indicates that patient is at increased risk for falls.   TREATMENT DATE:   [facility] Adult PT Treatment:                                                DATE: 06/24/24 Therapeutic Exercise: Seated 15# calf raise 2x12 BIL  Sidelying hip abd x15 BIL   Neuromuscular re-ed (CGA for safety throughout): 8inch fwd step ups no UE support x8 BIL cues for control 2# AW cone taps level ground, cone up x12 BIL 2#  AW cone taps level ground, cone inverted x12 BIL Airex cone taps x15 BIL Dynadisc step ups x12 BIL      [facility] Adult PT Treatment:                                                DATE: 06/19/2024  Therapeutic Exercise:  Seated heel raise x15 BIL  Seated hamstring curl green band 2x10, cues/education for home setup  Seated green band PF 2x15 BIL cues for setup  Neuromuscular re-ed: Tandem walk 3 laps along counter CGA  STS 2x8 emphasis on eccentric control  Forward Step up 8 step 2 x 10  Lateral Step Up 8 step 2 x 10  Cone taps 2 x 30 sec each   [facility]  Adult PT Treatment:                                                DATE: 06/10/24 Therapeutic Exercise: Seated heel raise x15 BIL  Seated hamstring curl green band 2x10, cues/education for home setup  Seated green band PF 2x15 BIL cues for setup HEP update + education/handout, education throughout on relevant anatomy/physiology and rationale for interventions  Neuromuscular re-ed: Tandem walk 3 laps along counter CGA  STS 2x8 emphasis on eccentric control  Step up + contralat LE hover on airex x10 BIL CGA    PATIENT EDUCATION:  Education details: rationale for interventions, HEP  Person educated: Patient Education method: Explanation, Demonstration, Tactile cues, Verbal cues Education comprehension: verbalized understanding, returned demonstration, verbal cues required, tactile cues required, and needs further education      HOME EXERCISE PROGRAM: Access Code: [email or web address] URL: [email or web address] Date: 06/10/2024 Prepared by: Alm Jenny  Exercises - Seated Heel Raise  - 2-3 x daily - 1 sets - 8-10 reps - Sit to Stand with Armchair  - 2-3 x daily - 1 sets - 5 reps - Standing Tandem Balance with Counter Support  - 2-3 x daily - 1 sets - 1-2 reps - 20-30sec hold - Seated Ankle Plantarflexion with Resistance  - 2-3 x daily - 1 sets - 15 reps - Seated Hamstring Curl with Anchored Resistance  - 2-3 x daily - 1 sets - 10 reps  ASSESSMENT:  CLINICAL IMPRESSION: 06/24/2024: Pt arrives w/ report of no issues after last session, continued improvement in gait/balance. He does a good job with today's progressions, does require cues for pacing, reduced compensatory momentum. CGA provided for safety w/ more challenging activities. Also progressing well for increased resistance/ROM with seated calf work. No adverse events, tolerates session quite well. Recommend continuing along current POC in order to address relevant deficits and improve functional tolerance. Pt departs today's  session in no acute distress, all voiced questions/concerns addressed appropriately from PT perspective.      Per eval - Patient is a pleasant 78 y.o. gentleman who was seen today for physical therapy evaluation and treatment for gait disorder. He endorses gradually worsening balance/weakness over last several years which he attributes to his peripheral neuropathy. On exam he demonstrates mild reductions in focal MMT, reduced functional strength and fall risk as evidenced by 5xSTS, and fall risk as evidenced by FGA. Demonstrates impairments in change of pace, dual tasking, and BOS challenges. No  adverse events, tolerates exam and HEP well overall. Recommend trial of skilled PT to address aforementioned deficits with aim of improving functional tolerance and reducing pain with typical activities. Pt departs today's session in no acute distress, all voiced concerns/questions addressed appropriately from PT perspective.      OBJECTIVE IMPAIRMENTS: Abnormal gait, decreased activity tolerance, decreased balance, decreased endurance, decreased mobility, difficulty walking, decreased strength, impaired perceived functional ability, and improper body mechanics.   ACTIVITY LIMITATIONS: carrying, lifting, bending, standing, stairs, transfers, and locomotion level  PARTICIPATION LIMITATIONS: meal prep, cleaning, laundry, community activity, and yard work  PERSONAL FACTORS: Age, Time since onset of injury/illness/exacerbation, and 3+ comorbidities: ascending aortic aneurysm, Afib RVR, hx chest pain, GERD, peripheral neuropathy, insomnia, CI, MRSA, OSA, TIA are also affecting patient's functional outcome.   REHAB POTENTIAL: Good  CLINICAL DECISION MAKING: Evolving/moderate complexity  EVALUATION COMPLEXITY: Moderate   GOALS:   SHORT TERM GOALS: Target date: 06/24/2024   Pt will demonstrate appropriate understanding and performance of initially prescribed HEP in order to facilitate improved independence  with management of symptoms.  Baseline: HEP established  06/24/24: reports good HEP performance Goal status: MET  2. Pt will report at least 5 sec improvement in 5xSTS in order to reduce fall risk. (MCID 2.3sec)  Baseline: 24 sec no UE support Goal status: ONGOING  LONG TERM GOALS: Target date: 07/15/2024   Pt will score avg 6 or greater on PSFS in order to demonstrate improved perception of functional status due to symptoms.  Baseline: 3 avg Goal status: INITIAL  2.  Pt will demonstrate LE MMT of at least 4+/5 in tested groups in order to facilitate improved functional strength.  Baseline: see MMT chart above  Goal status: INITIAL   3.  Pt will score greater than or equal to 22/30 on Functional Gait assessment in order to indicate reduced fall risk (cutoff score </= 22/30 predictive of falls per Willye et al 2010, MCID 4 pts Beninato et al 2014)  Baseline: 17/30  Goal status: INITIAL   4. Pt will perform 5xSTS in </=14 sec in order to demonstrate reduced fall risk and improved functional independence. (MCID of 2.3sec)  Baseline: 24 sec no UE support  Goal status: INITIAL   5. Pt will demonstrate appropriate performance of final prescribed HEP in order to facilitate improved self-management of symptoms post-discharge.   Baseline: initial HEP prescribed  Goal status: INITIAL     PLAN:  PT FREQUENCY: 1-2x/week  PT DURATION: 6 weeks  PLANNED INTERVENTIONS: [id number]- PT Re-evaluation, 97750- Physical Performance Testing, 97110-Therapeutic exercises, 97530- Therapeutic activity, 97112- Neuromuscular re-education, 97535- Self Care, [id number]- Manual therapy, [phone]- Gait training, Patient/Family education, Balance training, Stair training, Taping, Joint mobilization, Spinal mobilization, Cryotherapy, and Moist heat.  PLAN FOR NEXT SESSION: Review/update HEP PRN. Work on [employer] exercises as appropriate with emphasis on dual tasking, change of pace, functional strengthening. Symptom  modification strategies as indicated/appropriate, mindful of cardiac history.    Alm DELENA Jenny PT, DPT 06/24/2024 1:03 PM

## 2024-06-24 ENCOUNTER — Ambulatory Visit
Admission: RE | Admit: 2024-06-24 | Discharge: 2024-06-24 | Disposition: A | Discharge status: Home / Self Care | Source: Ambulatory Visit | Attending: Sports Medicine

## 2024-06-24 ENCOUNTER — Encounter: Payer: Self-pay | Admitting: Physical Therapy

## 2024-06-24 ENCOUNTER — Ambulatory Visit: Admitting: Physical Therapy

## 2024-06-24 DIAGNOSIS — M1852 Other unilateral secondary osteoarthritis of first carpometacarpal joint, left hand: Secondary | ICD-10-CM

## 2024-06-24 DIAGNOSIS — M6281 Muscle weakness (generalized): Secondary | ICD-10-CM | POA: Diagnosis not present

## 2024-06-24 DIAGNOSIS — M7702 Medial epicondylitis, left elbow: Secondary | ICD-10-CM

## 2024-06-24 DIAGNOSIS — R2689 Other abnormalities of gait and mobility: Secondary | ICD-10-CM

## 2024-06-24 DIAGNOSIS — M25532 Pain in left wrist: Secondary | ICD-10-CM

## 2024-06-24 DIAGNOSIS — M25522 Pain in left elbow: Secondary | ICD-10-CM

## 2024-06-24 DIAGNOSIS — G8929 Other chronic pain: Secondary | ICD-10-CM

## 2024-06-24 DIAGNOSIS — M51362 Other intervertebral disc degeneration, lumbar region with discogenic back pain and lower extremity pain: Secondary | ICD-10-CM

## 2024-06-27 ENCOUNTER — Ambulatory Visit: Admitting: Physical Therapy

## 2024-06-28 ENCOUNTER — Ambulatory Visit

## 2024-06-28 DIAGNOSIS — R2689 Other abnormalities of gait and mobility: Secondary | ICD-10-CM

## 2024-06-28 DIAGNOSIS — M6281 Muscle weakness (generalized): Secondary | ICD-10-CM | POA: Diagnosis not present

## 2024-06-28 NOTE — Therapy (Signed)
 OUTPATIENT PHYSICAL THERAPY TREATMENT   Patient Name: Dustin Berry. MRN: [id number] DOB:05/09/1946, 78 y.o., male Today's Date: 06/28/2024  END OF SESSION:  PT End of Session - 06/28/24 1006     Visit Number 6    Number of Visits 13    Date for PT Re-Evaluation 07/15/24    Authorization Type MDC    Progress Note Due on Visit 10    PT Start Time 1002    PT Stop Time 1040    PT Time Calculation (min) 38 min    Activity Tolerance Patient tolerated treatment well    Behavior During Therapy [facility] for tasks assessed/performed              Past Medical History:  Diagnosis Date   Abdominal pain 04/01/2013   Acute bronchitis 03/07/2018   Allergic rhinitis 05/12/2008   Aortic atherosclerosis 08/23/2020   Arthritis    Ascending aortic aneurysm 12/13/2018   Atrial fibrillation with RVR 10/21/2012   Echo- EF 50-55%; mild concentric LVH; flow pattern suggestive of impaired LV relaxation; mild mitral valve prolapse, trace mitral regurgitation   Back pain    receiving PT   Benign prostatic hyperplasia 09/05/2012   Boil of buttock 06/24/2011   Bradycardia 04/17/2013   Cataract    Cellulitis of knee, left 04/12/2016   Chest pain with exertion presumed to be tachycardia related along with SOB 04/01/2013   Degeneration of lumbar intervertebral disc 12/10/2020   Eustachian tube dysfunction, bilateral 03/07/2018   External otitis of left ear 06/11/2020   Fatigue 07/04/2011   GERD (gastroesophageal reflux disease) 03/24/2009   Heart murmur    Hematuria, possible 04/01/2013   Hereditary and idiopathic peripheral neuropathy 05/12/2008   HLD (hyperlipidemia) 08/18/2007   HTN (hypertension) 12/04/2013   Insomnia 06/08/2016   Left knee pain 12/13/2018   Low back pain 12/01/2020   Lumbar spondylosis 05/05/2021   Mild cognitive impairment of uncertain or unknown etiology 12/09/2022   Mitral valve prolapse 07/04/2011   MRSA (methicillin resistant staph aureus) culture positive 5+  years ago   Neck pain 05/05/2021   OSA (obstructive sleep apnea) 07/04/2011   Using CPAP nightly   Pain in right hand 02/04/2020   Palpitations 03/06/2007   R/P MV - mild perfusion defect in basal inferoseptal, basal inferior, mid inferoseptal, and mid inferior regions, consistent w/ infarct/scar; no scintigraphic evidence of inducible myocardial ischemia; prior non transmural infarct cannot be completely excluded; EF 48%; no significant change from previous study   Personal history of colonic polyps 05/12/2008   Radicular pain in left arm 05/05/2021   Sacroiliitis 05/05/2021   Stricture and stenosis of esophagus 03/24/2009   TIA (transient ischemic attack) 11/22/2021   Tinnitus 09/05/2012   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   Vitamin B12 deficiency 06/15/2019   Vitamin D  deficiency 05/23/2022   Past Surgical History:  Procedure Laterality Date   ATRIAL FIBRILLATION ABLATION     CARDIOVERSION N/A 04/03/2013   Procedure: CARDIOVERSION;  Surgeon: Vinie KYM Maxcy, MD;  Location: [facility] ENDOSCOPY;  Service: Cardiovascular;  Laterality: N/A;   COLONOSCOPY  2018   HAND SURGERY     Thumb joint repair   POLYPECTOMY     TEE WITHOUT CARDIOVERSION N/A 04/03/2013   Procedure: TRANSESOPHAGEAL ECHOCARDIOGRAM (TEE);  Surgeon: Vinie KYM Maxcy, MD;  Location: [facility] ENDOSCOPY;  Service: Cardiovascular;  Laterality: N/A;   TONSILLECTOMY AND ADENOIDECTOMY     UPPER GASTROINTESTINAL ENDOSCOPY     dilation   Patient Active Problem List   Diagnosis  Date Noted   Left lateral epicondylitis 05/25/2024   Gait disorder 05/25/2024   Arthritis of left wrist 03/04/2024   Encounter for orthopedic follow-up care 03/04/2024   Right upper quadrant abdominal pain 01/27/2024   Alzheimer dementia (HCC) 12/26/2023   Mild cognitive impairment of uncertain or unknown etiology 12/09/2022   Vitamin D  deficiency 05/23/2022   TIA (transient ischemic attack) 11/22/2021   Aortic root aneurysm 07/30/2021   Lumbar spondylosis  05/05/2021   Neck pain 05/05/2021   Radicular pain in left arm 05/05/2021   Sacroiliitis 05/05/2021   Degeneration of lumbar intervertebral disc 12/10/2020   Low back pain 12/01/2020   Pain of right hip joint 10/22/2020   Aortic atherosclerosis 08/23/2020   External otitis of left ear 06/11/2020   Pain in right hand 02/04/2020   Vitamin B12 deficiency 06/15/2019   Weight loss 12/13/2018   Left knee pain 12/13/2018   Ascending aortic aneurysm 12/13/2018   Eustachian tube dysfunction, bilateral 03/07/2018   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   Insomnia 06/08/2016   Cellulitis of knee, left 04/12/2016   HTN (hypertension) 12/04/2013   Bradycardia 04/17/2013   Atrial fibrillation with RVR 04/01/2013   Chest pain with exertion presumed to be tachycardia related along with SOB 04/01/2013   Hematuria 04/01/2013   Benign prostatic hyperplasia 09/05/2012   Tinnitus 09/05/2012   OSA (obstructive sleep apnea) 07/04/2011   Mitral valve prolapse 07/04/2011   Fatigue 07/04/2011   Stricture and stenosis of esophagus 03/24/2009   GERD (gastroesophageal reflux disease) 03/24/2009   Hereditary and idiopathic peripheral neuropathy 05/12/2008   Allergic rhinitis 05/12/2008   History of colonic polyps 05/12/2008   HLD (hyperlipidemia) 08/18/2007    PCP: Norleen Lynwood ORN, MD  REFERRING PROVIDER: Norleen Lynwood ORN, MD  REFERRING DIAG: R26.9 (ICD-10-CM) - Gait disorder  Rationale for Evaluation and Treatment: Rehabilitation  THERAPY DIAG:  Muscle weakness (generalized)  Other abnormalities of gait and mobility  ONSET DATE: gradually worsening over past several years  SUBJECTIVE:                                                                                                                                                                                          Per eval: Pt endorses history of peripheral neuropathy with gradually worsening weakness which is now affecting his walking. Feels his calves  are weaker, can no longer do heel raise. Feels his light touch is muted pretty significantly in both feet.  Used to be quite active, enjoyed playing basketball into his 39s. Enjoys going to gym but has been less active over last couple years.  Difficulty navigating hills, prolonged walking.   SUBJECTIVE  STATEMENT: 06/28/2024: feeling good, no issues after last session.  PERTINENT HISTORY:  ascending aortic aneurysm, Afib RVR, hx chest pain, GERD, peripheral neuropathy, insomnia, CI, MRSA, OSA, TIA Describes Afib as stable, no pacemaker   PAIN:  Reports hx of chronic back pain - standing, walking, bending; states it is actually improving some w/ chiropractic care   PRECAUTIONS: fall risk, cardiac hx  RED FLAGS: None   WEIGHT BEARING RESTRICTIONS: No  FALLS:  Has patient fallen in last 6 months? No  LIVING ENVIRONMENT: 2 story home, bed/bath upstairs and main floor. 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   PATIENT SURVEYS:  PSFS:  - walking, 5  - heel raise, 0  - sit<>stand, 4  Total: 9; avg 3  COGNITION: Overall cognitive status: Within functional limits for tasks assessed     SENSATION: Light touch intact BIL LE aside from plantar aspect of BIL feet R>L per pt report   LOWER EXTREMITY MMT:    MMT Right eval Left eval  Hip flexion 4 4  Hip abduction (modified sitting) 5 5  Hip internal rotation    Hip external rotation    Knee flexion 4- 4  Knee extension 5 5  Ankle dorsiflexion 5 5   (Blank rows = not tested) (Key: WFL = within functional  limits not formally assessed, * = concordant pain, s = stiffness/stretching sensation, NT = not tested)  Comments:     FUNCTIONAL TESTS:  5xSTS: 24.67sec w/o UE support  06/10/24: Heel raise 2 cm difference less on R than L   GAIT:  FGA (no AD):  -Item 1 Gait Level Surface: mild impairment 2  -Item 2 Change in Gait Speed: moderate impairment 1   -Item 3 Gait with Horizontal Head Turns: mild impairment 2  -Item 4 Gait with Vertical Head Turns: Normal 3  -Item 5 Gait with Pivot Turn: moderate impairment 1   -Item 6 Step Over Obstacle: mild impairment 2  -Item 7 Gait with Narrow Base of Support: severe impairment 0 -Item 8 Gait with Eyes Closed: mild impairment 2             -Item 9 Ambulating Backwards: moderate impairment 1  -Item 10 Steps: Normal 3 (per pt report but does require inc time/effort) Total: 17 /30  * Score of <=22/30 indicates that patient is at increased risk for falls.   TREATMENT DATE:   [facility] Adult PT Treatment:                                                DATE: 06/28/2024  Therapeutic Exercise: Seated 15# calf raise 2x12 BIL  Sidelying hip abd 2x10 BIL, 2lb AW  Neuromuscular re-ed (CGA for safety throughout): 2lb AW utilized throughout session.  8inch fwd step ups no UE support x10 BIL cues for control Lateral 8inch fwd step ups x 10 BIL  Airex cone taps level ground, cone inverted 3 x 30 sec, varied cone  order per clinician  Dynadisc step ups x10 BIL     PATIENT EDUCATION:  Education details: rationale for interventions, HEP  Person educated: Patient Education method: Explanation, Demonstration, Tactile cues, Verbal cues Education comprehension: verbalized understanding, returned demonstration, verbal cues required, tactile cues required, and needs further education      HOME EXERCISE PROGRAM: Access Code: [email or web address] URL: [email or web address] Date: 06/10/2024 Prepared by: Alm Jenny  Exercises - Seated Heel Raise  - 2-3 x daily  - 1 sets - 8-10 reps - Sit to Stand with Armchair  - 2-3 x daily - 1 sets - 5 reps - Standing Tandem Balance with Counter Support  - 2-3 x daily - 1 sets - 1-2 reps - 20-30sec hold - Seated Ankle Plantarflexion with Resistance  - 2-3 x daily - 1 sets - 15 reps - Seated Hamstring Curl with Anchored Resistance  - 2-3 x daily - 1 sets - 10 reps  ASSESSMENT:  CLINICAL IMPRESSION: 06/28/2024: Dale had good tolerance of today's treatment session, which focused on LE. Patient had some difficulty with navigating uneven surfaces. We will continue to progress per POC as tolerated, in order to reach established rehab goals.     Per eval - Patient is a pleasant 78 y.o. gentleman who was seen today for physical therapy evaluation and treatment for gait disorder. He endorses gradually worsening balance/weakness over last several years which he attributes to his peripheral neuropathy. On exam he demonstrates mild reductions in focal MMT, reduced functional strength and fall risk as evidenced by 5xSTS, and fall risk as evidenced by FGA. Demonstrates impairments in change of pace, dual tasking, and BOS challenges. No adverse events, tolerates exam and HEP well overall. Recommend trial of skilled PT to address aforementioned deficits with aim of improving functional tolerance and reducing pain with typical activities. Pt departs today's session in no acute distress, all voiced concerns/questions addressed appropriately from PT perspective.      OBJECTIVE IMPAIRMENTS: Abnormal gait, decreased activity tolerance, decreased balance, decreased endurance, decreased mobility, difficulty walking, decreased strength, impaired perceived functional ability, and improper body mechanics.   ACTIVITY LIMITATIONS: carrying, lifting, bending, standing, stairs, transfers, and locomotion level  PARTICIPATION LIMITATIONS: meal prep, cleaning, laundry, community activity, and yard work  PERSONAL FACTORS: Age, Time since onset of  injury/illness/exacerbation, and 3+ comorbidities: ascending aortic aneurysm, Afib RVR, hx chest pain, GERD, peripheral neuropathy, insomnia, CI, MRSA, OSA, TIA are also affecting patient's functional outcome.   REHAB POTENTIAL: Good  CLINICAL DECISION MAKING: Evolving/moderate complexity  EVALUATION COMPLEXITY: Moderate   GOALS:   SHORT TERM GOALS: Target date: 06/24/2024   Pt will demonstrate appropriate understanding and performance of initially prescribed HEP in order to facilitate improved independence with management of symptoms.  Baseline: HEP established  06/24/24: reports good HEP performance Goal status: MET  2. Pt will report at least 5 sec improvement in 5xSTS in order to reduce fall risk. (MCID 2.3sec)  Baseline: 24 sec no UE support Goal status: ONGOING  LONG TERM GOALS: Target date: 07/15/2024   Pt will score avg 6 or greater on PSFS in order to demonstrate improved perception of functional status due to symptoms.  Baseline: 3 avg Goal status: INITIAL  2.  Pt will demonstrate LE MMT of at least 4+/5 in tested groups in order to facilitate improved functional strength.  Baseline: see MMT chart above  Goal status: INITIAL   3.  Pt will score greater than or equal to 22/30 on Functional Gait assessment in order to indicate  reduced fall risk (cutoff score </= 22/30 predictive of falls per Willye et al 2010, MCID 4 pts Beninato et al 2014)  Baseline: 17/30  Goal status: INITIAL   4. Pt will perform 5xSTS in </=14 sec in order to demonstrate reduced fall risk and improved functional independence. (MCID of 2.3sec)  Baseline: 24 sec no UE support  Goal status: INITIAL   5. Pt will demonstrate appropriate performance of final prescribed HEP in order to facilitate improved self-management of symptoms post-discharge.   Baseline: initial HEP prescribed  Goal status: INITIAL     PLAN:  PT FREQUENCY: 1-2x/week  PT DURATION: 6 weeks  PLANNED INTERVENTIONS: [id number]- PT  Re-evaluation, [id number]- Physical Performance Testing, 97110-Therapeutic exercises, 97530- Therapeutic activity, 97112- Neuromuscular re-education, 97535- Self Care, [id number]- Manual therapy, [phone]- Gait training, Patient/Family education, Balance training, Stair training, Taping, Joint mobilization, Spinal mobilization, Cryotherapy, and Moist heat.  PLAN FOR NEXT SESSION: Review/update HEP PRN. Work on [employer] exercises as appropriate with emphasis on dual tasking, change of pace, functional strengthening. Symptom modification strategies as indicated/appropriate, mindful of cardiac history.    Marko Molt, PT, DPT  06/28/2024 12:10 PM

## 2024-07-01 ENCOUNTER — Ambulatory Visit: Admitting: Physical Therapy

## 2024-07-01 ENCOUNTER — Encounter: Payer: Self-pay | Admitting: Physical Therapy

## 2024-07-01 DIAGNOSIS — M6281 Muscle weakness (generalized): Secondary | ICD-10-CM

## 2024-07-01 DIAGNOSIS — R2689 Other abnormalities of gait and mobility: Secondary | ICD-10-CM

## 2024-07-01 NOTE — Therapy (Signed)
 OUTPATIENT PHYSICAL THERAPY TREATMENT   Patient Name: Dustin Berry. MRN: [id number] DOB:28-Jun-1946, 78 y.o., male Today's Date: 07/01/2024  END OF SESSION:  PT End of Session - 07/01/24 1219     Visit Number 7    Number of Visits 13    Date for PT Re-Evaluation 07/15/24    Authorization Type MDC    Progress Note Due on Visit 10    PT Start Time 1219    PT Stop Time 1302    PT Time Calculation (min) 43 min               Past Medical History:  Diagnosis Date   Abdominal pain 04/01/2013   Acute bronchitis 03/07/2018   Allergic rhinitis 05/12/2008   Aortic atherosclerosis 08/23/2020   Arthritis    Ascending aortic aneurysm 12/13/2018   Atrial fibrillation with RVR 10/21/2012   Echo- EF 50-55%; mild concentric LVH; flow pattern suggestive of impaired LV relaxation; mild mitral valve prolapse, trace mitral regurgitation   Back pain    receiving PT   Benign prostatic hyperplasia 09/05/2012   Boil of buttock 06/24/2011   Bradycardia 04/17/2013   Cataract    Cellulitis of knee, left 04/12/2016   Chest pain with exertion presumed to be tachycardia related along with SOB 04/01/2013   Degeneration of lumbar intervertebral disc 12/10/2020   Eustachian tube dysfunction, bilateral 03/07/2018   External otitis of left ear 06/11/2020   Fatigue 07/04/2011   GERD (gastroesophageal reflux disease) 03/24/2009   Heart murmur    Hematuria, possible 04/01/2013   Hereditary and idiopathic peripheral neuropathy 05/12/2008   HLD (hyperlipidemia) 08/18/2007   HTN (hypertension) 12/04/2013   Insomnia 06/08/2016   Left knee pain 12/13/2018   Low back pain 12/01/2020   Lumbar spondylosis 05/05/2021   Mild cognitive impairment of uncertain or unknown etiology 12/09/2022   Mitral valve prolapse 07/04/2011   MRSA (methicillin resistant staph aureus) culture positive 5+ years ago   Neck pain 05/05/2021   OSA (obstructive sleep apnea) 07/04/2011   Using CPAP nightly   Pain in  right hand 02/04/2020   Palpitations 03/06/2007   R/P MV - mild perfusion defect in basal inferoseptal, basal inferior, mid inferoseptal, and mid inferior regions, consistent w/ infarct/scar; no scintigraphic evidence of inducible myocardial ischemia; prior non transmural infarct cannot be completely excluded; EF 48%; no significant change from previous study   Personal history of colonic polyps 05/12/2008   Radicular pain in left arm 05/05/2021   Sacroiliitis 05/05/2021   Stricture and stenosis of esophagus 03/24/2009   TIA (transient ischemic attack) 11/22/2021   Tinnitus 09/05/2012   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   Vitamin B12 deficiency 06/15/2019   Vitamin D  deficiency 05/23/2022   Past Surgical History:  Procedure Laterality Date   ATRIAL FIBRILLATION ABLATION     CARDIOVERSION N/A 04/03/2013   Procedure: CARDIOVERSION;  Surgeon: Vinie KYM Maxcy, MD;  Location: [facility] ENDOSCOPY;  Service: Cardiovascular;  Laterality: N/A;   COLONOSCOPY  2018   HAND SURGERY     Thumb joint repair   POLYPECTOMY     TEE WITHOUT CARDIOVERSION N/A 04/03/2013   Procedure: TRANSESOPHAGEAL ECHOCARDIOGRAM (TEE);  Surgeon: Vinie KYM Maxcy, MD;  Location: [facility] ENDOSCOPY;  Service: Cardiovascular;  Laterality: N/A;   TONSILLECTOMY AND ADENOIDECTOMY     UPPER GASTROINTESTINAL ENDOSCOPY     dilation   Patient Active Problem List   Diagnosis Date Noted   Left lateral epicondylitis 05/25/2024   Gait disorder 05/25/2024   Arthritis of left  wrist 03/04/2024   Encounter for orthopedic follow-up care 03/04/2024   Right upper quadrant abdominal pain 01/27/2024   Alzheimer dementia (HCC) 12/26/2023   Mild cognitive impairment of uncertain or unknown etiology 12/09/2022   Vitamin D  deficiency 05/23/2022   TIA (transient ischemic attack) 11/22/2021   Aortic root aneurysm 07/30/2021   Lumbar spondylosis 05/05/2021   Neck pain 05/05/2021   Radicular pain in left arm 05/05/2021   Sacroiliitis 05/05/2021    Degeneration of lumbar intervertebral disc 12/10/2020   Low back pain 12/01/2020   Pain of right hip joint 10/22/2020   Aortic atherosclerosis 08/23/2020   External otitis of left ear 06/11/2020   Pain in right hand 02/04/2020   Vitamin B12 deficiency 06/15/2019   Weight loss 12/13/2018   Left knee pain 12/13/2018   Ascending aortic aneurysm 12/13/2018   Eustachian tube dysfunction, bilateral 03/07/2018   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   Insomnia 06/08/2016   Cellulitis of knee, left 04/12/2016   HTN (hypertension) 12/04/2013   Bradycardia 04/17/2013   Atrial fibrillation with RVR 04/01/2013   Chest pain with exertion presumed to be tachycardia related along with SOB 04/01/2013   Hematuria 04/01/2013   Benign prostatic hyperplasia 09/05/2012   Tinnitus 09/05/2012   OSA (obstructive sleep apnea) 07/04/2011   Mitral valve prolapse 07/04/2011   Fatigue 07/04/2011   Stricture and stenosis of esophagus 03/24/2009   GERD (gastroesophageal reflux disease) 03/24/2009   Hereditary and idiopathic peripheral neuropathy 05/12/2008   Allergic rhinitis 05/12/2008   History of colonic polyps 05/12/2008   HLD (hyperlipidemia) 08/18/2007    PCP: Norleen Lynwood ORN, MD  REFERRING PROVIDER: Norleen Lynwood ORN, MD  REFERRING DIAG: R26.9 (ICD-10-CM) - Gait disorder  Rationale for Evaluation and Treatment: Rehabilitation  THERAPY DIAG:  Muscle weakness (generalized)  Other abnormalities of gait and mobility  ONSET DATE: gradually worsening over past several years  SUBJECTIVE:                                                                                                                                                                                          Per eval: Pt endorses history of peripheral neuropathy with gradually worsening weakness which is now affecting his walking. Feels his calves are weaker, can no longer do heel raise. Feels his light touch is muted pretty significantly in both feet.   Used to be quite active, enjoyed playing basketball into his 77s. Enjoys going to gym but has been less active over last couple years.  Difficulty navigating hills, prolonged walking.   SUBJECTIVE STATEMENT: 07/01/2024: felt good after last session. States his back continues to bother him but about the same  as usual. No other new updates, feels like walking is requiring less effort.   PERTINENT HISTORY:  ascending aortic aneurysm, Afib RVR, hx chest pain, GERD, peripheral neuropathy, insomnia, CI, MRSA, OSA, TIA Describes Afib as stable, no pacemaker   PAIN:  Reports hx of chronic back pain - standing, walking, bending; states it is actually improving some w/ chiropractic care   PRECAUTIONS: fall risk, cardiac hx  RED FLAGS: None   WEIGHT BEARING RESTRICTIONS: No  FALLS:  Has patient fallen in last 6 months? No  LIVING ENVIRONMENT: 2 story home, bed/bath upstairs and main floor. 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   PATIENT SURVEYS:  PSFS:  - walking, 5  - heel raise, 0  - sit<>stand, 4  Total: 9; avg 3  COGNITION: Overall cognitive status: Within functional limits for tasks assessed     SENSATION: Light touch intact BIL LE aside from plantar aspect of BIL feet R>L per pt report   LOWER EXTREMITY MMT:    MMT Right eval Left eval  Hip flexion 4 4  Hip abduction (modified sitting) 5 5  Hip internal rotation    Hip external rotation    Knee flexion 4- 4  Knee extension 5 5  Ankle dorsiflexion 5 5   (Blank rows = not tested) (Key: WFL =  within functional limits not formally assessed, * = concordant pain, s = stiffness/stretching sensation, NT = not tested)  Comments:     FUNCTIONAL TESTS:  5xSTS: 24.67sec w/o UE support  06/10/24: Heel raise 2 cm difference less on R than L   GAIT:  FGA (no AD):  -Item 1 Gait Level Surface: mild impairment 2  -Item 2 Change in Gait Speed: moderate impairment 1   -Item 3 Gait with Horizontal Head Turns: mild impairment 2  -Item 4 Gait with Vertical Head Turns: Normal 3  -Item 5 Gait with Pivot Turn: moderate impairment 1   -Item 6 Step Over Obstacle: mild impairment 2  -Item 7 Gait with Narrow Base of Support: severe impairment 0 -Item 8 Gait with Eyes Closed: mild impairment 2             -Item 9 Ambulating Backwards: moderate impairment 1  -Item 10 Steps: Normal 3 (per pt report but does require inc time/effort) Total: 17 /30  * Score of <=22/30 indicates that patient is at increased risk for falls.   TREATMENT DATE:   [facility] Adult PT Treatment:                                                DATE: 07/01/24 Therapeutic Exercise: Seated 15# 3-3-3 heel raise, x12 BIL  Standing BW heel raise 2x8 at counter Standing red band (figure 8) march x12 BIL   Neuromuscular re-ed: Cone weaving slow/fast 1 lap Red light/green light 3 laps  Agility ladder 2 in 2 out diagonals 2 laps cues for sequencing/velocity Lateral stepping red band at counter (figure  8 above knee), 5 laps cues for quick steps and posture Fwd/retro stepping 5 laps red band figure 8 cues for posture    [facility] Adult PT Treatment:                                                DATE: 06/28/2024  Therapeutic Exercise: Seated 15# calf raise 2x12 BIL  Sidelying hip abd 2x10 BIL, 2lb AW  Neuromuscular re-ed (CGA for safety throughout): 2lb AW utilized throughout session.  8inch fwd step ups no UE support x10 BIL cues for control Lateral 8inch fwd step ups x 10 BIL  Airex cone taps level ground, cone inverted 3 x 30 sec,  varied cone order per clinician  Dynadisc step ups x10 BIL     PATIENT EDUCATION:  Education details: rationale for interventions, HEP  Person educated: Patient Education method: Explanation, Demonstration, Tactile cues, Verbal cues Education comprehension: verbalized understanding, returned demonstration, verbal cues required, tactile cues required, and needs further education      HOME EXERCISE PROGRAM: Access Code: [email or web address] URL: [email or web address] Date: 06/10/2024 Prepared by: Alm Jenny  Exercises - Seated Heel Raise  - 2-3 x daily - 1 sets - 8-10 reps - Sit to Stand with Armchair  - 2-3 x daily - 1 sets - 5 reps - Standing Tandem Balance with Counter Support  - 2-3 x daily - 1 sets - 1-2 reps - 20-30sec hold - Seated Ankle Plantarflexion with Resistance  - 2-3 x daily - 1 sets - 15 reps - Seated Hamstring Curl with Anchored Resistance  - 2-3 x daily - 1 sets - 10 reps  ASSESSMENT:  CLINICAL IMPRESSION: 07/01/2024: Pt arrives w/ report of gradually improving balance. Today does well with progressions for LE strengthening and postural stability, cues as above. Of note, he demonstrates significantly improved symmetry w/ BW heel raises, although does fatigue quickly and reports subjective weakness. Tolerates session well without increase in pain, no adverse events. Recommend continuing along current POC in order to address relevant deficits and improve functional tolerance. Pt departs today's session in no acute distress, all voiced questions/concerns addressed appropriately from PT perspective.     Per eval - Patient is a pleasant 78 y.o. gentleman who was seen today for physical therapy evaluation and treatment for gait disorder. He endorses gradually worsening balance/weakness over last several years which he attributes to his peripheral neuropathy. On exam he demonstrates mild reductions in focal MMT, reduced functional strength and fall risk as evidenced by  5xSTS, and fall risk as evidenced by FGA. Demonstrates impairments in change of pace, dual tasking, and BOS challenges. No adverse events, tolerates exam and HEP well overall. Recommend trial of skilled PT to address aforementioned deficits with aim of improving functional tolerance and reducing pain with typical activities. Pt departs today's session in no acute distress, all voiced concerns/questions addressed appropriately from PT perspective.      OBJECTIVE IMPAIRMENTS: Abnormal gait, decreased activity tolerance, decreased balance, decreased endurance, decreased mobility, difficulty walking, decreased strength, impaired perceived functional ability, and improper body mechanics.   ACTIVITY LIMITATIONS: carrying, lifting, bending, standing, stairs, transfers, and locomotion level  PARTICIPATION LIMITATIONS: meal prep, cleaning, laundry, community activity, and yard work  PERSONAL FACTORS: Age, Time since onset of injury/illness/exacerbation, and 3+ comorbidities: ascending aortic aneurysm, Afib RVR, hx chest pain, GERD, peripheral neuropathy, insomnia, CI, MRSA, OSA, TIA  are also affecting patient's functional outcome.   REHAB POTENTIAL: Good  CLINICAL DECISION MAKING: Evolving/moderate complexity  EVALUATION COMPLEXITY: Moderate   GOALS:   SHORT TERM GOALS: Target date: 06/24/2024   Pt will demonstrate appropriate understanding and performance of initially prescribed HEP in order to facilitate improved independence with management of symptoms.  Baseline: HEP established  06/24/24: reports good HEP performance Goal status: MET  2. Pt will report at least 5 sec improvement in 5xSTS in order to reduce fall risk. (MCID 2.3sec)  Baseline: 24 sec no UE support Goal status: ONGOING  LONG TERM GOALS: Target date: 07/15/2024   Pt will score avg 6 or greater on PSFS in order to demonstrate improved perception of functional status due to symptoms.  Baseline: 3 avg Goal status: INITIAL  2.   Pt will demonstrate LE MMT of at least 4+/5 in tested groups in order to facilitate improved functional strength.  Baseline: see MMT chart above  Goal status: INITIAL   3.  Pt will score greater than or equal to 22/30 on Functional Gait assessment in order to indicate reduced fall risk (cutoff score </= 22/30 predictive of falls per Willye et al 2010, MCID 4 pts Beninato et al 2014)  Baseline: 17/30  Goal status: INITIAL   4. Pt will perform 5xSTS in </=14 sec in order to demonstrate reduced fall risk and improved functional independence. (MCID of 2.3sec)  Baseline: 24 sec no UE support  Goal status: INITIAL   5. Pt will demonstrate appropriate performance of final prescribed HEP in order to facilitate improved self-management of symptoms post-discharge.   Baseline: initial HEP prescribed  Goal status: INITIAL     PLAN:  PT FREQUENCY: 1-2x/week  PT DURATION: 6 weeks  PLANNED INTERVENTIONS: [id number]- PT Re-evaluation, 97750- Physical Performance Testing, 97110-Therapeutic exercises, 97530- Therapeutic activity, 97112- Neuromuscular re-education, 97535- Self Care, [id number]- Manual therapy, [phone]- Gait training, Patient/Family education, Balance training, Stair training, Taping, Joint mobilization, Spinal mobilization, Cryotherapy, and Moist heat.  PLAN FOR NEXT SESSION: Review/update HEP PRN. Work on [employer] exercises as appropriate with emphasis on dual tasking, change of pace, functional strengthening. Symptom modification strategies as indicated/appropriate, mindful of cardiac history.    Alm DELENA Jenny PT, DPT 07/01/2024 1:07 PM

## 2024-07-02 NOTE — Therapy (Signed)
 OUTPATIENT PHYSICAL THERAPY TREATMENT   Patient Name: Dustin Berry. MRN: [id number] DOB:Feb 02, 1946, 78 y.o., male Today's Date: 07/03/2024  END OF SESSION:  PT End of Session - 07/03/24 1213     Visit Number 8    Number of Visits 13    Date for PT Re-Evaluation 07/15/24    Authorization Type MDC    Progress Note Due on Visit 10    PT Start Time 1214    PT Stop Time 1255    PT Time Calculation (min) 41 min                Past Medical History:  Diagnosis Date   Abdominal pain 04/01/2013   Acute bronchitis 03/07/2018   Allergic rhinitis 05/12/2008   Aortic atherosclerosis 08/23/2020   Arthritis    Ascending aortic aneurysm 12/13/2018   Atrial fibrillation with RVR 10/21/2012   Echo- EF 50-55%; mild concentric LVH; flow pattern suggestive of impaired LV relaxation; mild mitral valve prolapse, trace mitral regurgitation   Back pain    receiving PT   Benign prostatic hyperplasia 09/05/2012   Boil of buttock 06/24/2011   Bradycardia 04/17/2013   Cataract    Cellulitis of knee, left 04/12/2016   Chest pain with exertion presumed to be tachycardia related along with SOB 04/01/2013   Degeneration of lumbar intervertebral disc 12/10/2020   Eustachian tube dysfunction, bilateral 03/07/2018   External otitis of left ear 06/11/2020   Fatigue 07/04/2011   GERD (gastroesophageal reflux disease) 03/24/2009   Heart murmur    Hematuria, possible 04/01/2013   Hereditary and idiopathic peripheral neuropathy 05/12/2008   HLD (hyperlipidemia) 08/18/2007   HTN (hypertension) 12/04/2013   Insomnia 06/08/2016   Left knee pain 12/13/2018   Low back pain 12/01/2020   Lumbar spondylosis 05/05/2021   Mild cognitive impairment of uncertain or unknown etiology 12/09/2022   Mitral valve prolapse 07/04/2011   MRSA (methicillin resistant staph aureus) culture positive 5+ years ago   Neck pain 05/05/2021   OSA (obstructive sleep apnea) 07/04/2011   Using CPAP nightly   Pain in  right hand 02/04/2020   Palpitations 03/06/2007   R/P MV - mild perfusion defect in basal inferoseptal, basal inferior, mid inferoseptal, and mid inferior regions, consistent w/ infarct/scar; no scintigraphic evidence of inducible myocardial ischemia; prior non transmural infarct cannot be completely excluded; EF 48%; no significant change from previous study   Personal history of colonic polyps 05/12/2008   Radicular pain in left arm 05/05/2021   Sacroiliitis 05/05/2021   Stricture and stenosis of esophagus 03/24/2009   TIA (transient ischemic attack) 11/22/2021   Tinnitus 09/05/2012   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   Vitamin B12 deficiency 06/15/2019   Vitamin D  deficiency 05/23/2022   Past Surgical History:  Procedure Laterality Date   ATRIAL FIBRILLATION ABLATION     CARDIOVERSION N/A 04/03/2013   Procedure: CARDIOVERSION;  Surgeon: Vinie KYM Maxcy, MD;  Location: [facility] ENDOSCOPY;  Service: Cardiovascular;  Laterality: N/A;   COLONOSCOPY  2018   HAND SURGERY     Thumb joint repair   POLYPECTOMY     TEE WITHOUT CARDIOVERSION N/A 04/03/2013   Procedure: TRANSESOPHAGEAL ECHOCARDIOGRAM (TEE);  Surgeon: Vinie KYM Maxcy, MD;  Location: [facility] ENDOSCOPY;  Service: Cardiovascular;  Laterality: N/A;   TONSILLECTOMY AND ADENOIDECTOMY     UPPER GASTROINTESTINAL ENDOSCOPY     dilation   Patient Active Problem List   Diagnosis Date Noted   Left lateral epicondylitis 05/25/2024   Gait disorder 05/25/2024   Arthritis of  left wrist 03/04/2024   Encounter for orthopedic follow-up care 03/04/2024   Right upper quadrant abdominal pain 01/27/2024   Alzheimer dementia (HCC) 12/26/2023   Mild cognitive impairment of uncertain or unknown etiology 12/09/2022   Vitamin D  deficiency 05/23/2022   TIA (transient ischemic attack) 11/22/2021   Aortic root aneurysm 07/30/2021   Lumbar spondylosis 05/05/2021   Neck pain 05/05/2021   Radicular pain in left arm 05/05/2021   Sacroiliitis 05/05/2021    Degeneration of lumbar intervertebral disc 12/10/2020   Low back pain 12/01/2020   Pain of right hip joint 10/22/2020   Aortic atherosclerosis 08/23/2020   External otitis of left ear 06/11/2020   Pain in right hand 02/04/2020   Vitamin B12 deficiency 06/15/2019   Weight loss 12/13/2018   Left knee pain 12/13/2018   Ascending aortic aneurysm 12/13/2018   Eustachian tube dysfunction, bilateral 03/07/2018   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   Insomnia 06/08/2016   Cellulitis of knee, left 04/12/2016   HTN (hypertension) 12/04/2013   Bradycardia 04/17/2013   Atrial fibrillation with RVR 04/01/2013   Chest pain with exertion presumed to be tachycardia related along with SOB 04/01/2013   Hematuria 04/01/2013   Benign prostatic hyperplasia 09/05/2012   Tinnitus 09/05/2012   OSA (obstructive sleep apnea) 07/04/2011   Mitral valve prolapse 07/04/2011   Fatigue 07/04/2011   Stricture and stenosis of esophagus 03/24/2009   GERD (gastroesophageal reflux disease) 03/24/2009   Hereditary and idiopathic peripheral neuropathy 05/12/2008   Allergic rhinitis 05/12/2008   History of colonic polyps 05/12/2008   HLD (hyperlipidemia) 08/18/2007    PCP: Norleen Lynwood ORN, MD  REFERRING PROVIDER: Norleen Lynwood ORN, MD  REFERRING DIAG: R26.9 (ICD-10-CM) - Gait disorder  Rationale for Evaluation and Treatment: Rehabilitation  THERAPY DIAG:  Muscle weakness (generalized)  Other abnormalities of gait and mobility  ONSET DATE: gradually worsening over past several years  SUBJECTIVE:                                                                                                                                                                                          Per eval: Pt endorses history of peripheral neuropathy with gradually worsening weakness which is now affecting his walking. Feels his calves are weaker, can no longer do heel raise. Feels his light touch is muted pretty significantly in both feet.   Used to be quite active, enjoyed playing basketball into his 23s. Enjoys going to gym but has been less active over last couple years.  Difficulty navigating hills, prolonged walking.   SUBJECTIVE STATEMENT: 07/03/2024: arrives w/ report of baseline back pain, no other new issues. States balance seems to  be improving, as does calf strength.  PERTINENT HISTORY:  ascending aortic aneurysm, Afib RVR, hx chest pain, GERD, peripheral neuropathy, insomnia, CI, MRSA, OSA, TIA Describes Afib as stable, no pacemaker   PAIN:  Reports hx of chronic back pain - standing, walking, bending; states it is actually improving some w/ chiropractic care   PRECAUTIONS: fall risk, cardiac hx  RED FLAGS: None   WEIGHT BEARING RESTRICTIONS: No  FALLS:  Has patient fallen in last 6 months? No  LIVING ENVIRONMENT: 2 story home, bed/bath upstairs and main floor. 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   PATIENT SURVEYS:  PSFS:  - walking, 5  - heel raise, 0  - sit<>stand, 4  Total: 9; avg 3  COGNITION: Overall cognitive status: Within functional limits for tasks assessed     SENSATION: Light touch intact BIL LE aside from plantar aspect of BIL feet R>L per pt report   LOWER EXTREMITY MMT:    MMT Right eval Left eval  Hip flexion 4 4  Hip abduction (modified sitting) 5 5  Hip internal rotation    Hip external rotation    Knee flexion 4- 4  Knee extension 5 5  Ankle dorsiflexion 5 5   (Blank rows = not tested) (Key: WFL = within functional limits not formally assessed,  * = concordant pain, s = stiffness/stretching sensation, NT = not tested)  Comments:     FUNCTIONAL TESTS:  5xSTS: 24.67sec w/o UE support   - 07/03/24 5xSTS 11.07 sec w/o UE support (1 instance of posterior LOB on ascent)   06/10/24: Heel raise 2 cm difference less on R than L   GAIT:  FGA (no AD):  -Item 1 Gait Level Surface: mild impairment 2  -Item 2 Change in Gait Speed: moderate impairment 1   -Item 3 Gait with Horizontal Head Turns: mild impairment 2  -Item 4 Gait with Vertical Head Turns: Normal 3  -Item 5 Gait with Pivot Turn: moderate impairment 1   -Item 6 Step Over Obstacle: mild impairment 2  -Item 7 Gait with Narrow Base of Support: severe impairment 0 -Item 8 Gait with Eyes Closed: mild impairment 2             -Item 9 Ambulating Backwards: moderate impairment 1  -Item 10 Steps: Normal 3 (per pt report but does require inc time/effort) Total: 17 /30  * Score of <=22/30 indicates that patient is at increased risk for falls.   TREATMENT DATE:   [facility] Adult PT Treatment:                                                DATE: 07/03/24 Therapeutic Exercise: Standing heel raise + 3 sec hold at top 3x5 Bosu tke push x12 cues for setup/form HEP update + education/handout  Neuromuscular re-ed: Bosu mini lunge 2x8 BIL cues for form, UE support PRN Mini lunge iso hold w/ PF into bosu ; 2x8 each LE,  UE support, cues for appropriate weight shift Tandem walking along counter 3 laps UE support PRN  Therapeutic Activity: BW STS x8 5xSTS + education     [facility] Adult PT Treatment:                                                DATE: 07/01/24 Therapeutic Exercise: Seated 15# 3-3-3 heel raise, x12 BIL  Standing BW heel raise 2x8 at counter Standing red band (figure 8) march x12 BIL   Neuromuscular re-ed: Cone weaving slow/fast 1 lap Red light/green light 3 laps  Agility ladder 2 in 2 out diagonals 2 laps cues for sequencing/velocity Lateral stepping red band at counter  (figure 8 above knee), 5 laps cues for quick steps and posture Fwd/retro stepping 5 laps red band figure 8 cues for posture    [facility] Adult PT Treatment:                                                DATE: 06/28/2024  Therapeutic Exercise: Seated 15# calf raise 2x12 BIL  Sidelying hip abd 2x10 BIL, 2lb AW  Neuromuscular re-ed (CGA for safety throughout): 2lb AW utilized throughout session.  8inch fwd step ups no UE support x10 BIL cues for control Lateral 8inch fwd step ups x 10 BIL  Airex cone taps level ground, cone inverted 3 x 30 sec, varied cone order per clinician  Dynadisc step ups x10 BIL     PATIENT EDUCATION:  Education details: rationale for interventions, HEP  Person educated: Patient Education method: Explanation, Demonstration, Tactile cues, Verbal cues Education comprehension: verbalized understanding, returned demonstration, verbal cues required, tactile cues required, and needs further education      HOME EXERCISE PROGRAM: Access Code: [email or web address] URL: [email or web address] Date: 07/03/2024 Prepared by: Alm Jenny  Exercises - Sit to Stand with Armchair  - 2-3 x daily - 1 sets - 8 reps - Seated Hamstring Curl with Anchored Resistance  - 2-3 x daily - 1 sets - 10 reps - Heel Raises with Counter Support  - 2-3 x daily - 1 sets - 8 reps - Tandem Walking with Counter Support  - 2-3 x daily - 1 sets - 10 reps  ASSESSMENT:  CLINICAL IMPRESSION: 07/03/2024: pt arrives w/ report of continued gradual improvement, particularly w/ calf strength. 5xSTS is much improved today compared to initial eval and now outside cutoff score for fall risk although on first attempt he does have posterior LOB. Today continuing to progress LE strengthening and postural stability program as above. Cues as above, tolerates well without adverse events, some muscular fatigue as expected. Continues to endorse back pain consistent w/ baseline but no exacerbations w/ activities in  clinic. Recommend continuing along current POC in order to address relevant deficits and improve functional tolerance. Pt departs today's session in no acute distress, all voiced questions/concerns addressed appropriately from PT perspective.      Per eval - Patient is a pleasant 78 y.o. gentleman who was seen today for physical therapy evaluation and treatment for gait disorder. He endorses gradually worsening balance/weakness over last several years which he attributes to his peripheral neuropathy. On exam he demonstrates mild reductions in focal MMT, reduced functional strength and fall risk as  evidenced by 5xSTS, and fall risk as evidenced by FGA. Demonstrates impairments in change of pace, dual tasking, and BOS challenges. No adverse events, tolerates exam and HEP well overall. Recommend trial of skilled PT to address aforementioned deficits with aim of improving functional tolerance and reducing pain with typical activities. Pt departs today's session in no acute distress, all voiced concerns/questions addressed appropriately from PT perspective.      OBJECTIVE IMPAIRMENTS: Abnormal gait, decreased activity tolerance, decreased balance, decreased endurance, decreased mobility, difficulty walking, decreased strength, impaired perceived functional ability, and improper body mechanics.   ACTIVITY LIMITATIONS: carrying, lifting, bending, standing, stairs, transfers, and locomotion level  PARTICIPATION LIMITATIONS: meal prep, cleaning, laundry, community activity, and yard work  PERSONAL FACTORS: Age, Time since onset of injury/illness/exacerbation, and 3+ comorbidities: ascending aortic aneurysm, Afib RVR, hx chest pain, GERD, peripheral neuropathy, insomnia, CI, MRSA, OSA, TIA are also affecting patient's functional outcome.   REHAB POTENTIAL: Good  CLINICAL DECISION MAKING: Evolving/moderate complexity  EVALUATION COMPLEXITY: Moderate   GOALS:   SHORT TERM GOALS: Target date:  06/24/2024   Pt will demonstrate appropriate understanding and performance of initially prescribed HEP in order to facilitate improved independence with management of symptoms.  Baseline: HEP established  06/24/24: reports good HEP performance Goal status: MET  2. Pt will report at least 5 sec improvement in 5xSTS in order to reduce fall risk. (MCID 2.3sec)  Baseline: 24 sec no UE support 07/03/24: 11sec Goal status: MET   LONG TERM GOALS: Target date: 07/15/2024   Pt will score avg 6 or greater on PSFS in order to demonstrate improved perception of functional status due to symptoms.  Baseline: 3 avg Goal status: ONGOING   2.  Pt will demonstrate LE MMT of at least 4+/5 in tested groups in order to facilitate improved functional strength.  Baseline: see MMT chart above  Goal status: ONGOING   3.  Pt will score greater than or equal to 22/30 on Functional Gait assessment in order to indicate reduced fall risk (cutoff score </= 22/30 predictive of falls per Willye et al 2010, MCID 4 pts Beninato et al 2014)  Baseline: 17/30  Goal status: ONGOING   4. Pt will perform 5xSTS in </=14 sec in order to demonstrate reduced fall risk and improved functional independence. (MCID of 2.3sec)  Baseline: 24 sec no UE support  07/03/24: 11sec   Goal status: MET   5. Pt will demonstrate appropriate performance of final prescribed HEP in order to facilitate improved self-management of symptoms post-discharge.   Baseline: initial HEP prescribed  07/03/24: reports good HEP adherence  Goal status: ONGOING   PLAN:  PT FREQUENCY: 1-2x/week  PT DURATION: 6 weeks  PLANNED INTERVENTIONS: [id number]- PT Re-evaluation, 97750- Physical Performance Testing, 97110-Therapeutic exercises, 97530- Therapeutic activity, 97112- Neuromuscular re-education, 97535- Self Care, [id number]- Manual therapy, [phone]- Gait training, Patient/Family education, Balance training, Stair training, Taping, Joint mobilization, Spinal  mobilization, Cryotherapy, and Moist heat.  PLAN FOR NEXT SESSION: Review/update HEP PRN. Work on [employer] exercises as appropriate with emphasis on dual tasking, change of pace, functional strengthening. Symptom modification strategies as indicated/appropriate, mindful of cardiac history.    Alm DELENA Jenny PT, DPT 07/03/2024 12:57 PM

## 2024-07-03 ENCOUNTER — Encounter: Payer: Self-pay | Admitting: Physical Therapy

## 2024-07-03 ENCOUNTER — Ambulatory Visit: Admitting: Physical Therapy

## 2024-07-03 DIAGNOSIS — M6281 Muscle weakness (generalized): Secondary | ICD-10-CM | POA: Diagnosis not present

## 2024-07-03 DIAGNOSIS — R2689 Other abnormalities of gait and mobility: Secondary | ICD-10-CM

## 2024-07-10 ENCOUNTER — Encounter

## 2024-07-10 NOTE — Therapy (Incomplete)
 OUTPATIENT PHYSICAL THERAPY TREATMENT   Patient Name: Dustin Berry. MRN: [id number] DOB:[birth year], 78 y.o., male Today's Date: 07/10/2024  END OF SESSION:  Past Medical History:  Diagnosis Date   Abdominal pain 04/01/2013   Acute bronchitis 03/07/2018   Allergic rhinitis 05/12/2008   Aortic atherosclerosis 08/23/2020   Arthritis    Ascending aortic aneurysm 12/13/2018   Atrial fibrillation with RVR 10/21/2012   Echo- EF 50-55%; mild concentric LVH; flow pattern suggestive of impaired LV relaxation; mild mitral valve prolapse, trace mitral regurgitation   Back pain    receiving PT   Benign prostatic hyperplasia 09/05/2012   Boil of buttock 06/24/2011   Bradycardia 04/17/2013   Cataract    Cellulitis of knee, left 04/12/2016   Chest pain with exertion presumed to be tachycardia related along with SOB 04/01/2013   Degeneration of lumbar intervertebral disc 12/10/2020   Eustachian tube dysfunction, bilateral 03/07/2018   External otitis of left ear 06/11/2020   Fatigue 07/04/2011   GERD (gastroesophageal reflux disease) 03/24/2009   Heart murmur    Hematuria, possible 04/01/2013   Hereditary and idiopathic peripheral neuropathy 05/12/2008   HLD (hyperlipidemia) 08/18/2007   HTN (hypertension) 12/04/2013   Insomnia 06/08/2016   Left knee pain 12/13/2018   Low back pain 12/01/2020   Lumbar spondylosis 05/05/2021   Mild cognitive impairment of uncertain or unknown etiology 12/09/2022   Mitral valve prolapse 07/04/2011   MRSA (methicillin resistant staph aureus) culture positive 5+ years ago   Neck pain 05/05/2021   OSA (obstructive sleep apnea) 07/04/2011   Using CPAP nightly   Pain in right hand 02/04/2020   Palpitations 03/06/2007   R/P MV - mild perfusion defect in basal inferoseptal, basal inferior, mid inferoseptal, and mid inferior regions, consistent w/ infarct/scar; no scintigraphic evidence of inducible myocardial ischemia; prior non transmural infarct cannot  be completely excluded; EF 48%; no significant change from previous study   Personal history of colonic polyps 05/12/2008   Radicular pain in left arm 05/05/2021   Sacroiliitis 05/05/2021   Stricture and stenosis of esophagus 03/24/2009   TIA (transient ischemic attack) 11/22/2021   Tinnitus 09/05/2012   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   Vitamin B12 deficiency 06/15/2019   Vitamin D  deficiency 05/23/2022   Past Surgical History:  Procedure Laterality Date   ATRIAL FIBRILLATION ABLATION     CARDIOVERSION N/A 04/03/2013   Procedure: CARDIOVERSION;  Surgeon: Vinie KYM Maxcy, MD;  Location: [facility] ENDOSCOPY;  Service: Cardiovascular;  Laterality: N/A;   COLONOSCOPY  2018   HAND SURGERY     Thumb joint repair   POLYPECTOMY     TEE WITHOUT CARDIOVERSION N/A 04/03/2013   Procedure: TRANSESOPHAGEAL ECHOCARDIOGRAM (TEE);  Surgeon: Vinie KYM Maxcy, MD;  Location: [facility] ENDOSCOPY;  Service: Cardiovascular;  Laterality: N/A;   TONSILLECTOMY AND ADENOIDECTOMY     UPPER GASTROINTESTINAL ENDOSCOPY     dilation   Patient Active Problem List   Diagnosis Date Noted   Left lateral epicondylitis 05/25/2024   Gait disorder 05/25/2024   Arthritis of left wrist 03/04/2024   Encounter for orthopedic follow-up care 03/04/2024   Right upper quadrant abdominal pain 01/27/2024   Alzheimer dementia (HCC) 12/26/2023   Mild cognitive impairment of uncertain or unknown etiology 12/09/2022   Vitamin D  deficiency 05/23/2022   TIA (transient ischemic attack) 11/22/2021   Aortic root aneurysm 07/30/2021   Lumbar spondylosis 05/05/2021   Neck pain 05/05/2021   Radicular pain in left arm 05/05/2021   Sacroiliitis 05/05/2021   Degeneration of lumbar  intervertebral disc 12/10/2020   Low back pain 12/01/2020   Pain of right hip joint 10/22/2020   Aortic atherosclerosis 08/23/2020   External otitis of left ear 06/11/2020   Pain in right hand 02/04/2020   Vitamin B12 deficiency 06/15/2019   Weight loss 12/13/2018    Left knee pain 12/13/2018   Ascending aortic aneurysm 12/13/2018   Eustachian tube dysfunction, bilateral 03/07/2018   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   Insomnia 06/08/2016   Cellulitis of knee, left 04/12/2016   HTN (hypertension) 12/04/2013   Bradycardia 04/17/2013   Atrial fibrillation with RVR 04/01/2013   Chest pain with exertion presumed to be tachycardia related along with SOB 04/01/2013   Hematuria 04/01/2013   Benign prostatic hyperplasia 09/05/2012   Tinnitus 09/05/2012   OSA (obstructive sleep apnea) 07/04/2011   Mitral valve prolapse 07/04/2011   Fatigue 07/04/2011   Stricture and stenosis of esophagus 03/24/2009   GERD (gastroesophageal reflux disease) 03/24/2009   Hereditary and idiopathic peripheral neuropathy 05/12/2008   Allergic rhinitis 05/12/2008   History of colonic polyps 05/12/2008   HLD (hyperlipidemia) 08/18/2007    PCP: Norleen Lynwood ORN, MD  REFERRING PROVIDER: Norleen Lynwood ORN, MD  REFERRING DIAG: R26.9 (ICD-10-CM) - Gait disorder  Rationale for Evaluation and Treatment: Rehabilitation  THERAPY DIAG:  No diagnosis found.  ONSET DATE: gradually worsening over past several years  SUBJECTIVE:                                                                                                                                                                                          Per eval: Pt endorses history of peripheral neuropathy with gradually worsening weakness which is now affecting his walking. Feels his calves are weaker, can no longer do heel raise. Feels his light touch is muted pretty significantly in both feet.  Used to be quite active, enjoyed playing basketball into his 46s. Enjoys going to gym but has been less active over last couple years.  Difficulty navigating hills, prolonged walking.   SUBJECTIVE STATEMENT: 07/10/2024: ***  arrives w/ report of baseline back pain, no other new issues. States balance seems to be improving, as does calf  strength.  PERTINENT HISTORY:  ascending aortic aneurysm, Afib RVR, hx chest pain, GERD, peripheral neuropathy, insomnia, CI, MRSA, OSA, TIA Describes Afib as stable, no pacemaker   PAIN:  Reports hx of chronic back pain - standing, walking, bending; states it is actually improving some w/ chiropractic care   PRECAUTIONS: fall risk, cardiac hx  RED FLAGS: None   WEIGHT BEARING RESTRICTIONS: No  FALLS:  Has patient fallen in last 6 months? No  LIVING ENVIRONMENT: 2  story home, bed/bath upstairs and main floor. 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   PATIENT SURVEYS:  PSFS:  - walking, 5  - heel raise, 0  - sit<>stand, 4  Total: 9; avg 3  COGNITION: Overall cognitive status: Within functional limits for tasks assessed     SENSATION: Light touch intact BIL LE aside from plantar aspect of BIL feet R>L per pt report   LOWER EXTREMITY MMT:    MMT Right eval Left eval  Hip flexion 4 4  Hip abduction (modified sitting) 5 5  Hip internal rotation    Hip external rotation    Knee flexion 4- 4  Knee extension 5 5  Ankle dorsiflexion 5 5   (Blank rows = not tested) (Key: WFL = within functional limits not formally assessed, * = concordant pain, s = stiffness/stretching sensation, NT = not tested)  Comments:     FUNCTIONAL TESTS:  5xSTS: 24.67sec w/o UE support   - 07/03/24 5xSTS 11.07 sec w/o UE support (1 instance of posterior LOB on ascent)   06/10/24: Heel raise 2 cm difference less on R than L   GAIT:  FGA (no AD):  -Item 1 Gait Level  Surface: mild impairment 2  -Item 2 Change in Gait Speed: moderate impairment 1   -Item 3 Gait with Horizontal Head Turns: mild impairment 2  -Item 4 Gait with Vertical Head Turns: Normal 3  -Item 5 Gait with Pivot Turn: moderate impairment 1   -Item 6 Step Over Obstacle: mild impairment 2  -Item 7 Gait with Narrow Base of Support: severe impairment 0 -Item 8 Gait with Eyes Closed: mild impairment 2             -Item 9 Ambulating Backwards: moderate impairment 1  -Item 10 Steps: Normal 3 (per pt report but does require inc time/effort) Total: 17 /30  * Score of <=22/30 indicates that patient is at increased risk for falls.   TREATMENT DATE:  [facility] Adult PT Treatment:                                                DATE: 07/10/24 Therapeutic Exercise: Standing heel raise + 3 sec hold at top 3x5 Bosu tke push x12 cues for setup/form Neuromuscular re-ed: Bosu mini lunge 2x8 BIL cues for form, UE support PRN Mini lunge iso hold w/ PF into bosu ; 2x8 each LE, UE support, cues for appropriate weight shift Tandem walking along counter 3 laps UE support PRN Therapeutic Activity: BW STS x8   [facility] Adult PT Treatment:                                                DATE: 07/03/24 Therapeutic Exercise: Standing heel raise + 3 sec hold at top 3x5 Bosu tke push x12 cues  for setup/form HEP update + education/handout  Neuromuscular re-ed: Bosu mini lunge 2x8 BIL cues for form, UE support PRN Mini lunge iso hold w/ PF into bosu ; 2x8 each LE, UE support, cues for appropriate weight shift Tandem walking along counter 3 laps UE support PRN  Therapeutic Activity: BW STS x8 5xSTS + education     [facility] Adult PT Treatment:                                                DATE: 07/01/24 Therapeutic Exercise: Seated 15# 3-3-3 heel raise, x12 BIL  Standing BW heel raise 2x8 at counter Standing red band (figure 8) march x12 BIL   Neuromuscular re-ed: [facility] weaving slow/fast 1 lap Red light/green light  3 laps  Agility ladder 2 in 2 out diagonals 2 laps cues for sequencing/velocity Lateral stepping red band at counter (figure 8 above knee), 5 laps cues for quick steps and posture Fwd/retro stepping 5 laps red band figure 8 cues for posture    PATIENT EDUCATION:  Education details: rationale for interventions, HEP  Person educated: Patient Education method: Programmer, multimedia, Demonstration, Tactile cues, Verbal cues Education comprehension: verbalized understanding, returned demonstration, verbal cues required, tactile cues required, and needs further education      HOME EXERCISE PROGRAM: Access Code: [email or web address] URL: [email or web address] Date: 07/03/2024 Prepared by: Alm Jenny  Exercises - Sit to Stand with Armchair  - 2-3 x daily - 1 sets - 8 reps - Seated Hamstring Curl with Anchored Resistance  - 2-3 x daily - 1 sets - 10 reps - Heel Raises with Counter Support  - 2-3 x daily - 1 sets - 8 reps - Tandem Walking with Counter Support  - 2-3 x daily - 1 sets - 10 reps  ASSESSMENT:  CLINICAL IMPRESSION: 07/10/2024: ***  pt arrives w/ report of continued gradual improvement, particularly w/ calf strength. 5xSTS is much improved today compared to initial eval and now outside cutoff score for fall risk although on first attempt he does have posterior LOB. Today continuing to progress LE strengthening and postural stability program as above. Cues as above, tolerates well without adverse events, some muscular fatigue as expected. Continues to endorse back pain consistent w/ baseline but no exacerbations w/ activities in clinic. Recommend continuing along current POC in order to address relevant deficits and improve functional tolerance. Pt departs today's session in no acute distress, all voiced questions/concerns addressed appropriately from PT perspective.      Per eval - Patient is a pleasant 78 y.o. gentleman who was seen today for physical therapy evaluation and treatment for  gait disorder. He endorses gradually worsening balance/weakness over last several years which he attributes to his peripheral neuropathy. On exam he demonstrates mild reductions in focal MMT, reduced functional strength and fall risk as evidenced by 5xSTS, and fall risk as evidenced by FGA. Demonstrates impairments in change of pace, dual tasking, and BOS challenges. No adverse events, tolerates exam and HEP well overall. Recommend trial of skilled PT to address aforementioned deficits with aim of improving functional tolerance and reducing pain with typical activities. Pt departs today's session in no acute distress, all voiced concerns/questions addressed appropriately from PT perspective.      OBJECTIVE IMPAIRMENTS: Abnormal gait, decreased activity tolerance, decreased balance, decreased endurance, decreased mobility, difficulty walking, decreased strength, impaired perceived functional ability, and improper body  mechanics.   ACTIVITY LIMITATIONS: carrying, lifting, bending, standing, stairs, transfers, and locomotion level  PARTICIPATION LIMITATIONS: meal prep, cleaning, laundry, community activity, and yard work  PERSONAL FACTORS: Age, Time since onset of injury/illness/exacerbation, and 3+ comorbidities: ascending aortic aneurysm, Afib RVR, hx chest pain, GERD, peripheral neuropathy, insomnia, CI, MRSA, OSA, TIA are also affecting patient's functional outcome.   REHAB POTENTIAL: Good  CLINICAL DECISION MAKING: Evolving/moderate complexity  EVALUATION COMPLEXITY: Moderate   GOALS:   SHORT TERM GOALS: Target date: 06/24/2024   Pt will demonstrate appropriate understanding and performance of initially prescribed HEP in order to facilitate improved independence with management of symptoms.  Baseline: HEP established  06/24/24: reports good HEP performance Goal status: MET  2. Pt will report at least 5 sec improvement in 5xSTS in order to reduce fall risk. (MCID 2.3sec)  Baseline: 24 sec  no UE support 07/03/24: 11sec Goal status: MET   LONG TERM GOALS: Target date: 07/15/2024   Pt will score avg 6 or greater on PSFS in order to demonstrate improved perception of functional status due to symptoms.  Baseline: 3 avg Goal status: ONGOING   2.  Pt will demonstrate LE MMT of at least 4+/5 in tested groups in order to facilitate improved functional strength.  Baseline: see MMT chart above  Goal status: ONGOING   3.  Pt will score greater than or equal to 22/30 on Functional Gait assessment in order to indicate reduced fall risk (cutoff score </= 22/30 predictive of falls per Willye et al 2010, MCID 4 pts Beninato et al 2014)  Baseline: 17/30  Goal status: ONGOING   4. Pt will perform 5xSTS in </=14 sec in order to demonstrate reduced fall risk and improved functional independence. (MCID of 2.3sec)  Baseline: 24 sec no UE support  07/03/24: 11sec   Goal status: MET   5. Pt will demonstrate appropriate performance of final prescribed HEP in order to facilitate improved self-management of symptoms post-discharge.   Baseline: initial HEP prescribed  07/03/24: reports good HEP adherence  Goal status: ONGOING   PLAN:  PT FREQUENCY: 1-2x/week  PT DURATION: 6 weeks  PLANNED INTERVENTIONS: [id number]- PT Re-evaluation, 97750- Physical Performance Testing, 97110-Therapeutic exercises, 97530- Therapeutic activity, 97112- Neuromuscular re-education, 97535- Self Care, [id number]- Manual therapy, [phone]- Gait training, Patient/Family education, Balance training, Stair training, Taping, Joint mobilization, Spinal mobilization, Cryotherapy, and Moist heat.  PLAN FOR NEXT SESSION: Review/update HEP PRN. Work on [employer] exercises as appropriate with emphasis on dual tasking, change of pace, functional strengthening. Symptom modification strategies as indicated/appropriate, mindful of cardiac history.    Corean Pouch PTA  07/10/2024 4:14 PM

## 2024-07-12 ENCOUNTER — Ambulatory Visit: Payer: Self-pay | Attending: Internal Medicine

## 2024-07-12 DIAGNOSIS — M6281 Muscle weakness (generalized): Secondary | ICD-10-CM | POA: Insufficient documentation

## 2024-07-12 DIAGNOSIS — R2689 Other abnormalities of gait and mobility: Secondary | ICD-10-CM | POA: Insufficient documentation

## 2024-07-12 NOTE — Therapy (Signed)
 OUTPATIENT PHYSICAL THERAPY TREATMENT   Patient Name: Dustin Berry. MRN: [id number] DOB:1946/11/11, 78 y.o., male Today's Date: 07/12/2024  END OF SESSION:  PT End of Session - 07/12/24 1357     Visit Number 9    Number of Visits 13    Date for PT Re-Evaluation 07/15/24    Authorization Type MDC    Progress Note Due on Visit 10    PT Start Time 1351    PT Stop Time 1424    PT Time Calculation (min) 33 min    Activity Tolerance Patient tolerated treatment well    Behavior During Therapy [facility] for tasks assessed/performed                 Past Medical History:  Diagnosis Date   Abdominal pain 04/01/2013   Acute bronchitis 03/07/2018   Allergic rhinitis 05/12/2008   Aortic atherosclerosis 08/23/2020   Arthritis    Ascending aortic aneurysm 12/13/2018   Atrial fibrillation with RVR 10/21/2012   Echo- EF 50-55%; mild concentric LVH; flow pattern suggestive of impaired LV relaxation; mild mitral valve prolapse, trace mitral regurgitation   Back pain    receiving PT   Benign prostatic hyperplasia 09/05/2012   Boil of buttock 06/24/2011   Bradycardia 04/17/2013   Cataract    Cellulitis of knee, left 04/12/2016   Chest pain with exertion presumed to be tachycardia related along with SOB 04/01/2013   Degeneration of lumbar intervertebral disc 12/10/2020   Eustachian tube dysfunction, bilateral 03/07/2018   External otitis of left ear 06/11/2020   Fatigue 07/04/2011   GERD (gastroesophageal reflux disease) 03/24/2009   Heart murmur    Hematuria, possible 04/01/2013   Hereditary and idiopathic peripheral neuropathy 05/12/2008   HLD (hyperlipidemia) 08/18/2007   HTN (hypertension) 12/04/2013   Insomnia 06/08/2016   Left knee pain 12/13/2018   Low back pain 12/01/2020   Lumbar spondylosis 05/05/2021   Mild cognitive impairment of uncertain or unknown etiology 12/09/2022   Mitral valve prolapse 07/04/2011   MRSA (methicillin resistant staph aureus) culture  positive 5+ years ago   Neck pain 05/05/2021   OSA (obstructive sleep apnea) 07/04/2011   Using CPAP nightly   Pain in right hand 02/04/2020   Palpitations 03/06/2007   R/P MV - mild perfusion defect in basal inferoseptal, basal inferior, mid inferoseptal, and mid inferior regions, consistent w/ infarct/scar; no scintigraphic evidence of inducible myocardial ischemia; prior non transmural infarct cannot be completely excluded; EF 48%; no significant change from previous study   Personal history of colonic polyps 05/12/2008   Radicular pain in left arm 05/05/2021   Sacroiliitis 05/05/2021   Stricture and stenosis of esophagus 03/24/2009   TIA (transient ischemic attack) 11/22/2021   Tinnitus 09/05/2012   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   Vitamin B12 deficiency 06/15/2019   Vitamin D  deficiency 05/23/2022   Past Surgical History:  Procedure Laterality Date   ATRIAL FIBRILLATION ABLATION     CARDIOVERSION N/A 04/03/2013   Procedure: CARDIOVERSION;  Surgeon: Vinie KYM Maxcy, MD;  Location: [facility] ENDOSCOPY;  Service: Cardiovascular;  Laterality: N/A;   COLONOSCOPY  2018   HAND SURGERY     Thumb joint repair   POLYPECTOMY     TEE WITHOUT CARDIOVERSION N/A 04/03/2013   Procedure: TRANSESOPHAGEAL ECHOCARDIOGRAM (TEE);  Surgeon: Vinie KYM Maxcy, MD;  Location: [facility] ENDOSCOPY;  Service: Cardiovascular;  Laterality: N/A;   TONSILLECTOMY AND ADENOIDECTOMY     UPPER GASTROINTESTINAL ENDOSCOPY     dilation   Patient Active Problem List  Diagnosis Date Noted   Left lateral epicondylitis 05/25/2024   Gait disorder 05/25/2024   Arthritis of left wrist 03/04/2024   Encounter for orthopedic follow-up care 03/04/2024   Right upper quadrant abdominal pain 01/27/2024   Alzheimer dementia (HCC) 12/26/2023   Mild cognitive impairment of uncertain or unknown etiology 12/09/2022   Vitamin D  deficiency 05/23/2022   TIA (transient ischemic attack) 11/22/2021   Aortic root aneurysm 07/30/2021   Lumbar  spondylosis 05/05/2021   Neck pain 05/05/2021   Radicular pain in left arm 05/05/2021   Sacroiliitis 05/05/2021   Degeneration of lumbar intervertebral disc 12/10/2020   Low back pain 12/01/2020   Pain of right hip joint 10/22/2020   Aortic atherosclerosis 08/23/2020   External otitis of left ear 06/11/2020   Pain in right hand 02/04/2020   Vitamin B12 deficiency 06/15/2019   Weight loss 12/13/2018   Left knee pain 12/13/2018   Ascending aortic aneurysm 12/13/2018   Eustachian tube dysfunction, bilateral 03/07/2018   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   Insomnia 06/08/2016   Cellulitis of knee, left 04/12/2016   HTN (hypertension) 12/04/2013   Bradycardia 04/17/2013   Atrial fibrillation with RVR 04/01/2013   Chest pain with exertion presumed to be tachycardia related along with SOB 04/01/2013   Hematuria 04/01/2013   Benign prostatic hyperplasia 09/05/2012   Tinnitus 09/05/2012   OSA (obstructive sleep apnea) 07/04/2011   Mitral valve prolapse 07/04/2011   Fatigue 07/04/2011   Stricture and stenosis of esophagus 03/24/2009   GERD (gastroesophageal reflux disease) 03/24/2009   Hereditary and idiopathic peripheral neuropathy 05/12/2008   Allergic rhinitis 05/12/2008   History of colonic polyps 05/12/2008   HLD (hyperlipidemia) 08/18/2007    PCP: Norleen Lynwood ORN, MD  REFERRING PROVIDER: Norleen Lynwood ORN, MD  REFERRING DIAG: R26.9 (ICD-10-CM) - Gait disorder  Rationale for Evaluation and Treatment: Rehabilitation  THERAPY DIAG:  Muscle weakness (generalized)  Other abnormalities of gait and mobility  ONSET DATE: gradually worsening over past several years  SUBJECTIVE:                                                                                                                                                                                          Per eval: Pt endorses history of peripheral neuropathy with gradually worsening weakness which is now affecting his walking. Feels  his calves are weaker, can no longer do heel raise. Feels his light touch is muted pretty significantly in both feet.  Used to be quite active, enjoyed playing basketball into his 53s. Enjoys going to gym but has been less active over last couple years.  Difficulty navigating hills, prolonged walking.  SUBJECTIVE STATEMENT: 07/12/2024: Patient reports that he has been doing well overall with PT. He notes that he has less frequent pain and tightness in his calves with walking and standing. He denies any falls. He states that recently he has been experiencing some lightheadness after standing up from the chair that last for about 1 minute. He also has noticed this after bending forward. Denies recent changes in BP medication.   PERTINENT HISTORY:  ascending aortic aneurysm, Afib RVR, hx chest pain, GERD, peripheral neuropathy, insomnia, CI, MRSA, OSA, TIA Describes Afib as stable, no pacemaker   PAIN:  Reports hx of chronic back pain - standing, walking, bending; states it is actually improving some w/ chiropractic care   PRECAUTIONS: fall risk, cardiac hx  RED FLAGS: None   WEIGHT BEARING RESTRICTIONS: No  FALLS:  Has patient fallen in last 6 months? No  LIVING ENVIRONMENT: 2 story home, bed/bath upstairs and main floor. 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   PATIENT SURVEYS:  PSFS:  - walking, 5  - heel raise, 0  - sit<>stand, 4  Total: 9; avg 3  07/12/24:   - walking 7  - heel raise 10  - sit <> stand 10  Total: 27, Avg: 9    Not calculated:  ascending/descending stairs 7    COGNITION: Overall cognitive status: Within functional limits for tasks assessed     SENSATION: Light touch intact BIL LE aside from plantar aspect of BIL feet R>L per pt report   LOWER EXTREMITY MMT:    MMT Right eval Left eval  Hip flexion 4 4  Hip abduction (modified sitting) 5 5  Hip internal rotation    Hip external rotation    Knee flexion 4- 4  Knee extension 5 5  Ankle dorsiflexion 5 5   (Blank rows = not tested) (Key: WFL = within functional limits not formally assessed, * = concordant pain, s = stiffness/stretching sensation, NT = not tested)  Comments:     FUNCTIONAL TESTS:  5xSTS: 24.67sec w/o UE support   - 07/03/24 5xSTS 11.07 sec w/o UE support (1 instance of posterior LOB on ascent)   06/10/24: Heel raise 2 cm difference less on R than L   GAIT:  FGA (no AD):  -Item 1 Gait Level Surface: mild impairment 2  -Item 2 Change in Gait Speed: moderate impairment 1   -Item 3 Gait with Horizontal Head Turns: mild impairment 2  -Item 4 Gait with Vertical Head Turns: Normal 3  -Item 5 Gait with Pivot Turn: moderate impairment 1   -Item 6 Step Over Obstacle: mild impairment 2  -Item 7 Gait with Narrow Base of Support: severe impairment 0 -Item 8 Gait with Eyes Closed: mild impairment 2             -Item 9 Ambulating Backwards: moderate impairment 1  -Item 10 Steps: Normal 3 (per pt report but does require inc time/effort) Total: 17 /30  * Score of <=22/30 indicates that patient is at increased risk for falls.   TREATMENT DATE:    [facility] Adult  PT Treatment:                                                DATE: 07/12/2024  Therapeutic Exercise: Standing heel-toe raises x 20  Squat at //bars 2 x 10 with intermittent cueing  Denies lightheadedness  Neuromuscular re-ed: Mini lunge iso hold w/ PF into bosu ; 2 x 10 each LE, UE support, cues for appropriate weight shift Tandem walking at //bars 3 laps UE support PRN  Therapeutic  Activity: PSFS assessment + education and related discharge planning  Ambulation over (gymnastic) mat x 2 laps each; with gait belt/CGA  Fwd eyes open Fwd eyes closed Backward eyes open Backward eyes closed   [facility] Adult PT Treatment:                                                DATE: 07/03/24 Therapeutic Exercise: Standing heel raise + 3 sec hold at top 3x5 Bosu tke push x12 cues for setup/form HEP update + education/handout  Neuromuscular re-ed: Bosu mini lunge 2x8 BIL cues for form, UE support PRN Mini lunge iso hold w/ PF into bosu ; 2x8 each LE, UE support, cues for appropriate weight shift Tandem walking along counter 3 laps UE support PRN  Therapeutic Activity: BW STS x8 5xSTS + education   [facility] Adult PT Treatment:                                                DATE: 07/01/24 Therapeutic Exercise: Seated 15# 3-3-3 heel raise, x12 BIL  Standing BW heel raise 2x8 at counter Standing red band (figure 8) march x12 BIL   Neuromuscular re-ed: [facility] weaving slow/fast 1 lap Red light/green light 3 laps  Agility ladder 2 in 2 out diagonals 2 laps cues for sequencing/velocity Lateral stepping red band at counter (figure 8 above knee), 5 laps cues for quick steps and posture Fwd/retro stepping 5 laps red band figure 8 cues for posture     PATIENT EDUCATION:  Education details: rationale for interventions, HEP  Person educated: Patient Education method: Programmer, multimedia, Demonstration, Tactile cues, Verbal cues Education comprehension: verbalized understanding, returned demonstration, verbal cues required, tactile cues required, and needs further education      HOME EXERCISE PROGRAM: Access Code: [email or web address] URL: [email or web address] Date: 07/03/2024 Prepared by: Alm Jenny  Exercises - Sit to Stand with Armchair  - 2-3 x daily - 1 sets - 8 reps - Seated Hamstring Curl with Anchored Resistance  - 2-3 x daily - 1 sets - 10 reps - Heel Raises with Counter  Support  - 2-3 x daily - 1 sets - 8 reps - Tandem Walking with Counter Support  - 2-3 x daily - 1 sets - 10 reps  ASSESSMENT:  CLINICAL IMPRESSION: 07/12/2024: Patient continues to demonstrate good overall improvement with skilled PT. He is demonstrating improved static and dynamic balance with today's interventions. His average PSFS score has increased from to 9. Encouraged patient to f/u with PCP regarding recent increase in lightheadness after standing up from seated position. Plan is to continue with dynamic balance and  ambulation activities at remaining visit. Patient also requests review/HEP for returning to independent activity at [facility] following d/c from PT. We will also plan to address this at remaining visit(s).    Per eval - Patient is a pleasant 78 y.o. gentleman who was seen today for physical therapy evaluation and treatment for gait disorder. He endorses gradually worsening balance/weakness over last several years which he attributes to his peripheral neuropathy. On exam he demonstrates mild reductions in focal MMT, reduced functional strength and fall risk as evidenced by 5xSTS, and fall risk as evidenced by FGA. Demonstrates impairments in change of pace, dual tasking, and BOS challenges. No adverse events, tolerates exam and HEP well overall. Recommend trial of skilled PT to address aforementioned deficits with aim of improving functional tolerance and reducing pain with typical activities. Pt departs today's session in no acute distress, all voiced concerns/questions addressed appropriately from PT perspective.      OBJECTIVE IMPAIRMENTS: Abnormal gait, decreased activity tolerance, decreased balance, decreased endurance, decreased mobility, difficulty walking, decreased strength, impaired perceived functional ability, and improper body mechanics.   ACTIVITY LIMITATIONS: carrying, lifting, bending, standing, stairs, transfers, and locomotion level  PARTICIPATION LIMITATIONS: meal prep,  cleaning, laundry, community activity, and yard work  PERSONAL FACTORS: Age, Time since onset of injury/illness/exacerbation, and 3+ comorbidities: ascending aortic aneurysm, Afib RVR, hx chest pain, GERD, peripheral neuropathy, insomnia, CI, MRSA, OSA, TIA are also affecting patient's functional outcome.   REHAB POTENTIAL: Good  CLINICAL DECISION MAKING: Evolving/moderate complexity  EVALUATION COMPLEXITY: Moderate   GOALS:   SHORT TERM GOALS: Target date: 06/24/2024   Pt will demonstrate appropriate understanding and performance of initially prescribed HEP in order to facilitate improved independence with management of symptoms.  Baseline: HEP established  06/24/24: reports good HEP performance Goal status: MET  2. Pt will report at least 5 sec improvement in 5xSTS in order to reduce fall risk. (MCID 2.3sec)  Baseline: 24 sec no UE support 07/03/24: 11sec Goal status: MET   LONG TERM GOALS: Target date: 07/15/2024   Pt will score avg 6 or greater on PSFS in order to demonstrate improved perception of functional status due to symptoms.  Baseline: 3 avg 07/12/2024: 9 avg Goal status: MET  2.  Pt will demonstrate LE MMT of at least 4+/5 in tested groups in order to facilitate improved functional strength.  Baseline: see MMT chart above  Goal status: ONGOING   3.  Pt will score greater than or equal to 22/30 on Functional Gait assessment in order to indicate reduced fall risk (cutoff score </= 22/30 predictive of falls per Willye et al 2010, MCID 4 pts Beninato et al 2014)  Baseline: 17/30  Goal status: ONGOING   4. Pt will perform 5xSTS in </=14 sec in order to demonstrate reduced fall risk and improved functional independence. (MCID of 2.3sec)  Baseline: 24 sec no UE support  07/03/24: 11sec   Goal status: MET   5. Pt will demonstrate appropriate performance of final prescribed HEP in order to facilitate improved self-management of symptoms post-discharge.   Baseline:  initial HEP prescribed  07/03/24: reports good HEP adherence  Goal status: ONGOING   PLAN:  PT FREQUENCY: 1-2x/week  PT DURATION: 6 weeks  PLANNED INTERVENTIONS: [id number]- PT Re-evaluation, 97750- Physical Performance Testing, 97110-Therapeutic exercises, 97530- Therapeutic activity, 97112- Neuromuscular re-education, 97535- Self Care, [id number]- Manual therapy, [phone]- Gait training, Patient/Family education, Balance training, Stair training, Taping, Joint mobilization, Spinal mobilization, Cryotherapy, and Moist heat.  PLAN FOR NEXT SESSION: Review/update HEP  PRN. mindful of cardiac history.  focus on dynamic balance/ambulation and promote independent with exercises for return to [facility] following d/c from PT    Marko Molt, PT, DPT  07/12/2024 7:12 PM

## 2024-07-15 ENCOUNTER — Ambulatory Visit: Admitting: Physical Therapy

## 2024-07-15 ENCOUNTER — Encounter: Payer: Self-pay | Admitting: Physical Therapy

## 2024-07-15 DIAGNOSIS — M6281 Muscle weakness (generalized): Secondary | ICD-10-CM

## 2024-07-15 DIAGNOSIS — R2689 Other abnormalities of gait and mobility: Secondary | ICD-10-CM

## 2024-07-15 NOTE — Therapy (Addendum)
 OUTPATIENT PHYSICAL THERAPY TREATMENT, PROGRESS NOTE, AND DISCHARGE  PHYSICAL THERAPY DISCHARGE SUMMARY  Visits from Start of Care: 10  Current functional level related to goals / functional outcomes: See below   Remaining deficits: See below   Education / Equipment: Final HEP, gym equipment to use   Patient agrees to discharge. Patient goals were met. Patient is being discharged due to meeting the stated rehab goals.  Progress Note Reporting Period 06/03/24 to 07/15/2024  See note below for Objective Data and Assessment of Progress/Goals.      Patient Name: Dustin Berry. MRN: [id number] DOB:[birth year], 78 y.o., male Today's Date: 07/15/2024  END OF SESSION:  PT End of Session - 07/15/24 1442     Visit Number 10    Number of Visits 13    Date for PT Re-Evaluation 07/15/24    Authorization Type MDC    Progress Note Due on Visit 10    PT Start Time 1442    PT Stop Time 1520    PT Time Calculation (min) 38 min    Activity Tolerance Patient tolerated treatment well    Behavior During Therapy [facility] for tasks assessed/performed                  Past Medical History:  Diagnosis Date   Abdominal pain 04/01/2013   Acute bronchitis 03/07/2018   Allergic rhinitis 05/12/2008   Aortic atherosclerosis 08/23/2020   Arthritis    Ascending aortic aneurysm 12/13/2018   Atrial fibrillation with RVR 10/21/2012   Echo- EF 50-55%; mild concentric LVH; flow pattern suggestive of impaired LV relaxation; mild mitral valve prolapse, trace mitral regurgitation   Back pain    receiving PT   Benign prostatic hyperplasia 09/05/2012   Boil of buttock 06/24/2011   Bradycardia 04/17/2013   Cataract    Cellulitis of knee, left 04/12/2016   Chest pain with exertion presumed to be tachycardia related along with SOB 04/01/2013   Degeneration of lumbar intervertebral disc 12/10/2020   Eustachian tube dysfunction, bilateral 03/07/2018   External otitis of left ear 06/11/2020    Fatigue 07/04/2011   GERD (gastroesophageal reflux disease) 03/24/2009   Heart murmur    Hematuria, possible 04/01/2013   Hereditary and idiopathic peripheral neuropathy 05/12/2008   HLD (hyperlipidemia) 08/18/2007   HTN (hypertension) 12/04/2013   Insomnia 06/08/2016   Left knee pain 12/13/2018   Low back pain 12/01/2020   Lumbar spondylosis 05/05/2021   Mild cognitive impairment of uncertain or unknown etiology 12/09/2022   Mitral valve prolapse 07/04/2011   MRSA (methicillin resistant staph aureus) culture positive 5+ years ago   Neck pain 05/05/2021   OSA (obstructive sleep apnea) 07/04/2011   Using CPAP nightly   Pain in right hand 02/04/2020   Palpitations 03/06/2007   R/P MV - mild perfusion defect in basal inferoseptal, basal inferior, mid inferoseptal, and mid inferior regions, consistent w/ infarct/scar; no scintigraphic evidence of inducible myocardial ischemia; prior non transmural infarct cannot be completely excluded; EF 48%; no significant change from previous study   Personal history of colonic polyps 05/12/2008   Radicular pain in left arm 05/05/2021   Sacroiliitis 05/05/2021   Stricture and stenosis of esophagus 03/24/2009   TIA (transient ischemic attack) 11/22/2021   Tinnitus 09/05/2012   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   Vitamin B12 deficiency 06/15/2019   Vitamin D  deficiency 05/23/2022   Past Surgical History:  Procedure Laterality Date   ATRIAL FIBRILLATION ABLATION     CARDIOVERSION N/A 04/03/2013   Procedure: CARDIOVERSION;  Surgeon: Vinie KYM Maxcy, MD;  Location: [facility] ENDOSCOPY;  Service: Cardiovascular;  Laterality: N/A;   COLONOSCOPY  2018   HAND SURGERY     Thumb joint repair   POLYPECTOMY     TEE WITHOUT CARDIOVERSION N/A 04/03/2013   Procedure: TRANSESOPHAGEAL ECHOCARDIOGRAM (TEE);  Surgeon: Vinie KYM Maxcy, MD;  Location: [facility] ENDOSCOPY;  Service: Cardiovascular;  Laterality: N/A;   TONSILLECTOMY AND ADENOIDECTOMY     UPPER GASTROINTESTINAL  ENDOSCOPY     dilation   Patient Active Problem List   Diagnosis Date Noted   Left lateral epicondylitis 05/25/2024   Gait disorder 05/25/2024   Arthritis of left wrist 03/04/2024   Encounter for orthopedic follow-up care 03/04/2024   Right upper quadrant abdominal pain 01/27/2024   Alzheimer dementia (HCC) 12/26/2023   Mild cognitive impairment of uncertain or unknown etiology 12/09/2022   Vitamin D  deficiency 05/23/2022   TIA (transient ischemic attack) 11/22/2021   Aortic root aneurysm 07/30/2021   Lumbar spondylosis 05/05/2021   Neck pain 05/05/2021   Radicular pain in left arm 05/05/2021   Sacroiliitis 05/05/2021   Degeneration of lumbar intervertebral disc 12/10/2020   Low back pain 12/01/2020   Pain of right hip joint 10/22/2020   Aortic atherosclerosis 08/23/2020   External otitis of left ear 06/11/2020   Pain in right hand 02/04/2020   Vitamin B12 deficiency 06/15/2019   Weight loss 12/13/2018   Left knee pain 12/13/2018   Ascending aortic aneurysm 12/13/2018   Eustachian tube dysfunction, bilateral 03/07/2018   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   Insomnia 06/08/2016   Cellulitis of knee, left 04/12/2016   HTN (hypertension) 12/04/2013   Bradycardia 04/17/2013   Atrial fibrillation with RVR 04/01/2013   Chest pain with exertion presumed to be tachycardia related along with SOB 04/01/2013   Hematuria 04/01/2013   Benign prostatic hyperplasia 09/05/2012   Tinnitus 09/05/2012   OSA (obstructive sleep apnea) 07/04/2011   Mitral valve prolapse 07/04/2011   Fatigue 07/04/2011   Stricture and stenosis of esophagus 03/24/2009   GERD (gastroesophageal reflux disease) 03/24/2009   Hereditary and idiopathic peripheral neuropathy 05/12/2008   Allergic rhinitis 05/12/2008   History of colonic polyps 05/12/2008   HLD (hyperlipidemia) 08/18/2007    PCP: Norleen Lynwood ORN, MD  REFERRING PROVIDER: Norleen Lynwood ORN, MD  REFERRING DIAG: R26.9 (ICD-10-CM) - Gait disorder  Rationale  for Evaluation and Treatment: Rehabilitation  THERAPY DIAG:  Muscle weakness (generalized)  Other abnormalities of gait and mobility  ONSET DATE: gradually worsening over past several years  SUBJECTIVE:  Per eval: Pt endorses history of peripheral neuropathy with gradually worsening weakness which is now affecting his walking. Feels his calves are weaker, can no longer do heel raise. Feels his light touch is muted pretty significantly in both feet.  Used to be quite active, enjoyed playing basketball into his 75s. Enjoys going to gym but has been less active over last couple years.  Difficulty navigating hills, prolonged walking.   SUBJECTIVE STATEMENT: 07/15/2024: Pt reports some continued lightheadedness with first standing up. No pain currently. A little back pain now and then. Feels ready to d/c from PT. Wants to go back to the [facility].   PERTINENT HISTORY:  ascending aortic aneurysm, Afib RVR, hx chest pain, GERD, peripheral neuropathy, insomnia, CI, MRSA, OSA, TIA Describes Afib as stable, no pacemaker   PAIN:  Reports hx of chronic back pain - standing, walking, bending; states it is actually improving some w/ chiropractic care   PRECAUTIONS: fall risk, cardiac hx  RED FLAGS: None   WEIGHT BEARING RESTRICTIONS: No  FALLS:  Has patient fallen in last 6 months? No  LIVING ENVIRONMENT: 2 story home, bed/bath upstairs and main floor. 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   PATIENT SURVEYS:  PSFS:  - walking, 5  - heel raise, 0  - sit<>stand, 4  Total: 9; avg 3  07/12/24:   - walking 7  - heel raise 10  - sit <> stand 10  Total: 27, Avg: 9    Not calculated: ascending/descending stairs 7    COGNITION: Overall cognitive status: Within functional limits for tasks assessed     SENSATION: Light touch intact BIL LE aside from plantar aspect of BIL feet R>L per pt report   LOWER EXTREMITY MMT:    MMT Right eval Left eval R/L 07/15/24  Hip flexion 4 4 4/4+  Hip abduction (modified sitting) 5 5 5/5  Hip internal rotation     Hip external rotation     Knee flexion 4- 4 5/5  Knee extension 5 5 5/5  Ankle dorsiflexion 5 5    (Blank rows = not tested) (Key: WFL = within functional limits not formally assessed, * = concordant pain, s = stiffness/stretching sensation, NT = not tested)  Comments:     FUNCTIONAL TESTS:  5xSTS: 24.67sec w/o UE support   - 07/03/24 5xSTS 11.07 sec w/o UE support (1 instance of posterior LOB on ascent)   06/10/24: Heel raise 2 cm difference less on R than L   GAIT: FGA (no AD) on eval:  -Item 1 Gait Level Surface: mild impairment 2  -Item 2 Change in Gait Speed: moderate impairment 1   -Item 3 Gait with Horizontal Head Turns: mild impairment 2  -Item 4 Gait with Vertical Head Turns: Normal 3  -Item 5 Gait with Pivot Turn: moderate impairment 1   -Item 6 Step Over Obstacle: mild impairment 2  -Item 7 Gait with Narrow Base of Support: severe impairment 0 -Item 8 Gait with Eyes Closed: mild impairment 2             -  Item 9 Ambulating Backwards: moderate impairment 1  -Item 10 Steps: Normal 3 (per pt report but does require inc time/effort) Total: 17 /30  * Score of <=22/30 indicates that patient is at increased risk for falls.    [facility] PT Assessment - 07/15/24 0001       Functional Gait  Assessment   Gait  assessed  Yes    Gait Level Surface Walks 20 ft in less than 7 sec but greater than 5.5 sec, uses assistive device, slower speed, mild gait deviations, or deviates 6-10 in outside of the 12 in walkway width.    Change in Gait Speed Able to change speed, demonstrates mild gait deviations, deviates 6-10 in outside of the 12 in walkway width, or no gait deviations, unable to achieve a major change in velocity, or uses a change in velocity, or uses an assistive device.    Gait with Horizontal Head Turns Performs head turns smoothly with no change in gait. Deviates no more than 6 in outside 12 in walkway width    Gait with Vertical Head Turns Performs head turns with no change in gait. Deviates no more than 6 in outside 12 in walkway width.    Gait and Pivot Turn Pivot turns safely within 3 sec and stops quickly with no loss of balance.    Step Over Obstacle Is able to step over 2 stacked shoe boxes taped together (9 in total height) without changing gait speed. No evidence of imbalance.    Gait with Narrow Base of Support Ambulates 4-7 steps.    Gait with Eyes Closed Walks 20 ft, uses assistive device, slower speed, mild gait deviations, deviates 6-10 in outside 12 in walkway width. Ambulates 20 ft in less than 9 sec but greater than 7 sec.    Ambulating Backwards Walks 20 ft, uses assistive device, slower speed, mild gait deviations, deviates 6-10 in outside 12 in walkway width.    Steps Alternating feet, no rail.    Total Score 24           TREATMENT DATE:  [facility] Adult PT Treatment:                                                DATE: 07/15/2024  Therapeutic Exercise: Standing gastroc stretch x 30 Seated hamstring stretch x 30 Seated figure 4 stretch x 30 Staggered standing heel-toe raises x 20  Rechecked goals  Neuromuscular Re-ed:   Tandem stance x30 Tandem stance with head turns x30 Standing on airex feet together EO head turns x30, head nods x 30  Self Care: Machine weights  hamstring curl 45# x10 Discussed other machines that would be good to use while at the [facility] Adult PT Treatment:                                                DATE: 07/12/2024  Therapeutic Exercise: Standing heel-toe raises x 20  Squat at //bars 2 x 10 with intermittent cueing  Denies lightheadedness  Neuromuscular re-ed: Mini lunge iso hold w/ PF into bosu ; 2 x 10 each LE, UE support, cues for appropriate weight shift Tandem walking at //bars 3 laps UE support PRN  Therapeutic Activity:  PSFS assessment + education and related discharge planning  Ambulation over (gymnastic) mat x 2 laps each; with gait belt/CGA  Fwd eyes open Fwd eyes closed Backward eyes open Backward eyes closed   [facility] Adult PT Treatment:                                                DATE: 07/03/24 Therapeutic Exercise: Standing heel raise + 3 sec hold at top 3x5 Bosu tke push x12 cues for setup/form HEP update + education/handout  Neuromuscular re-ed: Bosu mini lunge 2x8 BIL cues for form, UE support PRN Mini lunge iso hold w/ PF into bosu ; 2x8 each LE, UE support, cues for appropriate weight shift Tandem walking along counter 3 laps UE support PRN  Therapeutic Activity: BW STS x8 5xSTS + education   [facility] Adult PT Treatment:                                                DATE: 07/01/24 Therapeutic Exercise: Seated 15# 3-3-3 heel raise, x12 BIL  Standing BW heel raise 2x8 at counter Standing red band (figure 8) march x12 BIL   Neuromuscular re-ed: [facility] weaving slow/fast 1 lap Red light/green light 3 laps  Agility ladder 2 in 2 out diagonals 2 laps cues for sequencing/velocity Lateral stepping red band at counter (figure 8 above knee), 5 laps cues for quick steps and posture Fwd/retro stepping 5 laps red band figure 8 cues for posture     PATIENT EDUCATION:  Education details: rationale for interventions, HEP  Person educated: Patient Education method: Programmer, multimedia, Demonstration,  Tactile cues, Verbal cues Education comprehension: verbalized understanding, returned demonstration, verbal cues required, tactile cues required, and needs further education      HOME EXERCISE PROGRAM: Access Code: [email or web address] URL: [email or web address] Date: 07/03/2024 Prepared by: Alm Jenny  Exercises - Sit to Stand with Armchair  - 2-3 x daily - 1 sets - 8 reps - Seated Hamstring Curl with Anchored Resistance  - 2-3 x daily - 1 sets - 10 reps - Heel Raises with Counter Support  - 2-3 x daily - 1 sets - 8 reps - Tandem Walking with Counter Support  - 2-3 x daily - 1 sets - 10 reps  ASSESSMENT:  CLINICAL IMPRESSION: 07/15/2024: Mr. [name] has been making good progress with PT. Rechecked his remaining goals with pt having met all of his long term goals. His FGA no longer places him at a high fall risk; however, he does continue to show deficits with foot/ankle strength and balance using a narrower BOS and compliant surfaces. Finalized HEP for pt to work on these issues at home. Discussed incorporating vestibular exercises to further improve his balance since he has neuropathy. Discussed gym equipment that may be good for him to continue using. Pt feels ready for PT d/c.    Per eval - Patient is a pleasant 78 y.o. gentleman who was seen today for physical therapy evaluation and treatment for gait disorder. He endorses gradually worsening balance/weakness over last several years which he attributes to his peripheral neuropathy. On exam he demonstrates mild reductions in focal MMT, reduced functional strength and fall risk as evidenced by 5xSTS, and fall risk as evidenced by FGA. Demonstrates  impairments in change of pace, dual tasking, and BOS challenges. No adverse events, tolerates exam and HEP well overall. Recommend trial of skilled PT to address aforementioned deficits with aim of improving functional tolerance and reducing pain with typical activities. Pt departs today's  session in no acute distress, all voiced concerns/questions addressed appropriately from PT perspective.      OBJECTIVE IMPAIRMENTS: Abnormal gait, decreased activity tolerance, decreased balance, decreased endurance, decreased mobility, difficulty walking, decreased strength, impaired perceived functional ability, and improper body mechanics.   ACTIVITY LIMITATIONS: carrying, lifting, bending, standing, stairs, transfers, and locomotion level  PARTICIPATION LIMITATIONS: meal prep, cleaning, laundry, community activity, and yard work  PERSONAL FACTORS: Age, Time since onset of injury/illness/exacerbation, and 3+ comorbidities: ascending aortic aneurysm, Afib RVR, hx chest pain, GERD, peripheral neuropathy, insomnia, CI, MRSA, OSA, TIA are also affecting patient's functional outcome.   REHAB POTENTIAL: Good  CLINICAL DECISION MAKING: Evolving/moderate complexity  EVALUATION COMPLEXITY: Moderate   GOALS:   SHORT TERM GOALS: Target date: 06/24/2024   Pt will demonstrate appropriate understanding and performance of initially prescribed HEP in order to facilitate improved independence with management of symptoms.  Baseline: HEP established  06/24/24: reports good HEP performance Goal status: MET  2. Pt will report at least 5 sec improvement in 5xSTS in order to reduce fall risk. (MCID 2.3sec)  Baseline: 24 sec no UE support 07/03/24: 11sec Goal status: MET   LONG TERM GOALS: Target date: 07/15/2024   Pt will score avg 6 or greater on PSFS in order to demonstrate improved perception of functional status due to symptoms.  Baseline: 3 avg 07/12/2024: 9 avg Goal status: MET  2.  Pt will demonstrate LE MMT of at least 4+/5 in tested groups in order to facilitate improved functional strength.  Baseline: see MMT chart above  07/15/24: see MMT  Goal status: MET  3.  Pt will score greater than or equal to 22/30 on Functional Gait assessment in order to indicate reduced fall risk (cutoff score  </= 22/30 predictive of falls per Willye et al 2010, MCID 4 pts Beninato et al 2014)  Baseline: 17/30  07/15/24: 23/30  Goal status: MET  4. Pt will perform 5xSTS in </=14 sec in order to demonstrate reduced fall risk and improved functional independence. (MCID of 2.3sec)  Baseline: 24 sec no UE support  07/03/24: 11sec   Goal status: MET   5. Pt will demonstrate appropriate performance of final prescribed HEP in order to facilitate improved self-management of symptoms post-discharge.   Baseline: initial HEP prescribed  07/03/24: reports good HEP adherence  Goal status: MET  PLAN:  PT FREQUENCY: 1-2x/week  PT DURATION: 6 weeks  PLANNED INTERVENTIONS: [id number]- PT Re-evaluation, 97750- Physical Performance Testing, 97110-Therapeutic exercises, 97530- Therapeutic activity, 97112- Neuromuscular re-education, 97535- Self Care, [id number]- Manual therapy, [phone]- Gait training, Patient/Family education, Balance training, Stair training, Taping, Joint mobilization, Spinal mobilization, Cryotherapy, and Moist heat.  PLAN FOR NEXT SESSION: Review/update HEP PRN. mindful of cardiac history.  focus on dynamic balance/ambulation and promote independent with exercises for return to [facility] following d/c from PT    Liane Tribbey April Ma L Rathana Viveros, PT, DPT  07/15/2024 2:43 PM

## 2024-07-17 ENCOUNTER — Ambulatory Visit: Admitting: Physical Therapy

## 2024-07-22 ENCOUNTER — Ambulatory Visit (INDEPENDENT_AMBULATORY_CARE_PROVIDER_SITE_OTHER): Admitting: Internal Medicine

## 2024-07-22 ENCOUNTER — Encounter: Payer: Self-pay | Admitting: Internal Medicine

## 2024-07-22 VITALS — BP 100/62 | HR 66 | Temp 97.8°F | Ht 77.0 in | Wt 230.0 lb

## 2024-07-22 DIAGNOSIS — E7800 Pure hypercholesterolemia, unspecified: Secondary | ICD-10-CM

## 2024-07-22 DIAGNOSIS — R269 Unspecified abnormalities of gait and mobility: Secondary | ICD-10-CM

## 2024-07-22 DIAGNOSIS — I7121 Aneurysm of the ascending aorta, without rupture: Secondary | ICD-10-CM

## 2024-07-22 DIAGNOSIS — G300 Alzheimer's disease with early onset: Secondary | ICD-10-CM

## 2024-07-22 DIAGNOSIS — R739 Hyperglycemia, unspecified: Secondary | ICD-10-CM

## 2024-07-22 DIAGNOSIS — I951 Orthostatic hypotension: Secondary | ICD-10-CM | POA: Diagnosis not present

## 2024-07-22 DIAGNOSIS — E559 Vitamin D deficiency, unspecified: Secondary | ICD-10-CM

## 2024-07-22 DIAGNOSIS — N400 Enlarged prostate without lower urinary tract symptoms: Secondary | ICD-10-CM

## 2024-07-22 DIAGNOSIS — E538 Deficiency of other specified B group vitamins: Secondary | ICD-10-CM | POA: Diagnosis not present

## 2024-07-22 DIAGNOSIS — N320 Bladder-neck obstruction: Secondary | ICD-10-CM

## 2024-07-22 DIAGNOSIS — Z125 Encounter for screening for malignant neoplasm of prostate: Secondary | ICD-10-CM

## 2024-07-22 DIAGNOSIS — F02A0 Dementia in other diseases classified elsewhere, mild, without behavioral disturbance, psychotic disturbance, mood disturbance, and anxiety: Secondary | ICD-10-CM

## 2024-07-22 DIAGNOSIS — I10 Essential (primary) hypertension: Secondary | ICD-10-CM

## 2024-07-22 MED ORDER — TADALAFIL 5 MG PO TABS: 5.0000 mg | ORAL_TABLET | Freq: Every day | ORAL | 3 refills | Status: DC

## 2024-07-22 NOTE — Patient Instructions (Signed)
 Please continue all other medications as before, except ok to Change the Flomax  to Cialis  5 mg for the prostate to see if the dizziness improved  Please have the pharmacy call with any other refills you may need.  Please continue your efforts at being more active, low cholesterol diet, and weight control.  You are otherwise up to date with prevention measures today.  Please keep your appointments with your specialists as you may have planned  You will be contacted regarding the referral for: cardiology  Please go to the LAB at the blood drawing area for the tests to be done  You will be contacted by phone if any changes need to be made immediately.  Otherwise, you will receive a letter about your results with an explanation, but please check with MyChart first.  Please make an Appointment to return in 6 months, or sooner if needed

## 2024-07-22 NOTE — Assessment & Plan Note (Signed)
 For lab today, but suspect physiologic vs CV - for refer cardiology as above

## 2024-07-22 NOTE — Progress Notes (Signed)
 Patient ID: Dustin [name] Jewel Mickey., male   DOB: 11-01-1946, 78 y.o.   MRN: [id number]        Chief Complaint: follow up orthostasis, BPH, HTN, gait disorder, demenita, low vit d       HPI:  Dustin Prowse. is a 78 y.o. male here with c/o dizizness with standing up, as well as after bending forward then sitting up straight.   Has hx of 5.3 cm aortic aneurysm and mild to mod MR followed per cardiothoracic, but not clear about his primary cardiologist since Dr Burnard retired.  Pt denies chest pain, increased sob or doe, wheezing, orthopnea, PND, increased LE swelling, palpitations, or syncope.   Due for MRI cardiac/MRI chest about may 2026  Has been tolerating all other meds well.  States drinks plenty of fluids daily.  BP low normal here today, but BP at home is rarely sbp 100, usually 110 - 120.  Does not take diuretic.  Denies urinary symptoms such as dysuria, frequency, urgency, flank pain, hematuria or n/v, fever, chills.  Did finish a course of PT for gait difficulty recently, No falls.  No fever. He feels his memory is little changed with taking the leqembi .  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         Wt Readings from Last 3 Encounters:  07/22/24 230 lb (104.3 kg)  06/12/24 234 lb (106.1 kg)  05/22/24 233 lb (105.7 kg)   BP Readings from Last 3 Encounters:  07/22/24 100/62  06/12/24 124/82  06/05/24 112/67         Past Medical History:  Diagnosis Date   Abdominal pain 04/01/2013   Acute bronchitis 03/07/2018   Allergic rhinitis 05/12/2008   Aortic atherosclerosis 08/23/2020   Arthritis    Ascending aortic aneurysm 12/13/2018   Atrial fibrillation with RVR 10/21/2012   Echo- EF 50-55%; mild concentric LVH; flow pattern suggestive of impaired LV relaxation; mild mitral valve prolapse, trace mitral regurgitation   Back pain    receiving PT   Benign prostatic hyperplasia 09/05/2012   Boil of buttock 06/24/2011   Bradycardia  04/17/2013   Cataract    Cellulitis of knee, left 04/12/2016   Chest pain with exertion presumed to be tachycardia related along with SOB 04/01/2013   Degeneration of lumbar intervertebral disc 12/10/2020   Eustachian tube dysfunction, bilateral 03/07/2018   External otitis of left ear 06/11/2020   Fatigue 07/04/2011   GERD (gastroesophageal reflux disease) 03/24/2009   Heart murmur    Hematuria, possible 04/01/2013   Hereditary and idiopathic peripheral neuropathy 05/12/2008   HLD (hyperlipidemia) 08/18/2007   HTN (hypertension) 12/04/2013   Insomnia 06/08/2016   Left knee pain 12/13/2018   Low back pain 12/01/2020   Lumbar spondylosis 05/05/2021   Mild cognitive impairment of uncertain or unknown etiology 12/09/2022   Mitral valve prolapse 07/04/2011   MRSA (methicillin resistant staph aureus) culture positive 5+ years ago   Neck pain 05/05/2021   OSA (obstructive sleep apnea) 07/04/2011   Using CPAP nightly   Pain in right hand 02/04/2020   Palpitations 03/06/2007   R/P MV - mild perfusion defect in basal inferoseptal, basal inferior, mid inferoseptal, and mid inferior regions, consistent w/ infarct/scar; no scintigraphic evidence of inducible myocardial ischemia; prior non transmural infarct cannot be completely excluded; EF 48%; no significant change from previous study   Personal history of colonic polyps 05/12/2008   Radicular pain in  left arm 05/05/2021   Sacroiliitis 05/05/2021   Stricture and stenosis of esophagus 03/24/2009   TIA (transient ischemic attack) 11/22/2021   Tinnitus 09/05/2012   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   Vitamin B12 deficiency 06/15/2019   Vitamin D  deficiency 05/23/2022   Past Surgical History:  Procedure Laterality Date   ATRIAL FIBRILLATION ABLATION     CARDIOVERSION N/A 04/03/2013   Procedure: CARDIOVERSION;  Surgeon: Vinie KYM Maxcy, MD;  Location: [facility] ENDOSCOPY;  Service: Cardiovascular;  Laterality: N/A;   COLONOSCOPY  2018   HAND SURGERY      Thumb joint repair   POLYPECTOMY     TEE WITHOUT CARDIOVERSION N/A 04/03/2013   Procedure: TRANSESOPHAGEAL ECHOCARDIOGRAM (TEE);  Surgeon: Vinie KYM Maxcy, MD;  Location: [facility] ENDOSCOPY;  Service: Cardiovascular;  Laterality: N/A;   TONSILLECTOMY AND ADENOIDECTOMY     UPPER GASTROINTESTINAL ENDOSCOPY     dilation    reports that he has never smoked. He has never used smokeless tobacco. He reports that he does not currently use alcohol. He reports that he does not use drugs. family history includes Alzheimer's disease in his mother; Breast cancer in his maternal grandmother; Dementia in his mother; Heart disease (age of onset: 8) in his father; Melanoma in his sister; Stroke in his maternal grandfather. Allergies  Allergen Reactions   Penicillin G Rash   Quinolones Other (See Comments)    Other reaction(s): Other (See Comments) Fluroquinolone antibiotics should be avoided in patients with history of aortic aneurysm/dissection   Amoxil [Amoxicillin] Rash   Current Outpatient Medications on File Prior to Visit  Medication Sig Dispense Refill   aspirin  81 MG EC tablet Take 1 tablet (81 mg total) by mouth daily. Swallow whole. 30 tablet 12   atorvastatin  (LIPITOR) 80 MG tablet Take 1 tablet (80 mg total) by mouth daily. 90 tablet 3   cyanocobalamin  (VITAMIN B12) 100 MCG tablet Take 100 mcg by mouth daily.     Lecanemab -irmb (LEQEMBI ) 200 MG/2ML SOLN Inject 10 mg/kg into the vein every 14 (fourteen) days.     lisinopril  (ZESTRIL ) 5 MG tablet Take 1 tablet (5 mg total) by mouth daily. 90 tablet 3   meloxicam (MOBIC) 15 MG tablet Take 15 mg by mouth as needed for pain. TAKE 0.5-1 tablet  AS NEEDED     metoprolol  succinate (TOPROL -XL) 25 MG 24 hr tablet Take 2 tablets (50 mg total) by mouth every morning AND 0.5 tablets (12.5 mg total) every evening. 225 tablet 3   rivastigmine  (EXELON ) 3 MG capsule Take 1 capsule (3 mg total) by mouth 2 (two) times daily. 180 capsule 3   tamsulosin  (FLOMAX )  0.4 MG CAPS capsule Take 0.4 mg by mouth daily.     VITAMIN D  PO Take 1 tablet by mouth daily in the afternoon.     zolpidem  (AMBIEN ) 5 MG tablet TAKE 1 TABLET BY MOUTH AT BEDTIME AS NEEDED FOR SLEEP. 30 tablet 0   No current facility-administered medications on file prior to visit.        ROS:  All others reviewed and negative.  Objective        PE:  BP 100/62   Pulse 66   Temp 97.8 F (36.6 C) (Temporal)   Ht 6' 5 (1.956 m)   Wt 230 lb (104.3 kg)   SpO2 97%   BMI 27.27 kg/m                 Constitutional: Pt appears in NAD  HENT: Head: NCAT.                Right Ear: External ear normal.                 Left Ear: External ear normal.                Eyes: . Pupils are equal, round, and reactive to light. Conjunctivae and EOM are normal               Nose: without d/c or deformity               Neck: Neck supple. Gross normal ROM               Cardiovascular: Normal rate and regular rhythm.                 Pulmonary/Chest: Effort normal and breath sounds without rales or wheezing.                Abd:  Soft, NT, ND, + BS, no organomegaly               Neurological: Pt is alert. At baseline orientation, motor grossly intact               Skin: Skin is warm. No rashes, no other new lesions, LE edema - none               Psychiatric: Pt behavior is normal without agitation   Micro: none  Cardiac tracings I have personally interpreted today:  none  Pertinent Radiological findings (summarize): none   Lab Results  Component Value Date   WBC 5.5 01/22/2024   HGB 15.4 01/22/2024   HCT 46.0 01/22/2024   PLT 210.0 01/22/2024   GLUCOSE 90 01/22/2024   CHOL 94 11/22/2023   TRIG 79.0 11/22/2023   HDL 40.60 11/22/2023   LDLCALC 38 11/22/2023   ALT 25 01/22/2024   AST 23 01/22/2024   NA 144 01/22/2024   K 4.2 01/22/2024   CL 108 01/22/2024   CREATININE 0.97 01/22/2024   BUN 15 01/22/2024   CO2 29 01/22/2024   TSH 2.07 11/22/2023   PSA 1.12 11/22/2023   INR  1.19 04/01/2013   HGBA1C 5.7 11/22/2023   Assessment/Plan:  Dustin Robles. is a 78 y.o. White or Caucasian [1] male with  has a past medical history of Abdominal pain (04/01/2013), Acute bronchitis (03/07/2018), Allergic rhinitis (05/12/2008), Aortic atherosclerosis (08/23/2020), Arthritis, Ascending aortic aneurysm (12/13/2018), Atrial fibrillation with RVR (10/21/2012), Back pain, Benign prostatic hyperplasia (09/05/2012), Boil of buttock (06/24/2011), Bradycardia (04/17/2013), Cataract, Cellulitis of knee, left (04/12/2016), Chest pain with exertion presumed to be tachycardia related along with SOB (04/01/2013), Degeneration of lumbar intervertebral disc (12/10/2020), Eustachian tube dysfunction, bilateral (03/07/2018), External otitis of left ear (06/11/2020), Fatigue (07/04/2011), GERD (gastroesophageal reflux disease) (03/24/2009), Heart murmur, Hematuria, possible (04/01/2013), Hereditary and idiopathic peripheral neuropathy (05/12/2008), HLD (hyperlipidemia) (08/18/2007), HTN (hypertension) (12/04/2013), Insomnia (06/08/2016), Left knee pain (12/13/2018), Low back pain (12/01/2020), Lumbar spondylosis (05/05/2021), Mild cognitive impairment of uncertain or unknown etiology (12/09/2022), Mitral valve prolapse (07/04/2011), MRSA (methicillin resistant staph aureus) culture positive (5+ years ago), Neck pain (05/05/2021), OSA (obstructive sleep apnea) (07/04/2011), Pain in right hand (02/04/2020), Palpitations (03/06/2007), Personal history of colonic polyps (05/12/2008), Radicular pain in left arm (05/05/2021), Sacroiliitis (05/05/2021), Stricture and stenosis of esophagus (03/24/2009), TIA (transient ischemic attack) (11/22/2021), Tinnitus (09/05/2012), Tuberous sclerosis (06/08/2016), Vitamin B12 deficiency (06/15/2019), and Vitamin D  deficiency (05/23/2022).  Ascending aortic aneurysm Pt should f/u with primary cardiology - will refer to new.    Alzheimer dementia (HCC) Stable subjectively,   to f/u any worsening symptoms or concerns, f/u neurology as planned  Vitamin D  deficiency Last vitamin D  Lab Results  Component Value Date   VD25OH 37.25 11/22/2023   Low, to start oral replacement   Vitamin B12 deficiency Lab Results  Component Value Date   VITAMINB12 1,179 (H) 11/22/2023   Stable, cont oral replacement - b12 1000 mcg qd   HTN (hypertension) Low normal, but on minimal meds - to continue lsinopril 5 mg every day, and metoprolol  XL asd  HLD (hyperlipidemia) Lab Results  Component Value Date   LDLCALC 38 11/22/2023   Stable, pt to continue current statin lipitor 80 mg qd   Benign prostatic hyperplasia Ok to change flomax  to cialis  5 mg daily to see if helps with orthostasis.  Gait disorder Improved s/p PT course recently  Orthostasis For lab today, but suspect physiologic vs CV - for refer cardiology as above  Followup: Return in about 6 months (around 01/22/2025).  Dustin Rush, MD 07/22/2024 12:59 PM [facility]  Primary Care - [facility] Internal Medicine

## 2024-07-22 NOTE — Assessment & Plan Note (Signed)
 Low normal, but on minimal meds - to continue lsinopril 5 mg every day, and metoprolol  XL asd

## 2024-07-22 NOTE — Assessment & Plan Note (Signed)
 Improved s/p PT course recently

## 2024-07-22 NOTE — Assessment & Plan Note (Signed)
 Lab Results  Component Value Date   VITAMINB12 1,179 (H) 11/22/2023   Stable, cont oral replacement - b12 1000 mcg qd

## 2024-07-22 NOTE — Assessment & Plan Note (Signed)
 Last vitamin D Lab Results  Component Value Date   VD25OH 37.25 11/22/2023   Low, to start oral replacement

## 2024-07-22 NOTE — Assessment & Plan Note (Signed)
 Pt should f/u with primary cardiology - will refer to new.

## 2024-07-22 NOTE — Assessment & Plan Note (Signed)
 Ok to change flomax  to cialis  5 mg daily to see if helps with orthostasis.

## 2024-07-22 NOTE — Assessment & Plan Note (Signed)
 Lab Results  Component Value Date   LDLCALC 38 11/22/2023   Stable, pt to continue current statin lipitor 80 mg qd

## 2024-07-22 NOTE — Assessment & Plan Note (Signed)
 Stable subjectively,  to f/u any worsening symptoms or concerns, f/u neurology as planned

## 2024-07-23 ENCOUNTER — Telehealth: Payer: Self-pay

## 2024-07-23 ENCOUNTER — Other Ambulatory Visit (HOSPITAL_COMMUNITY): Payer: Self-pay

## 2024-07-23 NOTE — Telephone Encounter (Signed)
 Pharmacy Patient Advocate Encounter   Received notification from CoverMyMeds that prior authorization for Tadalafil  5MG  tablets is required/requested.   Insurance verification completed.   The patient is insured through [facility] MEDICARE .   Per test claim: PA required; PA submitted to above mentioned insurance via Latent Key/confirmation #/EOC [id number] Status is pending

## 2024-07-23 NOTE — Telephone Encounter (Signed)
 Pharmacy Patient Advocate Encounter  Received notification from [facility] that Prior Authorization for Tadalafil  5MG  tablets  has been APPROVED from 07/23/24 to 12/04/2098   PA #/Case ID/Reference #: [id number]

## 2024-07-24 ENCOUNTER — Ambulatory Visit (INDEPENDENT_AMBULATORY_CARE_PROVIDER_SITE_OTHER): Admitting: Sports Medicine

## 2024-07-24 VITALS — BP 120/70 | HR 70 | Ht 77.0 in

## 2024-07-24 DIAGNOSIS — M5441 Lumbago with sciatica, right side: Secondary | ICD-10-CM

## 2024-07-24 DIAGNOSIS — M25532 Pain in left wrist: Secondary | ICD-10-CM

## 2024-07-24 DIAGNOSIS — M5442 Lumbago with sciatica, left side: Secondary | ICD-10-CM

## 2024-07-24 DIAGNOSIS — M51362 Other intervertebral disc degeneration, lumbar region with discogenic back pain and lower extremity pain: Secondary | ICD-10-CM

## 2024-07-24 DIAGNOSIS — M7702 Medial epicondylitis, left elbow: Secondary | ICD-10-CM | POA: Diagnosis not present

## 2024-07-24 DIAGNOSIS — M25522 Pain in left elbow: Secondary | ICD-10-CM | POA: Diagnosis not present

## 2024-07-24 DIAGNOSIS — G8929 Other chronic pain: Secondary | ICD-10-CM

## 2024-07-24 NOTE — Patient Instructions (Addendum)
 Good to see you  Epidural ordered call  imaging at [phone] to schedule  Continue Physical therapy Follow up 2 weeks after epidural

## 2024-07-24 NOTE — Progress Notes (Signed)
 Ben Dorean Hiebert D.[facility] [address] [place] [address] Phone: [phone]   Assessment and Plan:     1. Degeneration of intervertebral disc of lumbar region with discogenic back pain and lower extremity pain 2. Chronic bilateral low back pain with bilateral sciatica -Chronic with exacerbation, subsequent visit - Continued low back pain with bilateral radicular symptoms including muscular fatigue, numbness, tingling, balance issues.  Consistent with lumbar DDD and lumbar radiculopathy - Reviewed lumbar MRI from 06/29/2024 which showed lumbar spondylosis, degenerative disc disease most prominent at L4-L5, but also moderate throughout remaining lumbar spine - Recommend epidural CSI to right sided L4-L5 - Use Tylenol  500 to 1000 mg tablets 2-3 times a day for day-to-day pain relief - Use meloxicam 15 mg daily as needed for pain.  Recommend limiting chronic NSAIDs to 1-2 doses per week to prevent long-term side effects.   3. Left elbow pain 4. Left wrist pain 5. Medial epicondylitis of elbow, left  -Chronic with exacerbation, subsequent visit - Overall improvement with HEP, physical therapy, intermittent meloxicam use.  Most consistent with chronic medial epicondylitis and pain from arthritic changes - Continue HEP and physical therapy - Use Tylenol  500 to 1000 mg tablets 2-3 times a day for day-to-day pain relief - Use meloxicam 15 mg daily as needed for pain.  Recommend limiting chronic NSAIDs to 1-2 doses per week to prevent long-term side effects.   Pertinent previous records reviewed include lumbar MRI 06/29/24  Follow Up: 2 weeks after epidural to review benefit and discuss treatment plan   Subjective:    Chief Complaint: Low back pain   HPI:   06/12/2024  Patient is a 78 year old male with left elbow and wrist pain. Patient states has had intermittent for a year. Decreased ROM due to pain. Decrease in grip strength. No MOI. Meloxicam  helps with wrist no other body parts. Hx of surgery bilat hands. No numbness and tingling. No radiating pain up the arm, just down the elbow to the wrist. Pain is always there.  07/24/24 Patient is here today for a follow up to discuss MRI results of low back. Patient states feeling the same. Patient states he did start having neck pain that started a couple weeks ago usually sees chiropractor and has not recently. Patient did get the brace for tennis elbow for lifting weights, hands are about the same. Patient has been doing PT for about 6 weeks and that has been helping.   Relevant Historical Information:  History of A-fib with RVR not on chronic anticoagulation, hypertension, history of TIA, dementia   Additional pertinent review of systems negative.   Current Outpatient Medications:    aspirin  81 MG EC tablet, Take 1 tablet (81 mg total) by mouth daily. Swallow whole., Disp: 30 tablet, Rfl: 12   atorvastatin  (LIPITOR) 80 MG tablet, Take 1 tablet (80 mg total) by mouth daily., Disp: 90 tablet, Rfl: 3   cyanocobalamin  (VITAMIN B12) 100 MCG tablet, Take 100 mcg by mouth daily., Disp: , Rfl:    Lecanemab -irmb (LEQEMBI ) 200 MG/2ML SOLN, Inject 10 mg/kg into the vein every 14 (fourteen) days., Disp: , Rfl:    lisinopril  (ZESTRIL ) 5 MG tablet, Take 1 tablet (5 mg total) by mouth daily., Disp: 90 tablet, Rfl: 3   meloxicam (MOBIC) 15 MG tablet, Take 15 mg by mouth as needed for pain. TAKE 0.5-1 tablet  AS NEEDED, Disp: , Rfl:    metoprolol  succinate (TOPROL -XL) 25 MG 24 hr  tablet, Take 2 tablets (50 mg total) by mouth every morning AND 0.5 tablets (12.5 mg total) every evening., Disp: 225 tablet, Rfl: 3   rivastigmine  (EXELON ) 3 MG capsule, Take 1 capsule (3 mg total) by mouth 2 (two) times daily., Disp: 180 capsule, Rfl: 3   tadalafil  (CIALIS ) 5 MG tablet, Take 1 tablet (5 mg total) by mouth daily., Disp: 90 tablet, Rfl: 3   tamsulosin  (FLOMAX ) 0.4 MG CAPS capsule, Take 0.4 mg by mouth daily., Disp:  , Rfl:    VITAMIN D  PO, Take 1 tablet by mouth daily in the afternoon., Disp: , Rfl:    zolpidem  (AMBIEN ) 5 MG tablet, TAKE 1 TABLET BY MOUTH AT BEDTIME AS NEEDED FOR SLEEP., Disp: 30 tablet, Rfl: 0   Objective:     Vitals:   07/24/24 1338  BP: 120/70  Pulse: 70  SpO2: 97%  Height: 6' 5 (1.956 m)      Body mass index is 27.27 kg/m.    Physical Exam:     Left elbow: No deformity, swelling or muscle wasting Normal Carrying angle ROM:0-140, supination and pronation 90 TTP mildly medial epicondyle, CMC NTTP over triceps, ticeps tendon, olecranon, lat epicondyle,   antecubital fossa, biceps tendon, supinator, pronator Negative tinnels over cubital tunnel No pain with resisted wrist and middle digit extension No pain with resisted wrist flexion No pain with resisted supination Mild pain with resisted pronation Negative valgus stress Negative varus stress Negative milking maneuver    Gen: Appears well, nad, nontoxic and pleasant Psych: Alert and oriented, appropriate mood and affect Neuro: sensation intact, strength is 4/5 in upper and lower extremities, muscle tone wnl Skin: no susupicious lesions or rashes   Back - Normal skin, Spine with normal alignment and no deformity.   tenderness to vertebral process palpation.   Paraspinous muscles are tender and without spasm Trendelenberg positive bilaterally  Gait antalgic with slow and short steps    Electronically signed by:  Odis Mace D.[facility] 1:57 PM 07/24/24

## 2024-07-25 ENCOUNTER — Other Ambulatory Visit (HOSPITAL_COMMUNITY): Payer: Self-pay

## 2024-07-26 ENCOUNTER — Telehealth: Payer: Self-pay | Admitting: Cardiovascular Disease

## 2024-07-26 NOTE — Telephone Encounter (Signed)
 Returned patient call and patient verbalized he was calling to find out who his provider will be since Dr. Burnard retired. Patient reports having  symptoms for past 2-3 months. Patient reports symptoms when rising from sitting position  to standing. He reports he become dizzy and lightheaded on and off, but symptoms resided immediately. Patient reports he has been taking his blood pressure but unable to provide readings. Per patient he takes all of his medications at night. Educated patient on taking medications as ordered.Appointment made  for patient with APP. Advised patient to follow up with neurology. Patient  advised to go to Emergency Room if symptoms resume. Understanding verbalized.

## 2024-07-26 NOTE — Telephone Encounter (Signed)
 Patient c/o Palpitations:  STAT if patient reporting lightheadedness, shortness of breath, or chest pain  How long have you had palpitations/irregular HR/ Afib? Are you having the symptoms now?   No  Are you currently experiencing lightheadedness, SOB or CP?   Dizziness  Do you have a history of afib (atrial fibrillation) or irregular heart rhythm?   Yes  Have you checked your BP or HR? (document readings if available):   Patient stated his BP in the 120's  Are you experiencing any other symptoms?   See below     STAT if patient feels like he/she is going to faint   1. Are you feeling dizzy, lightheaded, or faint right now?   No - patient stated he is sitting    2. Have you passed out?    No (If yes move to .SYNCOPECHMG)  3. Do you have any other symptoms?   Irregular heartbeat  4. Have you checked your HR and BP (record if available)?   Not available   Patient stated he has been having irregular heartbeats for the past month.  Patient stated he has been having dizziness on standing.  Patient noted he has not passed out or fallen.

## 2024-07-30 ENCOUNTER — Ambulatory Visit
Admission: RE | Admit: 2024-07-30 | Discharge: 2024-07-30 | Disposition: A | Discharge status: Home / Self Care | Source: Ambulatory Visit | Attending: Sports Medicine | Admitting: Sports Medicine

## 2024-07-30 DIAGNOSIS — G8929 Other chronic pain: Secondary | ICD-10-CM

## 2024-07-30 DIAGNOSIS — M51362 Other intervertebral disc degeneration, lumbar region with discogenic back pain and lower extremity pain: Secondary | ICD-10-CM

## 2024-07-30 MED ORDER — METHYLPREDNISOLONE ACETATE 40 MG/ML INJ SUSP (RADIOLOG
80.0000 mg | Freq: Once | INTRAMUSCULAR | Status: AC
  Administered 2024-07-30: 80 mg via EPIDURAL

## 2024-07-30 MED ORDER — IOPAMIDOL (ISOVUE-M 200) INJECTION 41%
1.0000 mL | Freq: Once | INTRAMUSCULAR | Status: AC
  Administered 2024-07-30: 1 mL via EPIDURAL

## 2024-07-30 NOTE — Discharge Instructions (Signed)

## 2024-08-09 ENCOUNTER — Encounter (HOSPITAL_COMMUNITY): Payer: Self-pay | Admitting: Physician Assistant

## 2024-08-09 ENCOUNTER — Other Ambulatory Visit: Payer: Self-pay | Admitting: Internal Medicine

## 2024-08-09 ENCOUNTER — Ambulatory Visit (HOSPITAL_COMMUNITY)
Admission: RE | Admit: 2024-08-09 | Discharge: 2024-08-09 | Disposition: A | Discharge status: Home / Self Care | Source: Ambulatory Visit | Attending: Physician Assistant | Admitting: Physician Assistant

## 2024-08-09 ENCOUNTER — Inpatient Hospital Stay (HOSPITAL_COMMUNITY)
Admission: RE | Admit: 2024-08-09 | Discharge: 2024-08-09 | Disposition: A | Discharge status: Home / Self Care | Source: Ambulatory Visit | Attending: Physician Assistant | Admitting: Physician Assistant

## 2024-08-09 ENCOUNTER — Other Ambulatory Visit: Payer: Self-pay | Admitting: Nurse Practitioner

## 2024-08-09 VITALS — BP 128/90 | HR 69 | Ht 77.0 in | Wt 229.2 lb

## 2024-08-09 DIAGNOSIS — I480 Paroxysmal atrial fibrillation: Secondary | ICD-10-CM

## 2024-08-09 DIAGNOSIS — D6869 Other thrombophilia: Secondary | ICD-10-CM | POA: Insufficient documentation

## 2024-08-09 MED ORDER — METOPROLOL SUCCINATE ER 25 MG PO TB24: ORAL_TABLET | ORAL | 0 refills | Status: DC

## 2024-08-09 NOTE — Progress Notes (Signed)
 Primary Care Physician: Norleen Lynwood ORN, MD Primary Cardiologist: Debby Sor, MD (Inactive) Electrophysiologist: None  Referring Physician: [facility] triage    Dustin Berry. is a 78 y.o. male with a history of HTN, HLD, TIA, CHF, OSA, aortic root aneurysm, Alzheimer's dementia, atrial fibrillation who presents for follow up in the [facility].  The patient was initially diagnosed with atrial fibrillation remotely and had a PVI and CTI at [facility] in 2014. He has been on flecainide and propafenone in the past. His anticoagulation was discontinued post ablation.     Patient presents today for follow up for atrial fibrillation. He reports that over the past 2 weeks he has noticed much more palpitations. He describes them as skipped beats occurring once every 3-5 beats. He also had an episode of tachypalpitations lasting 15 minutes with HR in the 120's bpm. This is the first extended episodes of palpitations since his ablation in 2014. There were no specific triggers that he could identify.   Today, he denies symptoms of chest pain, shortness of breath, orthopnea, PND, lower extremity edema, dizziness, presyncope, syncope, bleeding, or neurologic sequela. The patient is tolerating medications without difficulties and is otherwise without complaint today.    Atrial Fibrillation Risk Factors:  he does have symptoms or diagnosis of sleep apnea. he does not have a history of rheumatic fever. he does not have a history of alcohol use.   Atrial Fibrillation Management history:  Previous antiarrhythmic drugs: flecainide, propafenone Previous cardioversions: 2014 Previous ablations: PVI and CTI 2014 at [facility] Anticoagulation history: Eliquis   ROS- All systems are reviewed and negative except as per the HPI above.  Past Medical History:  Diagnosis Date   Abdominal pain 04/01/2013   Acute bronchitis 03/07/2018   Allergic rhinitis 05/12/2008   Aortic  atherosclerosis 08/23/2020   Arthritis    Ascending aortic aneurysm 12/13/2018   Atrial fibrillation with RVR 10/21/2012   Echo- EF 50-55%; mild concentric LVH; flow pattern suggestive of impaired LV relaxation; mild mitral valve prolapse, trace mitral regurgitation   Back pain    receiving PT   Benign prostatic hyperplasia 09/05/2012   Boil of buttock 06/24/2011   Bradycardia 04/17/2013   Cataract    Cellulitis of knee, left 04/12/2016   Chest pain with exertion presumed to be tachycardia related along with SOB 04/01/2013   Degeneration of lumbar intervertebral disc 12/10/2020   Eustachian tube dysfunction, bilateral 03/07/2018   External otitis of left ear 06/11/2020   Fatigue 07/04/2011   GERD (gastroesophageal reflux disease) 03/24/2009   Heart murmur    Hematuria, possible 04/01/2013   Hereditary and idiopathic peripheral neuropathy 05/12/2008   HLD (hyperlipidemia) 08/18/2007   HTN (hypertension) 12/04/2013   Insomnia 06/08/2016   Left knee pain 12/13/2018   Low back pain 12/01/2020   Lumbar spondylosis 05/05/2021   Mild cognitive impairment of uncertain or unknown etiology 12/09/2022   Mitral valve prolapse 07/04/2011   MRSA (methicillin resistant staph aureus) culture positive 5+ years ago   Neck pain 05/05/2021   OSA (obstructive sleep apnea) 07/04/2011   Using CPAP nightly   Pain in right hand 02/04/2020   Palpitations 03/06/2007   R/P MV - mild perfusion defect in basal inferoseptal, basal inferior, mid inferoseptal, and mid inferior regions, consistent w/ infarct/scar; no scintigraphic evidence of inducible myocardial ischemia; prior non transmural infarct cannot be completely excluded; EF 48%; no significant change from previous study   Personal history of colonic polyps 05/12/2008   Radicular  pain in left arm 05/05/2021   Sacroiliitis 05/05/2021   Stricture and stenosis of esophagus 03/24/2009   TIA (transient ischemic attack) 11/22/2021   Tinnitus 09/05/2012    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   Vitamin B12 deficiency 06/15/2019   Vitamin D  deficiency 05/23/2022    Current Outpatient Medications  Medication Sig Dispense Refill   aspirin  81 MG EC tablet Take 1 tablet (81 mg total) by mouth daily. Swallow whole. 30 tablet 12   atorvastatin  (LIPITOR) 80 MG tablet Take 1 tablet (80 mg total) by mouth daily. 90 tablet 3   cyanocobalamin  (VITAMIN B12) 100 MCG tablet Take 100 mcg by mouth daily.     Lecanemab -irmb (LEQEMBI ) 200 MG/2ML SOLN Inject 10 mg/kg into the vein every 14 (fourteen) days.     lisinopril  (ZESTRIL ) 5 MG tablet Take 1 tablet (5 mg total) by mouth daily. 90 tablet 3   meloxicam (MOBIC) 15 MG tablet Take 15 mg by mouth as needed for pain. TAKE 0.5-1 tablet  AS NEEDED     metoprolol  succinate (TOPROL -XL) 25 MG 24 hr tablet Take 2 tablets (50 mg total) by mouth every morning AND 0.5 tablets (12.5 mg total) every evening. 225 tablet 3   rivastigmine  (EXELON ) 3 MG capsule Take 1 capsule (3 mg total) by mouth 2 (two) times daily. 180 capsule 3   tadalafil  (CIALIS ) 5 MG tablet Take 1 tablet (5 mg total) by mouth daily. 90 tablet 3   tamsulosin  (FLOMAX ) 0.4 MG CAPS capsule Take 0.4 mg by mouth daily.     VITAMIN D  PO Take 1 tablet by mouth daily in the afternoon.     zolpidem  (AMBIEN ) 5 MG tablet TAKE 1 TABLET BY MOUTH AT BEDTIME AS NEEDED FOR SLEEP. 30 tablet 0   No current facility-administered medications for this encounter.    Physical Exam: BP (!) 128/90   Pulse 69   Ht 6' 5 (1.956 m)   Wt 104 kg   BMI 27.18 kg/m   GEN: Well nourished, well developed in no acute distress CARDIAC: Regular rate and rhythm, no murmurs, rubs, gallops RESPIRATORY:  Clear to auscultation without rales, wheezing or rhonchi  ABDOMEN: Soft, non-tender, non-distended EXTREMITIES:  No edema; No deformity   Wt Readings from Last 3 Encounters:  08/09/24 104 kg  07/22/24 104.3 kg  06/12/24 106.1 kg     EKG today demonstrates  SR, NST Vent. rate 69  BPM PR interval 196 ms QRS duration 90 ms QT/QTcB 386/413 ms   Echo 06/28/21 demonstrated   1. Aortic dilatation noted. Aneurysm of the aortic root with  moderate-severe dilation, measuring 51 mm. There is mild dilatation of the  ascending aorta, measuring 43 mm.   2. Left ventricular ejection fraction by 3D volume is 46 %. The left  ventricle has mildly decreased function. The left ventricle demonstrates  global hypokinesis. Left ventricular diastolic parameters are consistent  with Grade I diastolic dysfunction  (impaired relaxation). The average left ventricular global longitudinal  strain is -17.9 %, borderline normal.   3. Right ventricular systolic function is normal. The right ventricular  size is normal.   4. Mild bileaflet mitral valve prolapse.. The mitral valve is abnormal.  Mild to moderate mitral valve regurgitation. No evidence of mitral  stenosis.   5. The aortic valve is grossly normal. Aortic valve regurgitation is  mild. No aortic stenosis is present.   Conclusion(s)/Recommendation(s): Recommend ECG gated CTA aorta for further evaluation of ascending aortic aneurysm with particular attention to the  sinus  of Valsalva.    CHA2DS2-VASc Score = 6  The patient's score is based upon: CHF History: 1 HTN History: 1 Diabetes History: 0 Stroke History: 2 (TIA) Vascular Disease History: 0 Age Score: 2 Gender Score: 0       ASSESSMENT AND PLAN: Paroxysmal Atrial Fibrillation/atrial flutter (ICD10:  I48.0) The patient's CHA2DS2-VASc score is 6, indicating a 9.7% annual risk of stroke.   S/p PVI and CTI 2014 at [facility] He is in SR today but is having more frequent palpitations. We discussed rhythm control options today. Will have him wear a 2 week Zio monitor to assess arrhythmia.  Continue Toprol  50 mg AM (two tablets) and 12.5 mg PM (1/2 tablet)  Secondary Hypercoagulable State (ICD10:  D68.69) The patient is at significant risk for stroke/thromboembolism based upon  his CHA2DS2-VASc Score of 6.  Patient is not currently on anticoagulation since his ablation in 2014. We discussed indications for anticoagulation today. Patient would like to wait for his monitor results. If afib is detected, he would be agreeable to restarting Eliquis .    HTN Stable on current regimen  OSA  Encouraged nightly CPAP  HFmrEF EF 46% GDMT per primary cardiology team Fluid status appears stable today Will consider repeat echo at follow up to help guide rhythm control options.    Follow up in the AF clinic in 4 weeks.        [facility] [address] [place], [place] [address] [phone]

## 2024-08-27 ENCOUNTER — Telehealth: Payer: Self-pay | Admitting: Cardiovascular Disease

## 2024-08-27 NOTE — Telephone Encounter (Signed)
 I called the patient and let him know what Dr. JAYSON wants to discontinue his lisinopril  and to continue to monitor his blood pressure. I told the patient to let us  know by the end of the week on how he has been feeling and to reach out before the end of week if he feels worse.

## 2024-08-27 NOTE — Telephone Encounter (Signed)
 I called the patient and he stated that he is having dizziness when he stands and walks around. He states his blood pressure runs around 106-110/60s in the morning and in the evenings they are running 120s/80s. He generally feels better in the evenings. I told him I will forward this to his provider and see if there is anything they would like to do in the meantime leading up to his appointment in November. I recommended staying hydrated and having his legs up when he sits. The patient denies any other symptoms with this dizziness but ER precautions were still given.

## 2024-08-27 NOTE — Telephone Encounter (Signed)
  Per MyChart scheduling message:  STAT if patient feels like he/she is going to faint   Are you dizzy, lightheaded, or faint now?   Have you passed out?   Do you have any other symptoms?   Have you checked your HR and BP (record if available)?    For the past several months I've been experiencing episodes of dizziness which has gotten increasingly worse. Occasionally when I stand up I have to hold on to something because I can't tell which end is up. Yesterday I walked across the room and was unable to catch me breath

## 2024-09-02 ENCOUNTER — Ambulatory Visit (HOSPITAL_COMMUNITY): Payer: Self-pay | Admitting: Physician Assistant

## 2024-09-02 NOTE — Addendum Note (Signed)
 Encounter addended by: Franchot Glade RAMAN, RN on: 09/02/2024 11:24 AM  Actions taken: Imaging Exam ended

## 2024-09-06 ENCOUNTER — Ambulatory Visit (HOSPITAL_COMMUNITY)
Admission: RE | Admit: 2024-09-06 | Discharge: 2024-09-06 | Disposition: A | Discharge status: Home / Self Care | Source: Ambulatory Visit | Attending: Physician Assistant | Admitting: Physician Assistant

## 2024-09-06 VITALS — BP 106/84 | HR 73 | Ht 77.0 in | Wt 232.6 lb

## 2024-09-06 DIAGNOSIS — D6869 Other thrombophilia: Secondary | ICD-10-CM | POA: Diagnosis present

## 2024-09-06 DIAGNOSIS — I4891 Unspecified atrial fibrillation: Secondary | ICD-10-CM | POA: Insufficient documentation

## 2024-09-06 DIAGNOSIS — I480 Paroxysmal atrial fibrillation: Secondary | ICD-10-CM | POA: Diagnosis not present

## 2024-09-06 DIAGNOSIS — I493 Ventricular premature depolarization: Secondary | ICD-10-CM | POA: Insufficient documentation

## 2024-09-06 NOTE — Progress Notes (Signed)
 Primary Care Physician: Norleen Lynwood ORN, MD Primary Cardiologist: Debby Sor, MD (Inactive) Electrophysiologist: None  Referring Physician: [facility] triage    Dustin Berry. is a 78 y.o. male with a history of HTN, HLD, TIA, CHF, OSA, aortic root aneurysm, Alzheimer's dementia, atrial fibrillation who presents for follow up in the [facility].  The patient was initially diagnosed with atrial fibrillation remotely and had a PVI and CTI at [facility] in 2014. He has been on flecainide and propafenone in the past. His anticoagulation was discontinued post ablation.     Patient returns for follow up for atrial fibrillation. He wore a 2 week cardiac monitor which showed no afib. He did have symptoms associated with PVCs and SVT. Smart watch tracings personally reviewed today which show frequent PVCs, no afib. He continues to have frequent palpitations that cause him to feel weak.   Today, he  denies symptoms of chest pain, shortness of breath, orthopnea, PND, lower extremity edema, presyncope, syncope, snoring, daytime somnolence, bleeding, or neurologic sequela. The patient is tolerating medications without difficulties and is otherwise without complaint today.    Atrial Fibrillation Risk Factors:  he does have symptoms or diagnosis of sleep apnea. he does not have a history of rheumatic fever. he does not have a history of alcohol use.   Atrial Fibrillation Management history:  Previous antiarrhythmic drugs: flecainide, propafenone Previous cardioversions: 2014 Previous ablations: PVI and CTI 2014 at [facility] Anticoagulation history: Eliquis   ROS- All systems are reviewed and negative except as per the HPI above.  Past Medical History:  Diagnosis Date   Abdominal pain 04/01/2013   Acute bronchitis 03/07/2018   Allergic rhinitis 05/12/2008   Aortic atherosclerosis 08/23/2020   Arthritis    Ascending aortic aneurysm 12/13/2018   Atrial fibrillation with  RVR 10/21/2012   Echo- EF 50-55%; mild concentric LVH; flow pattern suggestive of impaired LV relaxation; mild mitral valve prolapse, trace mitral regurgitation   Back pain    receiving PT   Benign prostatic hyperplasia 09/05/2012   Boil of buttock 06/24/2011   Bradycardia 04/17/2013   Cataract    Cellulitis of knee, left 04/12/2016   Chest pain with exertion presumed to be tachycardia related along with SOB 04/01/2013   Degeneration of lumbar intervertebral disc 12/10/2020   Eustachian tube dysfunction, bilateral 03/07/2018   External otitis of left ear 06/11/2020   Fatigue 07/04/2011   GERD (gastroesophageal reflux disease) 03/24/2009   Heart murmur    Hematuria, possible 04/01/2013   Hereditary and idiopathic peripheral neuropathy 05/12/2008   HLD (hyperlipidemia) 08/18/2007   HTN (hypertension) 12/04/2013   Insomnia 06/08/2016   Left knee pain 12/13/2018   Low back pain 12/01/2020   Lumbar spondylosis 05/05/2021   Mild cognitive impairment of uncertain or unknown etiology 12/09/2022   Mitral valve prolapse 07/04/2011   MRSA (methicillin resistant staph aureus) culture positive 5+ years ago   Neck pain 05/05/2021   OSA (obstructive sleep apnea) 07/04/2011   Using CPAP nightly   Pain in right hand 02/04/2020   Palpitations 03/06/2007   R/P MV - mild perfusion defect in basal inferoseptal, basal inferior, mid inferoseptal, and mid inferior regions, consistent w/ infarct/scar; no scintigraphic evidence of inducible myocardial ischemia; prior non transmural infarct cannot be completely excluded; EF 48%; no significant change from previous study   Personal history of colonic polyps 05/12/2008   Radicular pain in left arm 05/05/2021   Sacroiliitis 05/05/2021   Stricture and stenosis of esophagus 03/24/2009  TIA (transient ischemic attack) 11/22/2021   Tinnitus 09/05/2012   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 Swallow whole. 30 tablet 12   atorvastatin  (LIPITOR) 80 MG tablet Take 1 tablet (80 mg total) by mouth daily. 90 tablet 3   cyanocobalamin  (VITAMIN B12) 100 MCG tablet Take 100 mcg by mouth daily.     Lecanemab -irmb (LEQEMBI ) 200 MG/2ML SOLN Inject 10 mg/kg into the vein every 14 (fourteen) days.     meloxicam (MOBIC) 15 MG tablet Take 15 mg by mouth as needed for pain. TAKE 0.5-1 tablet  AS NEEDED     metoprolol  succinate (TOPROL -XL) 25 MG 24 hr tablet Take 2 tablets (50 mg total) by mouth every morning AND 0.5 tablets (12.5 mg total) every evening. (Patient taking differently: Take 2 tablets (50 mg total) by mouth every morning) 225 tablet 0   rivastigmine  (EXELON ) 3 MG capsule Take 1 capsule (3 mg total) by mouth 2 (two) times daily. 180 capsule 3   tadalafil  (CIALIS ) 5 MG tablet Take 1 tablet (5 mg total) by mouth daily. 90 tablet 3   tamsulosin  (FLOMAX ) 0.4 MG CAPS capsule Take 0.4 mg by mouth daily.     VITAMIN D  PO Take 1 tablet by mouth daily in the afternoon.     zolpidem  (AMBIEN ) 5 MG tablet TAKE 1 TABLET BY MOUTH AT BEDTIME AS NEEDED FOR SLEEP. 30 tablet 0   No current facility-administered medications for this encounter.    Physical Exam: BP 106/84   Pulse 73   Ht 6' 5 (1.956 m)   Wt 105.5 kg   BMI 27.58 kg/m   GEN: Well nourished, well developed in no acute distress CARDIAC: Regular rate and rhythm with occasional ectopy, no murmurs, rubs, gallops RESPIRATORY:  Clear to auscultation without rales, wheezing or rhonchi  ABDOMEN: Soft, non-tender, non-distended EXTREMITIES:  No edema; No deformity    Wt Readings from Last 3 Encounters:  09/06/24 105.5 kg  08/09/24 104 kg  07/22/24 104.3 kg     EKG today demonstrates  SR, PVCs Vent. rate 73 BPM PR interval 194 ms QRS duration 86 ms QT/QTcB 390/429 ms   Echo 06/28/21  demonstrated   1. Aortic dilatation noted. Aneurysm of the aortic root with  moderate-severe dilation, measuring 51 mm. There is mild dilatation of the  ascending aorta, measuring 43 mm.   2. Left ventricular ejection fraction by 3D volume is 46 %. The left  ventricle has mildly decreased function. The left ventricle demonstrates  global hypokinesis. Left ventricular diastolic parameters are consistent  with Grade I diastolic dysfunction  (impaired relaxation). The average left ventricular global longitudinal  strain is -17.9 %, borderline normal.   3. Right ventricular systolic function is normal. The right ventricular  size is normal.   4. Mild bileaflet mitral valve prolapse. The mitral valve is abnormal.  Mild to moderate mitral valve regurgitation. No evidence of mitral  stenosis.   5. The aortic valve is grossly normal. Aortic valve regurgitation is  mild. No aortic stenosis is present.   Conclusion(s)/Recommendation(s): Recommend ECG gated CTA aorta for further evaluation of ascending aortic aneurysm with particular attention to the  sinus of Valsalva.    CHA2DS2-VASc Score = 6  The patient's score is based upon: CHF History: 1 HTN History: 1  Diabetes History: 0 Stroke History: 2 (TIA) Vascular Disease History: 0 Age Score: 2 Gender Score: 0       ASSESSMENT AND PLAN: Paroxysmal Atrial Fibrillation/atrial flutter (ICD10:  I48.0) The patient's CHA2DS2-VASc score is 6, indicating a 9.7% annual risk of stroke.   S/p PVI and CTI at [facility] 2014 He remains in SR today. No afib on monitor or his Samsung watch. Continue Toprol  50 mg daily  Secondary Hypercoagulable State (ICD10:  D68.69) The patient is at significant risk for stroke/thromboembolism based upon his CHA2DS2-VASc Score of 6.  Patient is not currently on anticoagulation since his ablation in 2014. No afib on monitor or his smart watch. Would have very low threshold to resume anticoagulation given his elevated CV  score.    PVCs 6.5% burden on recent monitor Continue Toprol  50 mg daily, no room in BP to increase. He is highly symptomatic when having frequent PVCs, will have him see EP to discuss options.   HTN Stable on current regimen  OSA  Encouraged nightly CPAP  HFrecEF EF 50% on cardiac MRI at [facility] 03/2023 GDMT per primary cardiology team Fluid status appears stable today   Follow up with Dr Francyne as scheduled. Referred to EP to discuss frequent PVCs.       [facility] [address] [place], [place] [address] [phone]

## 2024-09-10 ENCOUNTER — Ambulatory Visit: Admitting: Physician Assistant

## 2024-09-23 ENCOUNTER — Ambulatory Visit: Payer: Medicare Other

## 2024-09-23 VITALS — BP 118/62 | HR 61 | Ht 76.5 in | Wt 233.6 lb

## 2024-09-23 DIAGNOSIS — Z0000 Encounter for general adult medical examination without abnormal findings: Secondary | ICD-10-CM

## 2024-09-23 DIAGNOSIS — Z23 Encounter for immunization: Secondary | ICD-10-CM

## 2024-09-23 NOTE — Patient Instructions (Addendum)
 [name],  Thank you for taking the time for your Medicare Wellness Visit. I appreciate your continued commitment to your health goals. Please review the care plan we discussed, and feel free to reach out if I can assist you further.  Medicare recommends these wellness visits once per year to help you and your care team stay ahead of potential health issues. These visits are designed to focus on prevention, allowing your provider to concentrate on managing your acute and chronic conditions during your regular appointments.  Please note that Annual Wellness Visits do not include a physical exam. Some assessments may be limited, especially if the visit was conducted virtually. If needed, we may recommend a separate in-person follow-up with your provider.  Ongoing Care Seeing your primary care provider every 3 to 6 months helps us  monitor your health and provide consistent, personalized care.   Referrals If a referral was made during today's visit and you haven't received any updates within two weeks, please contact the referred provider directly to check on the status.  Recommended Screenings:  Health Maintenance  Topic Date Due   Medicare Annual Wellness Visit  09/23/2025   Colon Cancer Screening  12/10/2027   DTaP/Tdap/Td vaccine (3 - Td or Tdap) 06/11/2029   Pneumococcal Vaccine for age over 41  Completed   Flu Shot  Completed   Hepatitis C Screening  Completed   Zoster (Shingles) Vaccine  Completed   Meningitis B Vaccine  Aged Out   COVID-19 Vaccine  Discontinued       09/23/2024    3:27 PM  Advanced Directives  Does Patient Have a Medical Advance Directive? Yes  Type of Estate agent of [place];Living will  Copy of Healthcare Power of Attorney in Chart? No - copy requested   Advance Care Planning is important because it: Ensures you receive medical care that aligns with your values, goals, and preferences. Provides guidance to your family and loved  ones, reducing the emotional burden of decision-making during critical moments.  Vision: Annual vision screenings are recommended for early detection of glaucoma, cataracts, and diabetic retinopathy. These exams can also reveal signs of chronic conditions such as diabetes and high blood pressure.  Dental: Annual dental screenings help detect early signs of oral cancer, gum disease, and other conditions linked to overall health, including heart disease and diabetes.

## 2024-09-23 NOTE — Progress Notes (Signed)
 Subjective:   Perkins Molina. is a 78 y.o. who presents for a Medicare Wellness preventive visit.  As a reminder, Annual Wellness Visits don't include a physical exam, and some assessments may be limited, especially if this visit is performed virtually. We may recommend an in-person follow-up visit with your provider if needed.  Visit Complete: In person  Persons Participating in Visit: Patient.  AWV Questionnaire: No: Patient Medicare AWV questionnaire was not completed prior to this visit.  Cardiac Risk Factors include: advanced age (>29men, >28 women);dyslipidemia;hypertension;male gender     Objective:    Today's Vitals   09/23/24 1506  BP: 118/62  Pulse: 61  SpO2: 96%  Weight: 233 lb 9.6 oz (106 kg)  Height: 6' 4.5 (1.943 m)   Body mass index is 28.06 kg/m.     09/23/2024    3:27 PM 06/03/2024    2:05 PM 09/21/2023    1:58 PM 02/19/2018    8:52 PM 06/20/2017    8:31 AM 06/05/2015    3:13 PM 04/03/2013    7:00 AM  Advanced Directives  Does Patient Have a Medical Advance Directive? Yes No Yes Yes  No  No  Patient does not have advance directive;Patient would not like information   Type of Public librarian Power of [place];Living will  Healthcare Power of [place];Living will      Does patient want to make changes to medical advance directive?    No - Patient declined      Copy of Healthcare Power of Attorney in Chart? No - copy requested  No - copy requested      Would patient like information on creating a medical advance directive?  No - Patient declined    No - patient declined information    Pre-existing out of facility DNR order (yellow form or pink MOST form)       No      Data saved with a previous flowsheet row definition    Current Medications (verified) Outpatient Encounter Medications as of 09/23/2024  Medication Sig   aspirin  81 MG EC tablet Take 1 tablet (81 mg total) by mouth daily. Swallow whole.   atorvastatin  (LIPITOR) 80 MG  tablet Take 1 tablet (80 mg total) by mouth daily.   cyanocobalamin  (VITAMIN B12) 100 MCG tablet Take 100 mcg by mouth daily.   meloxicam (MOBIC) 15 MG tablet Take 15 mg by mouth as needed for pain. TAKE 0.5-1 tablet  AS NEEDED   metoprolol  succinate (TOPROL -XL) 25 MG 24 hr tablet Take 2 tablets (50 mg total) by mouth every morning AND 0.5 tablets (12.5 mg total) every evening. (Patient taking differently: Take 2 tablets (50 mg total) by mouth every morning)   rivastigmine  (EXELON ) 3 MG capsule Take 1 capsule (3 mg total) by mouth 2 (two) times daily.   tadalafil  (CIALIS ) 5 MG tablet Take 1 tablet (5 mg total) by mouth daily.   tamsulosin  (FLOMAX ) 0.4 MG CAPS capsule Take 0.4 mg by mouth daily.   VITAMIN D  PO Take 1 tablet by mouth daily in the afternoon.   zolpidem  (AMBIEN ) 5 MG tablet TAKE 1 TABLET BY MOUTH AT BEDTIME AS NEEDED FOR SLEEP.   Lecanemab -irmb (LEQEMBI ) 200 MG/2ML SOLN Inject 10 mg/kg into the vein every 14 (fourteen) days.   No facility-administered encounter medications on file as of 09/23/2024.    Allergies (verified) Penicillin g, Quinolones, and Amoxil [amoxicillin]   History: Past Medical History:  Diagnosis Date   Abdominal pain 04/01/2013  Acute bronchitis 03/07/2018   Allergic rhinitis 05/12/2008   Aortic atherosclerosis 08/23/2020   Arthritis    Ascending aortic aneurysm 12/13/2018   Atrial fibrillation with RVR 10/21/2012   Echo- EF 50-55%; mild concentric LVH; flow pattern suggestive of impaired LV relaxation; mild mitral valve prolapse, trace mitral regurgitation   Back pain    receiving PT   Benign prostatic hyperplasia 09/05/2012   Boil of buttock 06/24/2011   Bradycardia 04/17/2013   Cataract    Cellulitis of knee, left 04/12/2016   Chest pain with exertion presumed to be tachycardia related along with SOB 04/01/2013   Degeneration of lumbar intervertebral disc 12/10/2020   Eustachian tube dysfunction, bilateral 03/07/2018   External otitis of left  ear 06/11/2020   Fatigue 07/04/2011   GERD (gastroesophageal reflux disease) 03/24/2009   Heart murmur    Hematuria, possible 04/01/2013   Hereditary and idiopathic peripheral neuropathy 05/12/2008   HLD (hyperlipidemia) 08/18/2007   HTN (hypertension) 12/04/2013   Insomnia 06/08/2016   Left knee pain 12/13/2018   Low back pain 12/01/2020   Lumbar spondylosis 05/05/2021   Mild cognitive impairment of uncertain or unknown etiology 12/09/2022   Mitral valve prolapse 07/04/2011   MRSA (methicillin resistant staph aureus) culture positive 5+ years ago   Neck pain 05/05/2021   OSA (obstructive sleep apnea) 07/04/2011   Using CPAP nightly   Pain in right hand 02/04/2020   Palpitations 03/06/2007   R/P MV - mild perfusion defect in basal inferoseptal, basal inferior, mid inferoseptal, and mid inferior regions, consistent w/ infarct/scar; no scintigraphic evidence of inducible myocardial ischemia; prior non transmural infarct cannot be completely excluded; EF 48%; no significant change from previous study   Personal history of colonic polyps 05/12/2008   Radicular pain in left arm 05/05/2021   Sacroiliitis 05/05/2021   Stricture and stenosis of esophagus 03/24/2009   TIA (transient ischemic attack) 11/22/2021   Tinnitus 09/05/2012   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   Vitamin B12 deficiency 06/15/2019   Vitamin D  deficiency 05/23/2022   Past Surgical History:  Procedure Laterality Date   ATRIAL FIBRILLATION ABLATION     CARDIOVERSION N/A 04/03/2013   Procedure: CARDIOVERSION;  Surgeon: Vinie KYM Maxcy, MD;  Location: [facility] ENDOSCOPY;  Service: Cardiovascular;  Laterality: N/A;   COLONOSCOPY  2018   HAND SURGERY     Thumb joint repair   POLYPECTOMY     TEE WITHOUT CARDIOVERSION N/A 04/03/2013   Procedure: TRANSESOPHAGEAL ECHOCARDIOGRAM (TEE);  Surgeon: Vinie KYM Maxcy, MD;  [facility] END[facility]COPY;  Service: Cardiovascular;  Laterality: N/A;   TONSILLECTOMY AND ADENOIDECTOMY     UPPER  GASTROINTESTINAL ENDOSCOPY     dilation   Family History  Problem Relation Age of Onset   Dementia Mother        late 62s   Alzheimer's disease Mother    Heart disease Father 21       died with MI   Melanoma Sister    Breast cancer Maternal Grandmother    Stroke Maternal Grandfather    Colon cancer Neg Hx    Esophageal cancer Neg Hx    Stomach cancer Neg Hx    Rectal cancer Neg Hx    Colon polyps Neg Hx    Social History   Socioeconomic History   Marital status: Married    Spouse name: Not on file   Number of children: Not on file   Years of education: 16   Highest education level: Bachelor's degree (e.g., BA, AB, BS)  Occupational History  Occupation: Engineer, site; semi-retired Firefighter, former self employed tech co support  Tobacco Use   Smoking status: Never   Smokeless tobacco: Never   Tobacco comments:    Never smoked 08/09/24  Vaping Use   Vaping status: Never Used  Substance and Sexual Activity   Alcohol use: Yes    Alcohol/week: 0.0 - 1.0 standard drinks of alcohol    Comment: infrequent glass of wine   Drug use: No   Sexual activity: Yes  Other Topics Concern   Not on file  Social History Narrative   Married   Social Drivers of Health   Financial Resource Strain: Low Risk  (09/23/2024)   Overall Financial Resource Strain (CARDIA)    Difficulty of Paying Living Expenses: Not hard at all  Food Insecurity: No Food Insecurity (09/23/2024)   Hunger Vital Sign    Worried About Running Out of Food in the Last Year: Never true    Ran Out of Food in the Last Year: Never true  Transportation Needs: No Transportation Needs (09/23/2024)   PRAPARE - Administrator, Civil Service (Medical): No    Lack of Transportation (Non-Medical): No  Physical Activity: Inactive (09/23/2024)   Exercise Vital Sign    Days of Exercise per Week: 0 days    Minutes of Exercise per Session: 0 min  Stress: No Stress Concern Present (09/23/2024)    [employer] of Occupational Health - Occupational Stress Questionnaire    Feeling of Stress: Not at all  Social Connections: Moderately Integrated (09/23/2024)   Social Connection and Isolation Panel    Frequency of Communication with Friends and Family: More than three times a week    Frequency of Social Gatherings with Friends and Family: Once a week    Attends Religious Services: Never    Database administrator or Organizations: Yes    Attends Banker Meetings: Never    Marital Status: Married    Tobacco Counseling Counseling given: Not Answered Tobacco comments: Never smoked 08/09/24    Clinical Intake:  Pre-visit preparation completed: Yes  Pain : No/denies pain     BMI - recorded: 28.06 Nutritional Status: BMI 25 -29 Overweight Nutritional Risks: None Diabetes: No  Lab Results  Component Value Date   HGBA1C 5.7 11/22/2023   HGBA1C 5.7 11/21/2022   HGBA1C 5.6 05/23/2022     How often do you need to have someone help you when you read instructions, pamphlets, or other written materials from your doctor or pharmacy?: 1 - Never  Interpreter Needed?: No  Information entered by :: Verdie Saba, CMA   Activities of Daily Living     09/23/2024    3:09 PM  In your present state of health, do you have any difficulty performing the following activities:  Hearing? 0  Comment wears hearing aids  Vision? 0  Difficulty concentrating or making decisions? 0  Walking or climbing stairs? 0  Dressing or bathing? 0  Doing errands, shopping? 0  Preparing Food and eating ? N  Using the Toilet? N  In the past six months, have you accidently leaked urine? Y  Do you have problems with loss of bowel control? N  Managing your Medications? N  Managing your Finances? N  Housekeeping or managing your Housekeeping? N    Patient Care Team: Norleen Lynwood ORN, MD as PCP - General Burnard Debby LABOR, MD (Inactive) as PCP - Cardiology (Cardiology) Burnard Debby LABOR,  MD (Inactive) as Consulting  Physician (Cardiology) Leonce Franky Dover, MD (Cardiology) Bonner Ade, MD as Consulting Physician (Physical Medicine and Rehabilitation) Debarah Lorrene DEL., MD as Consulting Physician (Ophthalmology)  I have updated your Care Teams any recent Medical Services you may have received from other providers in the past year.     Assessment:   This is a routine wellness examination for [place].  Hearing/Vision screen Hearing Screening - Comments:: Wears hearing aids Vision Screening - Comments:: Denies vision concerns    Goals Addressed               This Visit's Progress     Patient Stated (pt-stated)        Patient stated he plans to continue staying busy and keep checking his bp readings      Patient Stated (pt-stated)        Patient stated he is currently working and staying busy       Depression Screen     09/23/2024    3:10 PM 05/22/2024    8:22 AM 01/22/2024   10:14 AM 11/22/2023   10:27 AM 09/21/2023    1:57 PM 05/23/2023    9:35 AM 11/21/2022    9:54 AM  PHQ 2/9 Scores  PHQ - 2 Score 0 0 0 0 0 0 0  PHQ- 9 Score 0    0  0    Fall Risk     09/23/2024    3:09 PM 05/22/2024    8:26 AM 01/22/2024   10:17 AM 11/22/2023   10:27 AM 09/21/2023    2:00 PM  Fall Risk   Falls in the past year? 0 0 0 0 0  Number falls in past yr: 0 0 0 0 0  Injury with Fall? 0 0 0 0 0  Risk for fall due to : No Fall Risks No Fall Risks No Fall Risks No Fall Risks No Fall Risks  Follow up Falls evaluation completed;Falls prevention discussed Falls evaluation completed Falls evaluation completed Falls evaluation completed Falls prevention discussed    MEDICARE RISK AT HOME:  Medicare Risk at Home Any stairs in or around the home?: Yes If so, are there any without handrails?: No Home free of loose throw rugs in walkways, pet beds, electrical cords, etc?: Yes Adequate lighting in your home to reduce risk of falls?: Yes Life alert?: No Use of a cane, walker  or w/c?: No Grab bars in the bathroom?: No Shower chair or bench in shower?: Yes Elevated toilet seat or a handicapped toilet?: Yes  TIMED UP AND GO:  Was the test performed?  No  Cognitive Function: 6CIT completed    09/21/2023    2:00 PM  MMSE - Mini Mental State Exam  Not completed: Unable to complete      06/05/2024    1:35 PM 12/26/2023    8:45 AM 05/03/2023    3:28 PM 03/07/2022   10:21 AM  Montreal Cognitive Assessment   Visuospatial/ Executive (0/5) 5 5 5 4   Naming (0/3) 3 3 3 2   Attention: Read list of digits (0/2) 2 2 2 2   Attention: Read list of letters (0/1) 1 1 1 1   Attention: Serial 7 subtraction starting at 100 (0/3) 3 3 3 2   Language: Repeat phrase (0/2) 1 1 2 1   Language : Fluency (0/1) 1 1 1 1   Abstraction (0/2) 2 2 2 2   Delayed Recall (0/5) 0 1 3 0  Orientation (0/6) 6 6 6 6   Total 24 25 28  21  Adjusted Score (based on education)    21      09/23/2024    3:12 PM  6CIT Screen  What Year? 0 points  What month? 0 points  What time? 0 points  Count back from 20 0 points  Months in reverse 0 points  Repeat phrase 0 points  Total Score 0 points    Immunizations Immunization History  Administered Date(s) Administered   INFLUENZA, HIGH DOSE SEASONAL PF 09/05/2018, 09/18/2021, 09/23/2024   Influenza Split 09/05/2012   Influenza, Quadrivalent, Recombinant, Inj, Pf 09/02/2019   Influenza-Unspecified 11/22/2017, 10/07/2021, 10/14/2021, 10/08/2022, 11/11/2023   Moderna Covid-19 Fall Seasonal Vaccine 21yrs & older 06/30/2023   Moderna Sars-Covid-2 Vaccination 01/27/2020, 02/24/2020, 10/15/2020, 09/13/2021   PNEUMOCOCCAL CONJUGATE-20 11/13/2023   Pneumococcal Conjugate-13 04/29/2014   Pneumococcal Polysaccharide-23 09/05/2012   Td 05/14/2009   Tdap 06/12/2019   Zoster Recombinant(Shingrix) 05/05/2018, 07/05/2018   Zoster, Live 07/10/2009    Screening Tests Health Maintenance  Topic Date Due   Medicare Annual Wellness (AWV)  09/23/2025   Colonoscopy   12/10/2027   DTaP/Tdap/Td (3 - Td or Tdap) 06/11/2029   Pneumococcal Vaccine: 50+ Years  Completed   Influenza Vaccine  Completed   Hepatitis C Screening  Completed   Zoster Vaccines- Shingrix  Completed   Meningococcal B Vaccine  Aged Out   COVID-19 Vaccine  Discontinued    Health Maintenance Items Addressed:  Vaccines Given today: Influenza High-Dose   Additional Screening:  Vision Screening: Recommended annual ophthalmology exams for early detection of glaucoma and other disorders of the eye. Is the patient up to date with their annual eye exam?  Yes  Who is the provider or what is the name of the office in which the patient attends annual eye exams? [facility]  Dental Screening: Recommended annual dental exams for proper oral hygiene  Community Resource Referral / Chronic Care Management: CRR required this visit?  No   CCM required this visit?  No   Plan:    I have personally reviewed and noted the following in the patient's chart:   Medical and social history Use of alcohol, tobacco or illicit drugs  Current medications and supplements including opioid prescriptions. Patient is not currently taking opioid prescriptions. Functional ability and status Nutritional status Physical activity Advanced directives List of other physicians Hospitalizations, surgeries, and ER visits in previous 12 months Vitals Screenings to include cognitive, depression, and falls Referrals and appointments  In addition, I have reviewed and discussed with patient certain preventive protocols, quality metrics, and best practice recommendations. A written personalized care plan for preventive services as well as general preventive health recommendations were provided to patient.   Verdie CHRISTELLA Saba, CMA   09/23/2024   After Visit Summary: (In Person-Declined) Patient declined AVS at this time.  Notes: Scheduled 6-mth f/u w/PCP in 01/2025

## 2024-10-03 ENCOUNTER — Emergency Department (HOSPITAL_BASED_OUTPATIENT_CLINIC_OR_DEPARTMENT_OTHER): Admitting: Radiology

## 2024-10-03 ENCOUNTER — Other Ambulatory Visit: Payer: Self-pay

## 2024-10-03 ENCOUNTER — Emergency Department (HOSPITAL_BASED_OUTPATIENT_CLINIC_OR_DEPARTMENT_OTHER)
Admission: EM | Admit: 2024-10-03 | Discharge: 2024-10-03 | Disposition: A | Discharge status: Home / Self Care | Attending: Emergency Medicine | Admitting: Emergency Medicine

## 2024-10-03 ENCOUNTER — Encounter (HOSPITAL_BASED_OUTPATIENT_CLINIC_OR_DEPARTMENT_OTHER): Payer: Self-pay

## 2024-10-03 DIAGNOSIS — R002 Palpitations: Secondary | ICD-10-CM | POA: Diagnosis present

## 2024-10-03 DIAGNOSIS — I951 Orthostatic hypotension: Secondary | ICD-10-CM | POA: Diagnosis not present

## 2024-10-03 DIAGNOSIS — I509 Heart failure, unspecified: Secondary | ICD-10-CM | POA: Diagnosis not present

## 2024-10-03 DIAGNOSIS — Z7982 Long term (current) use of aspirin: Secondary | ICD-10-CM | POA: Diagnosis not present

## 2024-10-03 DIAGNOSIS — I110 Hypertensive heart disease with heart failure: Secondary | ICD-10-CM | POA: Diagnosis not present

## 2024-10-03 DIAGNOSIS — Z79899 Other long term (current) drug therapy: Secondary | ICD-10-CM | POA: Diagnosis not present

## 2024-10-03 DIAGNOSIS — F0280 Dementia in other diseases classified elsewhere without behavioral disturbance: Secondary | ICD-10-CM | POA: Diagnosis not present

## 2024-10-03 DIAGNOSIS — G309 Alzheimer's disease, unspecified: Secondary | ICD-10-CM | POA: Insufficient documentation

## 2024-10-03 LAB — COMPREHENSIVE METABOLIC PANEL WITH GFR
ALT: 20 U/L (ref 0–44)
AST: 23 U/L (ref 15–41)
Albumin: 4.2 g/dL (ref 3.5–5.0)
Alkaline Phosphatase: 88 U/L (ref 38–126)
Anion gap: 8 (ref 5–15)
BUN: 16 mg/dL (ref 8–23)
CO2: 26 mmol/L (ref 22–32)
Calcium: 9.7 mg/dL (ref 8.9–10.3)
Chloride: 107 mmol/L (ref 98–111)
Creatinine, Ser: 1.37 mg/dL — ABNORMAL HIGH (ref 0.61–1.24)
GFR, Estimated: 53 mL/min — ABNORMAL LOW (ref >60)
Glucose, Bld: 91 mg/dL (ref 70–99)
Potassium: 4.3 mmol/L (ref 3.5–5.1)
Sodium: 140 mmol/L (ref 135–145)
Total Bilirubin: 0.6 mg/dL (ref 0.0–1.2)
Total Protein: 6.7 g/dL (ref 6.5–8.1)

## 2024-10-03 LAB — PRO BRAIN NATRIURETIC PEPTIDE: Pro Brain Natriuretic Peptide: 480 pg/mL — ABNORMAL HIGH (ref <300.0)

## 2024-10-03 LAB — CBC
HCT: 43.8 % (ref 39.0–52.0)
Hemoglobin: 14.6 g/dL (ref 13.0–17.0)
MCH: 30.1 pg (ref 26.0–34.0)
MCHC: 33.3 g/dL (ref 30.0–36.0)
MCV: 90.3 fL (ref 80.0–100.0)
Platelets: 217 K/uL (ref 150–400)
RBC: 4.85 MIL/uL (ref 4.22–5.81)
RDW: 14 % (ref 11.5–15.5)
WBC: 4.7 K/uL (ref 4.0–10.5)
nRBC: 0 % (ref 0.0–0.2)

## 2024-10-03 LAB — TROPONIN T, HIGH SENSITIVITY
Troponin T High Sensitivity: 16 ng/L (ref 0–19)
Troponin T High Sensitivity: 15 ng/L (ref 0–19)

## 2024-10-03 LAB — MAGNESIUM: Magnesium: 1.9 mg/dL (ref 1.7–2.4)

## 2024-10-03 MED ORDER — SODIUM CHLORIDE 0.9 % IV BOLUS
1000.0000 mL | Freq: Once | INTRAVENOUS | Status: AC
  Administered 2024-10-03: 1000 mL via INTRAVENOUS

## 2024-10-03 NOTE — ED Notes (Signed)
 Reviewed AVS/discharge instructions with patient. Time allotted for and all questions answered. Patient is agreeable for d/c and escorted to ED exit by staff.

## 2024-10-03 NOTE — ED Notes (Addendum)
 Ambulated with patient. Pt reported less dizziness but did feel winded after ambulating approximately 100 feet.

## 2024-10-03 NOTE — ED Triage Notes (Signed)
 Patient reports shortness of breath that is intermittent with dizziness. Says he has a hx of arrhythmias and has appointments with cardiology but they are booked far out. He checked his heart rate on his watch and said it showed it was 37. He is unaware of the exact arrhythmia the doctors have diagnosed him with. Chart review shows paroxysmal a-fib.

## 2024-10-03 NOTE — ED Provider Notes (Signed)
 [place] EMERGENCY DEPARTMENT AT [facility] Provider Note   CSN: [id number] Arrival date & time: 10/03/24  1409     Patient presents with: Shortness of Breath   Dustin Berry. is a 78 y.o. male history of hypertension, hyperlipidemia, CHF, aortic root aneurysm, A-fib status post ablation, Alzheimer's dementia presents with complaints of palpitations.  Patient reports that he has been having worsening palpitations over the past month.  Today he was experiencing palpitations and he checked his watch and his heart rate was 38.  Does not associate with any chest pain.  Reports shortness of breath with exertion at baseline.  No worsening today.   t   Shortness of Breath      Prior to Admission medications   Medication Sig Start Date End Date Taking? Authorizing Provider  aspirin  81 MG EC tablet Take 1 tablet (81 mg total) by mouth daily. Swallow whole. 11/22/21   Norleen Lynwood ORN, MD  atorvastatin  (LIPITOR) 80 MG tablet Take 1 tablet (80 mg total) by mouth daily. 11/22/23   Norleen Lynwood ORN, MD  cyanocobalamin  (VITAMIN B12) 100 MCG tablet Take 100 mcg by mouth daily.    [provider]  Lecanemab -irmb (LEQEMBI ) 200 MG/2ML SOLN Inject 10 mg/kg into the vein every 14 (fourteen) days. 04/08/24   Camara, Amadou, MD  meloxicam (MOBIC) 15 MG tablet Take 15 mg by mouth as needed for pain. TAKE 0.5-1 tablet  AS NEEDED 05/27/20   [provider]  metoprolol  succinate (TOPROL -XL) 25 MG 24 hr tablet Take 2 tablets (50 mg total) by mouth every morning AND 0.5 tablets (12.5 mg total) every evening. Patient taking differently: Take 2 tablets (50 mg total) by mouth every morning 08/09/24   Daneen Damien BROCKS, NP  rivastigmine  (EXELON ) 3 MG capsule Take 1 capsule (3 mg total) by mouth 2 (two) times daily. 09/28/23   Gregg Lek, MD  tadalafil  (CIALIS ) 5 MG tablet Take 1 tablet (5 mg total) by mouth daily. 07/22/24   Norleen Lynwood ORN, MD  tamsulosin  (FLOMAX ) 0.4 MG CAPS capsule Take 0.4  mg by mouth daily.    [provider]  VITAMIN D  PO Take 1 tablet by mouth daily in the afternoon.    [provider]  zolpidem  (AMBIEN ) 5 MG tablet TAKE 1 TABLET BY MOUTH AT BEDTIME AS NEEDED FOR SLEEP. 08/08/21   Norleen Lynwood ORN, MD    Allergies: Penicillin g, Quinolones, and Amoxil [amoxicillin]    Review of Systems  Respiratory:  Positive for shortness of breath.     Updated Vital Signs BP (!) 154/93   Pulse (!) 59   Temp 97.9 F (36.6 C) (Oral)   Resp 20   SpO2 98%   Physical Exam Vitals and nursing note reviewed.  Constitutional:      General: He is not in acute distress.    Appearance: He is well-developed.  HENT:     Head: Normocephalic and atraumatic.  Eyes:     Conjunctiva/sclera: Conjunctivae normal.  Cardiovascular:     Rate and Rhythm: Normal rate. Rhythm irregular.     Heart sounds: No murmur heard. Pulmonary:     Effort: Pulmonary effort is normal. No respiratory distress.     Breath sounds: Normal breath sounds.  Abdominal:     Palpations: Abdomen is soft.     Tenderness: There is no abdominal tenderness.  Musculoskeletal:        General: No swelling.     Cervical back: Neck supple.  Skin:  General: Skin is warm and dry.     Capillary Refill: Capillary refill takes less than 2 seconds.  Neurological:     Mental Status: He is alert.  Psychiatric:        Mood and Affect: Mood normal.     (all labs ordered are listed, but only abnormal results are displayed) Labs Reviewed  COMPREHENSIVE METABOLIC PANEL WITH GFR - Abnormal; Notable for the following components:      Result Value   Creatinine, Ser 1.37 (*)    GFR, Estimated 53 (*)    All other components within normal limits  PRO BRAIN NATRIURETIC PEPTIDE - Abnormal; Notable for the following components:   Pro Brain Natriuretic Peptide 480.0 (*)    All other components within normal limits  CBC  MAGNESIUM  TROPONIN T, HIGH SENSITIVITY  TROPONIN T, HIGH SENSITIVITY     EKG: EKG Interpretation Date/Time:  Thursday October 03 2024 14:19:09 EDT Ventricular Rate:  71 PR Interval:  204 QRS Duration:  86 QT Interval:  388 QTC Calculation: 421 R Axis:   43  Text Interpretation: Sinus rhythm with frequent Premature ventricular complexes in a pattern of bigeminy Nonspecific ST abnormality Abnormal ECG When compared with ECG of 06-Sep-2024 09:02, No significant change was found when compared to prior, more PVC No STEMI Confirmed by Ginger Barefoot ([id number]) on 10/03/2024 5:59:03 PM  Radiology: DG Chest 2 View Result Date: 10/03/2024 CLINICAL DATA:  Shortness of breath. EXAM: CHEST - 2 VIEW COMPARISON:  04/01/2013, CT 07/12/2021 FINDINGS: The cardiomediastinal contours are normal. Calcified left hilar lymph nodes consistent with prior granulomatous disease. Benign granuloma in the left mid lung. The lungs are clear. Pulmonary vasculature is normal. No consolidation, pleural effusion, or pneumothorax. No acute osseous abnormalities are seen. IMPRESSION: 1. No acute chest findings. 2. Sequela of prior granulomatous disease. Electronically Signed   By: Andrea Gasman M.D.   On: 10/03/2024 16:38     Procedures   Medications Ordered in the ED  sodium chloride  0.9 % bolus 1,000 mL (0 mLs Intravenous Stopped 10/03/24 1722)    Clinical Course as of 10/03/24 1820  Thu Oct 03, 2024  1555 Patient with significant cardiovascular history including A-fib status post ablation presents with complaints of palpitations that occurred earlier today.  Reports that he has been having worsening palpitations over the past few months. Reports heart rate was reportedly 38 at that time.  He is hemodynamically stable upon arrival.  His EKG is consistent with sinus rhythm with bigeminy.  His troponins are without elevation.  His creatinine is mildly elevated. [JT]  1612 Orthostatics positive with drop down to 71/53 and heart rate of 35.  Will administer IV fluids [JT]  1805 Patient  reports improvement of symptoms.  He is ambulatory with maintaining adequate saturations.  No longer significantly symptomatic.  Patient will be discharged home.  Will follow-up with cardiology.  Encouraged to follow-up with PCP as well.  Strict return precautions provided. [JT]    Clinical Course User Index [JT] Donnajean Lynwood DEL, PA-C                                 Medical Decision Making Amount and/or Complexity of Data Reviewed Labs: ordered. Radiology: ordered.   This patient presents to the ED with chief complaint(s) of palpitations.  The complaint involves an extensive differential diagnosis and also carries with it a high risk of complications and morbidity.  Pertinent past medical history as listed in HPI  The differential diagnosis includes  Do not suspect ACS as patient is without any EKG changes.  Troponins are without elevation. Additional history obtained: Records reviewed Care Everywhere/External Records  Disposition:   Patient will be discharged home. The patient has been appropriately medically screened and/or stabilized in the ED. I have low suspicion for any other emergent medical condition which would require further screening, evaluation or treatment in the ED or require inpatient management. At time of discharge the patient is hemodynamically stable and in no acute distress. I have discussed work-up results and diagnosis with patient and answered all questions. Patient is agreeable with discharge plan. We discussed strict return precautions for returning to the emergency department and they verbalized understanding.     Social Determinants of Health:   none  This note was dictated with voice recognition software.  Despite best efforts at proofreading, errors may have occurred which can change the documentation meaning.       Final diagnoses:  Palpitations  Orthostatic hypotension    ED Discharge Orders     None          Donnajean Lynwood VEAR DEVONNA 10/03/24 1820    Tegeler, Lonni PARAS, MD 10/03/24 2113

## 2024-10-03 NOTE — ED Notes (Signed)
 Patient was feeling dizziness while standing BP was taken. RN Rocky was is room and is aware.

## 2024-10-03 NOTE — Discharge Instructions (Signed)
 You were evaluated in the emergency room for palpitations.  Your lab work and imaging did not show any significant abnormality.  You were found to have orthostatic hypotension.  Please continue to drink plenty of fluids and follow-up closely with your primary care doctor and cardiologist.  If you experience any new or worsening symptoms please present to [facility] emergency room where he can be evaluated by the cardiologist.

## 2024-10-15 ENCOUNTER — Ambulatory Visit: Attending: Cardiology | Admitting: Cardiology

## 2024-10-15 ENCOUNTER — Encounter: Payer: Self-pay | Admitting: Cardiology

## 2024-10-15 VITALS — BP 117/77 | HR 73 | Ht 76.5 in | Wt 234.0 lb

## 2024-10-15 DIAGNOSIS — I480 Paroxysmal atrial fibrillation: Secondary | ICD-10-CM | POA: Diagnosis not present

## 2024-10-15 DIAGNOSIS — I10 Essential (primary) hypertension: Secondary | ICD-10-CM | POA: Insufficient documentation

## 2024-10-15 DIAGNOSIS — I493 Ventricular premature depolarization: Secondary | ICD-10-CM | POA: Insufficient documentation

## 2024-10-15 DIAGNOSIS — D6869 Other thrombophilia: Secondary | ICD-10-CM | POA: Diagnosis not present

## 2024-10-15 DIAGNOSIS — R001 Bradycardia, unspecified: Secondary | ICD-10-CM | POA: Diagnosis not present

## 2024-10-15 DIAGNOSIS — G4733 Obstructive sleep apnea (adult) (pediatric): Secondary | ICD-10-CM | POA: Insufficient documentation

## 2024-10-15 MED ORDER — FLECAINIDE ACETATE 50 MG PO TABS: 50.0000 mg | ORAL_TABLET | Freq: Two times a day (BID) | ORAL | 1 refills | Status: AC

## 2024-10-15 NOTE — Patient Instructions (Signed)
 Medication Instructions:  Your physician has recommended you make the following change in your medication:   ** Begin Flecainide 50mg  - 1 tablet by mouth twice daily  *If you need a refill on your cardiac medications before your next appointment, please call your pharmacy*  Lab Work: None ordered.  If you have labs (blood work) drawn today and your tests are completely normal, you will receive your results only by: MyChart Message (if you have MyChart) OR A paper copy in the mail If you have any lab test that is abnormal or we need to change your treatment, we will call you to review the results.  Testing/Procedures: EKG in 2 weeks  Follow-Up: At [facility], you and your health needs are our priority.  As part of our continuing mission to provide you with exceptional heart care, our providers are all part of one team.  This team includes your primary Cardiologist (physician) and Advanced Practice Providers or APPs (Physician Assistants and Nurse Practitioners) who all work together to provide you with the care you need, when you need it.  Your next appointment:   3 months with Dr Inocencio APP  We recommend signing up for the patient portal called MyChart.  Sign up information is provided on this After Visit Summary.  MyChart is used to connect with patients for Virtual Visits (Telemedicine).  Patients are able to view lab/test results, encounter notes, upcoming appointments, etc.  Non-urgent messages can be sent to your provider as well.   To learn more about what you can do with MyChart, go to [email or web address].

## 2024-10-15 NOTE — Progress Notes (Signed)
 Electrophysiology Office Note:   Date:  10/15/2024  ID:  Dustin Berry Jewel Raddle., DOB 09/27/1946, MRN [id number]  Primary Cardiologist: Debby Sor, MD (Inactive) Primary Heart Failure: None Electrophysiologist: None      History of Present Illness:   Dustin Pew. is a 78 y.o. male with h/o hypertension, hyperlipidemia, TIA, CHF, sleep apnea, aortic root aneurysm, Alzheimer's, atrial fibrillation seen today for  for Electrophysiology evaluation of atrial fibrillation APCs at the request of Daril Kicks.    He was previously diagnosed with atrial fibrillation.  He had ablation at [facility] with PVI and CTI in 2014.  He has been on both flecainide and propafenone in the past.  Anticoagulation was discontinued after ablation.  He wore a 2-week monitor that recently showed no atrial fibrillation.  He did have symptoms associated with PVCs and SVT.  Smart watch tracings show frequent PVCs but no atrial fibrillation.  Discussed the use of AI scribe software for clinical note transcription with the patient, who gave verbal consent to proceed.  History of Present Illness Dustin Berry is a 78 year old male with atrial fibrillation status post-ablation who presents with frequent premature ventricular contractions and associated dizziness.  He has a history of atrial fibrillation and underwent an ablation in 2014 at [facility]. Since then, he experiences very infrequent episodes of atrial fibrillation, lasting only two to three seconds and resolving spontaneously.  Recently, he experienced a significant increase in premature ventricular contractions (PVCs), prompting a visit to the emergency room. He consistently feels PVCs, with a heart monitor indicating a PVC burden of about 6%. During the episode that led to the ER visit, he experienced severe dizziness and difficulty breathing. In the ER, his blood pressure dropped significantly upon standing, and he was treated with intravenous  fluids.  He is aware of PVCs daily. He experiences shortness of breath and dizziness, particularly when standing quickly, limiting his ability to perform tasks such as walking across a room or climbing stairs.     Review of systems complete and found to be negative unless listed in HPI.   EP Information / Studies Reviewed:    EKG is ordered today. Personal review as below.  EKG Interpretation Date/Time:  Tuesday October 15 2024 10:14:55 EST Ventricular Rate:  73 PR Interval:  214 QRS Duration:  84 QT Interval:  396 QTC Calculation: 436 R Axis:   44  Text Interpretation: Sinus rhythm with 1st degree A-V block with Premature supraventricular complexes Premature ventricular complexes Nonspecific ST abnormality When compared with ECG of 03-Oct-2024 14:19, Premature supraventricular complexes are now Present Confirmed by Dustin Berry ([id number]) on 10/15/2024 10:15:50 AM     Risk Assessment/Calculations:    CHA2DS2-VASc Score = 6   This indicates a 9.7% annual risk of stroke. The patient's score is based upon: CHF History: 1 HTN History: 1 Diabetes History: 0 Stroke History: 2 (TIA) Vascular Disease History: 0 Age Score: 2 Gender Score: 0            Physical Exam:   VS:  BP 117/77 (BP Location: Left Arm, Patient Position: Sitting, Cuff Size: Normal)   Pulse 73   Ht 6' 4.5 (1.943 m)   Wt 234 lb (106.1 kg)   SpO2 95%   BMI 28.11 kg/m    Wt Readings from Last 3 Encounters:  10/15/24 234 lb (106.1 kg)  09/23/24 233 lb 9.6 oz (106 kg)  09/06/24 232 lb 9.6 oz (105.5 kg)     GEN:  Well nourished, well developed in no acute distress NECK: No JVD; No carotid bruits CARDIAC: Regular rate and rhythm, no murmurs, rubs, gallops RESPIRATORY:  Clear to auscultation without rales, wheezing or rhonchi  ABDOMEN: Soft, non-tender, non-distended EXTREMITIES:  No edema; No deformity   ASSESSMENT AND PLAN:    1.  Paroxysmal atrial fibrillation: No atrial fibrillation based on his  smart watch.  Had CTI and PVI in 2014 at [facility].  On metoprolol .  2.  Secondary hypercoagulable state: Not anticoagulated since ablation in 2014.  Monitors via his smart watch with no episodes of atrial fibrillation.  3.  PVCs: 6.5% burden on cardiac monitor-personally reviewed.  On metoprolol .  He has symptoms of PVCs including fatigue and shortness of breath.  He also gets lightheaded.  Due to his low burden, not a candidate for ablation.  Brayley Mackowiak start flecainide 50 mg twice daily.  He Rosamary Boudreau come back in 2 weeks for an EKG.  If he is continuing to have PVCs, could increase the dose to 100 mg.  4.  Hypertension: Well-controlled  5.  Obstructive sleep apnea: CPAP compliance encouraged  6.  Dizziness/lightheadedness: Occurs when he stands up.  He received IV fluids in the emergency room with helped.  I have instructed him to increase his fluid intake.  Follow up with EP Team in 3 months  Signed, Sarinah Doetsch Gladis Norton, MD

## 2024-10-22 ENCOUNTER — Encounter: Payer: Self-pay | Admitting: Emergency Medicine

## 2024-10-22 ENCOUNTER — Ambulatory Visit: Attending: Cardiovascular Disease | Admitting: Cardiovascular Disease

## 2024-10-22 ENCOUNTER — Encounter: Payer: Self-pay | Admitting: Cardiovascular Disease

## 2024-10-22 VITALS — BP 114/80 | HR 63 | Ht 77.0 in | Wt 236.0 lb

## 2024-10-22 DIAGNOSIS — I7121 Aneurysm of the ascending aorta, without rupture: Secondary | ICD-10-CM | POA: Diagnosis not present

## 2024-10-22 DIAGNOSIS — I519 Heart disease, unspecified: Secondary | ICD-10-CM | POA: Diagnosis present

## 2024-10-22 DIAGNOSIS — R7989 Other specified abnormal findings of blood chemistry: Secondary | ICD-10-CM | POA: Diagnosis present

## 2024-10-22 DIAGNOSIS — I480 Paroxysmal atrial fibrillation: Secondary | ICD-10-CM | POA: Insufficient documentation

## 2024-10-22 DIAGNOSIS — G309 Alzheimer's disease, unspecified: Secondary | ICD-10-CM | POA: Diagnosis present

## 2024-10-22 DIAGNOSIS — R7303 Prediabetes: Secondary | ICD-10-CM | POA: Insufficient documentation

## 2024-10-22 DIAGNOSIS — F0280 Dementia in other diseases classified elsewhere without behavioral disturbance: Secondary | ICD-10-CM | POA: Diagnosis present

## 2024-10-22 DIAGNOSIS — I493 Ventricular premature depolarization: Secondary | ICD-10-CM | POA: Insufficient documentation

## 2024-10-22 DIAGNOSIS — E7800 Pure hypercholesterolemia, unspecified: Secondary | ICD-10-CM | POA: Diagnosis present

## 2024-10-22 DIAGNOSIS — Z79899 Other long term (current) drug therapy: Secondary | ICD-10-CM | POA: Insufficient documentation

## 2024-10-22 DIAGNOSIS — I2510 Atherosclerotic heart disease of native coronary artery without angina pectoris: Secondary | ICD-10-CM | POA: Insufficient documentation

## 2024-10-22 DIAGNOSIS — G4733 Obstructive sleep apnea (adult) (pediatric): Secondary | ICD-10-CM | POA: Diagnosis present

## 2024-10-22 NOTE — Progress Notes (Addendum)
 Cardiology Office Note   Date:  10/22/2024  ID:  Dustin Ina Saman Umstead., DOB [birth year], MRN [id number] PCP: Norleen Lynwood ORN, MD  [facility] Providers Cardiologist:  Debby Sor, MD (Inactive) Electrophysiologist:  Will Gladis Norton, MD   History of Present Illness Dustin Cecchi. is a 78 y.o. male with a longstanding history of atrial fibrillation going back to the 1990s s/p successful ablation 2014, no longer on anticoagulants, frequent symptomatic PVCs, moderate aortic aneurysm at the sinuses of Valsalva (most recently 4.8 cm by CT May 2025, followed at [facility] by Dr. Chad Berry), early Alzheimer's disease on lecanemab , OSA on CPAP, hyperlipidemia, transitioning cardiology care from Dr. Charlena Sor.  He was having a lot of trouble with symptomatic palpitations and dyspnea related to frequent PVCs.  He saw Dr. Norton last week and was started on treatment with flecainide 50 mg twice daily on 10/15/2024.  He has had a remarkably rapid improvement in symptoms with virtual abolition of the palpitations and complete resolution of the dyspnea.  He feels much better.  He has not had any dizzy spells or syncope.  He denies falls or bleeding problems.  He does not have chest pain at rest or with activity.  He has not had any focal neurologic complaints.  He reports that he has not had any further memory loss since starting on treatment with Leqembi .  Dr. Norleen, his primary care provider is also retiring and he will be seeing Dr. Onita going forward.  He does not have an appointment yet.  The patient's creatinine was mildly elevated at 1.37 when labs were checked 10/03/2024 at his ED evaluation for palpitations, but his usual baseline is around 1.0-1.1.  He is no longer taking NSAIDs.  Studies Reviewed     Personally viewed ECG tracing from 10/15/2024 which shows sinus rhythm with mild first-degree AV block (PR 2014 ms) and frequent premature ventricular complexes.  Narrow QRS 84 ms, normal  QTc 436 ms  Arrhythmia monitor 09/02/2024 shows high burden of PVCs at 6.5% and 7 episodes of nonsustained VT, max 22 beats in duration.  Also noted were 79 episodes of nonsustained ectopic atrial tachycardia.  Echocardiogram 06/28/2021 showed aneurysm of the aortic root measuring 51 mm and mild dilation of ascending aorta 43 mm, reportedly LVEF mildly decreased at 46%, mild bileaflet mitral valve prolapse with mild-moderate MR, normal trileaflet aortic valve with mild aortic insufficiency  Cardiac MRI [facility] 03/31/2023 shows normal-sized LV with an ejection fraction of 50%, trivial aortic insufficiency, no evidence of late gadolinium enhancement, maximum diameter at the sinuses of Valsalva 4.8 cm, mid ascending aorta 4.2 cm  CT angiogram of the aorta [facility] 04/19/2024 shows sinuses of Valsalva maximum 4.8 cm, ascending aorta maximum 4.4 cm.  This study does not mention atherosclerotic changes  03/25/2022 CT angiogram of the aorta at [facility] reports minimal atherosclerotic calcifications in the aortic arch and moderate to severe coronary artery atherosclerotic calcifications  Risk Assessment/Calculations  CHA2DS2-VASc Score = 6   This indicates a 9.7% annual risk of stroke. The patient's score is based upon: CHF History: 1 HTN History: 1 Diabetes History: 0 Stroke History: 2 (TIA) Vascular Disease History: 0 Age Score: 2 Gender Score: 0            Physical Exam VS:  BP 114/80 (BP Location: Left Arm, Patient Position: Sitting, Cuff Size: Normal)   Pulse 63   Ht 6' 5 (1.956 m)   Wt 236 lb (107 kg)   SpO2 94%  BMI 27.99 kg/m        Wt Readings from Last 3 Encounters:  10/22/24 236 lb (107 kg)  10/15/24 234 lb (106.1 kg)  09/23/24 233 lb 9.6 oz (106 kg)    GEN: Well nourished, well developed in no acute distress NECK: No JVD; No carotid bruits CARDIAC: RRR, no murmurs, rubs, gallops RESPIRATORY:  Clear to auscultation without rales, wheezing or rhonchi  ABDOMEN: Soft,  non-tender, non-distended EXTREMITIES:  No edema; No deformity   ASSESSMENT AND PLAN Hx of AFib: No recurrence in over 10 years following ablation.  No longer on anticoagulants.  Monitors rhythm conscientiously with a smart watch. PVCs: Highly symptomatic despite beta-blockers, but completely suppressed with resolution of symptoms on flecainide.  Will be seeing Dr. Inocencio back in 3 months.  Watch carefully for proarrhythmia, but so far has felt much better on the antiarrhythmic. Aortic aneurysm: Imaging studies followed by Dr. Chad Berry at [facility].  Sinuses of Valsalva maximal diameter around 4.8 cm, ascending aorta 4.2 cm, very slow worsening over the years.  He is very tall (78 inches) and his aortic dimensions are less concerning that it would be for an average height person.  Note minimal atherosclerotic calcifications in the aortic arch, no significant atherosclerotic calcifications in the abdominal aorta on chest CT from 2023. Coronary atherosclerosis: Moderate to severe coronary artery atherosclerotic calcifications are reported on the chest CT from 03/25/2022, although there was no comment on the subsequent CT from 04/19/2024.  He does not have angina pectoris, but the suspicion for significant CAD is therefore high.  He had a normal nuclear stress test a long time ago in 2008.  We discussed the potential adverse effect of flecainide on mortality if he were to have an acute coronary event.  I recommend that he undergo a cardiac PET scan. Borderline LV function: He has never had manifestations of congestive heart failure.  EF was estimated as being mildly decreased at 46% by echo, but was at low normal range at 50% by cardiac MRI performed in 2024.  proBNP was 480 just a couple of weeks ago, in normal range for his age.  So far no evidence of heart failure exacerbation after starting flecainide. HLP: All lipid parameters are excellent on the current statin treatment.  Time to  reassess. Prediabetes: 1 year ago hemoglobin A1c was 5.7%.  Will recheck today. OSA: Refer for follow-up with Dr. Randine Bihari. Abnormal creatinine: Suspect this had to do with his hydration status.  Recheck today.  Will send a copy of his labs to his new PCP, Dr. Onita.  He is taking meloxicam very sparingly.  We discussed the fact that this could have an adverse effect on his kidney function. Early Alzheimer's: Has been on Lecanemab  for the last 14 months without side effects and with apparent stabilization of his memory issues.       Dispo: Follow-up as scheduled with Dr. Inocencio in 3 months.  Follow-up with me in 1 year.  Signed, Jerel Balding, MD

## 2024-10-22 NOTE — Patient Instructions (Signed)
 Medication Instructions:  No changes *If you need a refill on your cardiac medications before your next appointment, please call your pharmacy*  Lab Work: Lipid panel, CMP, CBC If you have labs (blood work) drawn today and your tests are completely normal, you will receive your results only by: MyChart Message (if you have MyChart) OR A paper copy in the mail If you have any lab test that is abnormal or we need to change your treatment, we will call you to review the results.  Testing/Procedures: None ordered  Follow-Up: At [facility], you and your health needs are our priority.  As part of our continuing mission to provide you with exceptional heart care, our providers are all part of one team.  This team includes your primary Cardiologist (physician) and Advanced Practice Providers or APPs (Physician Assistants and Nurse Practitioners) who all work together to provide you with the care you need, when you need it.  Your next appointment:   1 year(s)  Provider:   Dr Francyne  We recommend signing up for the patient portal called MyChart.  Sign up information is provided on this After Visit Summary.  MyChart is used to connect with patients for Virtual Visits (Telemedicine).  Patients are able to view lab/test results, encounter notes, upcoming appointments, etc.  Non-urgent messages can be sent to your provider as well.   To learn more about what you can do with MyChart, go to [email or web address].

## 2024-10-22 NOTE — Progress Notes (Signed)
 I would like him to have a PET scan for coronary artery disease.  Please put in the request: High suspicion for coronary artery disease due to moderate to severe coronary artery calcifications on CT scan.

## 2024-10-23 ENCOUNTER — Ambulatory Visit: Payer: Self-pay | Admitting: Cardiovascular Disease

## 2024-10-23 DIAGNOSIS — I428 Other cardiomyopathies: Secondary | ICD-10-CM

## 2024-10-23 LAB — COMPREHENSIVE METABOLIC PANEL WITH GFR
ALT: 22 IU/L (ref 0–44)
AST: 23 IU/L (ref 0–40)
Albumin: 4.3 g/dL (ref 3.8–4.8)
Alkaline Phosphatase: 87 IU/L (ref 47–123)
BUN/Creatinine Ratio: 11 (ref 10–24)
BUN: 13 mg/dL (ref 8–27)
Bilirubin Total: 0.7 mg/dL (ref 0.0–1.2)
CO2: 23 mmol/L (ref 20–29)
Calcium: 9.6 mg/dL (ref 8.6–10.2)
Chloride: 104 mmol/L (ref 96–106)
Creatinine, Ser: 1.16 mg/dL (ref 0.76–1.27)
Globulin, Total: 2.3 g/dL (ref 1.5–4.5)
Glucose: 98 mg/dL (ref 70–99)
Potassium: 4.4 mmol/L (ref 3.5–5.2)
Sodium: 139 mmol/L (ref 134–144)
Total Protein: 6.6 g/dL (ref 6.0–8.5)
eGFR: 65 mL/min/1.73 (ref >59)

## 2024-10-23 LAB — LIPID PANEL
Chol/HDL Ratio: 2.6 ratio (ref 0.0–5.0)
Cholesterol, Total: 103 mg/dL (ref 100–199)
HDL: 39 mg/dL — ABNORMAL LOW (ref >39)
LDL Chol Calc (NIH): 46 mg/dL (ref 0–99)
Triglycerides: 90 mg/dL (ref 0–149)
VLDL Cholesterol Cal: 18 mg/dL (ref 5–40)

## 2024-10-23 LAB — CBC
Hematocrit: 46.3 % (ref 37.5–51.0)
Hemoglobin: 15.3 g/dL (ref 13.0–17.7)
MCH: 30.7 pg (ref 26.6–33.0)
MCHC: 33 g/dL (ref 31.5–35.7)
MCV: 93 fL (ref 79–97)
Platelets: 241 x10E3/uL (ref 150–450)
RBC: 4.98 x10E6/uL (ref 4.14–5.80)
RDW: 12.9 % (ref 11.6–15.4)
WBC: 4.2 x10E3/uL (ref 3.4–10.8)

## 2024-10-26 ENCOUNTER — Other Ambulatory Visit: Payer: Self-pay | Admitting: Internal Medicine

## 2024-10-28 NOTE — Addendum Note (Signed)
 Addended by: FRANCYNE HEADLAND on: 10/28/2024 11:01 AM   Modules accepted: Orders

## 2024-10-28 NOTE — Addendum Note (Signed)
 Addended by: Toneshia Coello L on: 10/28/2024 08:08 AM   Modules accepted: Orders

## 2024-10-29 ENCOUNTER — Ambulatory Visit: Payer: Self-pay | Admitting: Cardiovascular Disease

## 2024-10-29 ENCOUNTER — Other Ambulatory Visit: Payer: Self-pay

## 2024-10-29 ENCOUNTER — Ambulatory Visit: Admitting: *Deleted

## 2024-10-29 VITALS — HR 65 | Ht 77.0 in | Wt 237.7 lb

## 2024-10-29 DIAGNOSIS — I480 Paroxysmal atrial fibrillation: Secondary | ICD-10-CM | POA: Insufficient documentation

## 2024-10-29 NOTE — Progress Notes (Signed)
   Nurse Visit   Date of Encounter: 10/29/2024 ID: Dustin Ina Burns Timson., DOB October 09, 1946, MRN [id number]  PCP:  Norleen Lynwood ORN, MD   [facility] Providers Cardiologist:  Jerel Balding, MD Electrophysiologist:  Soyla Gladis Norton, MD      Visit Details   VS:  Pulse 65   Ht 6' 5 (1.956 m)   Wt 237 lb 11.2 oz (107.8 kg)   BMI 28.19 kg/m  , BMI Body mass index is 28.19 kg/m.  Wt Readings from Last 3 Encounters:  10/29/24 237 lb 11.2 oz (107.8 kg)  10/22/24 236 lb (107 kg)  10/15/24 234 lb (106.1 kg)     Reason for visit: EKG after starting flecainide  on 10/15/24 Performed today: Vitals, EKG, Provider consulted: Dr. Azobou (DOD), and Education Changes (medications, testing, etc.): No changes. Patient should continue flecainide  50 mg BID per Dr. Azobou. Length of Visit: 20 minutes    Medications Adjustments/Labs and Tests Ordered: Orders Placed This Encounter  Procedures   EKG 12-Lead    Signed, Damien JAYSON Maid, RN  10/29/2024 2:16 PM

## 2024-11-02 ENCOUNTER — Encounter: Payer: Self-pay | Admitting: Cardiovascular Disease

## 2024-11-16 ENCOUNTER — Other Ambulatory Visit: Payer: Self-pay | Admitting: Nurse Practitioner

## 2024-12-01 ENCOUNTER — Encounter: Payer: Self-pay | Admitting: Cardiovascular Disease

## 2024-12-02 ENCOUNTER — Encounter: Payer: Self-pay | Admitting: Internal Medicine

## 2024-12-03 MED ORDER — SILODOSIN 4 MG PO CAPS: 4.0000 mg | ORAL_CAPSULE | Freq: Every day | ORAL | 3 refills | Status: AC

## 2024-12-23 ENCOUNTER — Telehealth (HOSPITAL_COMMUNITY): Payer: Self-pay | Admitting: *Deleted

## 2024-12-23 NOTE — Telephone Encounter (Signed)
 Attempted to call patient regarding upcoming cardiac PET appointment. Left message on voicemail with name and callback number  Larey Brick RN Navigator Cardiac Imaging [facility] [phone] Office [phone] Cell  Reminder to avoid caffeine prior to appt.

## 2024-12-24 ENCOUNTER — Ambulatory Visit (HOSPITAL_COMMUNITY)
Admission: RE | Admit: 2024-12-24 | Discharge: 2024-12-24 | Disposition: A | Discharge status: Home / Self Care | Source: Ambulatory Visit | Attending: Cardiovascular Disease | Admitting: Cardiovascular Disease

## 2024-12-24 DIAGNOSIS — I2510 Atherosclerotic heart disease of native coronary artery without angina pectoris: Secondary | ICD-10-CM | POA: Insufficient documentation

## 2024-12-24 LAB — NM PET CT CARDIAC PERFUSION MULTI W/ABSOLUTE BLOODFLOW
LV dias vol: 163 mL (ref 62–150)
LV sys vol: 109 mL
MBFR: 2.51
Nuc Rest EF: 33 %
Nuc Stress EF: 41 %
Peak HR: 64 {beats}/min
Rest HR: 66 {beats}/min
Rest MBF: 0.75 ml/g/min
Rest Nuclear Isotope Dose: 27.6 mCi
ST Depression (mm): 0 mm
Stress MBF: 1.88 ml/g/min
Stress Nuclear Isotope Dose: 27.6 mCi

## 2024-12-24 MED ORDER — REGADENOSON 0.4 MG/5ML IV SOLN
INTRAVENOUS | Status: AC
  Filled 2024-12-24: qty 5

## 2024-12-24 MED ORDER — RUBIDIUM RB82 GENERATOR (RUBYFILL)
27.5000 | PACK | Freq: Once | INTRAVENOUS | Status: AC
  Administered 2024-12-24: 27.57 via INTRAVENOUS

## 2024-12-24 MED ORDER — RUBIDIUM RB82 GENERATOR (RUBYFILL)
27.5000 | PACK | Freq: Once | INTRAVENOUS | Status: AC
  Administered 2024-12-24: 27.63 via INTRAVENOUS

## 2024-12-24 MED ORDER — REGADENOSON 0.4 MG/5ML IV SOLN
0.4000 mg | Freq: Once | INTRAVENOUS | Status: AC
  Administered 2024-12-24: 0.4 mg via INTRAVENOUS

## 2024-12-24 NOTE — Addendum Note (Signed)
 Addended by: DAVEE IZETTA CROME on: 12/24/2024 06:00 PM   Modules accepted: Orders

## 2025-01-02 ENCOUNTER — Telehealth: Payer: Self-pay

## 2025-01-02 ENCOUNTER — Ambulatory Visit: Admitting: Neurology

## 2025-01-02 ENCOUNTER — Encounter: Payer: Self-pay | Admitting: Neurology

## 2025-01-02 VITALS — BP 107/72 | HR 71 | Ht 77.0 in | Wt 241.0 lb

## 2025-01-02 DIAGNOSIS — H9190 Unspecified hearing loss, unspecified ear: Secondary | ICD-10-CM | POA: Insufficient documentation

## 2025-01-02 DIAGNOSIS — I429 Cardiomyopathy, unspecified: Secondary | ICD-10-CM | POA: Insufficient documentation

## 2025-01-02 DIAGNOSIS — G3184 Mild cognitive impairment, so stated: Secondary | ICD-10-CM | POA: Diagnosis not present

## 2025-01-02 DIAGNOSIS — E663 Overweight: Secondary | ICD-10-CM | POA: Insufficient documentation

## 2025-01-02 DIAGNOSIS — R739 Hyperglycemia, unspecified: Secondary | ICD-10-CM | POA: Insufficient documentation

## 2025-01-02 DIAGNOSIS — I130 Hypertensive heart and chronic kidney disease with heart failure and stage 1 through stage 4 chronic kidney disease, or unspecified chronic kidney disease: Secondary | ICD-10-CM | POA: Insufficient documentation

## 2025-01-02 DIAGNOSIS — R413 Other amnesia: Secondary | ICD-10-CM | POA: Insufficient documentation

## 2025-01-02 DIAGNOSIS — N1831 Chronic kidney disease, stage 3a: Secondary | ICD-10-CM | POA: Insufficient documentation

## 2025-01-02 DIAGNOSIS — R911 Solitary pulmonary nodule: Secondary | ICD-10-CM | POA: Insufficient documentation

## 2025-01-02 DIAGNOSIS — G622 Polyneuropathy due to other toxic agents: Secondary | ICD-10-CM | POA: Insufficient documentation

## 2025-01-02 DIAGNOSIS — G309 Alzheimer's disease, unspecified: Secondary | ICD-10-CM

## 2025-01-02 DIAGNOSIS — J984 Other disorders of lung: Secondary | ICD-10-CM | POA: Insufficient documentation

## 2025-01-02 DIAGNOSIS — I2510 Atherosclerotic heart disease of native coronary artery without angina pectoris: Secondary | ICD-10-CM | POA: Insufficient documentation

## 2025-01-02 DIAGNOSIS — I493 Ventricular premature depolarization: Secondary | ICD-10-CM | POA: Insufficient documentation

## 2025-01-02 DIAGNOSIS — D179 Benign lipomatous neoplasm, unspecified: Secondary | ICD-10-CM | POA: Insufficient documentation

## 2025-01-02 DIAGNOSIS — N401 Enlarged prostate with lower urinary tract symptoms: Secondary | ICD-10-CM | POA: Insufficient documentation

## 2025-01-02 NOTE — Telephone Encounter (Signed)
 Dustin Berry

## 2025-01-02 NOTE — Patient Instructions (Signed)
 Continue with Leqembi  infusion for a total of 18 months Will discuss maintenance therapy  Follow up in 6 months or sooner if worse

## 2025-01-02 NOTE — Progress Notes (Signed)
 "  [facility]  PATIENT: Dustin Berry. DOB: [birth year]  REQUESTING CLINICIAN: Norleen Lynwood ORN, MD HISTORY FROM: Patient and Spouse  REASON FOR VISIT: Memory problem   HISTORICAL  CHIEF COMPLAINT:  Chief Complaint  Patient presents with   RM12/MEMORY    Pt is here Alone. Pt states that he feels like his memory is okay.    INTERVAL HISTORY 01/02/2025 Dustin Berry presents today for follow-up, he is alone.  Last visit was in July, since then he has been doing well, his memory stable.  He is still working.  He is tolerating his Leqembi  infusion well.  He will complete his 44-months treatment in a couple months.  Interested in maintenance therapy no other complaints or concerns.   INTERVAL HISTORY 06/05/2024:  Dustin Berry presents today for follow-up, last visit was in January, since that he has been doing well.  He continues Leqembi  infusion, his first infusion was in June 2024 and he has been getting the infusion every 2 weeks.  Tells me that he has been doing well, feels like his memory is stable.  He has cut down on the store hours, only works 2 days a week versus 50 hours a week, this is due to wife retiring and now spending more time in the shop.  Overall he does not have any additional questions or concern, the infusions are going well and he wonders what the next step after the completion of the treatment in 104-month.   Today December 21, 2023 SS: Has been on Leqembi  since 06/01/23 with infusions every 2 weeks, most recent was 12/20/23. Will need new authorization before any more infusions.  Has not had any issues, has cardiac palpations often, on metoprolol . Today MOCA 25/30. He feels memory is sharper, remembering appointments more frequently with times. Still has issues with word recall. Remains on Exelon  3 mg BID. No health changes. Drives a car, does all cooking, grocery shopping. 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 Mentions weight gain, looks  similar from Feb 2024. Wife is taking over the bills, likely due to his memory also she has accounting background. MOCA 25/30, functional activities questionnaire 0. Mentions over a year, weakness to right calf known history of peripheral neuropathy.   -MRI of the brain 07/20/2023 stable minimal chronic microvascular ischemic change, mild generalized cortical atrophy.  No ARIA. -MRI of the brain 08/22/2023 was stable, no ARIA. -MRI of the brain 11/25/2023 stable, no ARIA  INTERVAL HISTORY 05/03/2023:  Patient presents today for follow-up, last visit was in February.  Since then he reports that he has been stable.  He did follow-up with the [facility] [place]  but was not included in the clinical trial.  He is still interested in starting Leqembi .  No other complaints or concerns.  INTERVAL HISTORY 01/31/2023:  Patient presents today for follow-up, he is accompanied by wife.  He did have some questions regarding a clinical trial in [place]  Clinical Biochemist and Feasibility of Exablate Blood-Brain Barrier Disruption for Mild Cognitive Impairment or Mild Alzheimer's Disease Undergoing Standard of Care Monoclonal Antibody (mAb) Therapy).   They are requesting him to have a amyloid PET scan or a CSF amyloid test.  He is doing well with the Exelon  so far.  No new symptoms.  INTERVAL HISTORY 01/16/2023:  Patient presents today for follow-up, last visit was in July.  At that time, he followed up with Dr. Richie neuropsych, had a full test completed and he was diagnosed with mild  cognitive impairment.  He reports that his symptoms are still the same.  They are not getting worse.  Currently he is on Exelon  3 mg twice daily.  For his neuropathic pain, he did try pregabalin  but did not see any relief therefore he discontinued.  No other complaints.   INTERVAL HISTORY 06/15/2022:  Patient presents today for follow-up, he presents alone.  At last visit due to his bradycardia we defer starting Aricept or any  medications until completion of the neuropsych testing.  He reported after going home and reading about the medication, he would like to start it now.  I did inform him that his bradycardia is a relative contraindication to Aricept but we can start rivastigmine . He is comfortable with plan. His neuropsych testing is scheduled for January.  He also mentioned that since starting the pregabalin , now when walking he will have cramp-like pain in bilateral calf and his nighttime pain is not well controlled.    INTERVAL HISTORY 03/07/2022:  Patient presents today for follow-up, at last visit plan was to start pregabalin  for his neuropathic pain.  He reported the pain improved.  Today he is concerned for his memory.  He reported he has issue with recall, his long-term memory and short-term memory are intact.  Wife thinks he does not do all the tasks he supposed to do, he does forget a lot.  There was time at home where he forget and left the stove on, burning some pots.  He still drives, does not have any recent accident or being lost in family or places.  He reported mother had a history of Alzheimer's disease, diagnosed in the 79s.  He still independent, able to for perform all ADLs and a IADLs.  Denies any word finding difficulty denies forgetting names of family members.  Wife took over the finances a few years ago because he did forget to pay bills on time.   HISTORY OF PRESENT ILLNESS:  This is a 79 year old gentleman with past medical history of hypertension hyperlipidemia, previously atrial fibrillation status post ablation who was referred by PMD for TIA work up.  Patient reports 6 weeks ago while at a computer desk he noted  binocular double vision, on closing 1 eye his vision will be back to normal but with both eyes he will have double vision.  The entire episode lasted about 3 minutes then resolved.  There were no associated symptoms with the blurry vision, denies any headaches, denies any numbness, no  weakness and no slurred speech.  He never experienced an episode like that in the past and has not had any additional episodes.  He did follow-up with his primary care doctor who obtained a stroke labs and refer him to neurology for TIA work-up.  His lipid panel was within normal limits and a hemoglobin A1c was 5.7.  He denies any previous history of strokes.     OTHER MEDICAL CONDITIONS: HLD, HTN, CAD   REVIEW OF SYSTEMS: Full 14 system review of systems performed and negative with exception of: as noted in the HPI   ALLERGIES: Allergies  Allergen Reactions   Penicillin G Rash   Quinolones Other (See Comments)    Other reaction(s): Other (See Comments) Fluroquinolone antibiotics should be avoided in patients with history of aortic aneurysm/dissection   Amoxil [Amoxicillin] Rash    HOME MEDICATIONS: Outpatient Medications Prior to Visit  Medication Sig Dispense Refill   aspirin  81 MG EC tablet Take 1 tablet (81 mg total) by mouth daily.  Swallow whole. 30 tablet 12   atorvastatin  (LIPITOR) 80 MG tablet TAKE 1 TABLET BY MOUTH EVERY DAY 90 tablet 3   cyanocobalamin  (VITAMIN B12) 100 MCG tablet Take 100 mcg by mouth daily.     flecainide  (TAMBOCOR ) 50 MG tablet Take 1 tablet (50 mg total) by mouth 2 (two) times daily. 180 tablet 1   Lecanemab -irmb (LEQEMBI ) 200 MG/2ML SOLN Inject 10 mg/kg into the vein every 14 (fourteen) days.     metoprolol  succinate (TOPROL -XL) 25 MG 24 hr tablet TAKE 2 TABLETS (50 MG TOTAL) BY MOUTH EVERY MORNING AND 0.5 TABLETS (12.5 MG TOTAL) EVERY EVENING. 225 tablet 3   rivastigmine  (EXELON ) 3 MG capsule Take 1 capsule (3 mg total) by mouth 2 (two) times daily. 180 capsule 3   VITAMIN D  PO Take 1 tablet by mouth daily in the afternoon.     zolpidem  (AMBIEN ) 5 MG tablet TAKE 1 TABLET BY MOUTH AT BEDTIME AS NEEDED FOR SLEEP. 30 tablet 0   meloxicam (MOBIC) 15 MG tablet Take 15 mg by mouth as needed for pain. TAKE 0.5-1 tablet  AS NEEDED (Patient not taking: Reported  on 01/02/2025)     silodosin  (RAPAFLO ) 4 MG CAPS capsule Take 1 capsule (4 mg total) by mouth daily with breakfast. (Patient not taking: Reported on 01/02/2025) 90 capsule 3   tamsulosin  (FLOMAX ) 0.4 MG CAPS capsule Take 0.4 mg by mouth daily. (Patient not taking: Reported on 01/02/2025)     No facility-administered medications prior to visit.    PAST MEDICAL HISTORY: Past Medical History:  Diagnosis Date   Abdominal pain 04/01/2013   Acute bronchitis 03/07/2018   Allergic rhinitis 05/12/2008   Aortic atherosclerosis 08/23/2020   Arthritis    Ascending aortic aneurysm 12/13/2018   Atrial fibrillation with RVR 10/21/2012   Echo- EF 50-55%; mild concentric LVH; flow pattern suggestive of impaired LV relaxation; mild mitral valve prolapse, trace mitral regurgitation   Back pain    receiving PT   Benign prostatic hyperplasia 09/05/2012   Boil of buttock 06/24/2011   Bradycardia 04/17/2013   Cataract    Cellulitis of knee, left 04/12/2016   Chest pain with exertion presumed to be tachycardia related along with SOB 04/01/2013   Degeneration of lumbar intervertebral disc 12/10/2020   Eustachian tube dysfunction, bilateral 03/07/2018   External otitis of left ear 06/11/2020   Fatigue 07/04/2011   GERD (gastroesophageal reflux disease) 03/24/2009   Heart murmur    Hematuria, possible 04/01/2013   Hereditary and idiopathic peripheral neuropathy 05/12/2008   HLD (hyperlipidemia) 08/18/2007   HTN (hypertension) 12/04/2013   Insomnia 06/08/2016   Left knee pain 12/13/2018   Low back pain 12/01/2020   Lumbar spondylosis 05/05/2021   Mild cognitive impairment of uncertain or unknown etiology 12/09/2022   Mitral valve prolapse 07/04/2011   MRSA (methicillin resistant staph aureus) culture positive 5+ years ago   Neck pain 05/05/2021   OSA (obstructive sleep apnea) 07/04/2011   Using CPAP nightly   Pain in right hand 02/04/2020   Palpitations 03/06/2007   R/P MV - mild perfusion defect in  basal inferoseptal, basal inferior, mid inferoseptal, and mid inferior regions, consistent w/ infarct/scar; no scintigraphic evidence of inducible myocardial ischemia; prior non transmural infarct cannot be completely excluded; EF 48%; no significant change from previous study   Personal history of colonic polyps 05/12/2008   Radicular pain in left arm 05/05/2021   Sacroiliitis 05/05/2021   Stricture and stenosis of esophagus 03/24/2009   TIA (transient ischemic attack)  11/22/2021   Tinnitus 09/05/2012   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   Vitamin B12 deficiency 06/15/2019   Vitamin D  deficiency 05/23/2022    PAST SURGICAL HISTORY: Past Surgical History:  Procedure Laterality Date   ATRIAL FIBRILLATION ABLATION     CARDIOVERSION N/A 04/03/2013   Procedure: CARDIOVERSION;  Surgeon: Vinie KYM Maxcy, MD;  Location: [facility] ENDOSCOPY;  Service: Cardiovascular;  Laterality: N/A;   COLONOSCOPY  2018   HAND SURGERY     Thumb joint repair   POLYPECTOMY     TEE WITHOUT CARDIOVERSION N/A 04/03/2013   Procedure: TRANSESOPHAGEAL ECHOCARDIOGRAM (TEE);  Surgeon: Vinie KYM Maxcy, MD;  Location: [facility] ENDOSCOPY;  Service: Cardiovascular;  Laterality: N/A;   TONSILLECTOMY AND ADENOIDECTOMY     UPPER GASTROINTESTINAL ENDOSCOPY     dilation    FAMILY HISTORY: Family History  Problem Relation Age of Onset   Dementia Mother        late 46s   Alzheimer's disease Mother    Heart disease Father 69       died with MI   Melanoma Sister    Breast cancer Maternal Grandmother    Stroke Maternal Grandfather    Colon cancer Neg Hx    Esophageal cancer Neg Hx    Stomach cancer Neg Hx    Rectal cancer Neg Hx    Colon polyps Neg Hx     SOCIAL HISTORY: Social History   Socioeconomic History   Marital status: Married    Spouse name: Not on file   Number of children: Not on file   Years of education: 16   Highest education level: Bachelor's degree (e.g., BA, AB, BS)  Occupational History   Occupation: Retail     Comment: Retail; semi-retired firefighter, former self employed tech co support  Tobacco Use   Smoking status: Never   Smokeless tobacco: Never   Tobacco comments:    Never smoked 08/09/24  Vaping Use   Vaping status: Never Used  Substance and Sexual Activity   Alcohol use: Yes    Alcohol/week: 0.0 - 1.0 standard drinks of alcohol    Comment: infrequent glass of wine   Drug use: No   Sexual activity: Yes  Other Topics Concern   Not on file  Social History Narrative   Married   Social Drivers of Health   Tobacco Use: Low Risk (01/02/2025)   Patient History    Smoking Tobacco Use: Never    Smokeless Tobacco Use: Never    Passive Exposure: Not on file  Financial Resource Strain: Low Risk (09/23/2024)   Overall Financial Resource Strain (CARDIA)    Difficulty of Paying Living Expenses: Not hard at all  Food Insecurity: No Food Insecurity (09/23/2024)   Epic    Worried About Programme Researcher, Broadcasting/film/video in the Last Year: Never true    Ran Out of Food in the Last Year: Never true  Transportation Needs: No Transportation Needs (09/23/2024)   Epic    Lack of Transportation (Medical): No    Lack of Transportation (Non-Medical): No  Physical Activity: Inactive (09/23/2024)   Exercise Vital Sign    Days of Exercise per Week: 0 days    Minutes of Exercise per Session: 0 min  Stress: No Stress Concern Present (09/23/2024)   [employer] of Occupational Health - Occupational Stress Questionnaire    Feeling of Stress: Not at all  Social Connections: Moderately Integrated (09/23/2024)   Social Connection and Isolation Panel    Frequency of Communication with Friends and Family:  More than three times a week    Frequency of Social Gatherings with Friends and Family: Once a week    Attends Religious Services: Never    Database Administrator or Organizations: Yes    Attends Banker Meetings: Never    Marital Status: Married  Catering Manager Violence: Not At Risk  (09/23/2024)   Epic    Fear of Current or Ex-Partner: No    Emotionally Abused: No    Physically Abused: No    Sexually Abused: No  Depression (PHQ2-9): Low Risk (09/23/2024)   Depression (PHQ2-9)    PHQ-2 Score: 0  Alcohol Screen: Low Risk (09/23/2024)   Alcohol Screen    Last Alcohol Screening Score (AUDIT): 0  Housing: Unknown (09/23/2024)   Epic    Unable to Pay for Housing in the Last Year: No    Number of Times Moved in the Last Year: Not on file    Homeless in the Last Year: No  Utilities: Not At Risk (09/23/2024)   Epic    Threatened with loss of utilities: No  Health Literacy: Adequate Health Literacy (09/23/2024)   B1300 Health Literacy    Frequency of need for help with medical instructions: Never     PHYSICAL EXAM  GENERAL EXAM/CONSTITUTIONAL: Vitals:  Vitals:   01/02/25 1020  BP: 107/72  Pulse: 71  Weight: 241 lb (109.3 kg)  Height: 6' 5 (1.956 m)       Body mass index is 28.58 kg/m. Wt Readings from Last 3 Encounters:  01/02/25 241 lb (109.3 kg)  10/29/24 237 lb 11.2 oz (107.8 kg)  10/22/24 236 lb (107 kg)   Patient is in no distress; well developed, nourished and groomed; neck is supple  MUSCULOSKELETAL: Gait, strength, tone, movements noted in Neurologic exam below  NEUROLOGIC: MENTAL STATUS:     09/21/2023    2:00 PM  MMSE - Mini Mental State Exam  Not completed: Unable to complete   awake, alert, oriented to person, place and time recent and remote memory intact normal attention and concentration language fluent, comprehension intact, naming intact fund of knowledge appropriate     01/02/2025   10:37 AM 06/05/2024    1:35 PM 12/26/2023    8:45 AM 05/03/2023    3:28 PM 03/07/2022   10:21 AM  Montreal Cognitive Assessment   Visuospatial/ Executive (0/5) 5 5 5 5 4   Naming (0/3) 3 3 3 3 2   Attention: Read list of digits (0/2) 2 2 2 2 2   Attention: Read list of letters (0/1) 1 1 1 1 1   Attention: Serial 7 subtraction starting at  100 (0/3) 3 3 3 3 2   Language: Repeat phrase (0/2) 1 1 1 2 1   Language : Fluency (0/1) 1 1 1 1 1   Abstraction (0/2) 2 2 2 2 2   Delayed Recall (0/5) 2 0 1 3 0  Orientation (0/6) 6 6 6 6 6   Total 26 24 25 28 21   Adjusted Score (based on education)     21    CRANIAL NERVE:  2nd, 3rd, 4th, 6th - visual fields full to confrontation, extraocular muscles intact, no nystagmus 5th - facial sensation symmetric 7th - facial strength symmetric 8th - hearing intact 11th - shoulder shrug symmetric  MOTOR:  normal bulk and tone, full strength in the BUE, BLE. No atrophy noted to right calf.   SENSORY:  normal and symmetric to light touch  COORDINATION:  finger-nose-finger, fine finger movements normal  GAIT/STATION:  Normal, slight limp with the right  DIAGNOSTIC DATA (LABS, IMAGING, TESTING) - I reviewed patient records, labs, notes, testing and imaging myself where available.  Lab Results  Component Value Date   WBC 4.2 10/22/2024   HGB 15.3 10/22/2024   HCT 46.3 10/22/2024   MCV 93 10/22/2024   PLT 241 10/22/2024      Component Value Date/Time   NA 139 10/22/2024 1036   K 4.4 10/22/2024 1036   CL 104 10/22/2024 1036   CO2 23 10/22/2024 1036   GLUCOSE 98 10/22/2024 1036   GLUCOSE 91 10/03/2024 1428   BUN 13 10/22/2024 1036   CREATININE 1.16 10/22/2024 1036   CREATININE 1.13 03/01/2017 0001   CALCIUM  9.6 10/22/2024 1036   PROT 6.6 10/22/2024 1036   ALBUMIN 4.3 10/22/2024 1036   AST 23 10/22/2024 1036   ALT 22 10/22/2024 1036   ALKPHOS 87 10/22/2024 1036   BILITOT 0.7 10/22/2024 1036   GFRNONAA 53 (L) 10/03/2024 1428   GFRAA 75 09/18/2020 1038   Lab Results  Component Value Date   CHOL 103 10/22/2024   HDL 39 (L) 10/22/2024   LDLCALC 46 10/22/2024   TRIG 90 10/22/2024   CHOLHDL 2.6 10/22/2024   Lab Results  Component Value Date   HGBA1C 5.7 11/22/2023   Lab Results  Component Value Date   VITAMINB12 1,179 (H) 11/22/2023   Lab Results  Component Value  Date   TSH 2.07 11/22/2023   MRI Brain 11/25/2023 MRI scan of the brain with and without contrast showing only mild age-related generalized cerebral atrophy. Overall no significant change compared to previous MRI dated 08/22/2023   MRI Brain wo contrast 12/29/2021 1. No acute intracranial pathology.  No evidence of recent infarct. 2. Mild parenchymal volume loss and chronic white matter microangiopathy  PET Amyloid 04/06/2023 Scan is POSITIVE for brain amyloid and is most consistent with the presence of moderate to frequent neuritic beta-amyloid plaques in the brain.  ASSESSMENT AND PLAN  79 y.o. year old male with atrial fibrillation status post ablation, hypertension, hyperlipidemia, mild cognitive impairment due to AD who is presenting for follow up.  His ATN profile was postive for AD biomarker's.  His amyloid PET scan was also positive.  On Leqembi  since 06/01/23.  Has been receiving infusions every 2 weeks. Serial MRI imaging has been negative for ARIA.  Today, MoCA 24/30.  He has tolerated Leqembi  well.  -Overall, seems to have remained fairly stable. Remains independent, drives car, working part time.  -Will continue Leqembi  every 2 weeks x 18 months total -Remains on Exelon  3 mg twice daily -Encourage exercise, brain stimulating activity, healthy eating, management of vascular risk factors -We discussed weakness to right calf, he will start physical therapy  -Follow-up in 6 months   Pastor Falling, MD  [facility] [address], [address] [place], [place] [address] [phone]  "

## 2025-01-07 ENCOUNTER — Other Ambulatory Visit: Payer: Self-pay | Admitting: Internal Medicine

## 2025-01-07 DIAGNOSIS — R911 Solitary pulmonary nodule: Secondary | ICD-10-CM

## 2025-01-08 ENCOUNTER — Inpatient Hospital Stay: Admission: RE | Admit: 2025-01-08 | Discharge: 2025-01-08 | Attending: Internal Medicine | Admitting: Internal Medicine

## 2025-01-08 DIAGNOSIS — R911 Solitary pulmonary nodule: Secondary | ICD-10-CM

## 2025-01-15 ENCOUNTER — Ambulatory Visit: Admitting: Physician Assistant

## 2025-01-23 ENCOUNTER — Ambulatory Visit: Admitting: Internal Medicine

## 2025-01-27 ENCOUNTER — Ambulatory Visit (HOSPITAL_COMMUNITY)

## 2025-07-03 ENCOUNTER — Ambulatory Visit: Admitting: Neurology

## 2025-10-06 ENCOUNTER — Ambulatory Visit

## 2025-10-06 ENCOUNTER — Encounter: Admitting: Internal Medicine
